# Patient Record
Sex: Female | Born: 1945 | Race: White | Hispanic: No | Marital: Married | Smoking: Former smoker
Health system: Southern US, Community
[De-identification: ages and names within clinical notes are randomized; demographics above are authoritative.]

## PROBLEM LIST (undated history)

## (undated) DIAGNOSIS — I10 Essential (primary) hypertension: Secondary | ICD-10-CM

## (undated) DIAGNOSIS — E119 Type 2 diabetes mellitus without complications: Secondary | ICD-10-CM

## (undated) DIAGNOSIS — IMO0002 Reserved for concepts with insufficient information to code with codable children: Secondary | ICD-10-CM

## (undated) DIAGNOSIS — C801 Malignant (primary) neoplasm, unspecified: Secondary | ICD-10-CM

## (undated) DIAGNOSIS — F329 Major depressive disorder, single episode, unspecified: Secondary | ICD-10-CM

## (undated) DIAGNOSIS — F32A Depression, unspecified: Secondary | ICD-10-CM

## (undated) DIAGNOSIS — E039 Hypothyroidism, unspecified: Secondary | ICD-10-CM

## (undated) DIAGNOSIS — E785 Hyperlipidemia, unspecified: Secondary | ICD-10-CM

## (undated) DIAGNOSIS — H409 Unspecified glaucoma: Secondary | ICD-10-CM

## (undated) DIAGNOSIS — D649 Anemia, unspecified: Secondary | ICD-10-CM

## (undated) HISTORY — PX: CHOLECYSTECTOMY: SHX55

---

## 2003-10-24 IMAGING — MG UNKNOWN MG STUDY
1 series · 4 of 4 positions shown · non-contrast
Comparison: none

REASON FOR EXAM: screening

Procedure: DIGITAL SCREENING MAMMOGRAM WITH CAD:

[R CC · right · 4 of 4 slices shown]
[im 1/4]
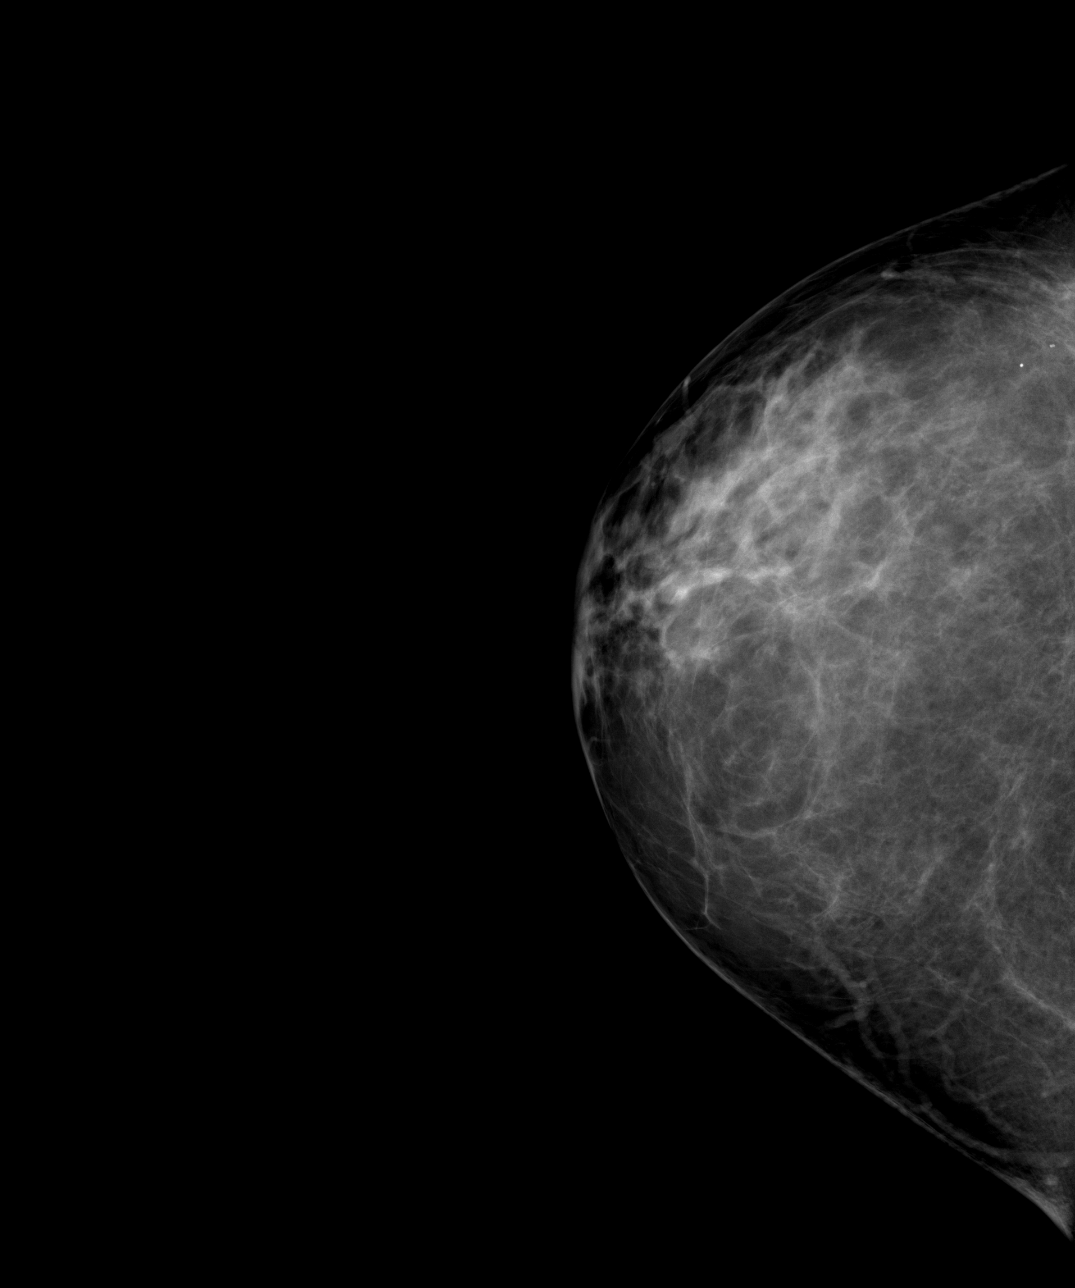
[im 2/4]
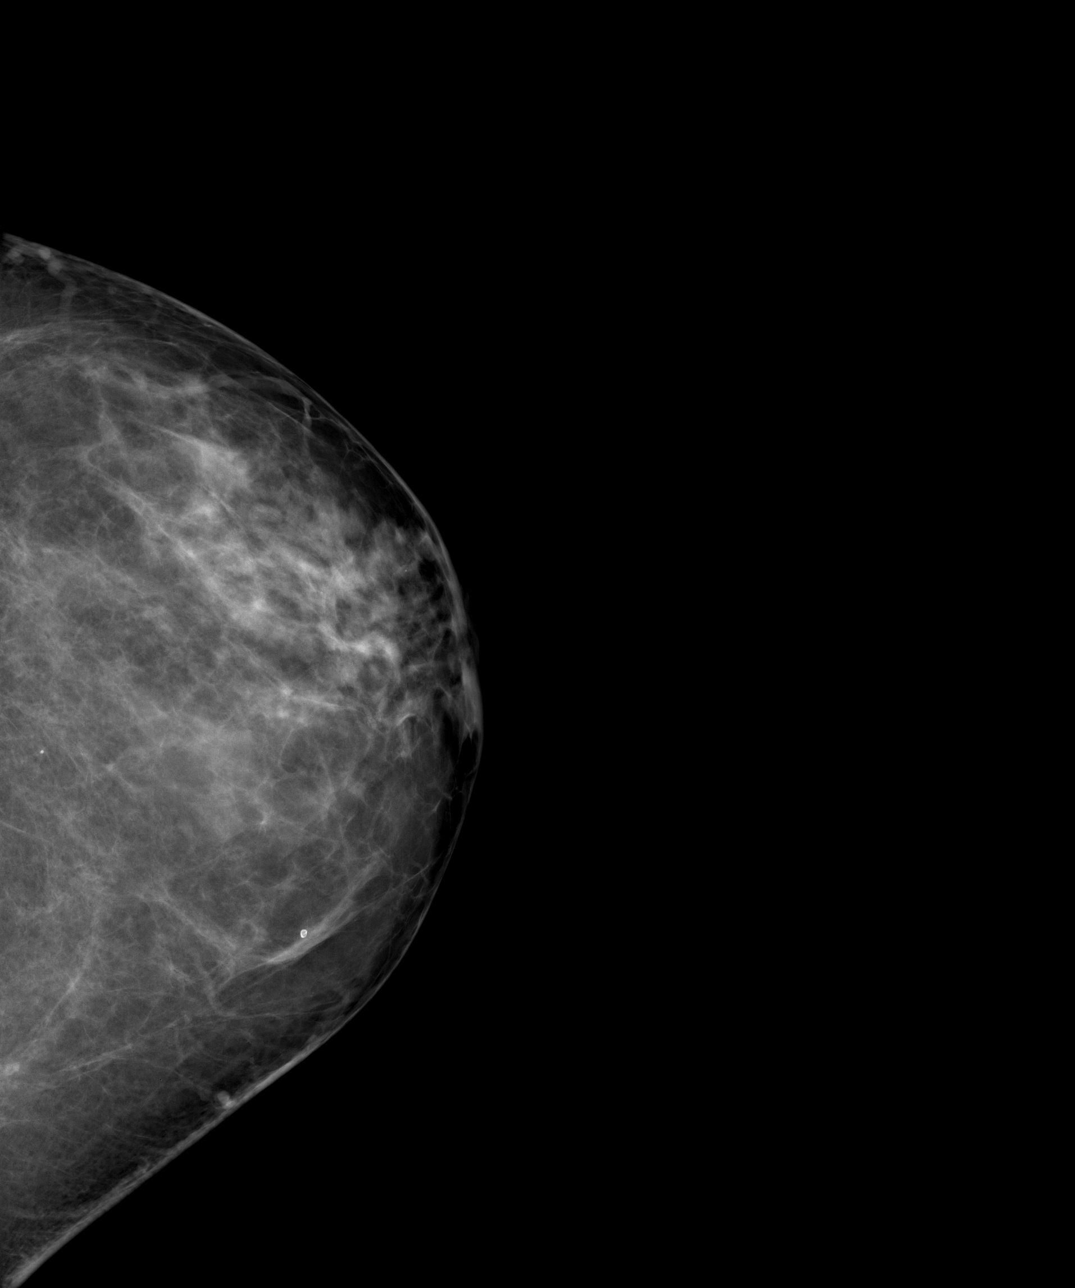
[im 3/4]
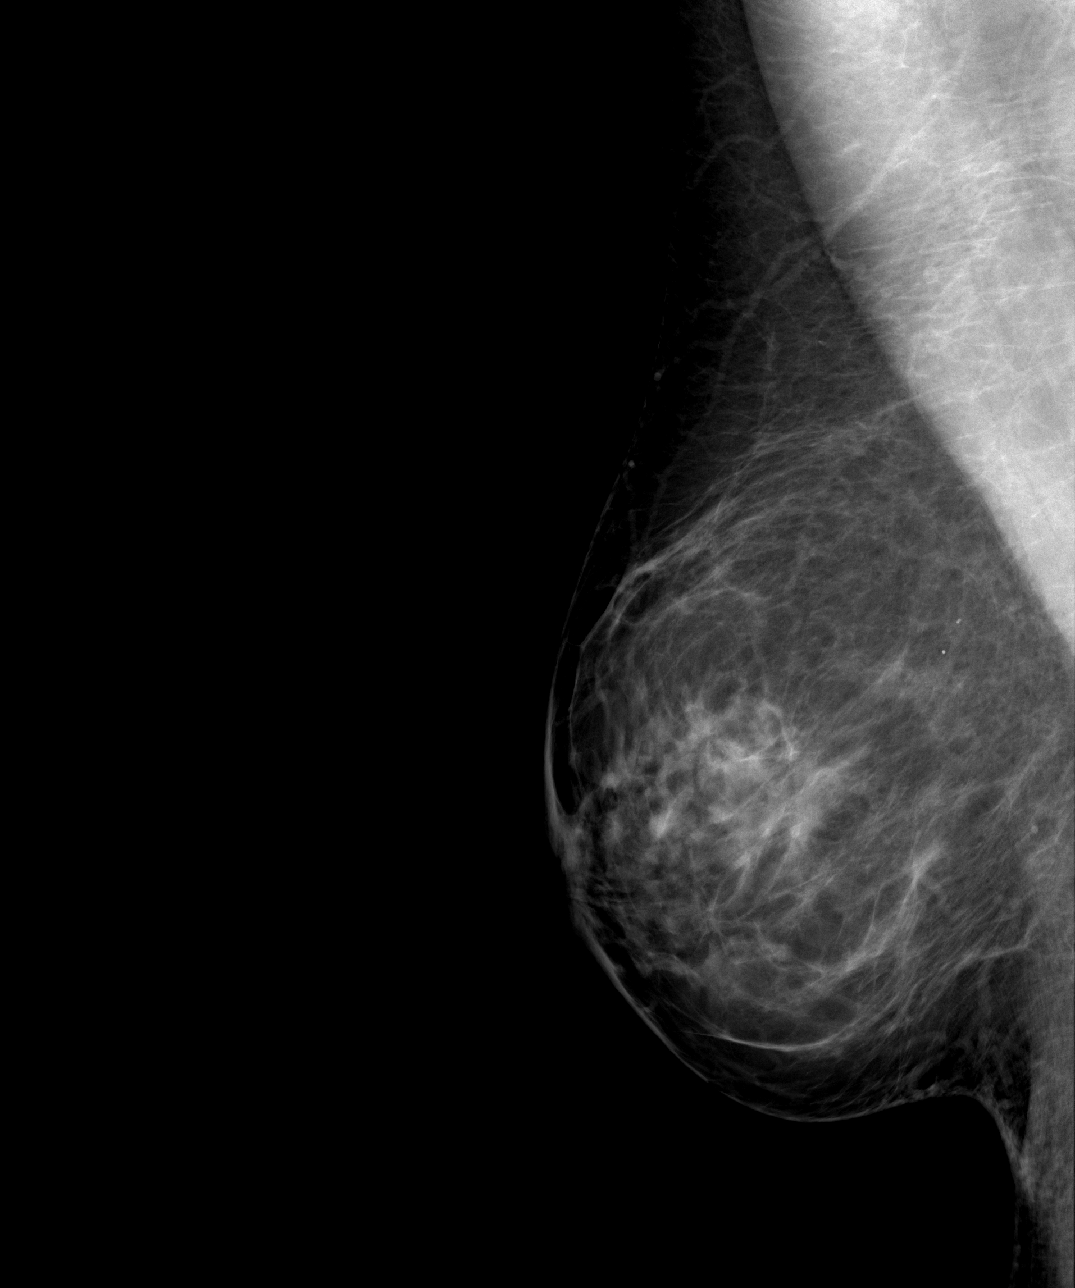
[im 4/4]
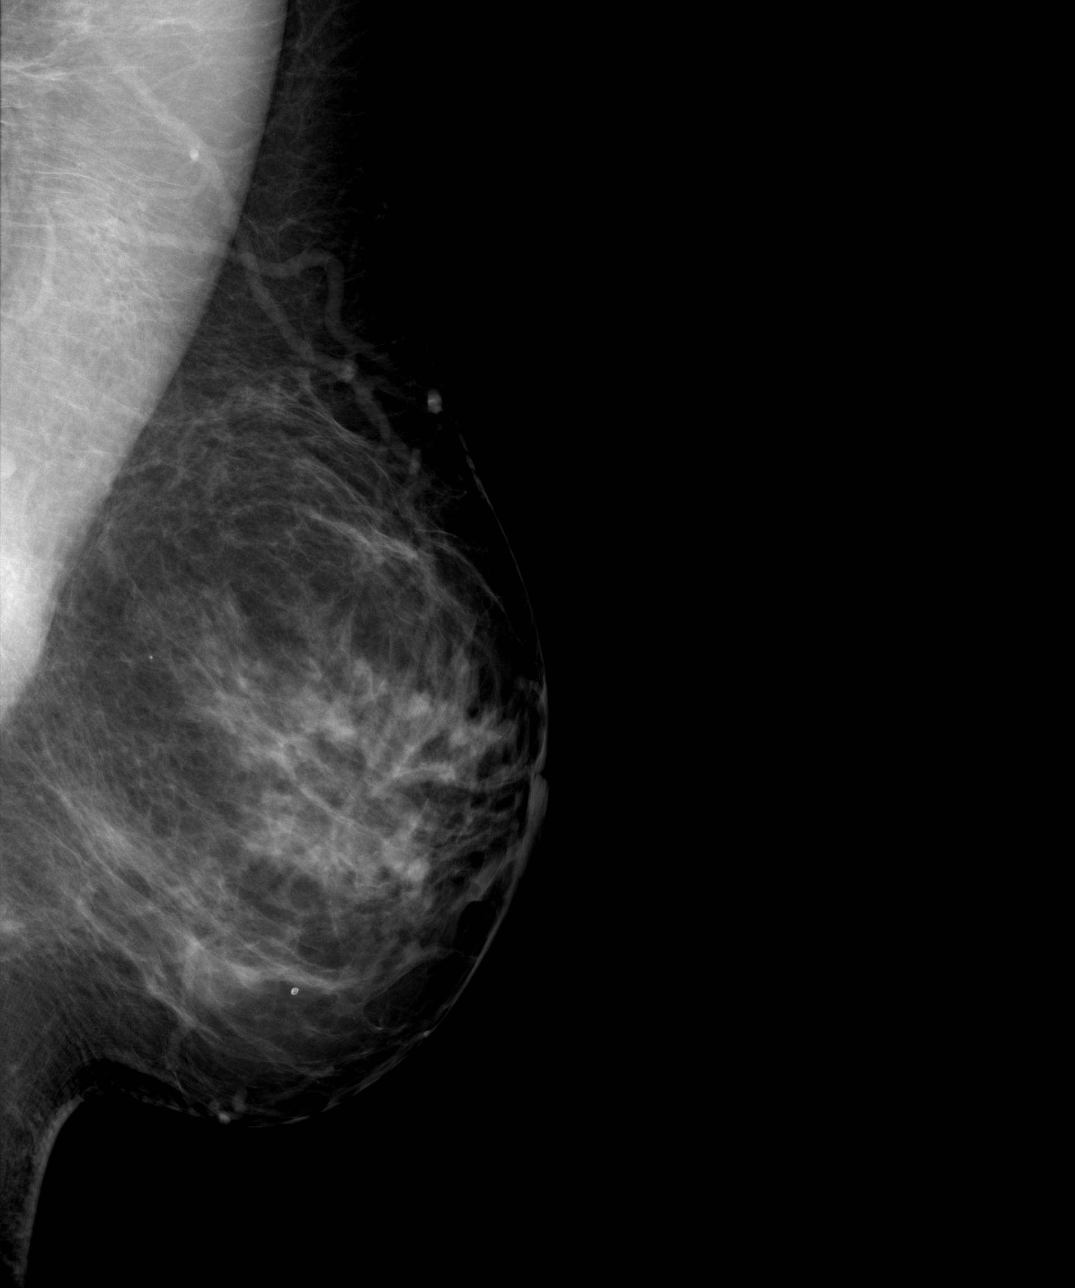

[4 of 4 positions shown; findings below may reference images not displayed]

FINDINGS: Comparison is made to the study of [DATE] and [DATE]. The
breasts exhibit a moderately dense parenchymal pattern. There is no evidence
of a developing density, dominant mass, or malignant appearing
calcification. Stable, benign appearing areas of calcification are present
bilaterally.
IMPRESSION: 1)Stable, benign appearing bilateral mammogram.

 BI-RADS: Category 2-Benign Finding

 RECOMMENDATIONS:

 1)Please continue to encourage yearly mammographic follow-up.

 A NEGATIVE MAMMOGRAM REPORT DOES NOT PRECLUDE BIOPSY OR OTHER EVALUATION OF
A CLINICALLY PALPABLE OR OTHERWISE SUSPICIOUS MASS OR LESION. BREAST CANCER
MAY NOT BE DETECTED BY MAMMOGRAPHY IN UP TO 10% OF CASES.

## 2005-09-02 ENCOUNTER — Ambulatory Visit: Payer: Self-pay | Admitting: Family Medicine

## 2007-08-27 ENCOUNTER — Ambulatory Visit: Payer: Self-pay | Admitting: *Deleted

## 2007-09-03 ENCOUNTER — Ambulatory Visit: Payer: Self-pay | Admitting: *Deleted

## 2007-09-10 ENCOUNTER — Ambulatory Visit: Payer: Self-pay | Admitting: *Deleted

## 2007-09-15 ENCOUNTER — Ambulatory Visit: Payer: Self-pay | Admitting: Psychology

## 2007-09-22 ENCOUNTER — Ambulatory Visit: Payer: Self-pay | Admitting: *Deleted

## 2007-10-01 ENCOUNTER — Ambulatory Visit: Payer: Self-pay | Admitting: *Deleted

## 2007-10-06 ENCOUNTER — Ambulatory Visit: Payer: Self-pay | Admitting: *Deleted

## 2009-06-06 ENCOUNTER — Ambulatory Visit: Payer: Self-pay | Admitting: Family Medicine

## 2009-08-02 ENCOUNTER — Ambulatory Visit: Payer: Self-pay | Admitting: Gastroenterology

## 2011-03-20 ENCOUNTER — Ambulatory Visit: Payer: Self-pay | Admitting: Family Medicine

## 2011-03-20 IMAGING — MG MAM DGTL SCRN MAM NO ORDER W/CAD
1 series · 4 of 4 positions shown · non-contrast
Comparison: none

REASON FOR EXAM: scr mammo no order
COMMENTS:

[Series 9392: R CC · right · 4 of 4 slices shown]
[im 1/4]
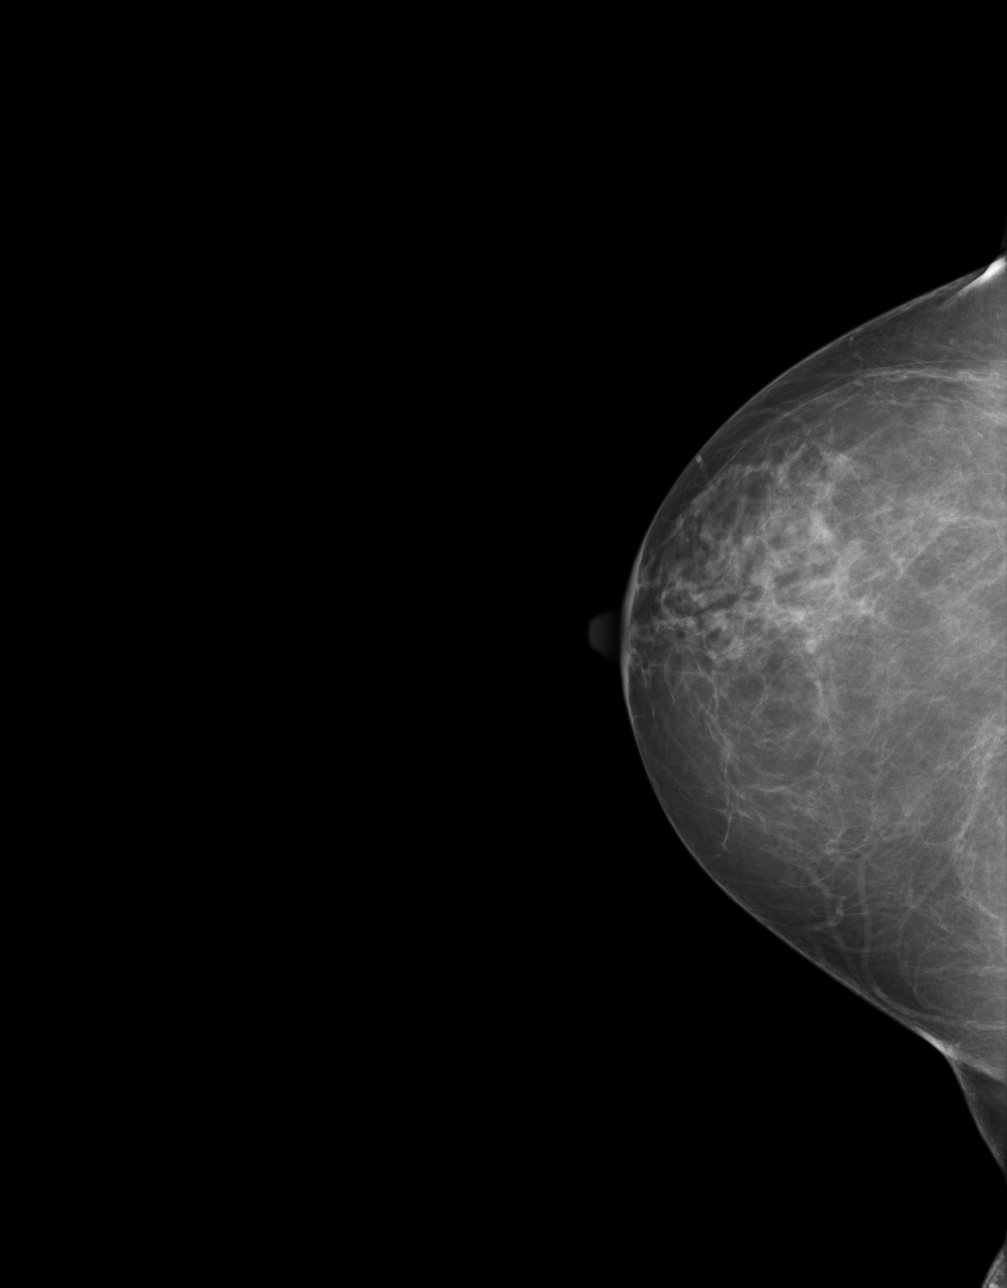
[im 2/4]
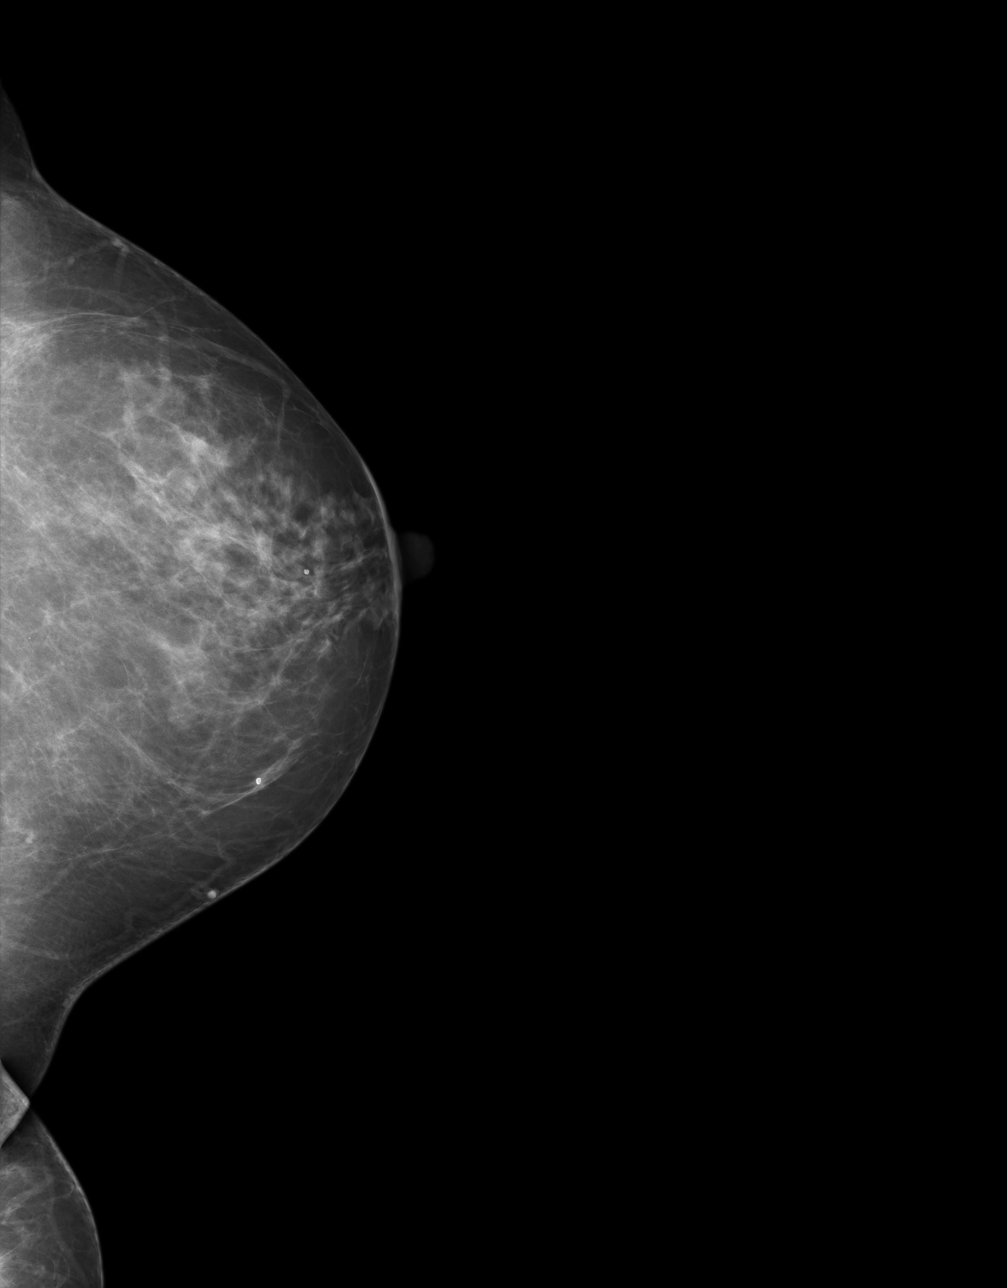
[im 3/4]
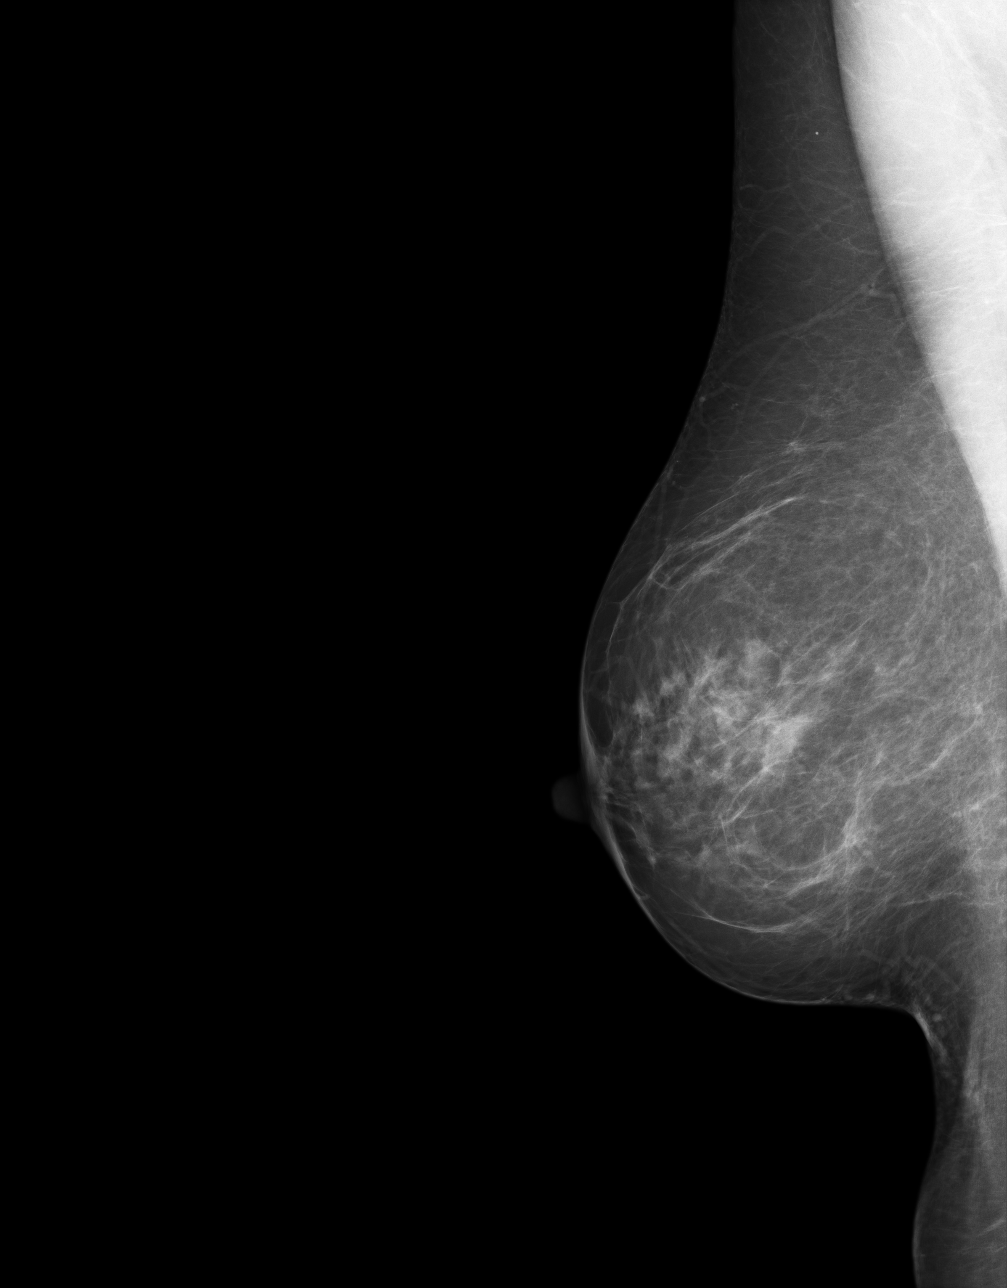
[im 4/4]
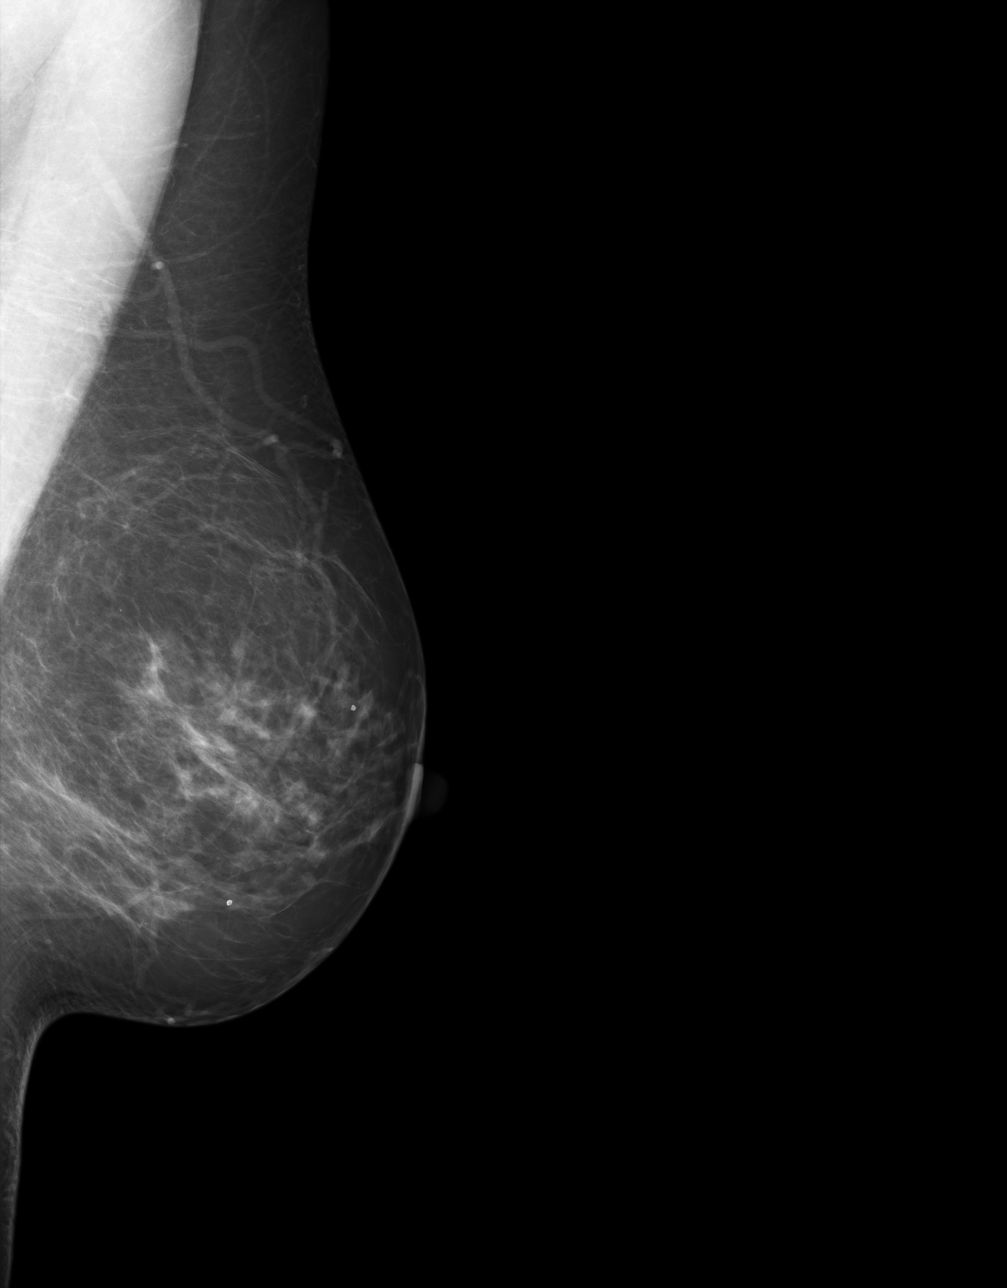

[4 of 4 positions shown; findings below may reference images not displayed]

PROCEDURE:     MAM - MAM DGTL SCRN MAM NO ORDER W/CAD  - [DATE]  [DATE]

RESULT:     Comparison is made to study [DATE],[DATE],
and [DATE].

The breasts exhibit a scattered moderately dense parenchymal pattern. There
is no dominant mass. There are no malignant appearing groupings of
microcalcification. A stable 2 mm diameter microcalcification is present on
the left in the periareolar region.
IMPRESSION: 1.I do not see findings suspicious for malignancy.

BI-RADS: Category 2 - Benign Findings

RECOMMENDATIONS:

1.     Please continue to encourage yearly mammographic follow-up.

A NEGATIVE MAMMOGRAM REPORT DOES NOT PRECLUDE BIOPSY OR OTHER EVALUATION OF
A CLINICALLY PALPABLE OR OTHERWISE SUSPICIOUS MASS OR LESION. BREAST CANCER
MAY NOT BE DETECTED BY MAMMOGRAPHY IN UP TO 10% OF CASES.

## 2012-03-18 ENCOUNTER — Ambulatory Visit: Payer: Self-pay | Admitting: Ophthalmology

## 2012-03-18 DIAGNOSIS — I10 Essential (primary) hypertension: Secondary | ICD-10-CM

## 2012-03-30 ENCOUNTER — Ambulatory Visit: Payer: Self-pay | Admitting: Ophthalmology

## 2012-04-01 ENCOUNTER — Ambulatory Visit: Payer: Self-pay | Admitting: Family Medicine

## 2012-04-01 IMAGING — MG MM CAD SCREENING MAMMO
1 series · 4 of 4 positions shown · non-contrast
Comparison: none

REASON FOR EXAM: SCR MAMMO NO ORDER
COMMENTS:

[R CC · right · 4 of 4 slices shown]
[im 1/4]
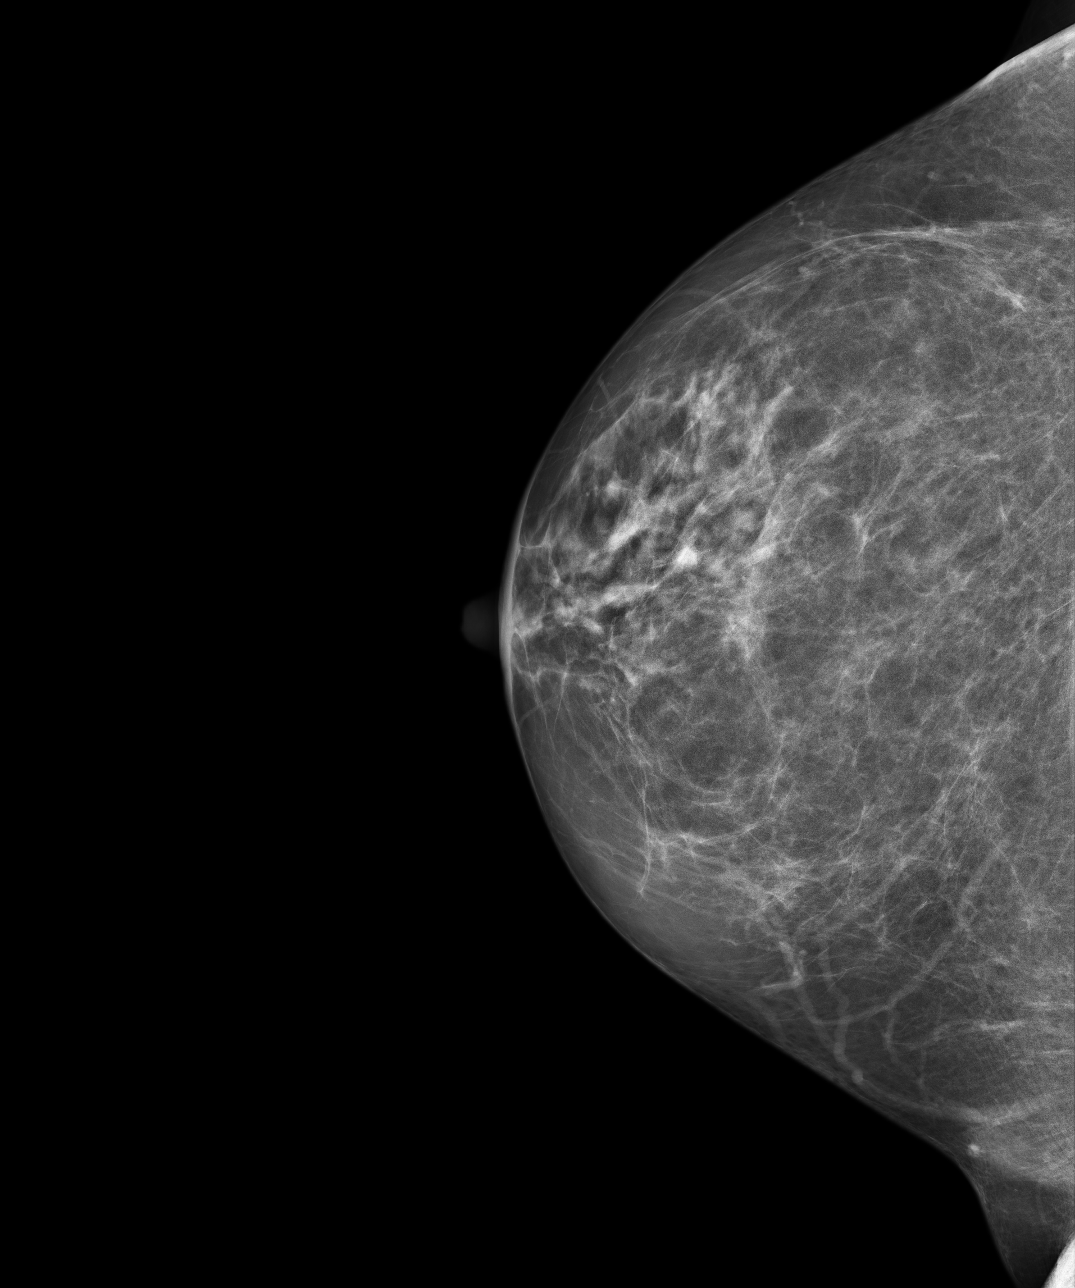
[im 2/4]
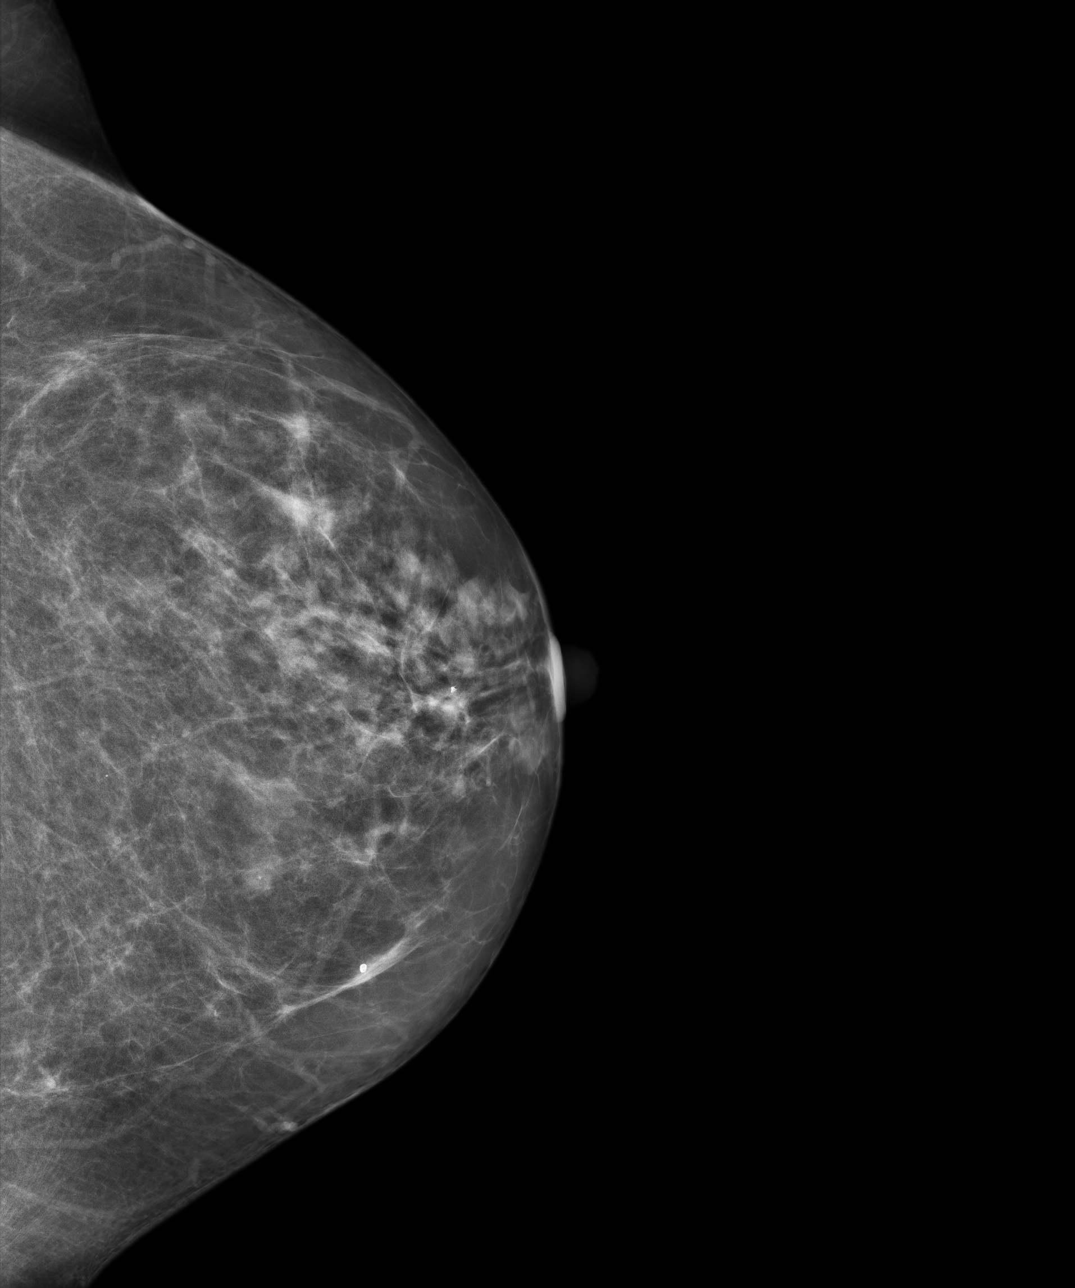
[im 3/4]
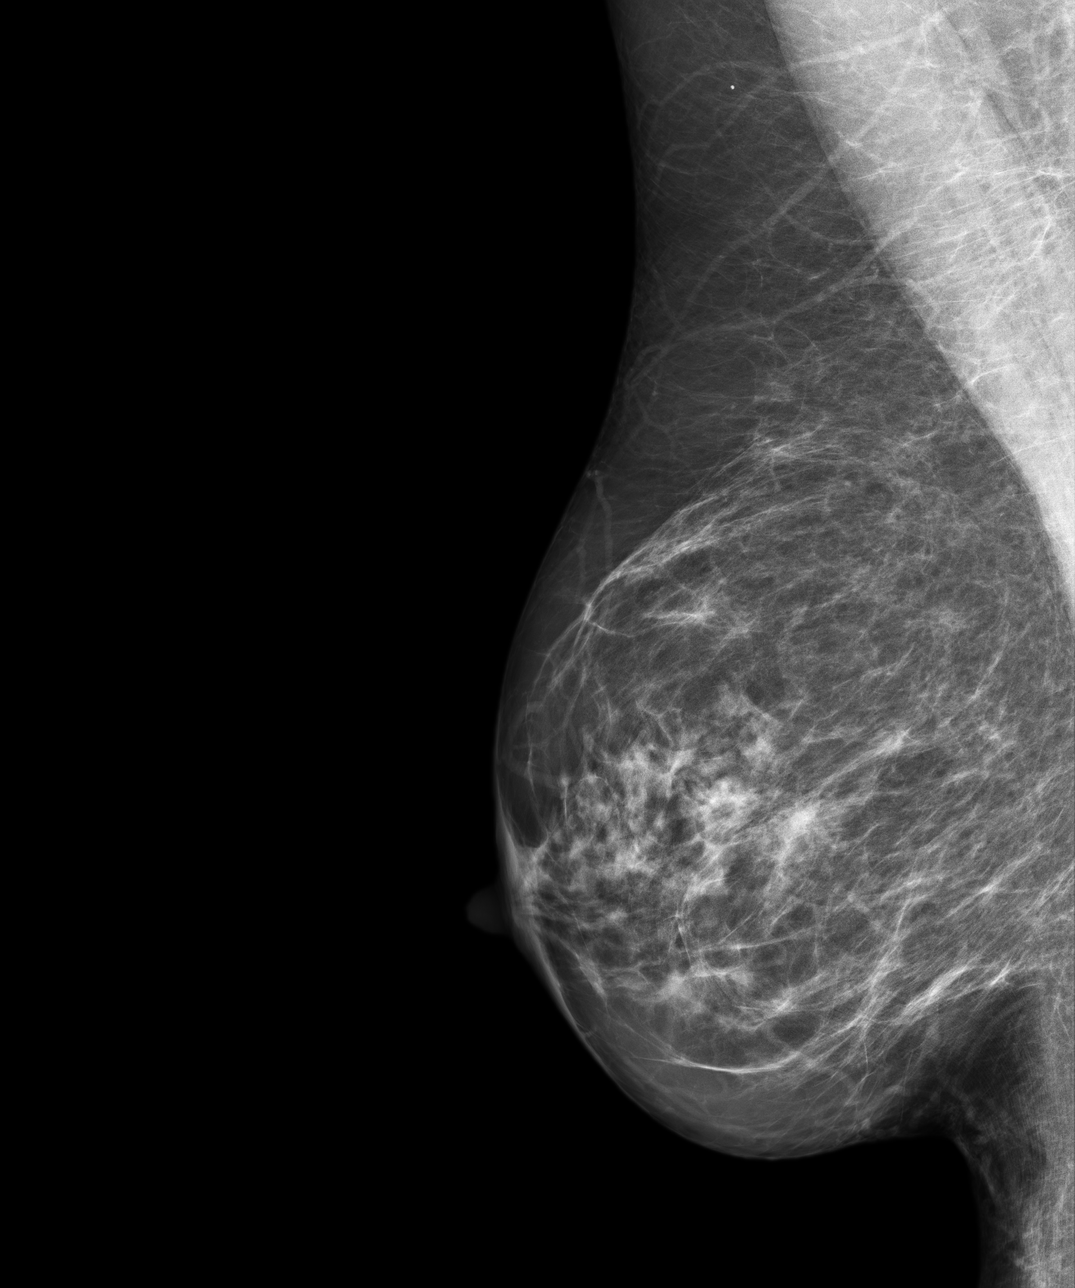
[im 4/4]
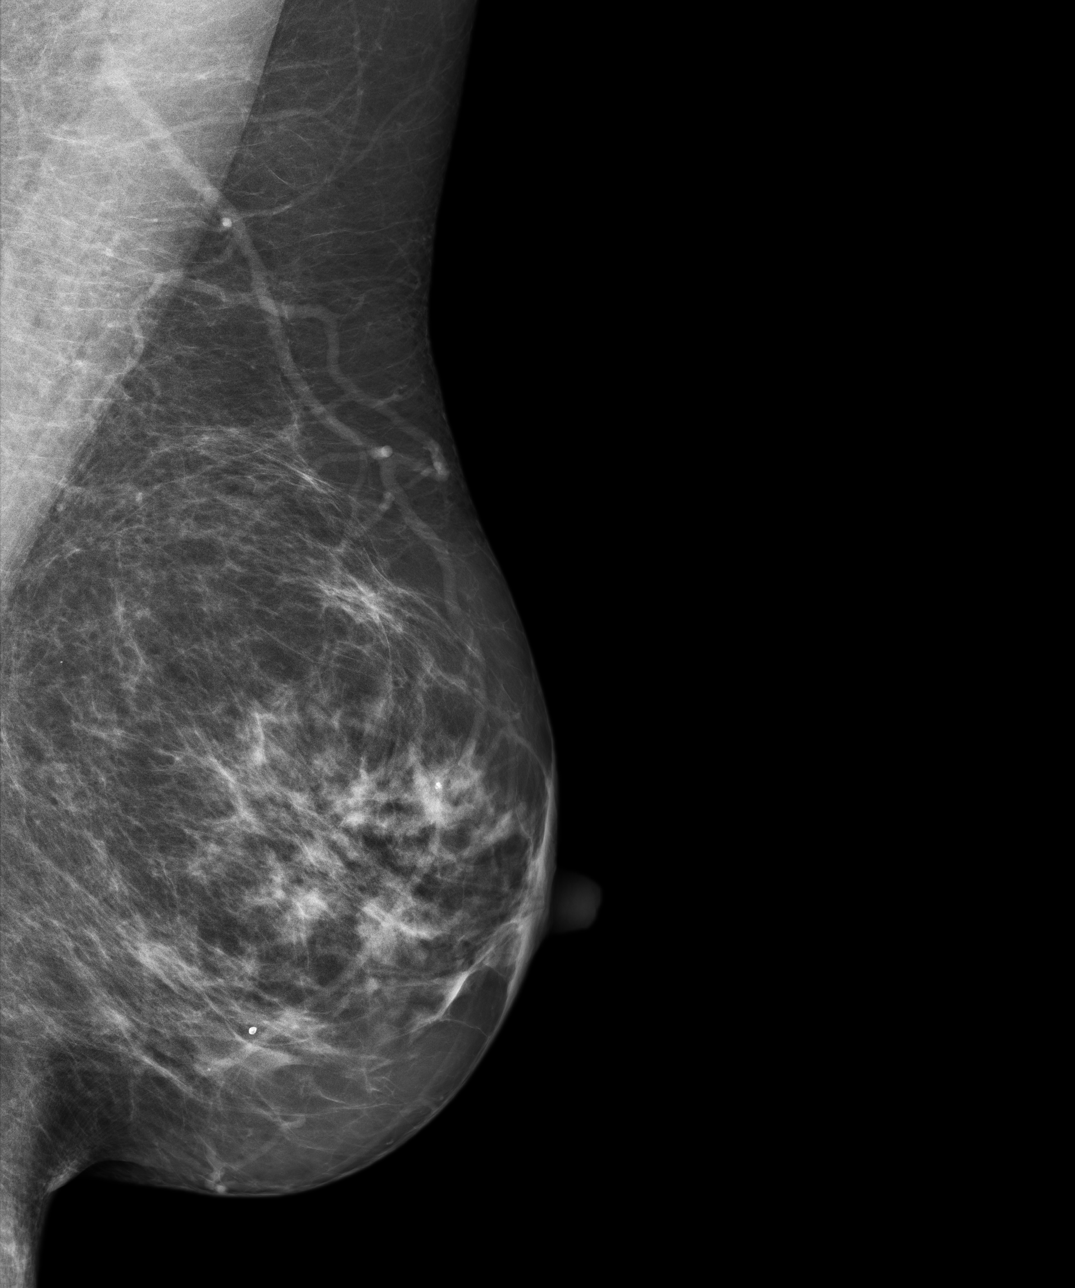

[4 of 4 positions shown; findings below may reference images not displayed]

PROCEDURE:     MAM - MAM DGTL SCRN MAM NO ORDER W/CAD  - [DATE]  [DATE]

RESULT:     Small parenchymal density with questionable calcification in the
medial most likely inferior aspect of the left breast. Magnification spot
films and if need be ultrasound suggested for further evaluation. Benign
calcifications noted elsewhere.
IMPRESSION: Small nodular density with questionable calcification in
the inferior medial and most likely medial portion left breast for which
compression spot films and if need be ultrasound suggest for further
evaluation.

BI-RADS: Catagory 0 - Need Additional Imaging Evaluation

A NEGATIVE MAMMOGRAM REPORT DOES NOT PRECLUDE BIOPSY OR OTHER EVALUATION OF
A CLINICALLY PALPABLE OR OTHERWISE SUSPICIOUS MASS OR LESION. BREAST CANCER
MAY NOT BE DETECTED IN UP TO 10% OF CASES.

## 2012-04-12 ENCOUNTER — Ambulatory Visit: Payer: Self-pay | Admitting: Family Medicine

## 2012-04-14 ENCOUNTER — Ambulatory Visit: Payer: Self-pay | Admitting: Family Medicine

## 2012-04-14 IMAGING — MG MM ADDITIONAL VIEWS AT NO CHARGE
1 series · 4 of 4 positions shown · non-contrast
Comparison: none

REASON FOR EXAM: av lt nodular density [facility] calcs
COMMENTS:

[L ML · left · 4 of 4 slices shown]
[im 1/4]
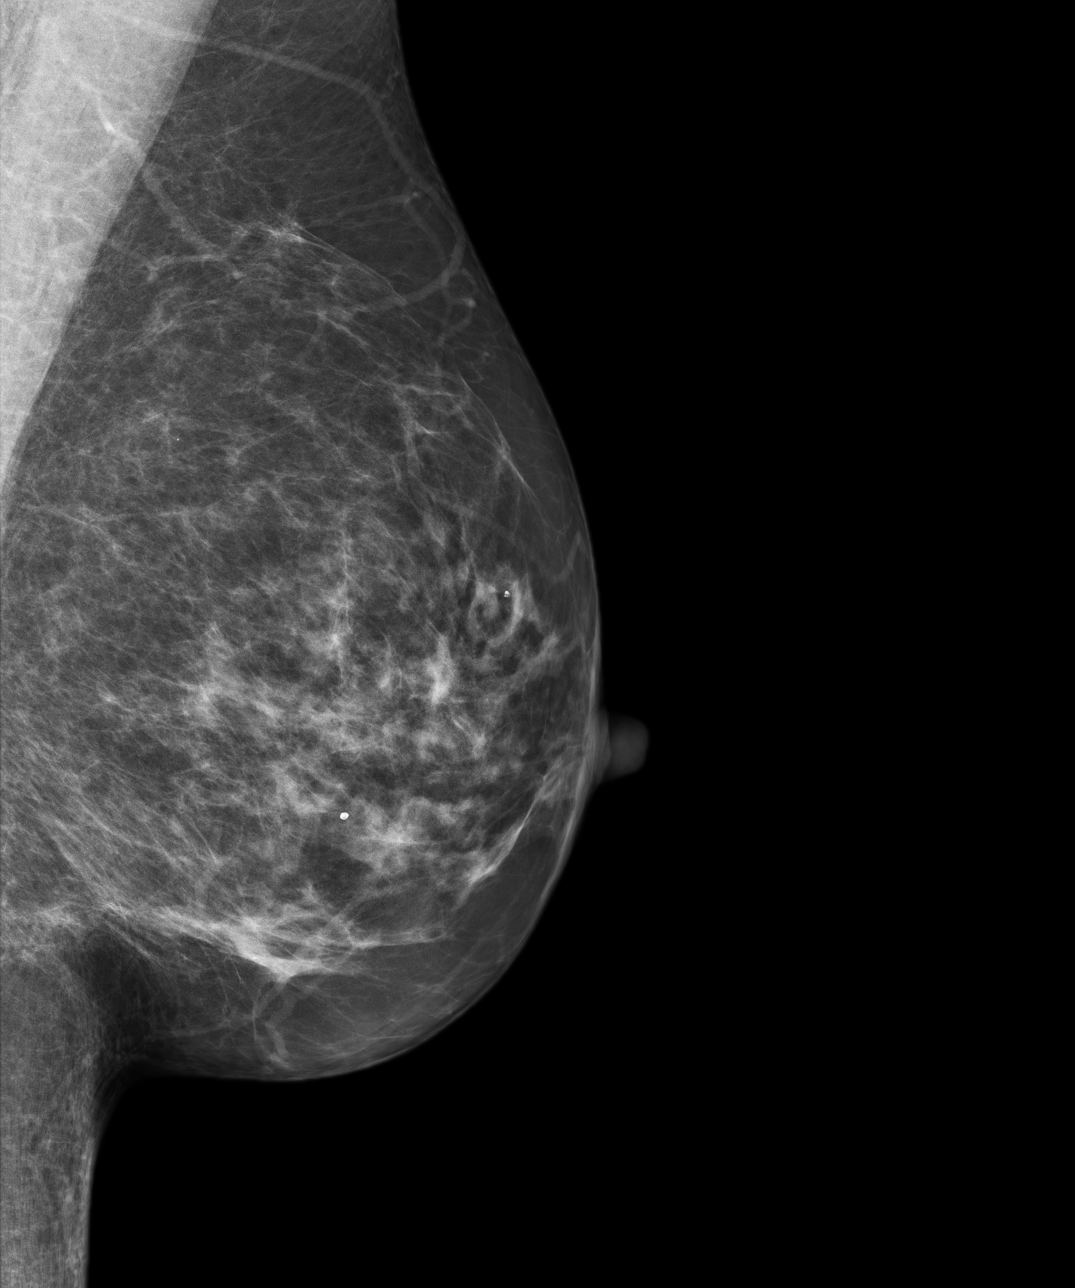
[im 2/4]
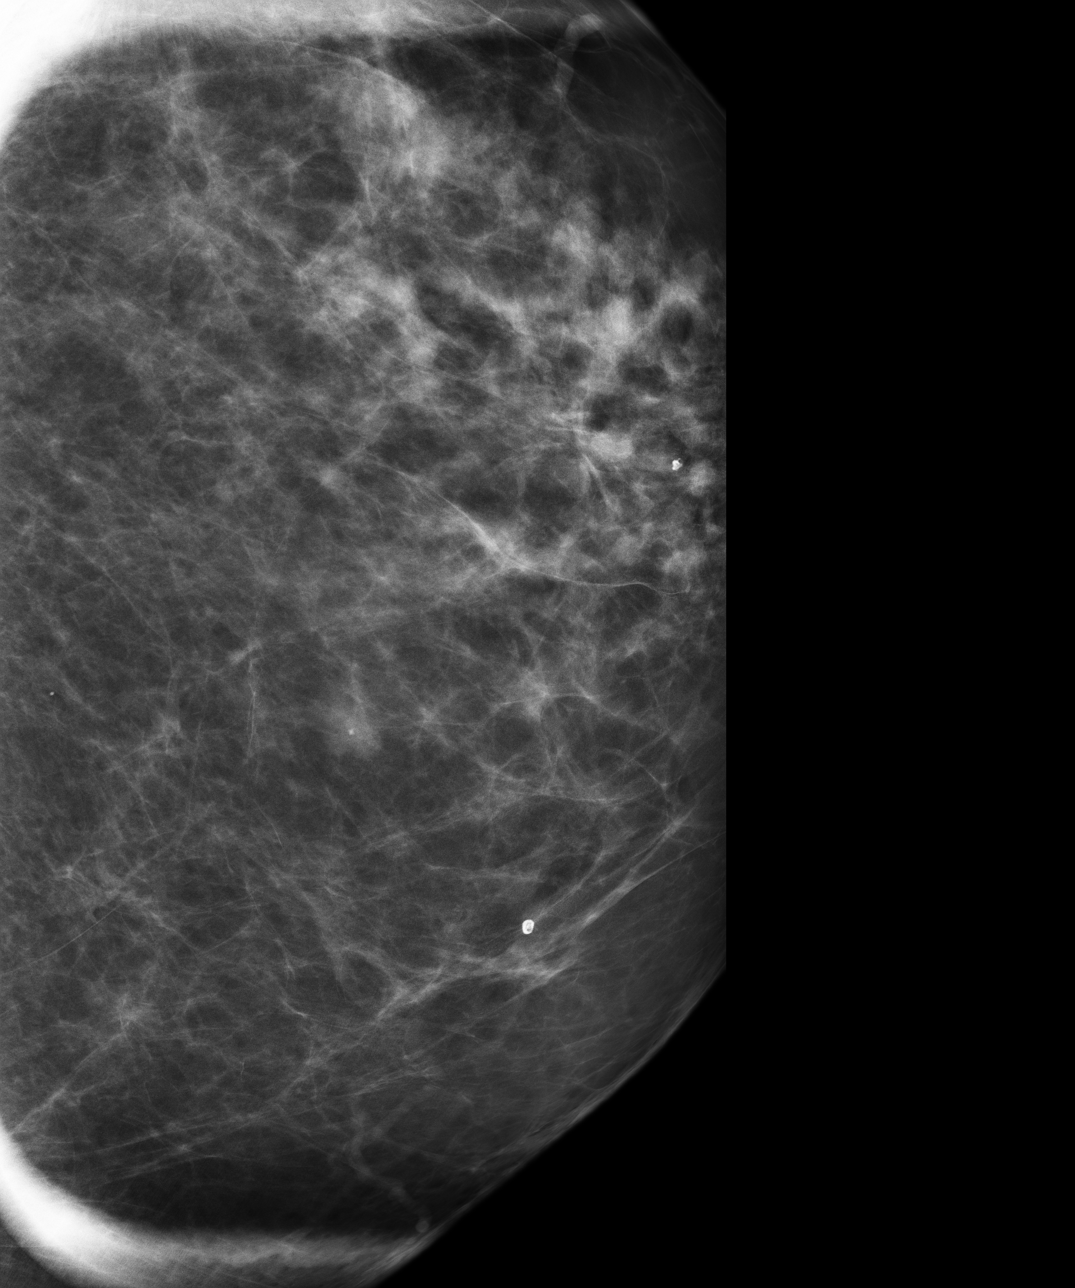
[im 3/4]
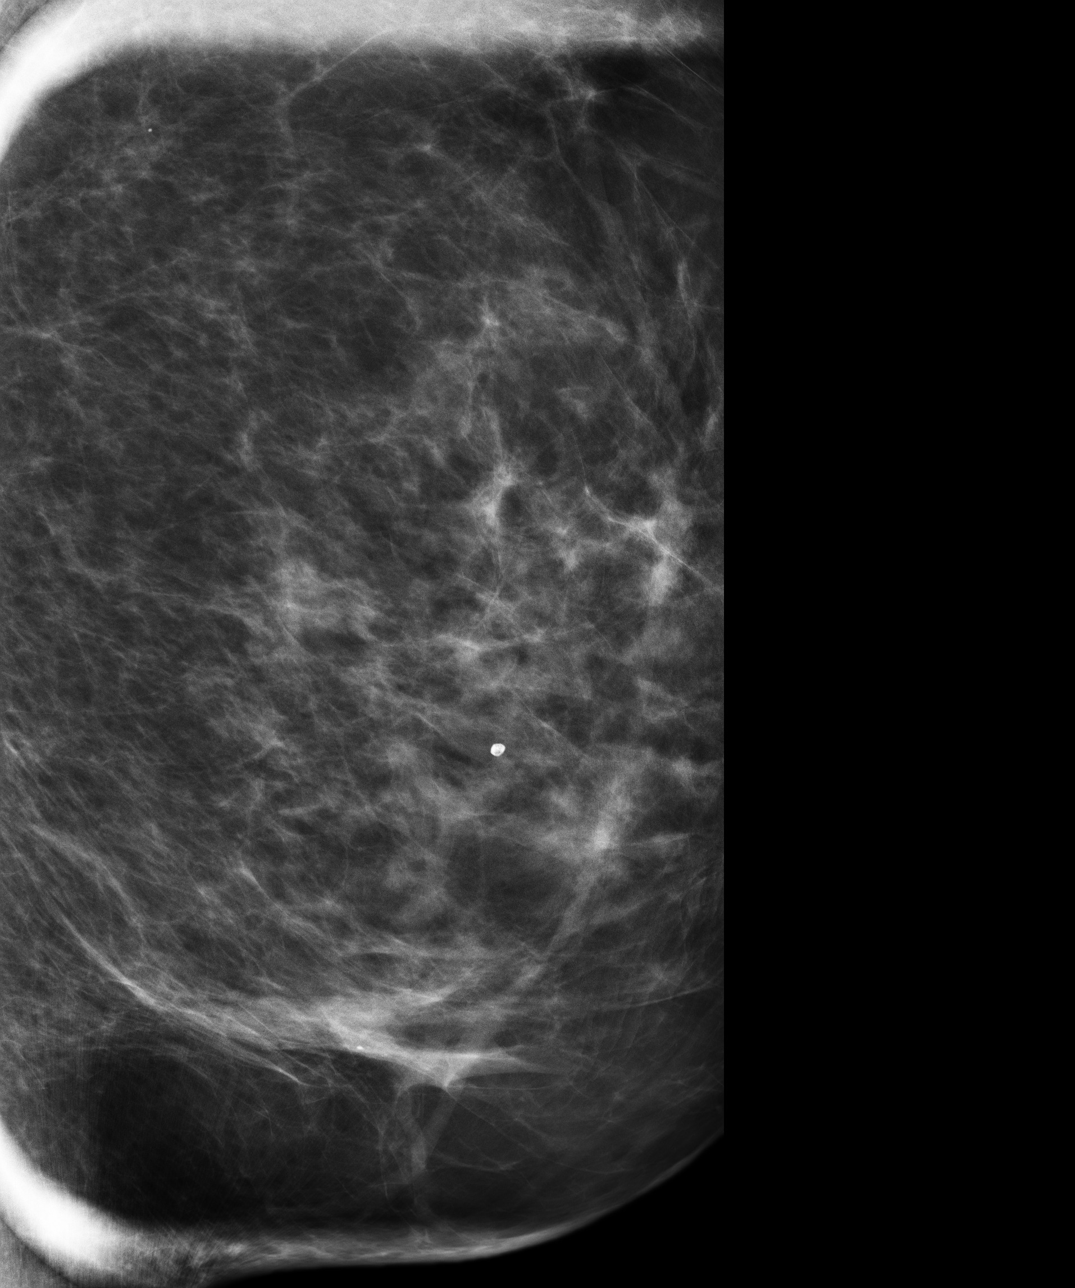
[im 4/4]
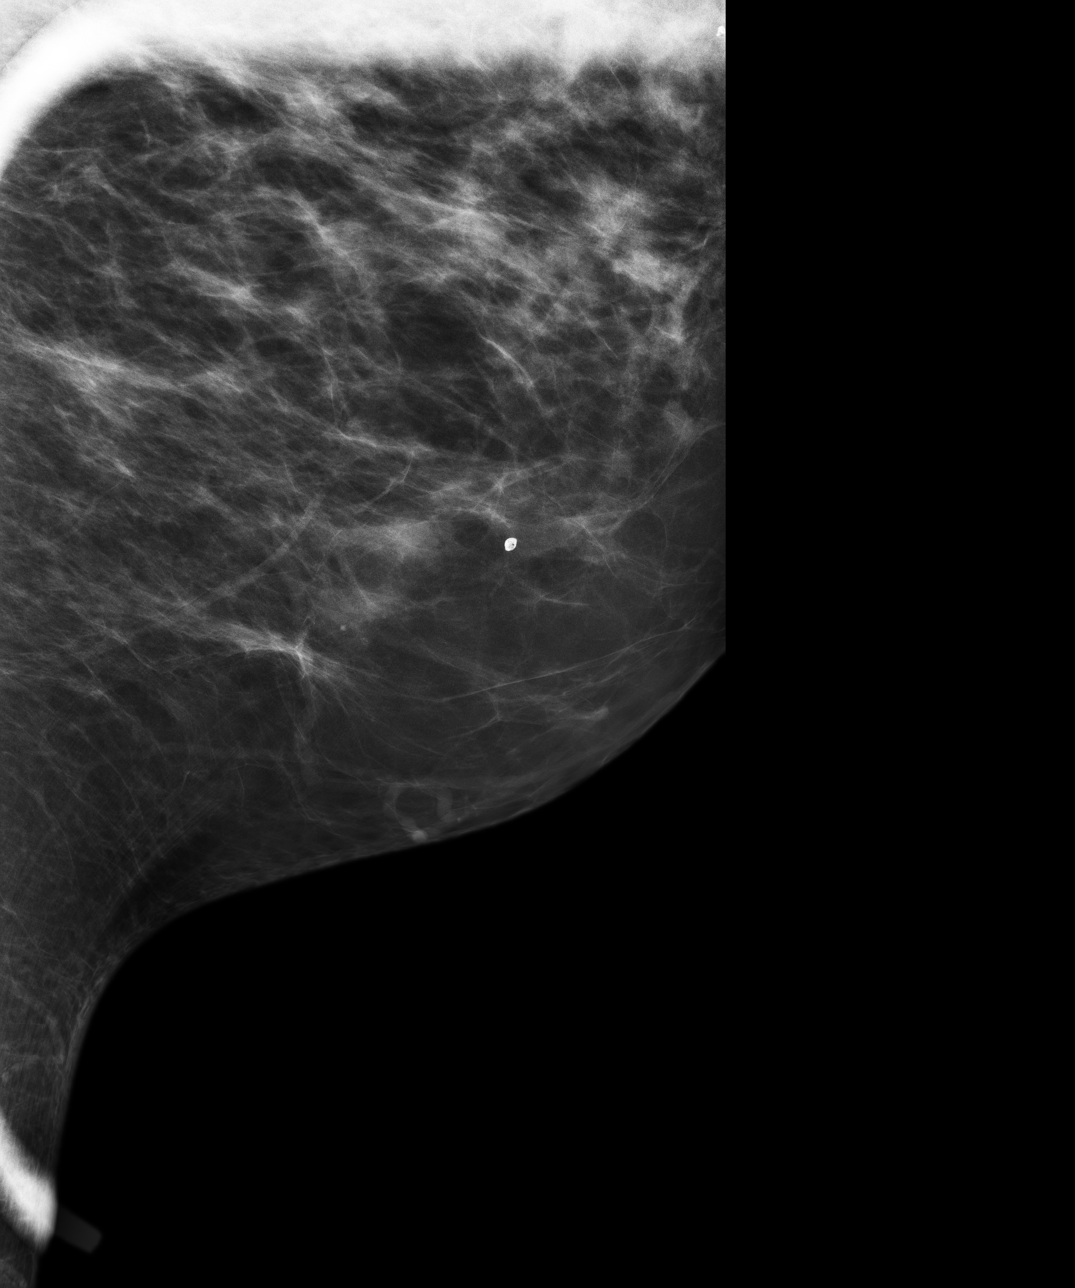

[4 of 4 positions shown; findings below may reference images not displayed]

PROCEDURE:     MAM - MAM DGTL ADD VW LT  SCR  - [DATE]  [DATE]

RESULT:     Nodular density containing calcifications is noted in medial
most likely inferior portion of the left breast. Ultrasound reveals a
hypoechoic nodule containing calcifications. Ultrasound directed needle
localization for surgical removal is suggested for further evaluation.
IMPRESSION: BI-RADS: Category 4 - Suspicious Abnormality - Biopsy
Should Be Considered

A NEGATIVE MAMMOGRAM REPORT DOES NOT PRECLUDE BIOPSY OR OTHER EVALUATION OF
A CLINICALLY PALPABLE OR OTHERWISE SUSPICIOUS MASS OR LESION. BREAST CANCER
MAY NOT BE DETECTED IN UP TO 10% OF CASES.

## 2012-04-14 IMAGING — US ULTRASOUND LEFT BREAST
1 series · 14 of 25 positions shown · non-contrast
Comparison: none

REASON FOR EXAM: av lt nodular density [facility] calcs
COMMENTS:

[Series 1: ultrasound left breast · 0.08mm/px · 14 of 30 slices shown]
[im 1/30]
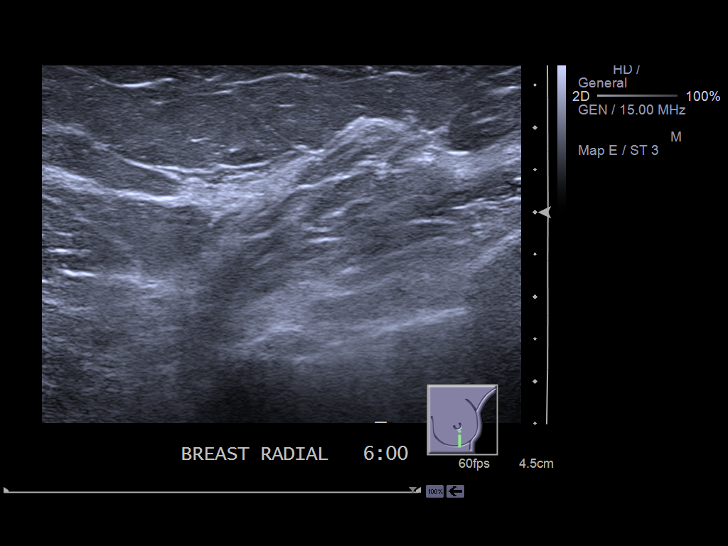
[im 3/30]
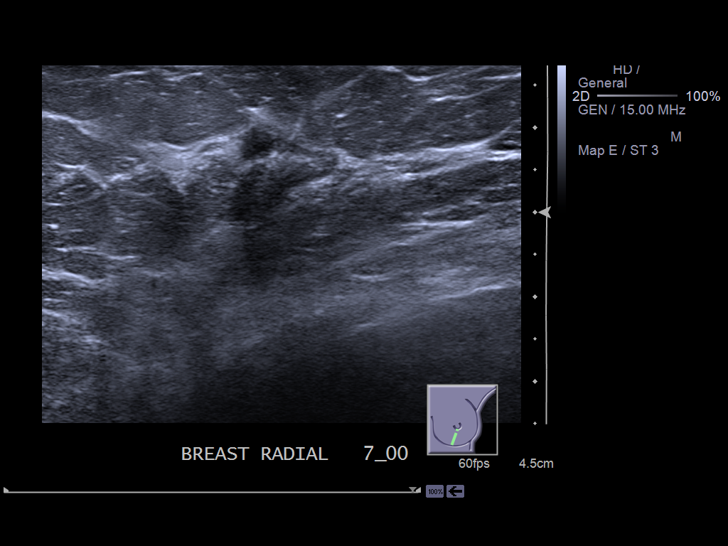
[im 5/30]
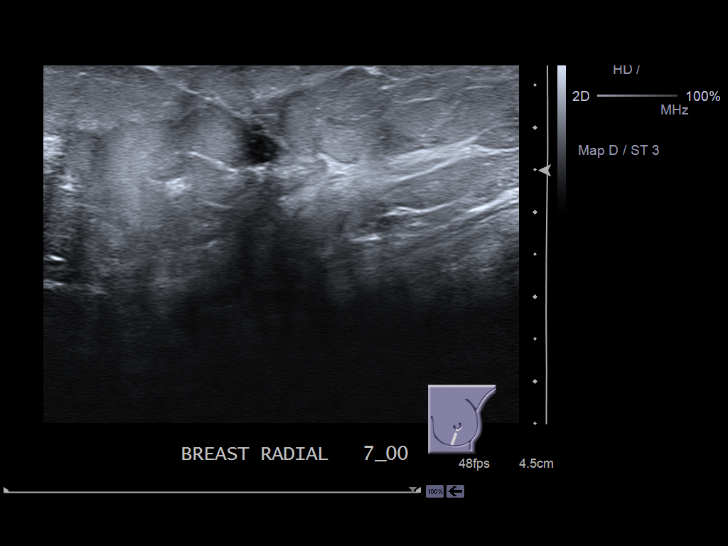
[im 8/30]
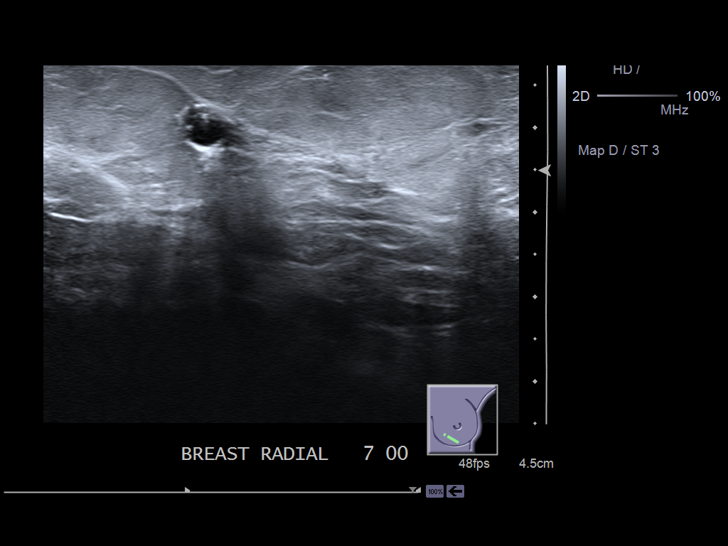
[im 10/30]
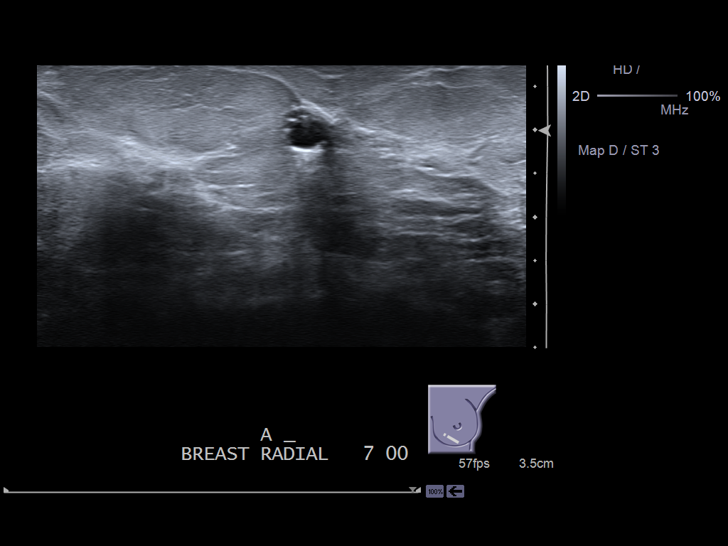
[im 11/30]
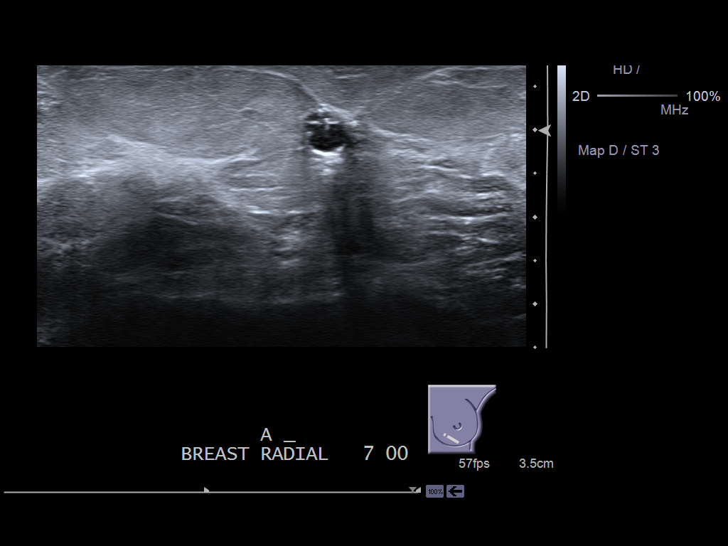
[im 14/30]
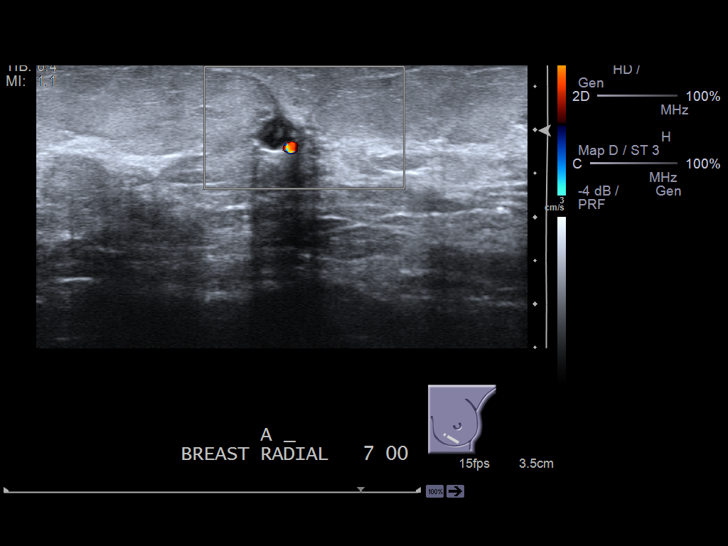
[im 16/30]
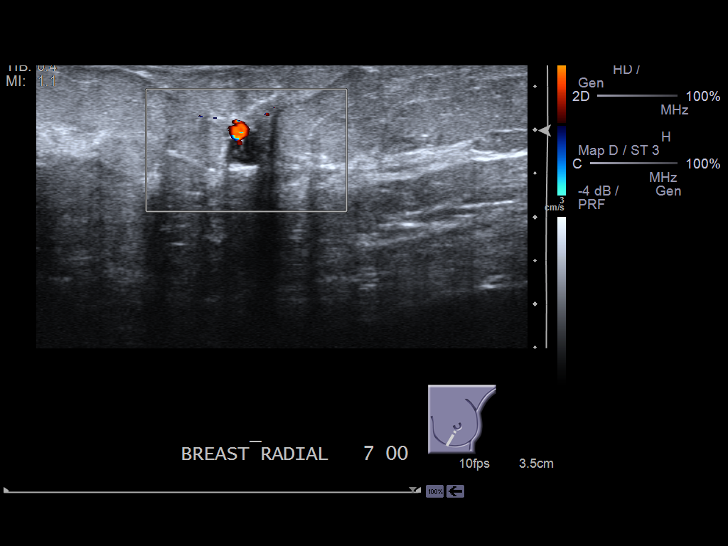
[im 19/30]
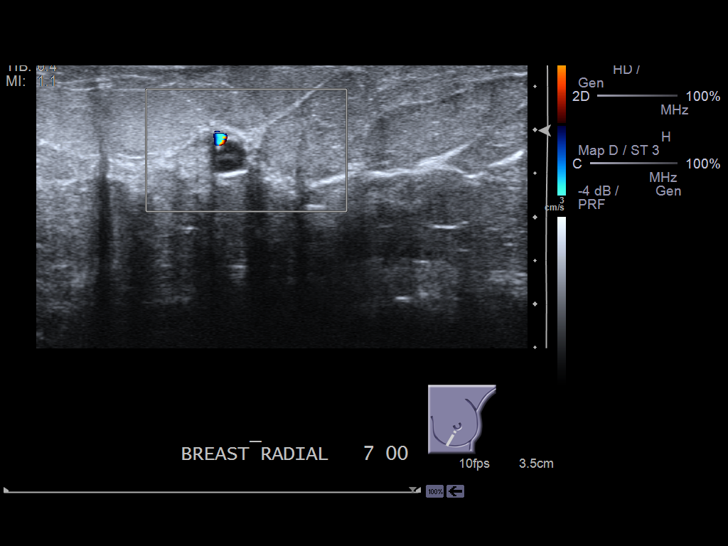
[im 20/30]
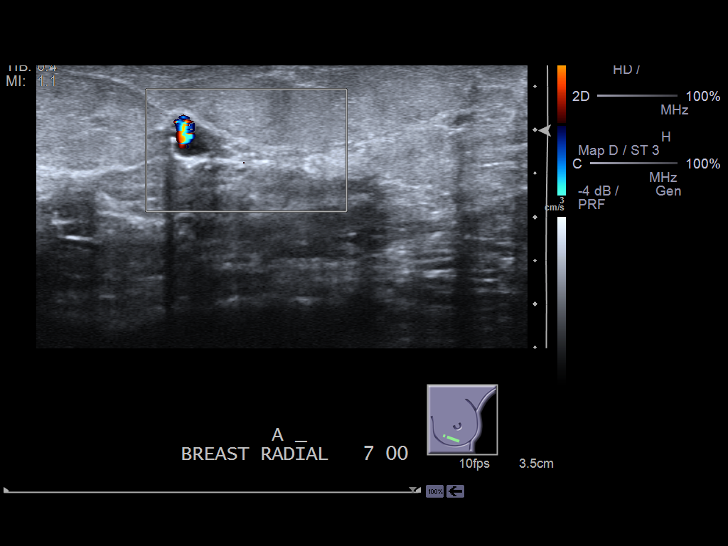
[im 22/30]
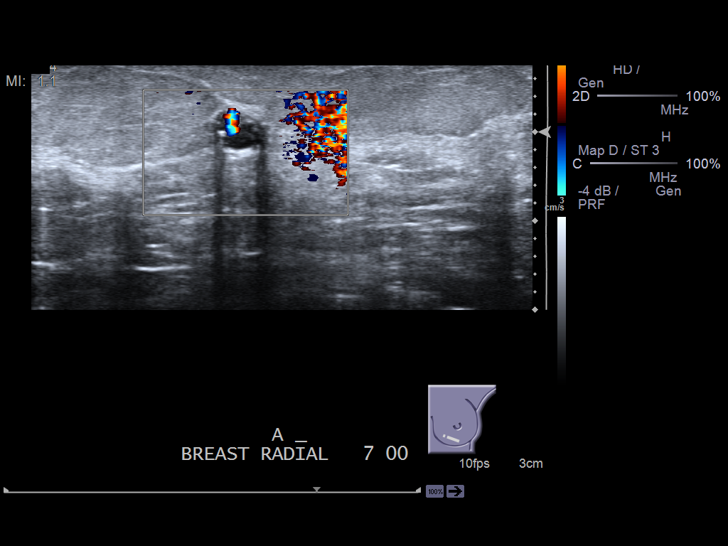
[im 25/30]
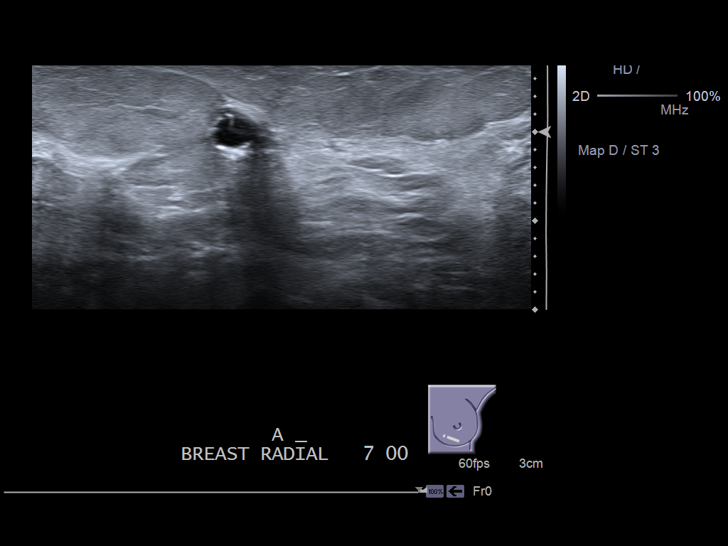
[im 27/30]
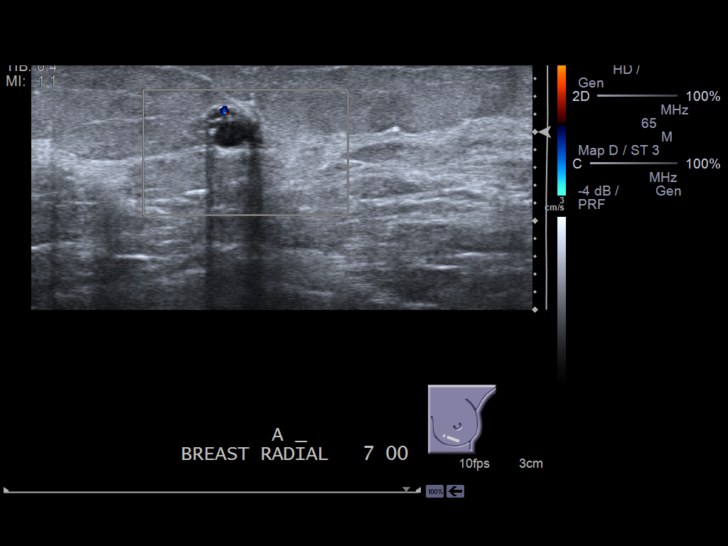
[im 30/30]
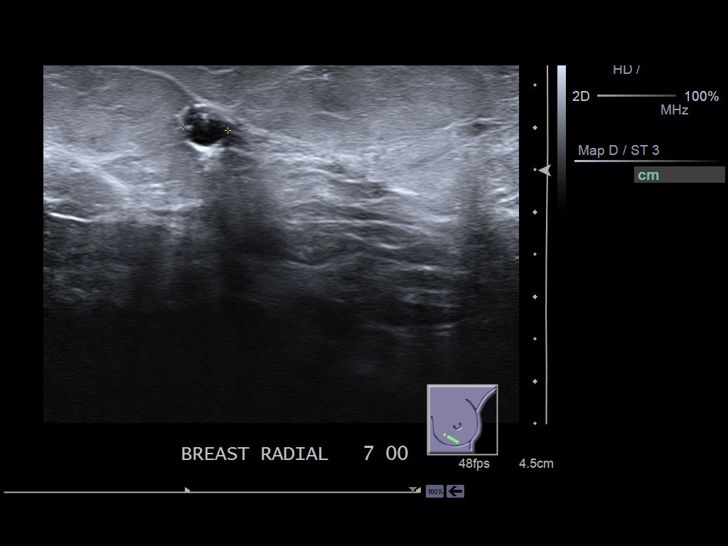

[14 of 25 positions shown; findings below may reference images not displayed]

PROCEDURE:     US  - US BREAST LEFT  - [DATE]  [DATE]

RESULT:     Ultrasound left breast reveals a small 5 mm hypoechoic nodule at
[DATE] by ultrasound. This correspond to mammographic abnormality. There are
calcifications within and or adjacent to the lesion. Ultrasound directed
needle localization is suggested for further evaluation to exclude
malignancy.
IMPRESSION: BI-RADS: Category 4 - Suspicious Abnormality - Biopsy Should Be Considered

A NEGATIVE MAMMOGRAM REPORT DOES NOT PRECLUDE BIOPSY OR OTHER EVALUATION OF
A CLINICALLY PALPABLE OR OTHERWISE SUSPICIOUS MASS OR LESION. BREAST CANCER
MAY NOT BE DETECTED IN UP TO 10% OF CASES.

## 2012-05-03 ENCOUNTER — Ambulatory Visit: Payer: Self-pay | Admitting: Ophthalmology

## 2012-07-10 HISTORY — PX: BREAST EXCISIONAL BIOPSY: SUR124

## 2012-07-13 ENCOUNTER — Ambulatory Visit: Payer: Self-pay | Admitting: Surgery

## 2012-07-13 IMAGING — US ULTRASOUND LEFT BREAST
1 series · 14 of 21 positions shown · non-contrast
Comparison: none

REASON FOR EXAM: LT BRST NODULAR DENSITY FU
COMMENTS:

PROCEDURE:     US  - US BREAST LEFT  - [DATE]  [DATE]
RESULT:     Focused left breast ultrasound dated [DATE]. This study was
presented for evaluation on [DATE].

[Series 1: ultrasound left breast · 0.05mm/px · 14 of 21 slices shown]
[im 1/21]
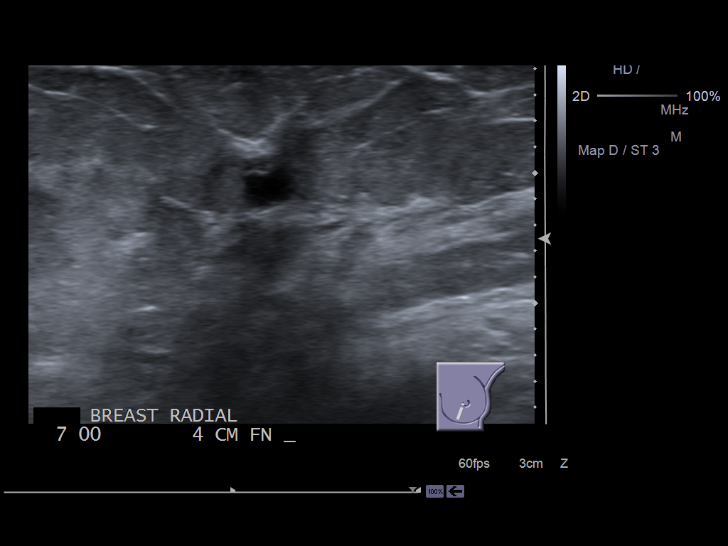
[im 3/21]
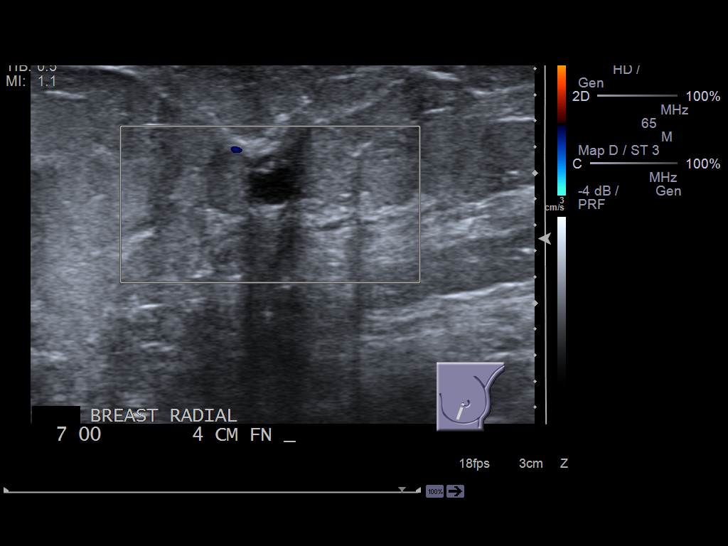
[im 4/21]
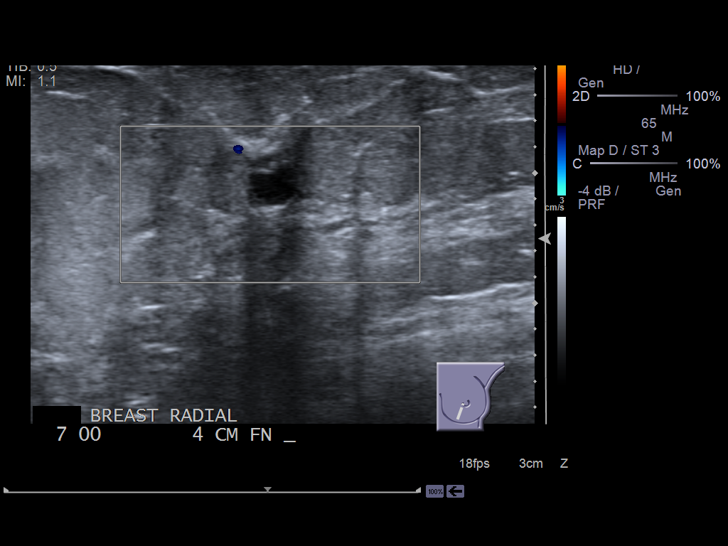
[im 6/21]
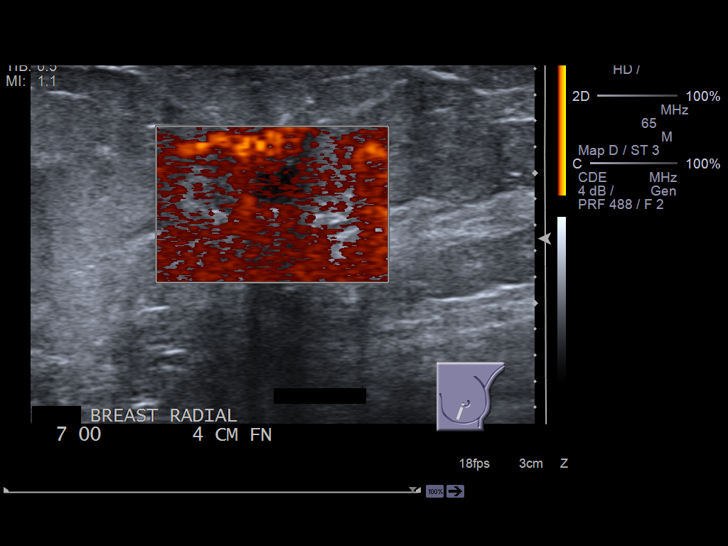
[im 7/21]
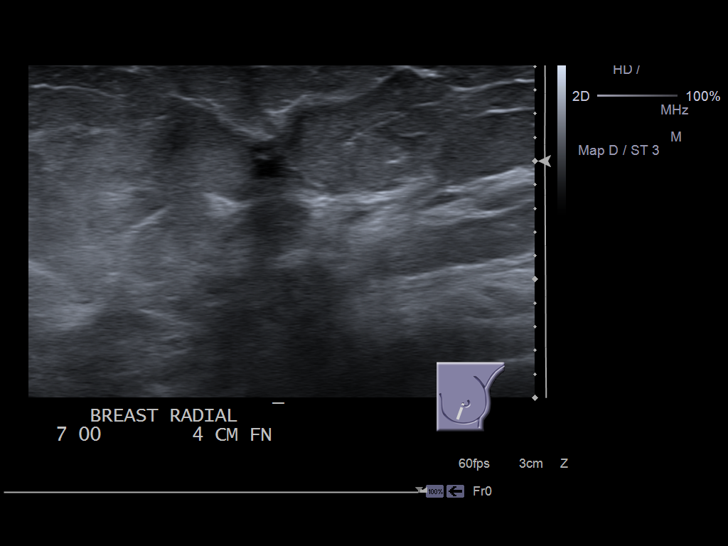
[im 9/21]
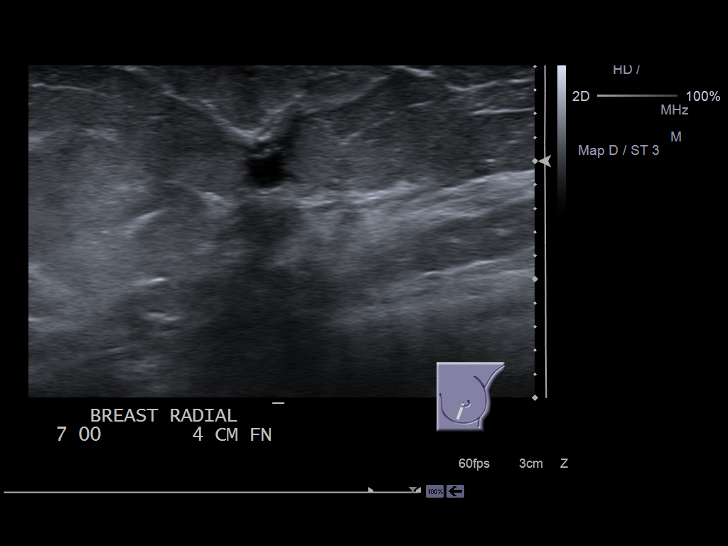
[im 10/21]
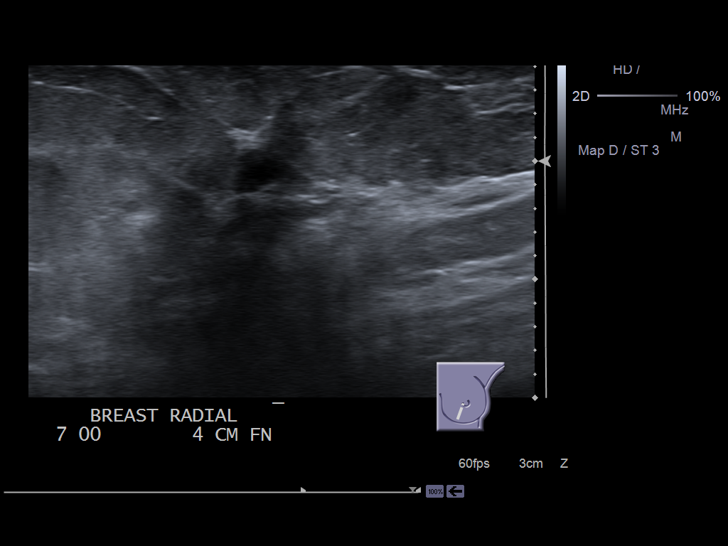
[im 12/21]
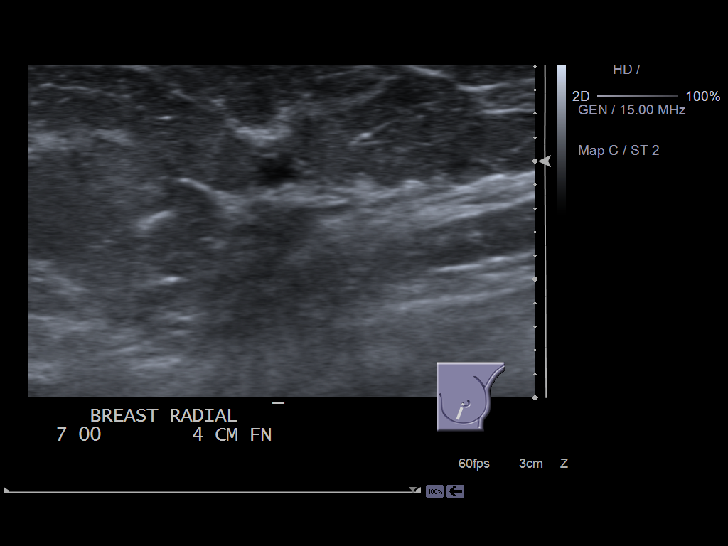
[im 13/21]
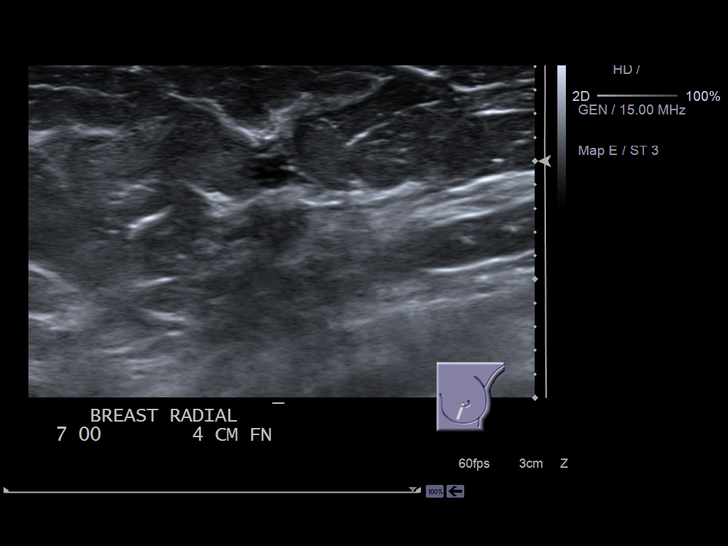
[im 15/21]
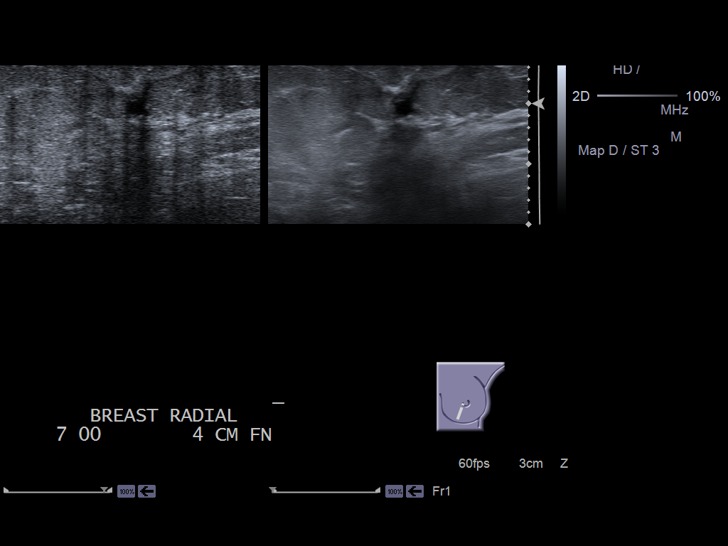
[im 16/21]
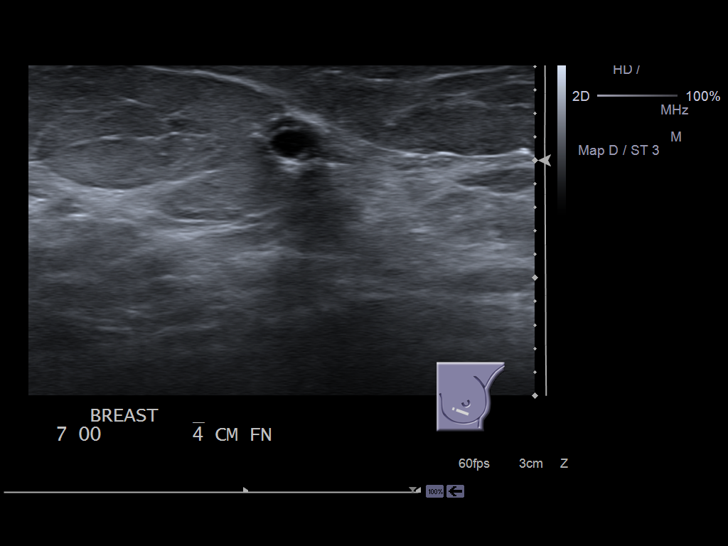
[im 18/21]
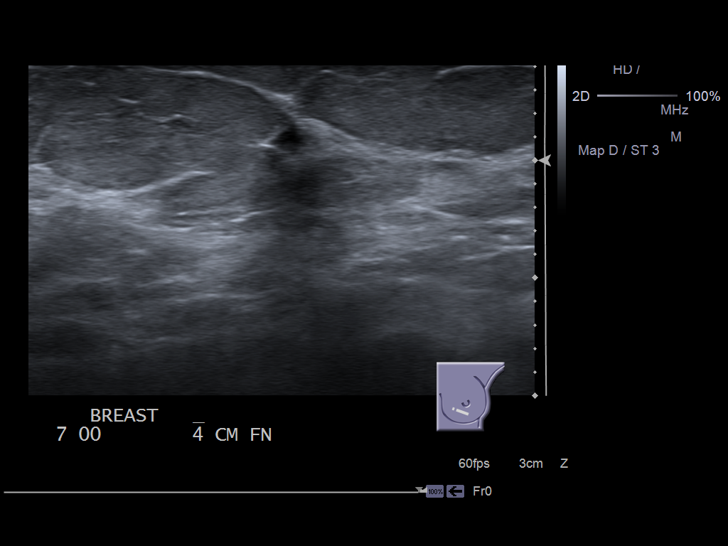
[im 19/21]
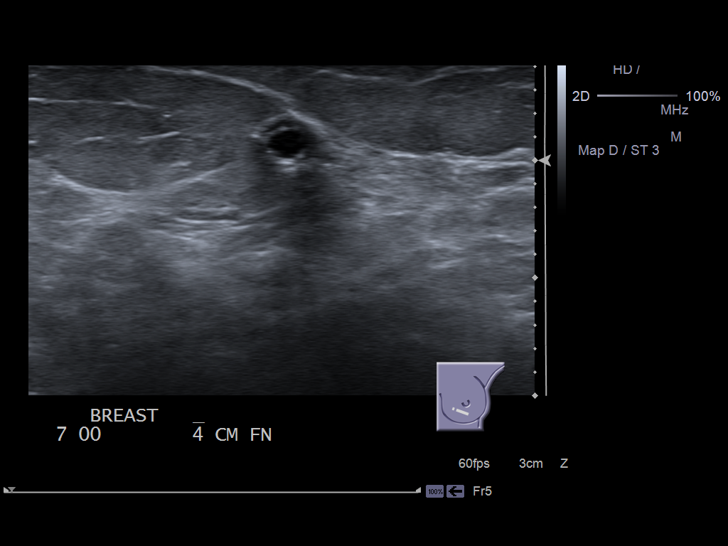
[im 21/21]
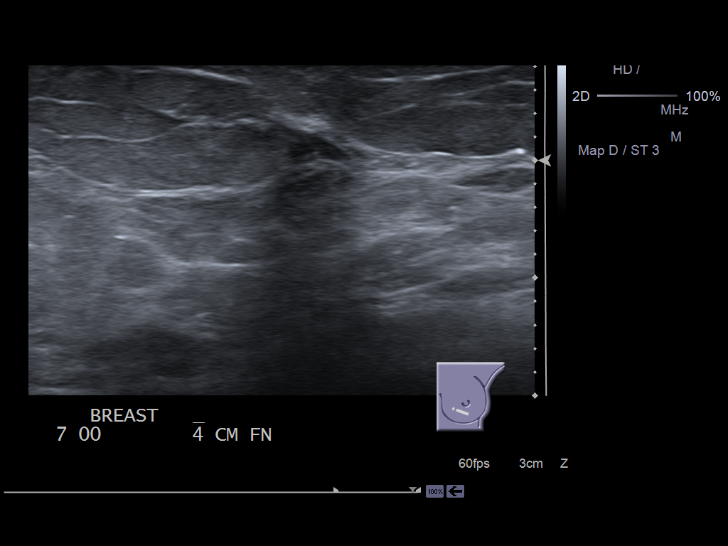

[14 of 21 positions shown; findings below may reference images not displayed]

FINDINGS: The previous described hypoechoic nodule at the [DATE] position is
once again appreciated. The walls of the nodule are irregularly bordered and
there is acoustic shadowing associated with the nodule. When compared to the
previous study the nodule is unchanged. Further evaluation with tissue
sampling is recommended.
IMPRESSION: Persistent concerning nodule at the [DATE] position as
described above BI-RADS category 4.

## 2012-07-27 ENCOUNTER — Ambulatory Visit: Payer: Self-pay | Admitting: Surgery

## 2012-08-03 ENCOUNTER — Ambulatory Visit: Payer: Self-pay | Admitting: Surgery

## 2012-08-03 IMAGING — MG US NEEDLE LOCALIZATION*L*
1 series · 2 of 2 positions shown · non-contrast
Comparison: none

REASON FOR EXAM: exc Left breast mass US NL mammo after   surg 10am
COMMENTS:

[L CC · left · 2 of 2 slices shown]
[im 1/2]
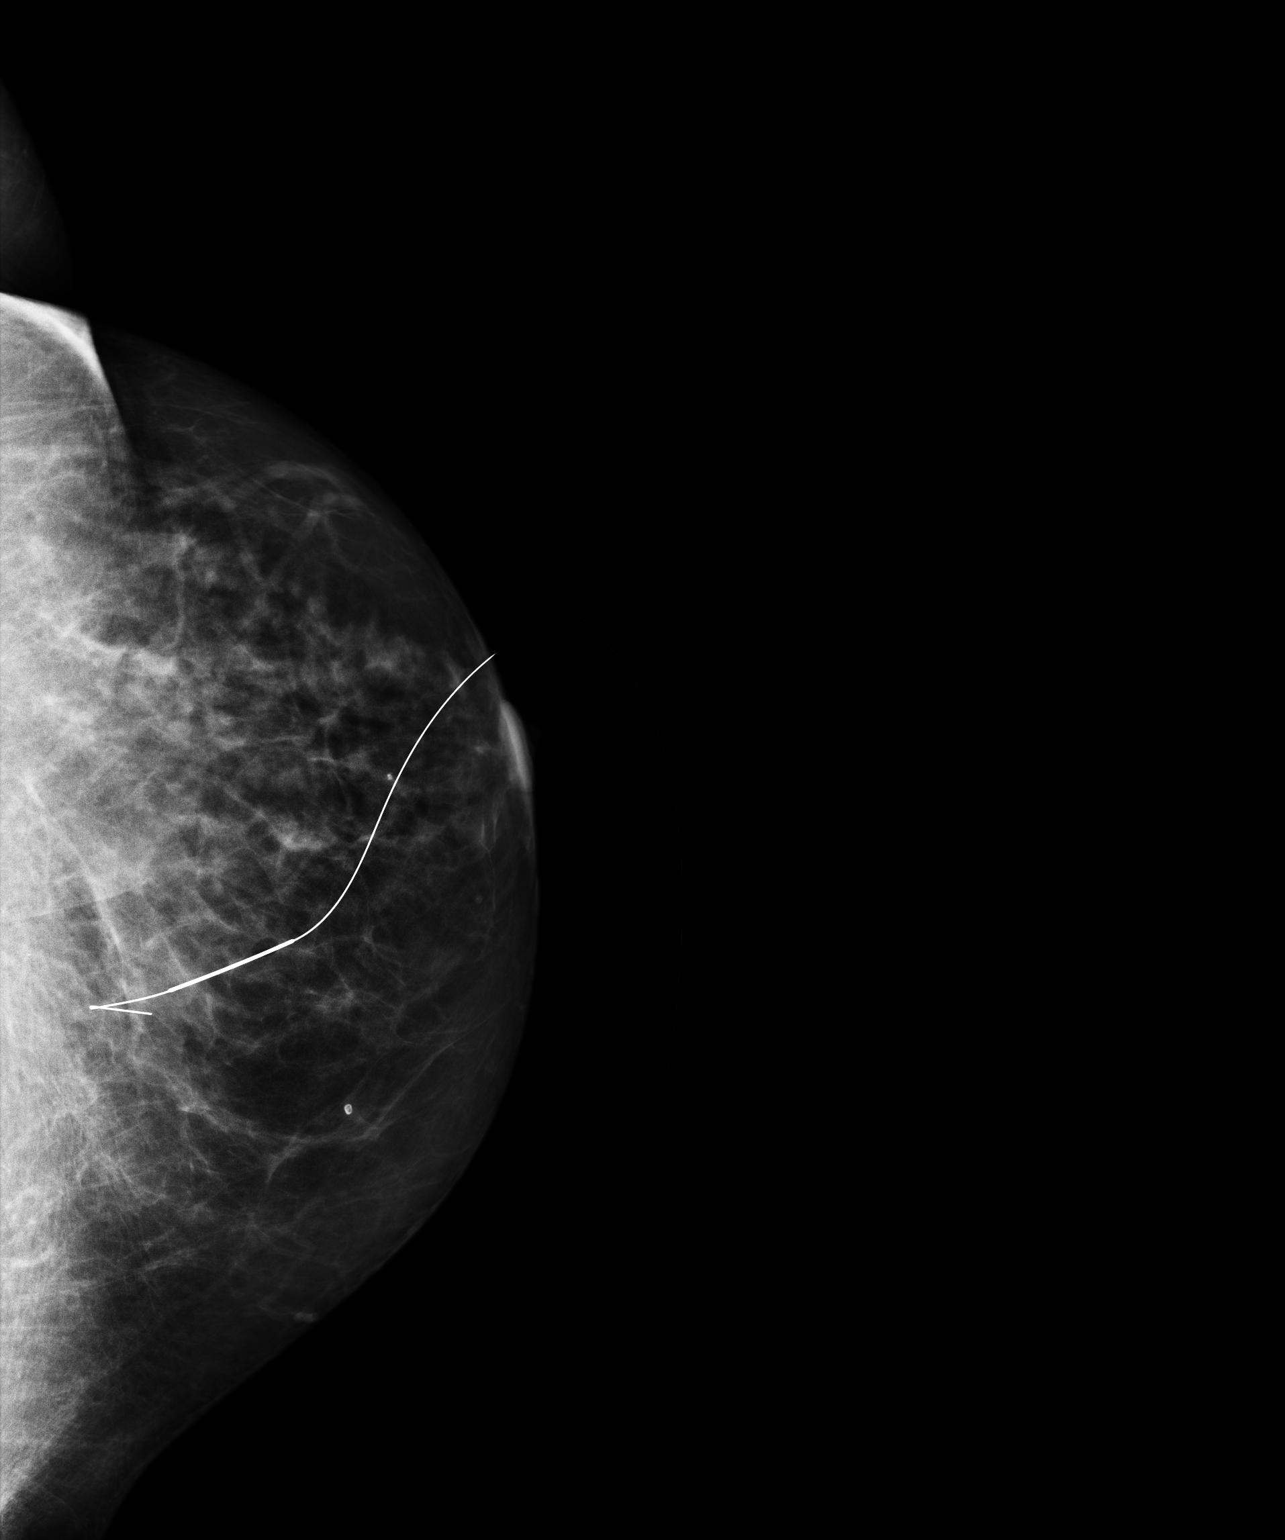
[im 2/2]
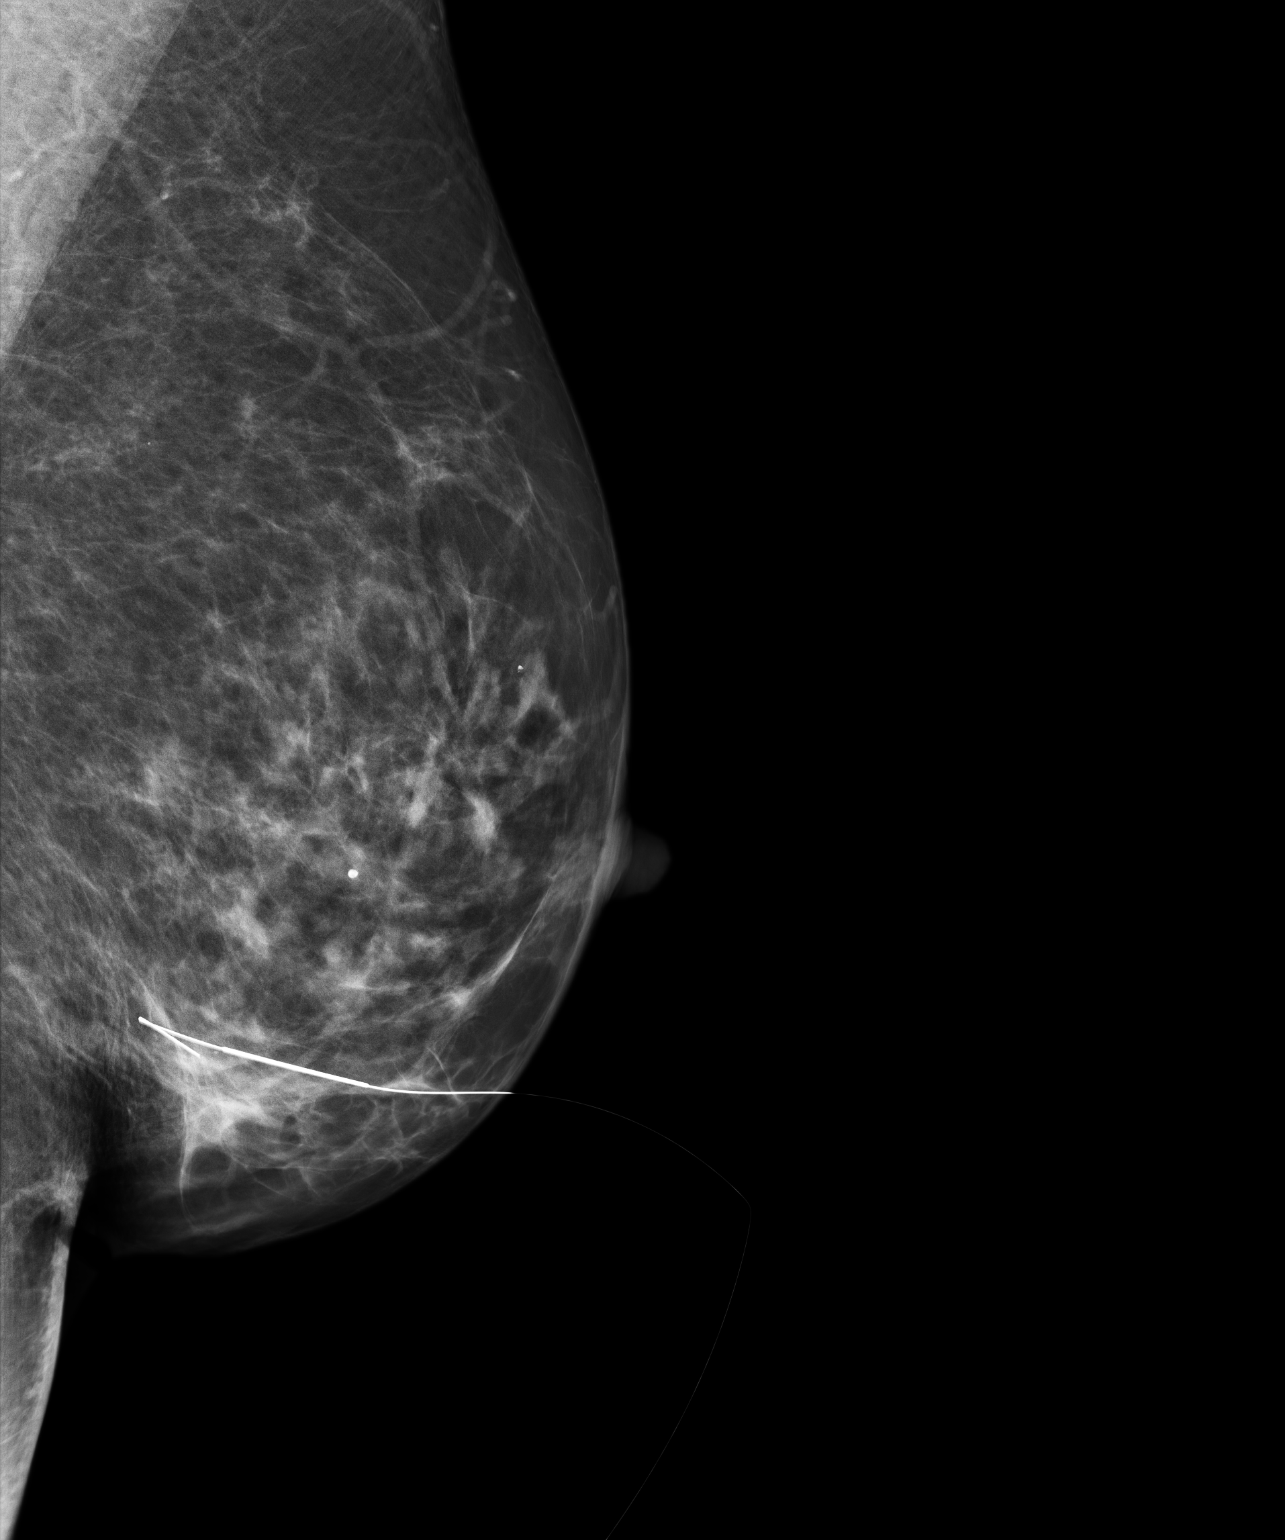

[2 of 2 positions shown; findings below may reference images not displayed]

PROCEDURE:     US  - US GUIDED NEEDLE LOCAL L BREAST  - [DATE]  [DATE]

RESULT:     Following sterile preparation of the patient and localization of
the lesion with ultrasound, local anesthesia administered 1% lidocaine. A
Kopans needle and hookwire was placed. The hookwire deployed adjacent to the
nodule. No complications.
IMPRESSION: Successful ultrasound directed needle localization.

## 2012-08-03 IMAGING — US US NEEDLE LOCALIZATION*L*
1 series · 12 of 12 positions shown · non-contrast
Comparison: none

REASON FOR EXAM: exc Left breast mass US NL mammo after   surg 10am
COMMENTS:

[Series 1: us needle localization*left* · 0.08mm/px · 12 of 12 slices shown]
[im 1/12]
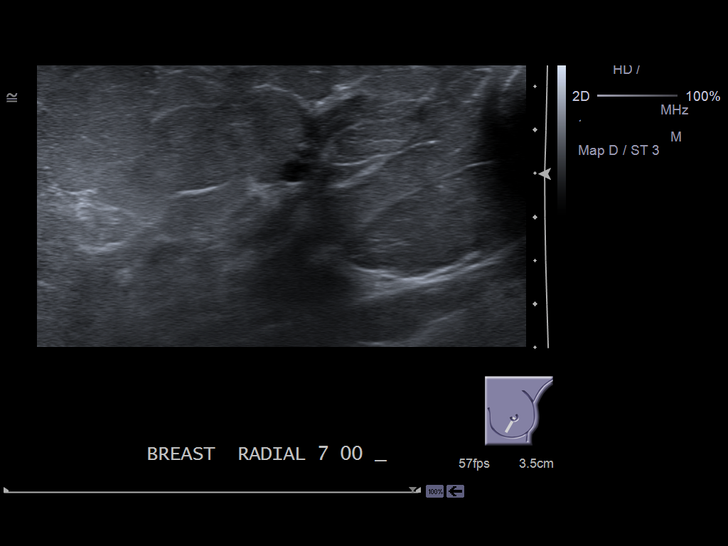
[im 2/12]
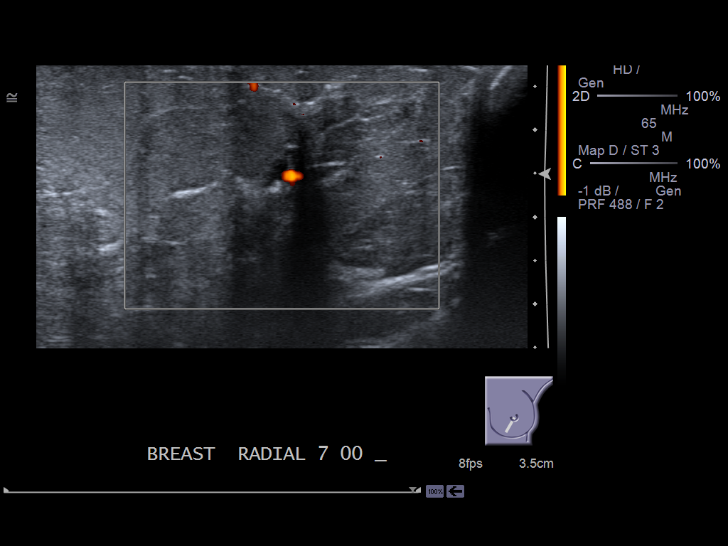
[im 3/12]
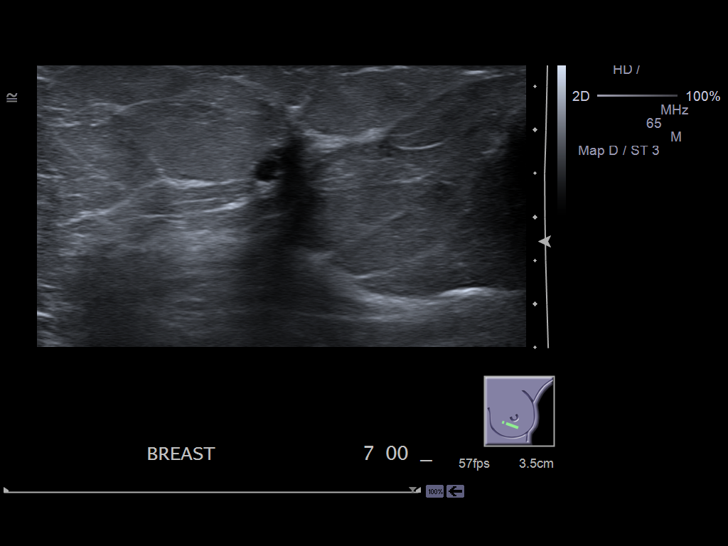
[im 4/12]
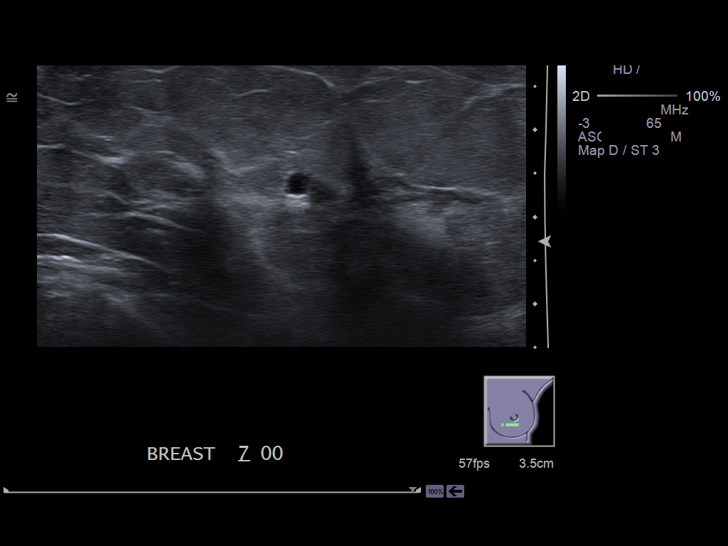
[im 5/12]
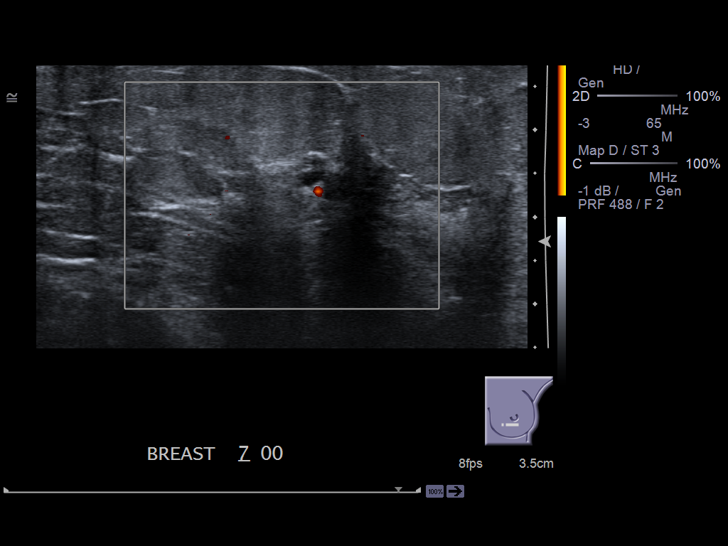
[im 6/12]
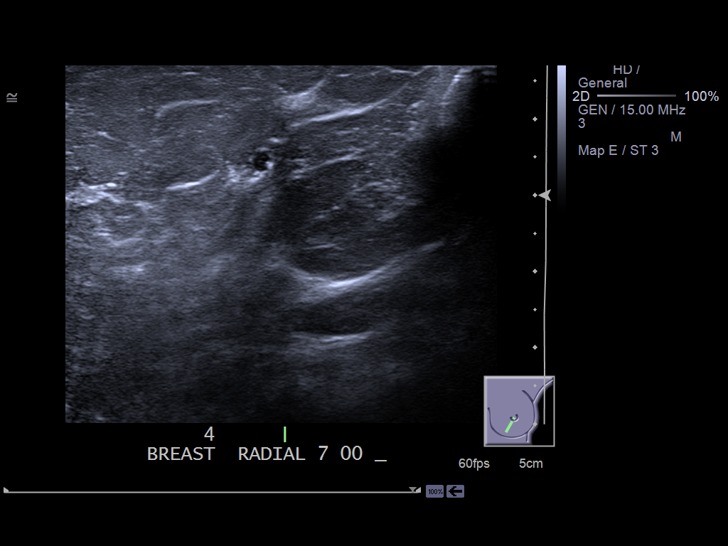
[im 7/12]
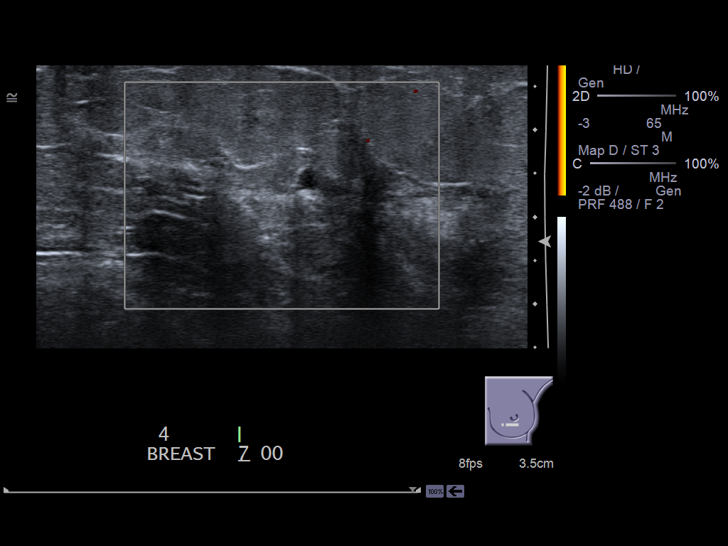
[im 8/12]
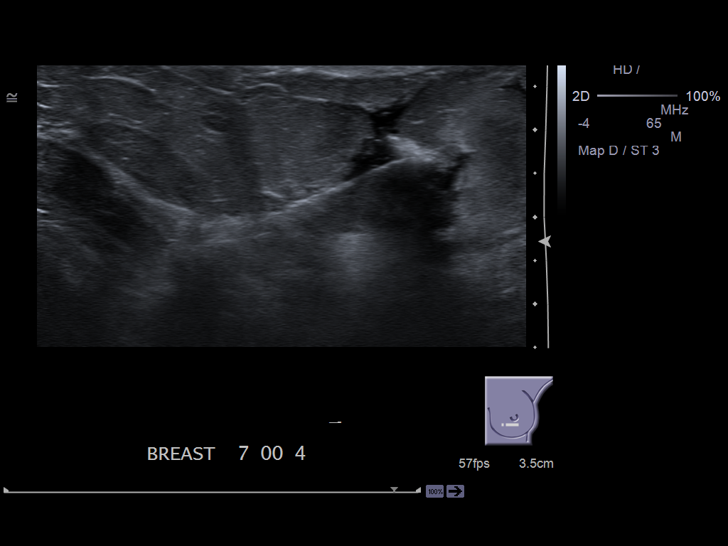
[im 9/12]
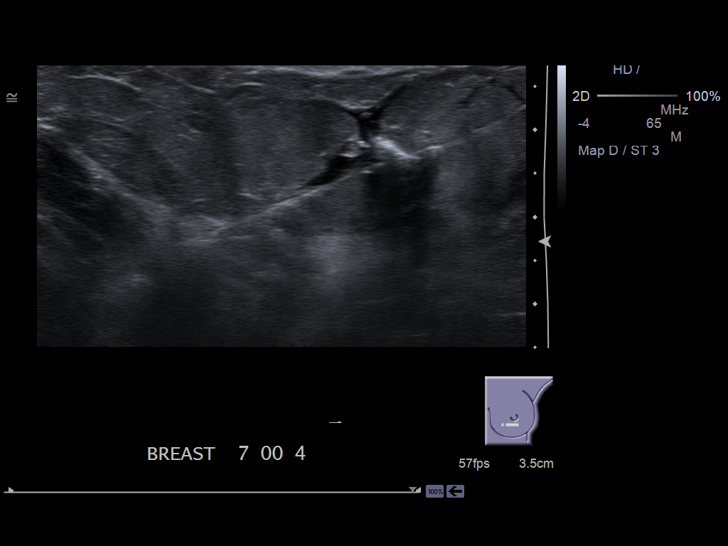
[im 10/12]
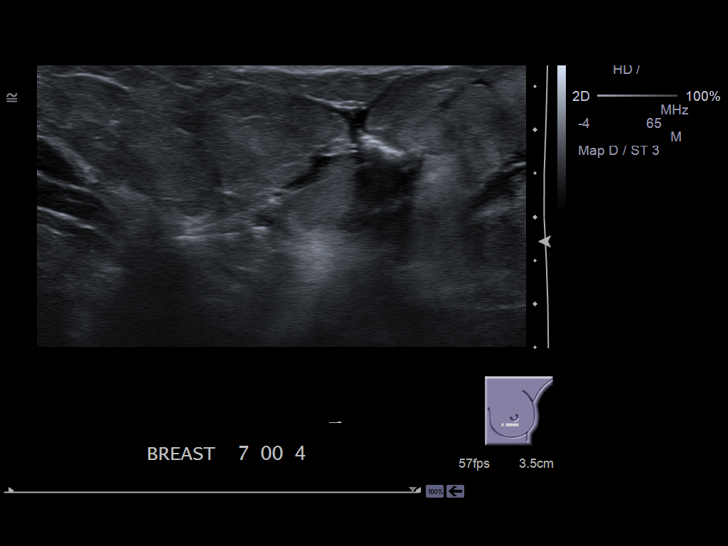
[im 11/12]
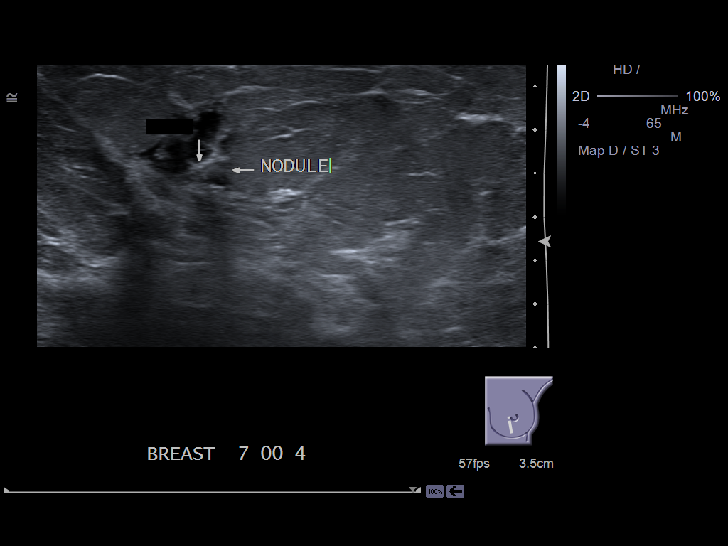
[im 12/12]
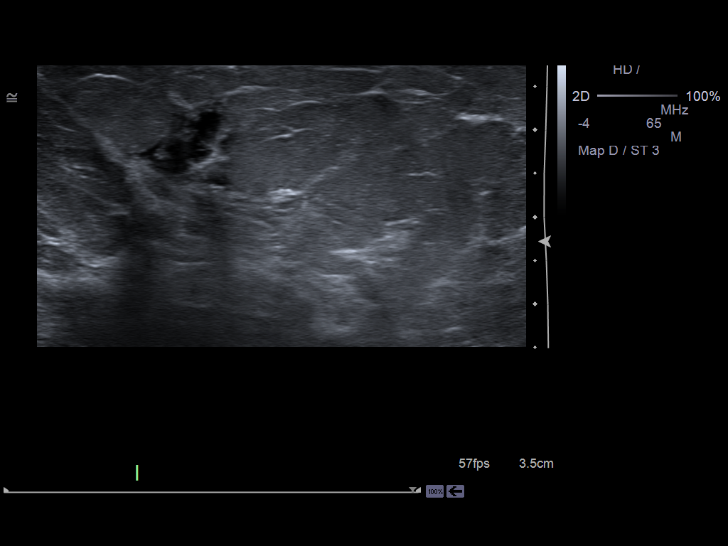

[12 of 12 positions shown; findings below may reference images not displayed]

PROCEDURE:     US  - US GUIDED NEEDLE LOCAL L BREAST  - [DATE]  [DATE]

RESULT:     Following sterile preparation of the patient and localization of
the lesion with ultrasound, local anesthesia administered 1% lidocaine. A
Kopans needle and hookwire was placed. The hookwire deployed adjacent to the
nodule. No complications.
IMPRESSION: Successful ultrasound directed needle localization.

## 2012-08-05 LAB — PATHOLOGY REPORT

## 2013-11-07 ENCOUNTER — Ambulatory Visit: Payer: Self-pay | Admitting: Family Medicine

## 2013-11-07 IMAGING — MG MM DIGITAL SCREENING BILAT W/ CAD
1 series · 5 of 5 positions shown · non-contrast
Comparison: Previous exam(s).

CLINICAL DATA: Screening.

EXAM:
DIGITAL SCREENING BILATERAL MAMMOGRAM WITH CAD

[R CC · right · 5 of 5 slices shown]
[im 1/5]
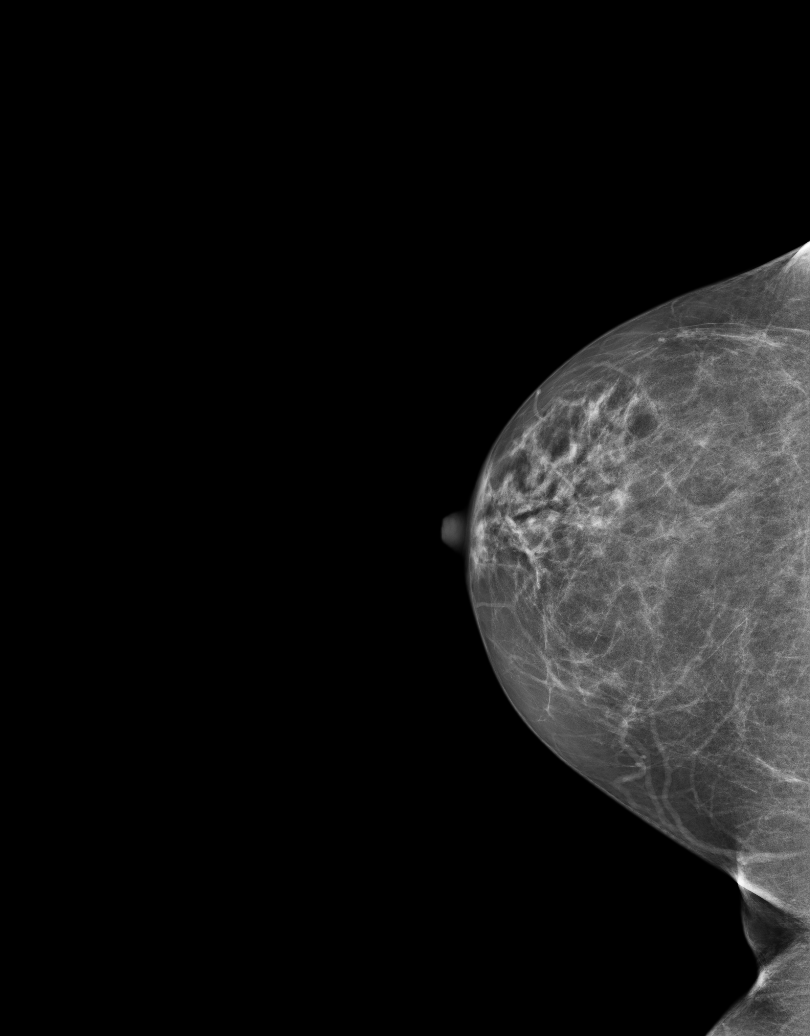
[im 2/5]
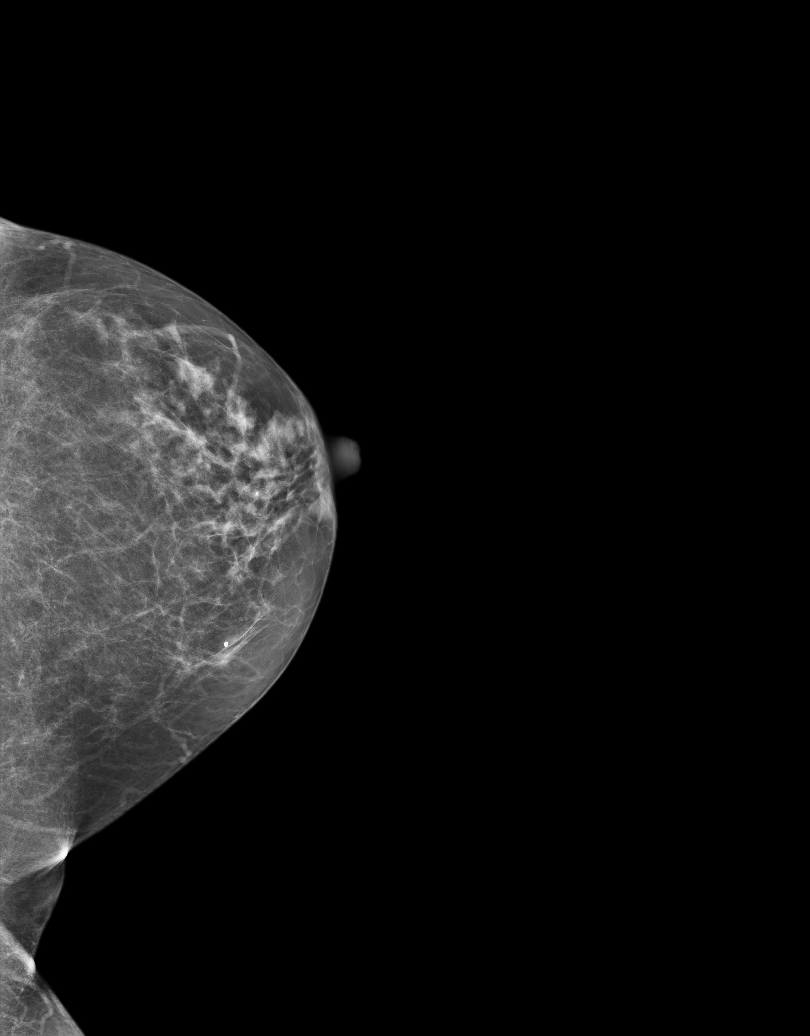
[im 3/5]
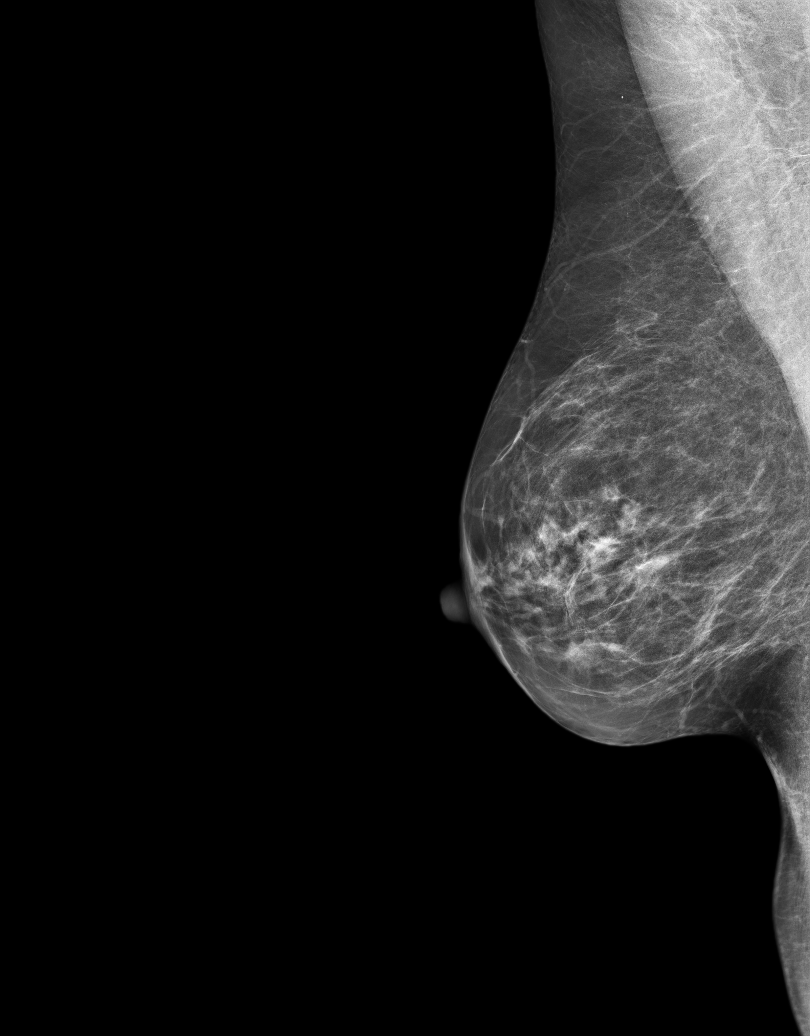
[im 4/5]
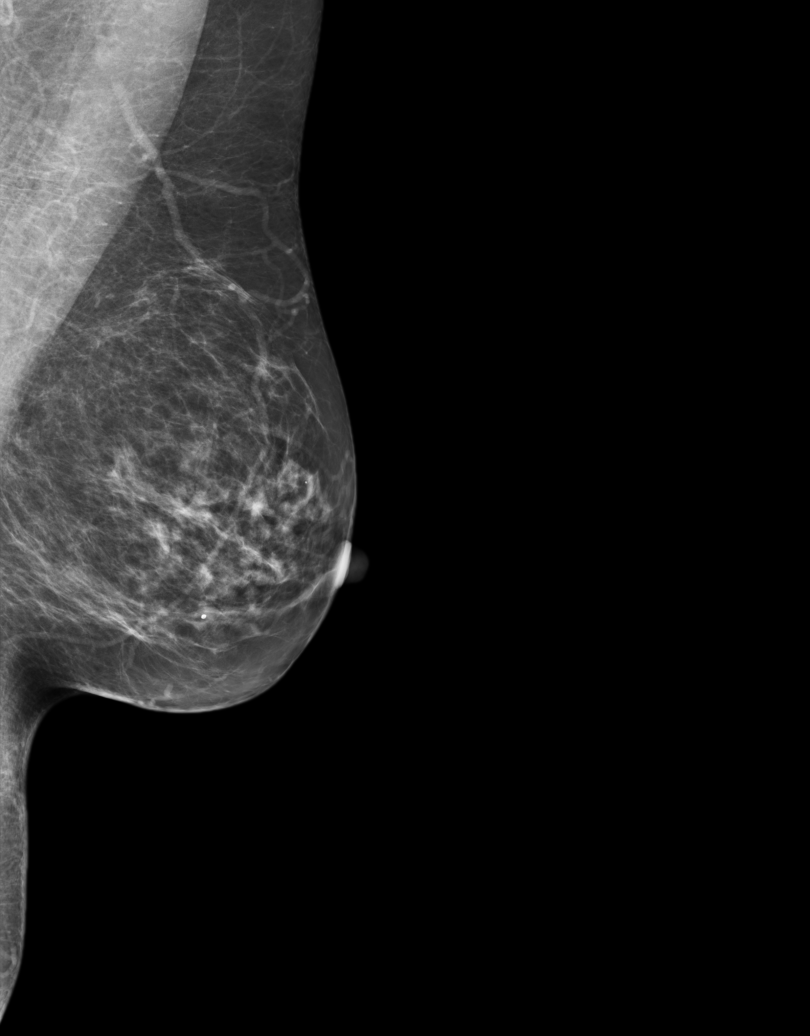
[im 5/5]
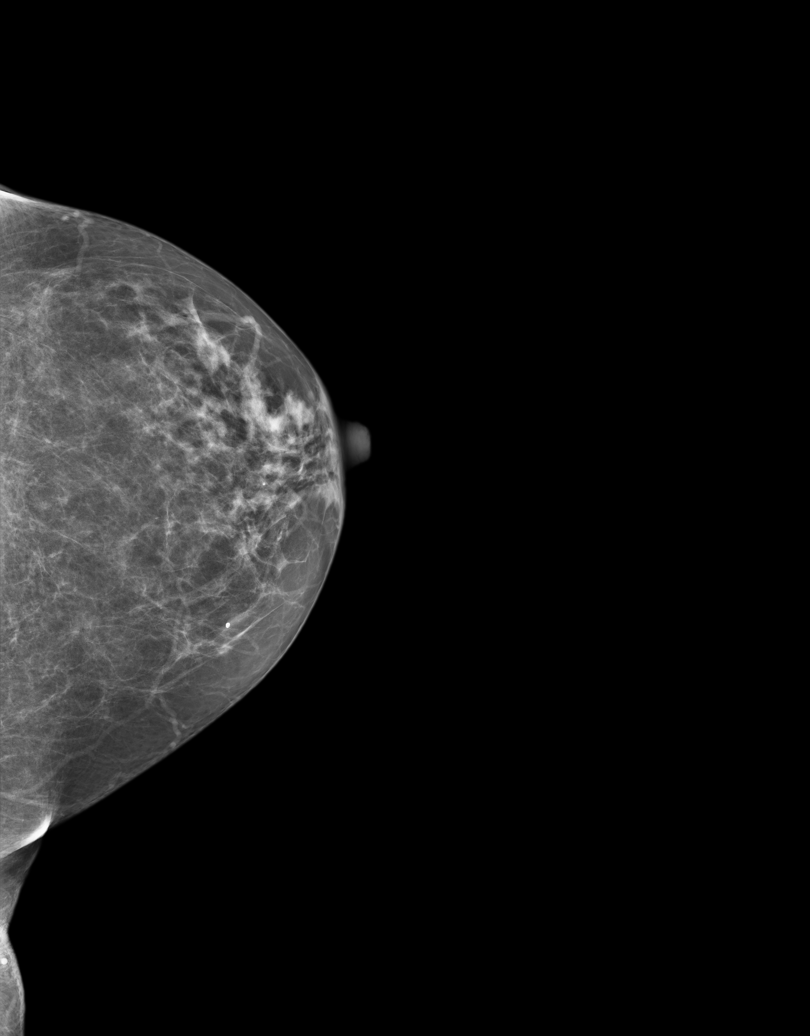

[5 of 5 positions shown; findings below may reference images not displayed]

ACR Breast Density Category b: There are scattered areas of
fibroglandular density.
FINDINGS: There are no findings suspicious for malignancy. Images were
processed with CAD.
IMPRESSION: No mammographic evidence of malignancy. A result letter of this
screening mammogram will be mailed directly to the patient.

RECOMMENDATION:
Screening mammogram in one year. (Code:[US])

BI-RADS CATEGORY  1: Negative.

## 2014-08-29 NOTE — Op Note (Signed)
PATIENT NAME:  Katherine [name], COBURN MR#:  [id number] DATE OF BIRTH:  [birth year]  DATE OF PROCEDURE:  03/30/2012  PREOPERATIVE DIAGNOSIS:  Senile cataract left eye.  POSTOPERATIVE DIAGNOSIS:  Senile cataract left eye.  PROCEDURE:  Phacoemulsification with posterior chamber intraocular lens implantation of the left eye.  LENS: ZCB00 18.0 diopter posterior chamber intraocular lens.  ULTRASOUND TIME:  15% of 1 minute, 16 seconds for CDE 11.2.  SURGEON:  Mali Ranveer Wahlstrom, MD  ANESTHESIA:  Topical with tetracaine drops and 2% Xylocaine jelly.  COMPLICATIONS:  None.  DESCRIPTION OF PROCEDURE:  The patient was identified in the holding room and transported to the operating room and placed in the supine position under the operating microscope.  The left eye was identified as the operative eye and it was prepped and draped in the usual sterile ophthalmic fashion.  A 1 millimeter clear-corneal paracentesis was made at the 12 o'clock  position.  The anterior chamber was filled with Viscoat viscoelastic.  A 2.4 millimeter keratome was used to make a near-clear corneal incision at the 9 o'clock position.  A curvilinear capsulorrhexis was made with a cystotome and capsulorrhexis forceps.  Balanced salt solution was used to hydrodissect and hydrodelineate the nucleus.  Phacoemulsification was then used in horizontal chopping fashion to remove the lens nucleus and epinucleus.  The remaining cortex was then removed using the irrigation and aspiration handpiece. Provisc was then placed into the capsular bag to distend it for lens placement.  A ZCB00 18.0 diopter lens was then injected into the capsular bag.  The remaining viscoelastic was aspirated.  Wounds were hydrated with balanced salt solution.  The anterior chamber was inflated to a physiologic pressure with balanced salt solution.  Miostat was placed into the anterior chamber to constrict the pupil.  No wound leaks were noted.  Topical Vigamox drops and  Maxitrol ointment were applied to the eye.  The patient was taken to the recovery room in stable condition without complications of anesthesia or surgery. ____________________________ Wyonia Hough, MD crb:slb D: 03/30/2012 12:13:34 ET T: 03/30/2012 12:17:11 ET JOB#: [id number]  cc: Wyonia Hough, MD, <Dictator> Leandrew Koyanagi MD ELECTRONICALLY SIGNED 03/31/2012 11:59

## 2014-09-01 NOTE — Op Note (Signed)
PATIENT NAME:  Katherine [name], Katherine [name] MR#:  [id number] DATE OF BIRTH:  [birth year]  DATE OF PROCEDURE:  08/03/2012  PREOPERATIVE DIAGNOSIS: Left breast mass.   POSTOPERATIVE DIAGNOSIS: Left breast mass.   PROCEDURE: Excision of left breast mass.   SURGEON: Loreli Dollar, MD   ANESTHESIA: General.   INDICATION: This 69 year old female recently had a mammogram depicting a 7 mm nodular density in the 7 o'clock position of the left breast, and excision was recommended for further evaluation and treatment.   The patient did have preoperative ultrasound-guided insertion of Kopans wire with followup mammogram. These images were reviewed prior to surgery.   DESCRIPTION OF PROCEDURE: The patient was placed on the operating table in the supine position under general anesthesia. The dressing was removed from the left breast, exposing the Kopans wire, which entered the breast at approximately 6:30 position. The wire was cut 2 cm from the skin. The breast was prepared with ChloraPrep and draped in a sterile manner.   A curvilinear incision was made from approximately 6 o'clock to 8 o'clock position about a centimeter outside the border of the areola and carried down through subcutaneous tissues to encounter the wire. Next, a portion surrounding the thick portion of the wire extending down to the tip of the wire was excised. This was approximately 1.5 x 1.5 x 3 cm in dimension. There was some mild firmness within the tissue, and was submitted for routine pathology. The wound was inspected. Several small bleeding points were cauterized. Hemostasis was subsequently intact. The tissues were infiltrated with 0.5% Sensorcaine with epinephrine and also subcuticular tissues infiltrated as well using a total of 9 mL. Next, the wound was closed with a running 5-0 Monocryl subcuticular suture and Dermabond. The patient tolerated surgery satisfactorily and was then prepared for transfer to the recovery room.     ____________________________ Lenna Sciara. Rochel Brome, MD jws:OSi D: 08/03/2012 11:01:47 ET T: 08/03/2012 11:11:48 ET JOB#: [id number]  cc: Loreli Dollar, MD, <Dictator> Loreli Dollar MD ELECTRONICALLY SIGNED 08/05/2012 20:49

## 2015-06-25 ENCOUNTER — Observation Stay
Admission: EM | Admit: 2015-06-25 | Discharge: 2015-06-27 | Disposition: A | Discharge status: Home / Self Care | Payer: Medicare Other | Attending: Internal Medicine | Admitting: Internal Medicine

## 2015-06-25 ENCOUNTER — Encounter: Payer: Self-pay | Admitting: Emergency Medicine

## 2015-06-25 ENCOUNTER — Emergency Department: Payer: Medicare Other

## 2015-06-25 DIAGNOSIS — K529 Noninfective gastroenteritis and colitis, unspecified: Secondary | ICD-10-CM | POA: Diagnosis present

## 2015-06-25 DIAGNOSIS — Z79899 Other long term (current) drug therapy: Secondary | ICD-10-CM | POA: Insufficient documentation

## 2015-06-25 DIAGNOSIS — Z794 Long term (current) use of insulin: Secondary | ICD-10-CM | POA: Insufficient documentation

## 2015-06-25 DIAGNOSIS — Z833 Family history of diabetes mellitus: Secondary | ICD-10-CM | POA: Insufficient documentation

## 2015-06-25 DIAGNOSIS — Z8249 Family history of ischemic heart disease and other diseases of the circulatory system: Secondary | ICD-10-CM | POA: Insufficient documentation

## 2015-06-25 DIAGNOSIS — Z23 Encounter for immunization: Secondary | ICD-10-CM | POA: Diagnosis not present

## 2015-06-25 DIAGNOSIS — Z9049 Acquired absence of other specified parts of digestive tract: Secondary | ICD-10-CM | POA: Insufficient documentation

## 2015-06-25 DIAGNOSIS — K922 Gastrointestinal hemorrhage, unspecified: Secondary | ICD-10-CM | POA: Diagnosis present

## 2015-06-25 DIAGNOSIS — I480 Paroxysmal atrial fibrillation: Secondary | ICD-10-CM | POA: Diagnosis not present

## 2015-06-25 DIAGNOSIS — E119 Type 2 diabetes mellitus without complications: Secondary | ICD-10-CM | POA: Insufficient documentation

## 2015-06-25 DIAGNOSIS — I10 Essential (primary) hypertension: Secondary | ICD-10-CM | POA: Diagnosis not present

## 2015-06-25 DIAGNOSIS — K648 Other hemorrhoids: Secondary | ICD-10-CM | POA: Diagnosis not present

## 2015-06-25 DIAGNOSIS — Z87891 Personal history of nicotine dependence: Secondary | ICD-10-CM | POA: Diagnosis not present

## 2015-06-25 DIAGNOSIS — Z7901 Long term (current) use of anticoagulants: Secondary | ICD-10-CM | POA: Diagnosis not present

## 2015-06-25 DIAGNOSIS — R55 Syncope and collapse: Secondary | ICD-10-CM | POA: Diagnosis not present

## 2015-06-25 DIAGNOSIS — E785 Hyperlipidemia, unspecified: Secondary | ICD-10-CM | POA: Insufficient documentation

## 2015-06-25 DIAGNOSIS — E039 Hypothyroidism, unspecified: Secondary | ICD-10-CM | POA: Diagnosis not present

## 2015-06-25 DIAGNOSIS — R42 Dizziness and giddiness: Secondary | ICD-10-CM | POA: Diagnosis not present

## 2015-06-25 HISTORY — DX: Essential (primary) hypertension: I10

## 2015-06-25 LAB — CBC
HCT: 39.9 % (ref 35.0–47.0)
Hemoglobin: 13.4 g/dL (ref 12.0–16.0)
MCH: 29.2 pg (ref 26.0–34.0)
MCHC: 33.5 g/dL (ref 32.0–36.0)
MCV: 87.2 fL (ref 80.0–100.0)
PLATELETS: 216 10*3/uL (ref 150–440)
RBC: 4.58 MIL/uL (ref 3.80–5.20)
RDW: 17.9 % — AB (ref 11.5–14.5)
WBC: 9.7 10*3/uL (ref 3.6–11.0)

## 2015-06-25 LAB — GLUCOSE, CAPILLARY: Glucose-Capillary: 109 mg/dL — ABNORMAL HIGH (ref 65–99)

## 2015-06-25 LAB — COMPREHENSIVE METABOLIC PANEL
ALT: 22 U/L (ref 14–54)
AST: 30 U/L (ref 15–41)
Albumin: 4 g/dL (ref 3.5–5.0)
Alkaline Phosphatase: 66 U/L (ref 38–126)
Anion gap: 9 (ref 5–15)
BUN: 21 mg/dL — AB (ref 6–20)
CHLORIDE: 97 mmol/L — AB (ref 101–111)
CO2: 25 mmol/L (ref 22–32)
Calcium: 9.2 mg/dL (ref 8.9–10.3)
Creatinine, Ser: 1.2 mg/dL — ABNORMAL HIGH (ref 0.44–1.00)
GFR calc Af Amer: 53 mL/min — ABNORMAL LOW (ref >60)
GFR calc non Af Amer: 46 mL/min — ABNORMAL LOW (ref >60)
GLUCOSE: 281 mg/dL — AB (ref 65–99)
Potassium: 3.8 mmol/L (ref 3.5–5.1)
Sodium: 131 mmol/L — ABNORMAL LOW (ref 135–145)
Total Bilirubin: 0.4 mg/dL (ref 0.3–1.2)
Total Protein: 7.5 g/dL (ref 6.5–8.1)

## 2015-06-25 LAB — TYPE AND SCREEN
ABO/RH(D): A POS
Antibody Screen: NEGATIVE

## 2015-06-25 LAB — PROTIME-INR
INR: 1.33
PROTHROMBIN TIME: 16.6 s — AB (ref 11.4–15.0)

## 2015-06-25 LAB — ABO/RH: ABO/RH(D): A POS

## 2015-06-25 LAB — APTT: aPTT: 36 seconds (ref 24–36)

## 2015-06-25 LAB — HEMOGLOBIN AND HEMATOCRIT, BLOOD
HEMATOCRIT: 37.2 % (ref 35.0–47.0)
HEMOGLOBIN: 12.5 g/dL (ref 12.0–16.0)

## 2015-06-25 IMAGING — CT CT CTA ABD/PEL W/CM AND/OR W/O CM
2 of 3 series · 13 of 32 positions shown, 18 images · IV contrast (APPLIED)
Comparison: None.

CLINICAL DATA: 69-year-old female with left lower quadrant
abdominal pain and GI bleed. Concern for ischemic colitis for
diverticulitis.

EXAM:
CTA ABDOMEN AND PELVIS wITHOUT AND WITH CONTRAST
TECHNIQUE: Multidetector CT imaging of the abdomen and pelvis was performed
using the standard protocol during bolus administration of
intravenous contrast. Multiplanar reconstructed images and MIPs were
obtained and reviewed to evaluate the vascular anatomy.
CONTRAST:  100mL OMNIPAQUE IOHEXOL 350 MG/ML SOLN

[Series 4: axial arterial · axial · arterial · 0.89mm/px · z∈[-1068,-722]mm · 7 of 248 slices shown]
[im 25/248  soft-tissue]
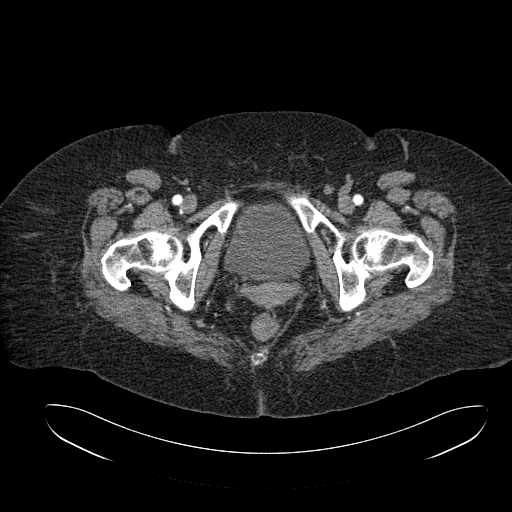
[im 50/248  soft-tissue]
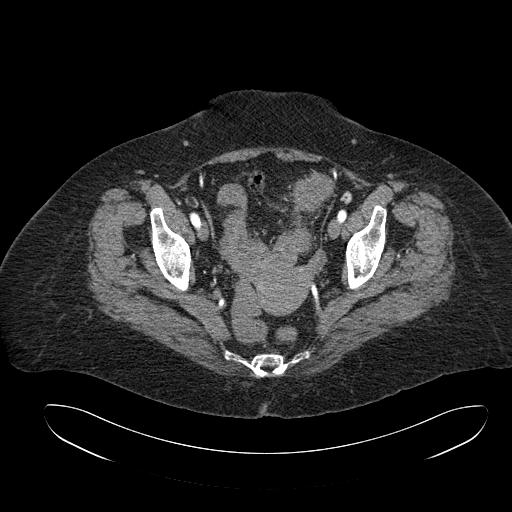
[im 75/248  soft-tissue]
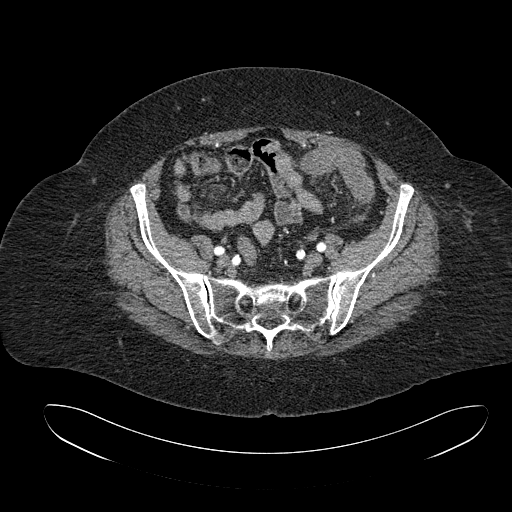
[im 112/248  soft-tissue]
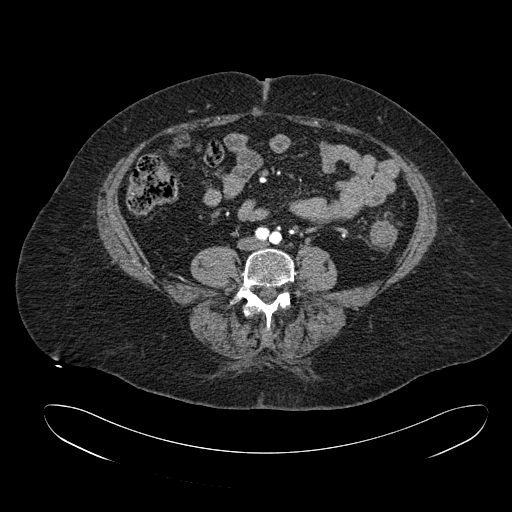
[im 136/248  soft-tissue]
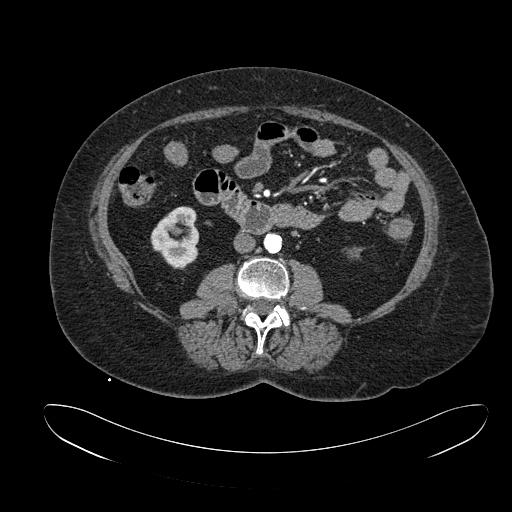
[im 173/248  soft-tissue]
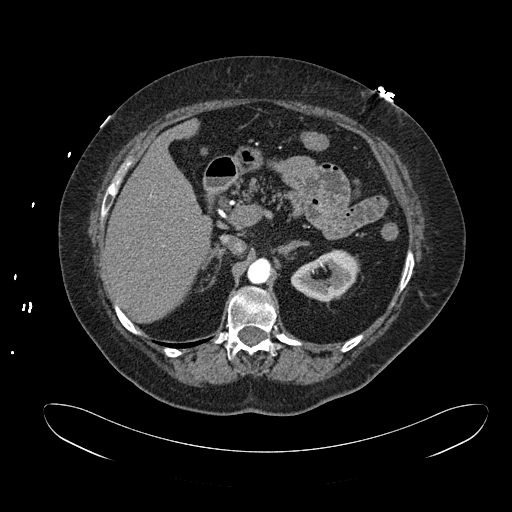
[im 198/248  soft-tissue]
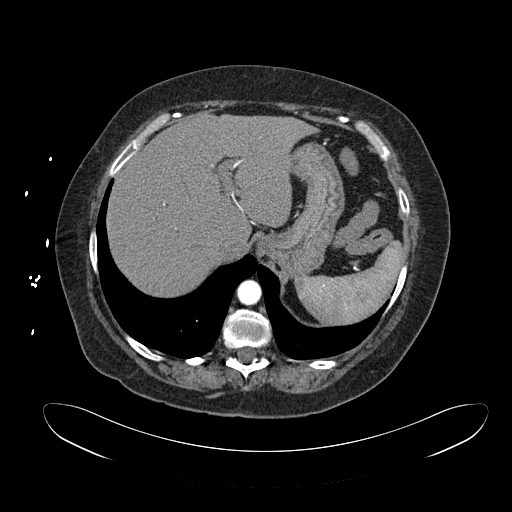

[Series 5: axial venous · axial · portal-venous · 0.89mm/px · z∈[-1046,-696]mm · 6 of 100 slices shown, 11 images]
[im 15/100  soft-tissue]
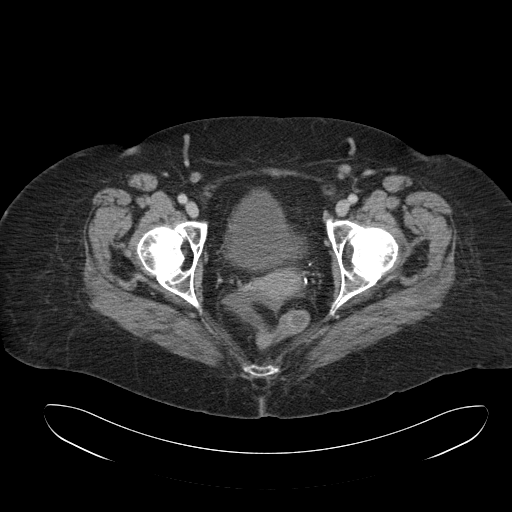
[im 15/100  bone]
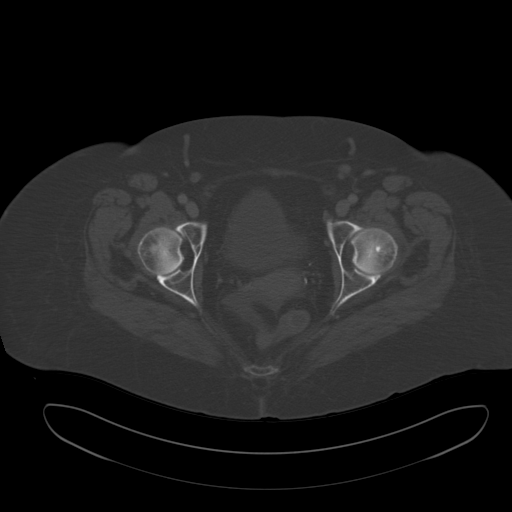
[im 29/100  soft-tissue]
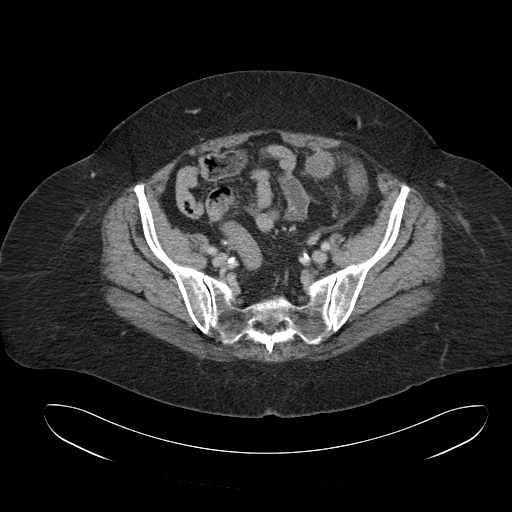
[im 43/100  soft-tissue]
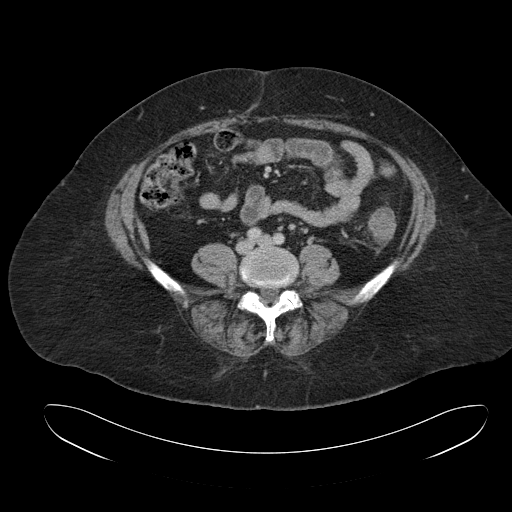
[im 43/100  lung]
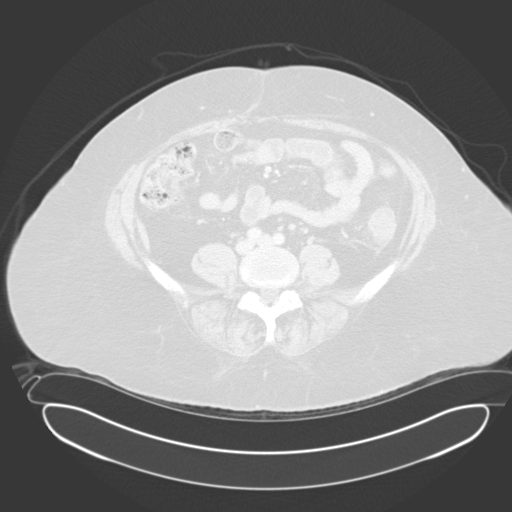
[im 57/100  soft-tissue]
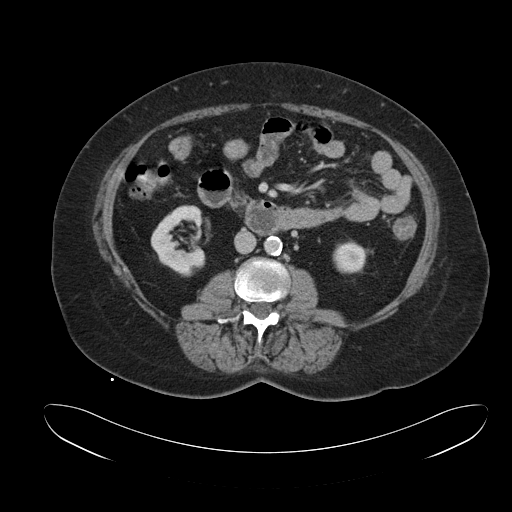
[im 57/100  lung]
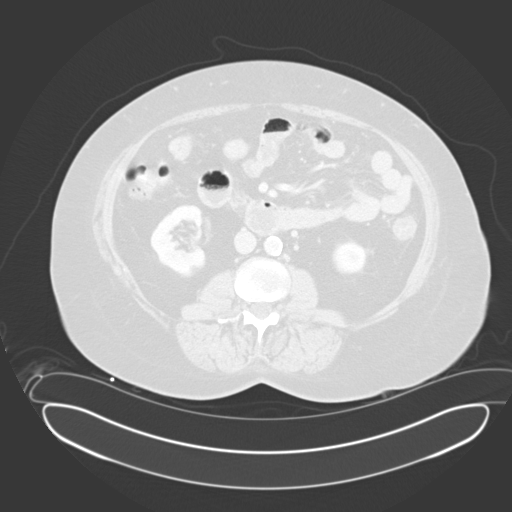
[im 71/100  soft-tissue]
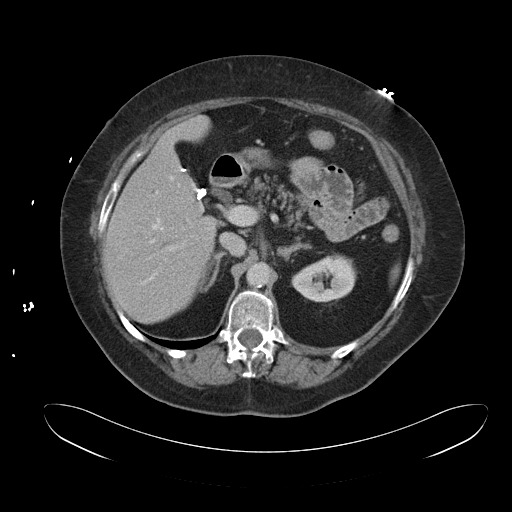
[im 71/100  lung]
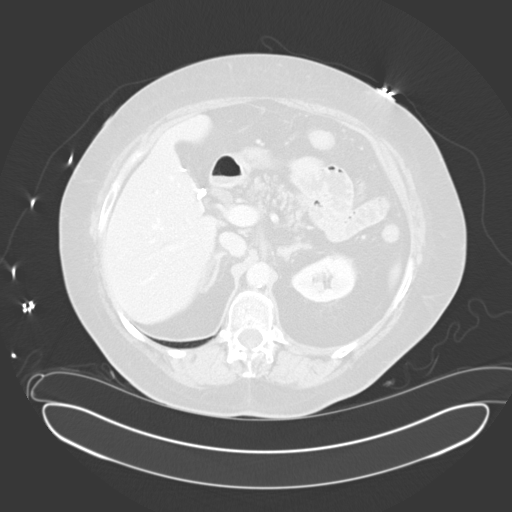
[im 85/100  soft-tissue]
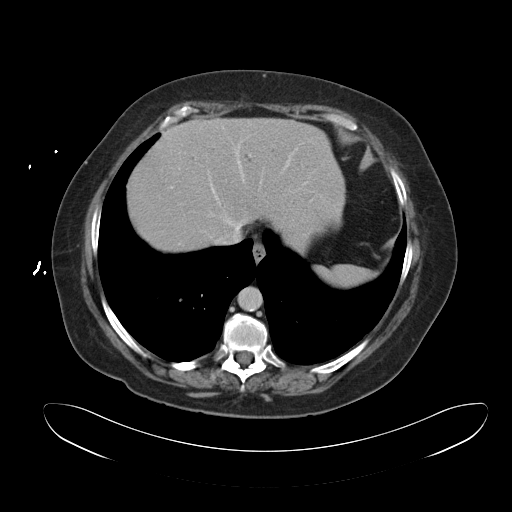
[im 85/100  lung]
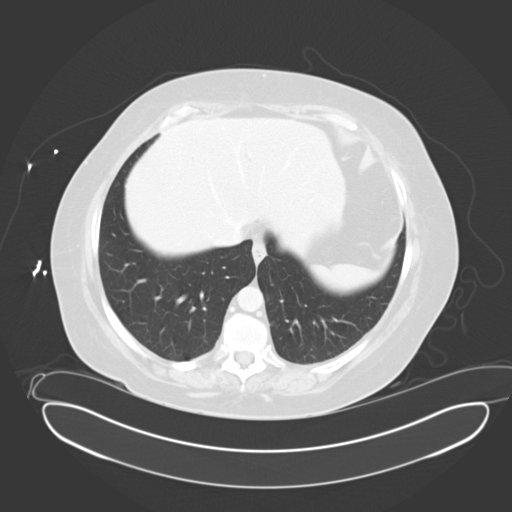

[13 of 32 positions shown; findings below may reference images not displayed]

FINDINGS: The visualized lung bases are clear. No intra-abdominal free air.
Small free fluid within the pelvis.

Cholecystectomy. Mild biliary ductal dilatation, likely post
cholecystectomy. The liver, pancreas, spleen, adrenal glands,
kidneys, visualized ureters, and urinary bladder appear
unremarkable. There is apparent haziness of the wall of the bladder
likely reactive to inflammatory changes of the colon. The uterus
appears grossly unremarkable. Surgical clips noted in the region of
the left adnexa.

There is circumferential thickening of the distal descending colon
as well as thickening and inflammatory changes of the sigmoid
compatible with colitis. No pneumatosis identified. There is no
evidence of bowel obstruction. Normal appendix.

There is mild aortoiliac atherosclerotic disease. The origins of the
celiac axis, SMA, IMA as well as the origins of the renal arteries
are patent. There is an accessory left hepatic artery arising from
the left gastric artery. There is a replaced right hepatic artery
arising from the SMA. No portal venous gas identified. The SMV and
main portal veins appear patent. There is no adenopathy.

There is a midline vertical anterior abdominal wall incisional scar.
Mild degenerative changes of the spine. No acute fracture.

Review of the MIP images confirms the above findings.
IMPRESSION: Colitis of the descending and sigmoid colon, likely inflammatory or
infectious in etiology. There is no pneumatosis or portal venous
gas. An ischemic colitis is less likely. Correlation with clinical
exam and stool cultures recommended.

## 2015-06-25 MED ORDER — METRONIDAZOLE 500 MG PO TABS
500.0000 mg | ORAL_TABLET | Freq: Three times a day (TID) | ORAL | Status: DC
  Administered 2015-06-26 (×2): 500 mg via ORAL
  Filled 2015-06-25 (×2): qty 1

## 2015-06-25 MED ORDER — LIOTHYRONINE SODIUM 25 MCG PO TABS
50.0000 ug | ORAL_TABLET | Freq: Every day | ORAL | Status: DC
  Administered 2015-06-26 (×2): 50 ug via ORAL
  Filled 2015-06-25 (×3): qty 2

## 2015-06-25 MED ORDER — ONDANSETRON HCL 4 MG PO TABS: 4.0000 mg | ORAL_TABLET | Freq: Four times a day (QID) | ORAL | Status: DC | PRN

## 2015-06-25 MED ORDER — AMLODIPINE BESYLATE 10 MG PO TABS
10.0000 mg | ORAL_TABLET | Freq: Every day | ORAL | Status: DC
  Administered 2015-06-26 (×2): 10 mg via ORAL
  Filled 2015-06-25 (×2): qty 1

## 2015-06-25 MED ORDER — VITAMIN D 1000 UNITS PO TABS
1000.0000 [IU] | ORAL_TABLET | Freq: Every day | ORAL | Status: DC
  Administered 2015-06-26 – 2015-06-27 (×2): 1000 [IU] via ORAL
  Filled 2015-06-25 (×2): qty 1

## 2015-06-25 MED ORDER — SODIUM CHLORIDE 0.9 % IV SOLN
INTRAVENOUS | Status: AC
  Administered 2015-06-26 (×2): via INTRAVENOUS

## 2015-06-25 MED ORDER — CIPROFLOXACIN HCL 500 MG PO TABS
500.0000 mg | ORAL_TABLET | Freq: Once | ORAL | Status: AC
  Administered 2015-06-25: 500 mg via ORAL
  Filled 2015-06-25: qty 1

## 2015-06-25 MED ORDER — PANTOPRAZOLE SODIUM 40 MG PO TBEC
40.0000 mg | DELAYED_RELEASE_TABLET | Freq: Every day | ORAL | Status: DC
  Administered 2015-06-26 – 2015-06-27 (×2): 40 mg via ORAL
  Filled 2015-06-25 (×2): qty 1

## 2015-06-25 MED ORDER — LEVOTHYROXINE SODIUM 150 MCG PO TABS
150.0000 ug | ORAL_TABLET | Freq: Every day | ORAL | Status: DC
  Administered 2015-06-26 (×2): 150 ug via ORAL
  Filled 2015-06-25 (×2): qty 1

## 2015-06-25 MED ORDER — ONDANSETRON HCL 4 MG/2ML IJ SOLN
4.0000 mg | Freq: Once | INTRAMUSCULAR | Status: AC
  Administered 2015-06-25: 4 mg via INTRAVENOUS
  Filled 2015-06-25: qty 2

## 2015-06-25 MED ORDER — INSULIN ASPART 100 UNIT/ML ~~LOC~~ SOLN
0.0000 [IU] | Freq: Three times a day (TID) | SUBCUTANEOUS | Status: DC
  Administered 2015-06-26: 3 [IU] via SUBCUTANEOUS
  Administered 2015-06-27: 2 [IU] via SUBCUTANEOUS
  Administered 2015-06-27: 3 [IU] via SUBCUTANEOUS
  Filled 2015-06-25: qty 3
  Filled 2015-06-25: qty 2
  Filled 2015-06-25: qty 3

## 2015-06-25 MED ORDER — OXYCODONE HCL 5 MG PO TABS: 5.0000 mg | ORAL_TABLET | ORAL | Status: DC | PRN

## 2015-06-25 MED ORDER — IOHEXOL 350 MG/ML SOLN
100.0000 mL | Freq: Once | INTRAVENOUS | Status: AC | PRN
  Administered 2015-06-25: 100 mL via INTRAVENOUS

## 2015-06-25 MED ORDER — MORPHINE SULFATE (PF) 2 MG/ML IV SOLN
2.0000 mg | INTRAVENOUS | Status: DC | PRN
Start: 2015-06-25 — End: 2015-06-27

## 2015-06-25 MED ORDER — ACETAMINOPHEN 650 MG RE SUPP: 650.0000 mg | Freq: Four times a day (QID) | RECTAL | Status: DC | PRN

## 2015-06-25 MED ORDER — METRONIDAZOLE 500 MG PO TABS
500.0000 mg | ORAL_TABLET | ORAL | Status: AC
  Administered 2015-06-25: 500 mg via ORAL
  Filled 2015-06-25: qty 1

## 2015-06-25 MED ORDER — ACETAMINOPHEN 325 MG PO TABS
650.0000 mg | ORAL_TABLET | Freq: Four times a day (QID) | ORAL | Status: DC | PRN
  Administered 2015-06-26 – 2015-06-27 (×3): 650 mg via ORAL
  Filled 2015-06-25 (×3): qty 2

## 2015-06-25 MED ORDER — SODIUM CHLORIDE 0.9 % IV BOLUS (SEPSIS)
1000.0000 mL | Freq: Once | INTRAVENOUS | Status: AC
  Administered 2015-06-25: 1000 mL via INTRAVENOUS

## 2015-06-25 MED ORDER — ONDANSETRON HCL 4 MG/2ML IJ SOLN: 4.0000 mg | Freq: Four times a day (QID) | INTRAMUSCULAR | Status: DC | PRN

## 2015-06-25 MED ORDER — HYDROMORPHONE HCL 1 MG/ML IJ SOLN
0.5000 mg | Freq: Once | INTRAMUSCULAR | Status: AC
  Administered 2015-06-25: 0.5 mg via INTRAVENOUS
  Filled 2015-06-25: qty 1

## 2015-06-25 MED ORDER — INSULIN ASPART 100 UNIT/ML ~~LOC~~ SOLN: 0.0000 [IU] | Freq: Every day | SUBCUTANEOUS | Status: DC

## 2015-06-25 NOTE — ED Notes (Signed)
Positive hemoccult.  

## 2015-06-25 NOTE — ED Notes (Signed)
Pt states unable to provide stool sample at this time.

## 2015-06-25 NOTE — H&P (Addendum)
[name]    MR#:  [id number]  DATE OF BIRTH:  [birth year]  DATE OF ADMISSION:  06/25/2015  PRIMARY CARE PHYSICIAN: No primary care provider on file.   REQUESTING/REFERRING PHYSICIAN: Quale  CHIEF COMPLAINT:  Rectal bleed  HISTORY OF PRESENT ILLNESS:  Katherine [name]  is a 70 y.o. female with a known history of internal hemorrhoids, essential hypertension, hyperlipidemia and hypothyroidism is presenting to the ED with a chief complaint of lower GI bleed. Patient is noticing blood in her diarrhea. Denies any nausea vomiting. Reporting lower abdominal pain. CT abdomen has revealed colitis with infectious are intermittently etiology. Denies any history of diverticulitis in the past. Stool for C. difficile toxin is ordered in the ED. Patient is resting comfortably during my examination and abdominal pain resolved after taking pain medicine in the ED  PAST MEDICAL HISTORY:   Past Medical History  Diagnosis Date  . Hypertension    diabetes mellitus, hypothyroidism  PAST SURGICAL HISTOIRY:   Past Surgical History  Procedure Laterality Date  . Cholecystectomy      SOCIAL HISTORY:   Social History  Substance Use Topics  . Smoking status: Former Research scientist (life sciences)  . Smokeless tobacco: Not on file  . Alcohol Use: Yes    FAMILY HISTORY:  Diabetes and hypertension runs in her parent's  DRUG ALLERGIES:  No Known Allergies  REVIEW OF SYSTEMS:  CONSTITUTIONAL: No fever, fatigue or weakness.  EYES: No blurred or double vision.  EARS, NOSE, AND THROAT: No tinnitus or ear pain.  RESPIRATORY: No cough, shortness of breath, wheezing or hemoptysis.  CARDIOVASCULAR: No chest pain, orthopnea, edema.  GASTROINTESTINAL: No nausea, vomiting, diarrhea or abdominal pain.  GENITOURINARY: No dysuria, hematuria.  ENDOCRINE: No polyuria, nocturia,  HEMATOLOGY: No anemia, easy bruising or bleeding SKIN: No rash or  lesion. MUSCULOSKELETAL: No joint pain or arthritis.   NEUROLOGIC: No tingling, numbness, weakness.  PSYCHIATRY: No anxiety or depression.   MEDICATIONS AT HOME:   Prior to Admission medications   Medication Sig Start Date End Date Taking? Authorizing Provider  amLODipine (NORVASC) 10 MG tablet Take 10 mg by mouth at bedtime.    Yes Historical Provider, MD  cholecalciferol (VITAMIN D) 1000 units tablet Take 1,000 Units by mouth daily.   Yes Historical Provider, MD  Colesevelam HCl (WELCHOL) 3.75 g PACK Take 3.75 g by mouth 2 (two) times a week. Pt takes on Tuesday and Friday.   Yes Historical Provider, MD  glyBURIDE-metformin (GLUCOVANCE) 5-500 MG tablet Take 2 tablets by mouth 2 (two) times daily with a meal.    Yes Historical Provider, MD  levothyroxine (SYNTHROID, LEVOTHROID) 150 MCG tablet Take 150 mcg by mouth at bedtime.   Yes Historical Provider, MD  liothyronine (CYTOMEL) 50 MCG tablet Take 50 mcg by mouth at bedtime.    Yes Historical Provider, MD  lisinopril-hydrochlorothiazide (PRINZIDE,ZESTORETIC) 20-12.5 MG tablet Take 2 tablets by mouth daily.   Yes Historical Provider, MD  Multiple Vitamin (MULTIVITAMIN WITH MINERALS) TABS tablet Take 1 tablet by mouth daily.   Yes Historical Provider, MD  pioglitazone (ACTOS) 30 MG tablet Take 30 mg by mouth at bedtime.    Yes Historical Provider, MD  rivaroxaban (XARELTO) 20 MG TABS tablet Take 20 mg by mouth daily with supper.   Yes Historical Provider, MD  sitaGLIPtin (JANUVIA) 100 MG tablet Take 100 mg by mouth daily.   Yes Historical Provider, MD      VITAL SIGNS:  Blood pressure 118/55, pulse 73, temperature 97.9 F (36.6 C), temperature source Oral, resp. rate 18, height 5\' 7"  (1.702 m), weight 108.863 kg (240 lb), SpO2 99 %.  PHYSICAL EXAMINATION:  GENERAL:  70 y.o.-year-old patient lying in the bed with no acute distress.  EYES: Pupils equal, round, reactive to light and accommodation. No scleral icterus. Extraocular muscles  intact.  HEENT: Head atraumatic, normocephalic. Oropharynx and nasopharynx clear.  NECK:  Supple, no jugular venous distention. No thyroid enlargement, no tenderness.  LUNGS: Normal breath sounds bilaterally, no wheezing, rales,rhonchi or crepitation. No use of accessory muscles of respiration.  CARDIOVASCULAR: S1, S2 normal. No murmurs, rubs, or gallops.  ABDOMEN: Soft, nontender, has lower abdominal discomfort, nondistended. Bowel sounds present. No organomegaly or mass.  EXTREMITIES: No pedal edema, cyanosis, or clubbing.  NEUROLOGIC: Cranial nerves II through XII are intact. Muscle strength 5/5 in all extremities. Sensation intact. Gait not checked.  PSYCHIATRIC: The patient is alert and oriented x 3.  SKIN: No obvious rash, lesion, or ulcer.   LABORATORY PANEL:   CBC  Recent Labs Lab 06/25/15 1538  WBC 9.7  HGB 13.4  HCT 39.9  PLT 216   ------------------------------------------------------------------------------------------------------------------  Chemistries   Recent Labs Lab 06/25/15 1538  NA 131*  K 3.8  CL 97*  CO2 25  GLUCOSE 281*  BUN 21*  CREATININE 1.20*  CALCIUM 9.2  AST 30  ALT 22  ALKPHOS 66  BILITOT 0.4   ------------------------------------------------------------------------------------------------------------------  Cardiac Enzymes No results for input(s): TROPONINI in the last 168 hours. ------------------------------------------------------------------------------------------------------------------  RADIOLOGY:  Ct Cta Abd/pel W/cm &/or W/o Cm  06/25/2015  CLINICAL DATA:  70 year old female with left lower quadrant abdominal pain and GI bleed. Concern for ischemic colitis for diverticulitis. EXAM: CTA ABDOMEN AND PELVIS wITHOUT AND WITH CONTRAST TECHNIQUE: Multidetector CT imaging of the abdomen and pelvis was performed using the standard protocol during bolus administration of intravenous contrast. Multiplanar reconstructed images and MIPs  were obtained and reviewed to evaluate the vascular anatomy. CONTRAST:  135mL OMNIPAQUE IOHEXOL 350 MG/ML SOLN COMPARISON:  None. FINDINGS: The visualized lung bases are clear. No intra-abdominal free air. Small free fluid within the pelvis. Cholecystectomy. Mild biliary ductal dilatation, likely post cholecystectomy. The liver, pancreas, spleen, adrenal glands, kidneys, visualized ureters, and urinary bladder appear unremarkable. There is apparent haziness of the wall of the bladder likely reactive to inflammatory changes of the colon. The uterus appears grossly unremarkable. Surgical clips noted in the region of the left adnexa. There is circumferential thickening of the distal descending colon as well as thickening and inflammatory changes of the sigmoid compatible with colitis. No pneumatosis identified. There is no evidence of bowel obstruction. Normal appendix. 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 The origins of the celiac axis, SMA, IMA as well as the origins of the renal arteries are patent. There is an accessory left hepatic artery arising from the left gastric artery. There is a replaced right hepatic artery arising from the SMA. No portal venous gas identified. The SMV and main portal veins appear patent. There is no adenopathy. There is a midline vertical anterior abdominal wall incisional scar. Mild degenerative changes of the spine. No acute fracture. Review of the MIP images confirms the above findings. IMPRESSION: Colitis of the descending and sigmoid colon, likely inflammatory or infectious in etiology. There is no pneumatosis or portal venous gas. An ischemic colitis is less likely. Correlation with clinical exam and stool cultures recommended. Electronically Signed   By: Laren Everts.D.  On: 06/25/2015 20:00    EKG:   Orders placed or performed in visit on 03/18/12  . EKG 12-Lead    IMPRESSION AND PLAN:   #Acute lower abdominal pain with lower GI bleed-infectious or  inflammatory etiology per CT abdomen with history of internal hemorrhoids Admit to MedSurg floor Monitor hemoglobin and hematocrit closely Stool for C. difficile toxin is ordered which is pending at this time Patient will be on enteric precautions We will give her levofloxacin and Flagyl  GI consult is placed , as per ED physician discussion with gastroenterology considering colonoscopy in a.m., Nothing by mouth Holding Xarelto-not quite clear why patient is on this   #Essential hypertension resume her home medications and monitor blood pressure closely Lisinopril and hydrochlorothiazide  #History of diabetes mellitus non-insulin-dependent Nothing by mouth Check hemoglobin A1c in a.m. and sliding scale insulin will be provided Hold oral hypoglycemics   #Chronic history of hypothyroidism Resume Synthroid  Provide GI prophylaxis DVT prophylaxis with SCDs, holding Xarelto   All the records are reviewed and case discussed with ED provider. Management plans discussed with the patient, family and they are in agreement.  CODE STATUS:fc, husband is the healthcare power of attorney  TOTAL TIME TAKING CARE OF THIS PATIENT: 45 minutes.    Nicholes Mango M.D on 06/25/2015 at 9:27 PM  Between 7am to 6pm - Pager - [phone]  After 6pm go to [email or web address] - password [email or web address] [facility]  [facility]  Office  [phone]  CC: Primary care physician; No primary care provider on file.

## 2015-06-25 NOTE — ED Provider Notes (Signed)
[facility] Emergency Department Provider Note  ____________________________________________  Time seen: Approximately 8:22 PM  I have reviewed the triage vital signs and the nursing notes.   HISTORY  Chief Complaint Rectal Bleeding    HPI Katherine [name] is a 70 y.o. female presents for evaluation of left lower quadrant pain associated crampy feeling in bloody stools 6 since about yesterday. She is on Xarelto.  Moderate pain in left lower quadrant crampy at times associated with bleeding thereafter. No fevers chills nausea vomiting. No recent long trip or travel. Denies chest pain or trouble breathing.  Bleeding currently, but frequent cramping and then urge with  Blood in stool.  Last took her Xarelto yesterday  Past Medical History  Diagnosis Date  . Hypertension     Patient Active Problem List   Diagnosis Date Noted  . Lower GI bleed 06/25/2015    Past Surgical History  Procedure Laterality Date  . Cholecystectomy      Current Outpatient Rx  Name  Route  Sig  Dispense  Refill  . amLODipine (NORVASC) 10 MG tablet   Oral   Take 10 mg by mouth at bedtime.          . cholecalciferol (VITAMIN D) 1000 units tablet   Oral   Take 1,000 Units by mouth daily.         . Colesevelam HCl (WELCHOL) 3.75 g PACK   Oral   Take 3.75 g by mouth 2 (two) times a week. Pt takes on Tuesday and Friday.         . glyBURIDE-metformin (GLUCOVANCE) 5-500 MG tablet   Oral   Take 2 tablets by mouth 2 (two) times daily with a meal.          . levothyroxine (SYNTHROID, LEVOTHROID) 150 MCG tablet   Oral   Take 150 mcg by mouth at bedtime.         Marland Kitchen liothyronine (CYTOMEL) 50 MCG tablet   Oral   Take 50 mcg by mouth at bedtime.          Marland Kitchen lisinopril-hydrochlorothiazide (PRINZIDE,ZESTORETIC) 20-12.5 MG tablet   Oral   Take 2 tablets by mouth daily.         . Multiple Vitamin (MULTIVITAMIN WITH MINERALS) TABS tablet   Oral   Take 1 tablet  by mouth daily.         . pioglitazone (ACTOS) 30 MG tablet   Oral   Take 30 mg by mouth at bedtime.          . rivaroxaban (XARELTO) 20 MG TABS tablet   Oral   Take 20 mg by mouth daily with supper.         . sitaGLIPtin (JANUVIA) 100 MG tablet   Oral   Take 100 mg by mouth daily.           Allergies Review of patient's allergies indicates no known allergies.  No family history on file.  Social History Social History  Substance Use Topics  . Smoking status: Former Research scientist (life sciences)  . Smokeless tobacco: None  . Alcohol Use: Yes    Review of Systems Constitutional: No fever/chills Eyes: No visual changes. ENT: No sore throat. Cardiovascular: Denies chest pain. Respiratory: Denies shortness of breath. Gastrointestinal: No vomiting. No constipation. Genitourinary: Negative for dysuria. Musculoskeletal: Negative for back pain. Skin: Negative for rash. Neurological: Negative for headaches, focal weakness or numbness.  10-point ROS otherwise negative.  ____________________________________________   PHYSICAL EXAM:  VITAL SIGNS: ED Triage  Vitals  Enc Vitals Group     BP 06/25/15 1530 136/60 mmHg     Pulse Rate 06/25/15 1530 86     Resp 06/25/15 1530 20     Temp 06/25/15 1530 97.9 F (36.6 C)     Temp Source 06/25/15 1530 Oral     SpO2 06/25/15 1530 98 %     Weight 06/25/15 1530 240 lb (108.863 kg)     Height 06/25/15 1530 5\' 7"  (1.702 m)     Head Cir --      Peak Flow --      Pain Score 06/25/15 1534 8     Pain Loc --      Pain Edu? --      Excl. in [place]? --    Constitutional: Alert and oriented. Well appearing and in no acute distress. Eyes: Conjunctivae are normal. PERRL. EOMI. Head: Atraumatic. Nose: No congestion/rhinnorhea. Mouth/Throat: Mucous membranes are moist.  Oropharynx non-erythematous. Neck: No stridor.   Cardiovascular: Normal rate, irregular rhythm. Grossly normal heart sounds.  Good peripheral circulation. Respiratory: Normal respiratory  effort.  No retractions. Lungs CTAB. Gastrointestinal: Soft and nontender except for moderate tenderness without peritoneal signs of the left lower quadrant. No distention. No abdominal bruits. Musculoskeletal: No lower extremity tenderness nor edema.  No joint effusions. Neurologic:  Normal speech and language. No gross focal neurologic deficits are appreciated. No gait instability. Skin:  Skin is warm, dry and intact. No rash noted. Psychiatric: Mood and affect are normal. Speech and behavior are normal.  ____________________________________________   LABS (all labs ordered are listed, but only abnormal results are displayed)  Labs Reviewed  COMPREHENSIVE METABOLIC PANEL - Abnormal; Notable for the following:    Sodium 131 (*)    Chloride 97 (*)    Glucose, Bld 281 (*)    BUN 21 (*)    Creatinine, Ser 1.20 (*)    GFR calc non Af Amer 46 (*)    GFR calc Af Amer 53 (*)    All other components within normal limits  CBC - Abnormal; Notable for the following:    RDW 17.9 (*)    All other components within normal limits  PROTIME-INR - Abnormal; Notable for the following:    Prothrombin Time 16.6 (*)    All other components within normal limits  GLUCOSE, CAPILLARY - Abnormal; Notable for the following:    Glucose-Capillary 109 (*)    All other components within normal limits  GASTROINTESTINAL PANEL BY PCR, STOOL (REPLACES STOOL CULTURE)  C DIFFICILE QUICK SCREEN W PCR REFLEX  APTT  HEMOGLOBIN AND HEMATOCRIT, BLOOD  HEMOGLOBIN AND HEMATOCRIT, BLOOD  TYPE AND SCREEN  ABO/RH   ____________________________________________  EKG   ____________________________________________  RADIOLOGY  CT CTA Abd/Pel w/cm &/or w/o cm (Final result) Result time: 06/25/15 20:00:47   Final result by Rad Results In Interface (06/25/15 20:00:47)   Narrative:   CLINICAL DATA: 70 year old female with left lower quadrant abdominal pain and GI bleed. Concern for ischemic colitis  for diverticulitis.  EXAM: CTA ABDOMEN AND PELVIS wITHOUT AND WITH CONTRAST  TECHNIQUE: Multidetector CT imaging of the abdomen and pelvis was performed using the standard protocol during bolus administration of intravenous contrast. Multiplanar reconstructed images and MIPs were obtained and reviewed to evaluate the vascular anatomy.  CONTRAST: 133mL OMNIPAQUE IOHEXOL 350 MG/ML SOLN  COMPARISON: None.  FINDINGS: The visualized lung bases are clear. No intra-abdominal free air. Small free fluid within the pelvis.  Cholecystectomy. Mild biliary ductal dilatation, likely post  cholecystectomy. The liver, pancreas, spleen, adrenal glands, kidneys, visualized ureters, and urinary bladder appear unremarkable. There is apparent haziness of the wall of the bladder likely reactive to inflammatory changes of the colon. The uterus appears grossly unremarkable. Surgical clips noted in the region of the left adnexa.  There is circumferential thickening of the distal descending colon as well as thickening and inflammatory changes of the sigmoid compatible with colitis. No pneumatosis identified. There is no evidence of bowel obstruction. Normal appendix.  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 The origins of the celiac axis, SMA, IMA as well as the origins of the renal arteries are patent. There is an accessory left hepatic artery arising from the left gastric artery. There is a replaced right hepatic artery arising from the SMA. No portal venous gas identified. The SMV and main portal veins appear patent. There is no adenopathy.  There is a midline vertical anterior abdominal wall incisional scar. Mild degenerative changes of the spine. No acute fracture.  Review of the MIP images confirms the above findings.  IMPRESSION: Colitis of the descending and sigmoid colon, likely inflammatory or infectious in etiology. There is no pneumatosis or portal venous gas. An ischemic  colitis is less likely. Correlation with clinical exam and stool cultures recommended.   Electronically Signed By: Anner Crete M.D. On: 06/25/2015 20:00       ____________________________________________   PROCEDURES  Procedure(s) performed: None  Critical Care performed: No  ____________________________________________   INITIAL IMPRESSION / ASSESSMENT AND PLAN / ED COURSE  Pertinent labs & imaging results that were available during my care of the patient were reviewed by me and considered in my medical decision making (see chart for details).  Left lower quadrant crampy abdominal pain with bleeding. The patient is on Xarelto and her INR is slightly elevated lately signaling presents active Xarelto. Her symptoms are concerning for multiple kinds of colitis including ischemic, infectious, and probably less likely inflammatory. Case and care discussed with Dr. Anastasio Champion advises that the patient must be admitted because of her anticoagulation status though her hemodynamics and hemoglobin are stable. She will gastroenterology consult tomorrow via advises starting empirically on Cipro and Flagyl as well as checking stool cultures.  The patient reports significant improvement feeling much better after Dilaudid. She is awake alert in no distress. The hospitalist service for ongoing workup for GI bleeding while on anticoagulation and colitis. ____________________________________________   FINAL CLINICAL IMPRESSION(S) / ED DIAGNOSES  Final diagnoses:  Lower GI bleed  Colitis      Delman Kitten, MD 06/25/15 2211

## 2015-06-25 NOTE — ED Notes (Signed)
Rectal bleeding since yesterday. States originally was blood with stool, now just blood, bright red.

## 2015-06-26 LAB — CBC
HCT: 36.3 % (ref 35.0–47.0)
HEMOGLOBIN: 12.3 g/dL (ref 12.0–16.0)
MCH: 29.1 pg (ref 26.0–34.0)
MCHC: 33.7 g/dL (ref 32.0–36.0)
MCV: 86.1 fL (ref 80.0–100.0)
Platelets: 192 10*3/uL (ref 150–440)
RBC: 4.22 MIL/uL (ref 3.80–5.20)
RDW: 18.2 % — ABNORMAL HIGH (ref 11.5–14.5)
WBC: 8.6 10*3/uL (ref 3.6–11.0)

## 2015-06-26 LAB — COMPREHENSIVE METABOLIC PANEL
ALT: 18 U/L (ref 14–54)
AST: 19 U/L (ref 15–41)
Albumin: 3.3 g/dL — ABNORMAL LOW (ref 3.5–5.0)
Alkaline Phosphatase: 56 U/L (ref 38–126)
Anion gap: 5 (ref 5–15)
BUN: 16 mg/dL (ref 6–20)
CHLORIDE: 99 mmol/L — AB (ref 101–111)
CO2: 26 mmol/L (ref 22–32)
Calcium: 8.3 mg/dL — ABNORMAL LOW (ref 8.9–10.3)
Creatinine, Ser: 1.13 mg/dL — ABNORMAL HIGH (ref 0.44–1.00)
GFR, EST AFRICAN AMERICAN: 57 mL/min — AB (ref >60)
GFR, EST NON AFRICAN AMERICAN: 49 mL/min — AB (ref >60)
Glucose, Bld: 124 mg/dL — ABNORMAL HIGH (ref 65–99)
POTASSIUM: 3.5 mmol/L (ref 3.5–5.1)
SODIUM: 130 mmol/L — AB (ref 135–145)
Total Bilirubin: 0.9 mg/dL (ref 0.3–1.2)
Total Protein: 6.2 g/dL — ABNORMAL LOW (ref 6.5–8.1)

## 2015-06-26 LAB — GLUCOSE, CAPILLARY
GLUCOSE-CAPILLARY: 184 mg/dL — AB (ref 65–99)
Glucose-Capillary: 137 mg/dL — ABNORMAL HIGH (ref 65–99)
Glucose-Capillary: 206 mg/dL — ABNORMAL HIGH (ref 65–99)
Glucose-Capillary: 99 mg/dL (ref 65–99)

## 2015-06-26 LAB — PROTIME-INR
INR: 1.24
PROTHROMBIN TIME: 15.8 s — AB (ref 11.4–15.0)

## 2015-06-26 LAB — HEMOGLOBIN A1C: HEMOGLOBIN A1C: 7.4 % — AB (ref 4.0–6.0)

## 2015-06-26 MED ORDER — CIPROFLOXACIN IN D5W 400 MG/200ML IV SOLN
400.0000 mg | Freq: Two times a day (BID) | INTRAVENOUS | Status: DC
  Administered 2015-06-26 – 2015-06-27 (×3): 400 mg via INTRAVENOUS
  Filled 2015-06-26 (×4): qty 200

## 2015-06-26 MED ORDER — METRONIDAZOLE IN NACL 5-0.79 MG/ML-% IV SOLN
500.0000 mg | Freq: Three times a day (TID) | INTRAVENOUS | Status: DC
  Administered 2015-06-26 – 2015-06-27 (×4): 500 mg via INTRAVENOUS
  Filled 2015-06-26 (×6): qty 100

## 2015-06-26 MED ORDER — SODIUM CHLORIDE 0.9 % IV SOLN
INTRAVENOUS | Status: AC
Start: 2015-06-26 — End: 2015-06-27
  Administered 2015-06-26 (×2): via INTRAVENOUS

## 2015-06-26 MED ORDER — CHLORHEXIDINE GLUCONATE 0.12 % MT SOLN
15.0000 mL | Freq: Two times a day (BID) | OROMUCOSAL | Status: DC
  Administered 2015-06-26 – 2015-06-27 (×4): 15 mL via OROMUCOSAL
  Filled 2015-06-26 (×3): qty 15

## 2015-06-26 MED ORDER — CETYLPYRIDINIUM CHLORIDE 0.05 % MT LIQD: 7.0000 mL | Freq: Two times a day (BID) | OROMUCOSAL | Status: DC

## 2015-06-26 NOTE — Progress Notes (Signed)
[facility] [place] NAME: Katherine [name]    MR#:  [id number]  DATE OF BIRTH:  01-11-1946  SUBJECTIVE:  No complaints this morning, no episodes of diarrhea since admission  REVIEW OF SYSTEMS:  CONSTITUTIONAL: No fever, fatigue or weakness.  EYES: No blurred or double vision.  EARS, NOSE, AND THROAT: No tinnitus or ear pain.  RESPIRATORY: No cough, shortness of breath, wheezing or hemoptysis.  CARDIOVASCULAR: No chest pain, orthopnea, edema.  GASTROINTESTINAL: No nausea, vomiting, diarrhea or abdominal pain.  GENITOURINARY: No dysuria, hematuria.  ENDOCRINE: No polyuria, nocturia,  HEMATOLOGY: No anemia, easy bruising or bleeding SKIN: No rash or lesion. MUSCULOSKELETAL: No joint pain or arthritis.   NEUROLOGIC: No tingling, numbness, weakness.  PSYCHIATRY: No anxiety or depression.   DRUG ALLERGIES:  No Known Allergies  VITALS:  Blood pressure 121/53, pulse 75, temperature 97.9 F (36.6 C), temperature source Oral, resp. rate 20, height 5\' 7"  (1.702 m), weight 108.863 kg (240 lb), SpO2 97 %.  PHYSICAL EXAMINATION:  VITAL SIGNS: Filed Vitals:   06/25/15 2232 06/26/15 0534  BP: 118/42 121/53  Pulse: 72 75  Temp: 98 F (36.7 C) 97.9 F (36.6 C)  Resp: 20 20   GENERAL:70 y.o.female currently in no acute distress.  HEAD: Normocephalic, atraumatic.  EYES: Pupils equal, round, reactive to light. Extraocular muscles intact. No scleral icterus.  MOUTH: Moist mucosal membrane. Dentition intact. No abscess noted.  EAR, NOSE, THROAT: Clear without exudates. No external lesions.  NECK: Supple. No thyromegaly. No nodules. No JVD.  PULMONARY: Clear to ascultation, without wheeze rails or rhonci. No use of accessory muscles, Good respiratory effort. good air entry bilaterally CHEST: Nontender to palpation.  CARDIOVASCULAR: S1 and S2. Regular rate and rhythm. No murmurs, rubs, or gallops. No edema. Pedal pulses 2+ bilaterally.   GASTROINTESTINAL: Soft, nontender, nondistended. No masses. Positive bowel sounds. No hepatosplenomegaly.  MUSCULOSKELETAL: No swelling, clubbing, or edema. Range of motion full in all extremities.  NEUROLOGIC: Cranial nerves II through XII are intact. No gross focal neurological deficits. Sensation intact. Reflexes intact.  SKIN: No ulceration, lesions, rashes, or cyanosis. Skin warm and dry. Turgor intact.  PSYCHIATRIC: Mood, affect within normal limits. The patient is awake, alert and oriented x 3. Insight, judgment intact.      LABORATORY PANEL:   CBC  Recent Labs Lab 06/26/15 0517  WBC 8.6  HGB 12.3  HCT 36.3  PLT 192   ------------------------------------------------------------------------------------------------------------------  Chemistries   Recent Labs Lab 06/26/15 0517  NA 130*  K 3.5  CL 99*  CO2 26  GLUCOSE 124*  BUN 16  CREATININE 1.13*  CALCIUM 8.3*  AST 19  ALT 18  ALKPHOS 56  BILITOT 0.9   ------------------------------------------------------------------------------------------------------------------  Cardiac Enzymes No results for input(s): TROPONINI in the last 168 hours. ------------------------------------------------------------------------------------------------------------------  RADIOLOGY:  Ct Cta Abd/pel W/cm &/or W/o Cm  06/25/2015  CLINICAL DATA:  70 year old female with left lower quadrant abdominal pain and GI bleed. Concern for ischemic colitis for diverticulitis. EXAM: CTA ABDOMEN AND PELVIS wITHOUT AND WITH CONTRAST TECHNIQUE: Multidetector CT imaging of the abdomen and pelvis was performed using the standard protocol during bolus administration of intravenous contrast. Multiplanar reconstructed images and MIPs were obtained and reviewed to evaluate the vascular anatomy. CONTRAST:  142mL OMNIPAQUE IOHEXOL 350 MG/ML SOLN COMPARISON:  None. FINDINGS: The visualized lung bases are clear. No intra-abdominal free air. Small free  fluid within the pelvis. Cholecystectomy. Mild biliary ductal dilatation, likely post cholecystectomy. The  liver, pancreas, spleen, adrenal glands, kidneys, visualized ureters, and urinary bladder appear unremarkable. There is apparent haziness of the wall of the bladder likely reactive to inflammatory changes of the colon. The uterus appears grossly unremarkable. Surgical clips noted in the region of the left adnexa. There is circumferential thickening of the distal descending colon as well as thickening and inflammatory changes of the sigmoid compatible with colitis. No pneumatosis identified. There is no evidence of bowel obstruction. Normal appendix. 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 The origins of the celiac axis, SMA, IMA as well as the origins of the renal arteries are patent. There is an accessory left hepatic artery arising from the left gastric artery. There is a replaced right hepatic artery arising from the SMA. No portal venous gas identified. The SMV and main portal veins appear patent. There is no adenopathy. There is a midline vertical anterior abdominal wall incisional scar. Mild degenerative changes of the spine. No acute fracture. Review of the MIP images confirms the above findings. IMPRESSION: Colitis of the descending and sigmoid colon, likely inflammatory or infectious in etiology. There is no pneumatosis or portal venous gas. An ischemic colitis is less likely. Correlation with clinical exam and stool cultures recommended. Electronically Signed   By: Anner Crete M.D.   On: 06/25/2015 20:00    EKG:   Orders placed or performed in visit on 03/18/12  . EKG 12-Lead    ASSESSMENT AND PLAN:   70 year old Caucasian female admitted 06/25/15 with bright red blood per rectum  1. Bright red blood per rectum/colitis unspecified: Question infectious etiology C. difficile is pending however, I doubt this is the case as the patient has had no further episodes I changed  antibiotic coverage to Cipro/Flagyl. Increase diet to full liquid, gastroenterology consult pending at this time if able to tolerate diet will change antibiotics to oral and likely discharge tomorrow. 2. Type 2 diabetes non-insulin-requiring: Hold oral agents continue sliding scale coverage 3. Hypothyroidism unspecified: Synthroid 4. Essential hypertension Norvasc 5. Venous thromboembolism prophylactic: SCDs     All the records are reviewed and case discussed with Care Management/Social Workerr. Management plans discussed with the patient, family and they are in agreement.  CODE STATUS: Full  TOTAL TIME TAKING CARE OF THIS PATIENT: 31 minutes.   POSSIBLE D/C IN 1 DAYS, DEPENDING ON CLINICAL CONDITION.   Lun Muro,  Karenann Cai.D on 06/26/2015 at 11:49 AM  Between 7am to 6pm - Pager - [phone]  After 6pm: House Pager: - [phone]  [facility]  Office  [phone]  CC: Primary care physician; No primary care provider on file.

## 2015-06-26 NOTE — Care Management Obs Status (Signed)
[place] NOTIFICATION   Patient Details  Name: Katherine [name] MRN: [id number] Date of Birth: [birth year]   Medicare Observation Status Notification Given:  Yes    Beau Fanny, RN 06/26/2015, 10:18 AM

## 2015-06-26 NOTE — Care Management Note (Signed)
Case Management Note  Patient Details  Name: Katherine [name] MRN: [id number] Date of Birth: 12/30/1945  Subjective/Objective: Code 44 documentation submitted. Pt. On isolation so no signature obtained.                   Action/Plan:   Expected Discharge Date:  06/27/15               Expected Discharge Plan:     In-House Referral:     Discharge planning Services     Post Acute Care Choice:    Choice offered to:     DME Arranged:    DME Agency:     HH Arranged:    [place] Agency:     Status of Service:     Medicare Important Message Given:    Date Medicare IM Given:    Medicare IM give by:    Date Additional Medicare IM Given:    Additional Medicare Important Message give by:     If discussed at [place] of Stay Meetings, dates discussed:    Additional Comments:  Beau Fanny, RN 06/26/2015, 10:18 AM

## 2015-06-26 NOTE — Consult Note (Signed)
GI Inpatient Consult Note  Reason for Consult: Lower GI bleed   Attending Requesting Consult: [place]  History of Present Illness: Katherine [name] is a 70 y.o. female with a known hx of DM, HTN, HLD, and paroxysmal a fib (on Xarelto) admitted with a lower GIB. She presented to the [facility] ED on 06/25/15 after a day of diarrhea and hematochezia. On Sunday 06/24/15 she experienced acute onset of lower abdominal cramping. This was immediately followed by an urgent, loose BM with frank blood.  She endorses dizziness, diaphoresis, and near-syncope. Her husband helped her to the car, and she continued to have episodes of stomach cramping, urgency, and bloody BMs for the next 24 hours.  She reports 6, large volume bloody, loose BMs Sunday.  She presented to [facility] on Monday d/t lack of improvement.  Labs: BUN 21-->16, Cr 1.20-->1.13, Na 131-->130, Glucose 281-->124, Protein 6.2, Alb 3.3  CTA: colitis in descending and sigmoid colon  Since admission, patient continues to experience lower abdominal crmaping and pass BRBPR; however, she has not had another BM since admission. She has changed her clothes several times b/c of the volume of blood.  Per patient, her BP is lower than normal and she has been urinating less than usual. She is on Xarelto - last dose 06/24/15. ?Today (06/26/15) she did not want this provider to palpate her lower abdomen d/t pain. She is currently on IV metronidazole. Patient denies any recent abx use, sick contacts, or foul smelling stools. She denies N/V, loss of appetite, or constipation or diarrhea prior to Sunday.  She has no Fhx of colon CA, rectal CA , or IBD to her knowledge.   Colonoscopy (08/02/09, Dr. Gustavo Lah): redundant colon, non-bleeding internal hemorrhoids, otherwise normal - recommend 10 year f/u    Past Medical History:  Past Medical History  Diagnosis Date  . Hypertension     Problem List: Patient Active Problem List   Diagnosis Date Noted  . Lower GI bleed  06/25/2015    Past Surgical History: Past Surgical History  Procedure Laterality Date  . Cholecystectomy      Allergies: No Known Allergies  Home Medications: Prescriptions prior to admission  Medication Sig Dispense Refill Last Dose  . amLODipine (NORVASC) 10 MG tablet Take 10 mg by mouth at bedtime.    06/24/2015 at Unknown time  . cholecalciferol (VITAMIN D) 1000 units tablet Take 1,000 Units by mouth daily.   06/25/2015 at Unknown time  . Colesevelam HCl (WELCHOL) 3.75 g PACK Take 3.75 g by mouth 2 (two) times a week. Pt takes on Tuesday and Friday.   06/22/2015 at unknown   . glyBURIDE-metformin (GLUCOVANCE) 5-500 MG tablet Take 2 tablets by mouth 2 (two) times daily with a meal.    06/25/2015 at Unknown time  . levothyroxine (SYNTHROID, LEVOTHROID) 150 MCG tablet Take 150 mcg by mouth at bedtime.   06/24/2015 at Unknown time  . liothyronine (CYTOMEL) 50 MCG tablet Take 50 mcg by mouth at bedtime.    06/24/2015 at Unknown time  . lisinopril-hydrochlorothiazide (PRINZIDE,ZESTORETIC) 20-12.5 MG tablet Take 2 tablets by mouth daily.   06/25/2015 at Unknown time  . Multiple Vitamin (MULTIVITAMIN WITH MINERALS) TABS tablet Take 1 tablet by mouth daily.   06/25/2015 at Unknown time  . pioglitazone (ACTOS) 30 MG tablet Take 30 mg by mouth at bedtime.    06/24/2015 at Unknown time  . rivaroxaban (XARELTO) 20 MG TABS tablet Take 20 mg by mouth daily with supper.   06/24/2015 at 1800  .  sitaGLIPtin (JANUVIA) 100 MG tablet Take 100 mg by mouth daily.   06/25/2015 at Unknown time   Home medication reconciliation was completed with the patient.   Scheduled Inpatient Medications:   . amLODipine  10 mg Oral QHS  . antiseptic oral rinse  7 mL Mouth Rinse q12n4p  . chlorhexidine  15 mL Mouth Rinse BID  . cholecalciferol  1,000 Units Oral Daily  . ciprofloxacin  400 mg Intravenous Q12H  . insulin aspart  0-5 Units Subcutaneous QHS  . insulin aspart  0-9 Units Subcutaneous TID WC  . levothyroxine  150 mcg  Oral QHS  . liothyronine  50 mcg Oral QHS  . metronidazole  500 mg Intravenous Q8H  . pantoprazole  40 mg Oral QAC breakfast    Continuous Inpatient Infusions:     PRN Inpatient Medications:  acetaminophen **OR** acetaminophen, morphine injection, ondansetron **OR** ondansetron (ZOFRAN) IV, oxyCODONE  Family History: family history is not on file.   Social History:   reports that she has quit smoking. She does not have any smokeless tobacco history on file. She reports that she drinks alcohol.   Review of Systems: Constitutional: Weight is stable.  Eyes: No changes in vision. ENT: No oral lesions, sore throat.  GI: see HPI.  Heme/Lymph: No easy bruising.  CV: No chest pain.  GU: No hematuria.  Integumentary: No rashes.  Neuro: No headaches.  Psych: No depression/anxiety.  Endocrine: No heat/cold intolerance.  Allergic/Immunologic: No urticaria.  Resp: No cough, SOB.  Musculoskeletal: No joint swelling.    Physical Examination: BP 121/48 mmHg  Pulse 74  Temp(Src) 97.9 F (36.6 C) (Oral)  Resp 18  Ht 5\' 7"  (1.702 m)  Wt 108.863 kg (240 lb)  BMI 37.58 kg/m2  SpO2 99% Gen: NAD, alert and oriented x 4 HEENT: PEERLA, EOMI, Neck: supple, no JVD or thyromegaly Chest: CTA bilaterally, no wheezes, crackles, or other adventitious sounds CV: RRR, no m/g/c/r Abd: soft, no TTP in RUQ, epigastrium, and LUQ (lower abdomen not examined d/t patient's pain), ND, +BS in all four quadrants; no HSM, guarding, ridigity, or rebound tenderness Ext: no edema, well perfused with 2+ pulses, Skin: no rash or lesions noted Lymph: no LAD  Data: Lab Results  Component Value Date   WBC 8.6 06/26/2015   HGB 12.3 06/26/2015   HCT 36.3 06/26/2015   MCV 86.1 06/26/2015   PLT 192 06/26/2015    Recent Labs Lab 06/25/15 1538 06/25/15 2130 06/26/15 0517  HGB 13.4 12.5 12.3   Lab Results  Component Value Date   NA 130* 06/26/2015   K 3.5 06/26/2015   CL 99* 06/26/2015   CO2 26  06/26/2015   BUN 16 06/26/2015   CREATININE 1.13* 06/26/2015   Lab Results  Component Value Date   ALT 18 06/26/2015   AST 19 06/26/2015   ALKPHOS 56 06/26/2015   BILITOT 0.9 06/26/2015    Recent Labs Lab 06/25/15 1538 06/26/15 0517  APTT 36  --   INR 1.33 1.24   Assessment/Plan: Ms. Honegger is a 70 y.o. female with a known hx of DM, HTN, HLD, and paroxysmal a fib (on Xarelto) admitted with a lower GIB.  Patient continues to experience lower abd cramping and rectal bleeding w/o further BMs.  With acute lower abdominal pain, diarrhea, and rectal bleeding, this is likely ischemic colitis.  CT findings of thickening of sigmoid and descending colon are also c/w this.  Patient also has a number of risk factors for ischemia including DM  II, HTN, HLD, and former smoker status.  Hgb remains stable and no leukocytosis noted.  At this time, watchful waiting over the next 24 hrs is recommended - if this is ischemic colitis, patient will likely continue to improve symptomatically.  If rectal bleeding and pain persist, a flex sig may be considered exclude other etiologies.  If patient has a BM, performing stool studies to exclude infectious etiologies is also recommended.  Will continue to follow with you.  Further recs per Dr. Rayann Heman.    Recommendations: - Continue conservative therapy and IV Flagyl for now - Obtain stool studies if patient has a BM - Will consider flex sig if significant pain and rectal bleeding persist over the next 24 hours  Thank you for the consult. We will follow along with you. Please call with questions or concerns.  Lavera Guise, PA-C [facility] Phone: [phone] Pager: [phone]

## 2015-06-27 LAB — GLUCOSE, CAPILLARY
GLUCOSE-CAPILLARY: 162 mg/dL — AB (ref 65–99)
Glucose-Capillary: 205 mg/dL — ABNORMAL HIGH (ref 65–99)

## 2015-06-27 MED ORDER — METRONIDAZOLE 500 MG PO TABS: 500.0000 mg | ORAL_TABLET | Freq: Three times a day (TID) | ORAL | Status: DC

## 2015-06-27 MED ORDER — CIPROFLOXACIN HCL 500 MG PO TABS: 500.0000 mg | ORAL_TABLET | Freq: Two times a day (BID) | ORAL | Status: DC

## 2015-06-27 MED ORDER — INFLUENZA VAC SPLIT QUAD 0.5 ML IM SUSY
0.5000 mL | PREFILLED_SYRINGE | INTRAMUSCULAR | Status: AC
  Administered 2015-06-27: 0.5 mL via INTRAMUSCULAR
  Filled 2015-06-27: qty 0.5

## 2015-06-27 NOTE — Discharge Summary (Signed)
Katherine Katherine [name] at [place] NAME: Katherine Katherine [name]    MR#:  [id number]  DATE OF BIRTH:  Sep 18, 1945  DATE OF ADMISSION:  06/25/2015 ADMITTING PHYSICIAN: Nicholes Mango, MD  DATE OF DISCHARGE: No discharge date for Katherine [name] encounter.  PRIMARY CARE PHYSICIAN: No primary care provider on file.    ADMISSION DIAGNOSIS:  Colitis [K52.9] Lower GI bleed [K92.2]  DISCHARGE DIAGNOSIS:  Colitis  SECONDARY DIAGNOSIS:   Past Medical History  Diagnosis Date  . Hypertension     HOSPITAL COURSE:  Katherine Katherine [name]  is a 70 y.o. female admitted 06/25/2015 with chief complaint diarrhea with bleed. Please see H&P performed by Dr. Margaretmary Eddy for further information. She was noted to have evidence of colitis on CAT scan on arrival to the hospital. No further episodes of bleeding. Evaluated by gastroenterology for this is possibly secondary to ischemic colitis however, as Katherine [name]'s symptoms improved no further intervention required at this time.    CONSULTS OBTAINED:  Treatment Team:  Josefine Class, MD  DRUG ALLERGIES:  No Known Allergies  DISCHARGE MEDICATIONS:   Current Discharge Medication List    START taking these medications   Details  ciprofloxacin (CIPRO) 500 MG tablet Take 1 tablet (500 mg total) by mouth 2 (two) times daily. Qty: 8 tablet, Refills: 0    metroNIDAZOLE (FLAGYL) 500 MG tablet Take 1 tablet (500 mg total) by mouth 3 (three) times daily. Qty: 12 tablet, Refills: 0      CONTINUE these medications which have NOT CHANGED   Details  amLODipine (NORVASC) 10 MG tablet Take 10 mg by mouth at bedtime.     cholecalciferol (VITAMIN D) 1000 units tablet Take 1,000 Units by mouth daily.    Colesevelam HCl (WELCHOL) 3.75 g PACK Take 3.75 g by mouth 2 (two) times a week. Pt takes on Tuesday and Friday.    glyBURIDE-metformin (GLUCOVANCE) 5-500 MG tablet Take 2 tablets by mouth 2 (two) times daily with a meal.     levothyroxine  (SYNTHROID, LEVOTHROID) 150 MCG tablet Take 150 mcg by mouth at bedtime.    liothyronine (CYTOMEL) 50 MCG tablet Take 50 mcg by mouth at bedtime.     lisinopril-hydrochlorothiazide (PRINZIDE,ZESTORETIC) 20-12.5 MG tablet Take 2 tablets by mouth daily.    Multiple Vitamin (MULTIVITAMIN WITH MINERALS) TABS tablet Take 1 tablet by mouth daily.    pioglitazone (ACTOS) 30 MG tablet Take 30 mg by mouth at bedtime.     sitaGLIPtin (JANUVIA) 100 MG tablet Take 100 mg by mouth daily.      STOP taking these medications     rivaroxaban (XARELTO) 20 MG TABS tablet          DISCHARGE INSTRUCTIONS:   Restart Xarelto after 1 week  DIET:  Regular diet  DISCHARGE CONDITION:  Stable  ACTIVITY:  Activity as tolerated  OXYGEN:  Home Oxygen: No.   Oxygen Delivery: room air  DISCHARGE LOCATION:  home   If you experience worsening of your admission symptoms, develop shortness of breath, life threatening emergency, suicidal or homicidal thoughts you must seek medical attention immediately by calling 911 or calling your MD immediately  if symptoms less severe.  You Must read complete instructions/literature along with all the possible adverse reactions/side effects for all the Medicines you take and that have been prescribed to you. Take any new Medicines after you have completely understood and accpet all the possible adverse reactions/side effects.   Please note  You were cared for by  a hospitalist during your hospital stay. If you have any questions about your discharge medications or the care you received while you were in the hospital after you are discharged, you can call the unit and asked to speak with the hospitalist on call if the hospitalist that took care of you is not available. Once you are discharged, your primary care physician will handle any further medical issues. Please note that NO REFILLS for any discharge medications will be authorized once you are discharged, as it is  imperative that you return to your primary care physician (or establish a relationship with a primary care physician if you do not have one) for your aftercare needs so that they can reassess your need for medications and monitor your lab values.    On the day of Discharge:   VITAL SIGNS:  Blood pressure 130/57, pulse 78, temperature 98.1 F (36.7 C), temperature source Oral, resp. rate 20, height 5\' 7"  (1.702 m), weight 108.863 kg (240 lb), SpO2 97 %.  I/O:   Intake/Output Summary (Last 24 hours) at 06/27/15 1206 Last data filed at 06/27/15 1050  Gross per 24 hour  Intake   2627 ml  Output   2200 ml  Net    427 ml    PHYSICAL EXAMINATION:  GENERAL:  Katherine Katherine [name] lying in the bed with no acute distress.  EYES: Pupils equal, round, reactive to light and accommodation. No scleral icterus. Extraocular muscles intact.  HEENT: Head atraumatic, normocephalic. Oropharynx and nasopharynx clear.  NECK:  Supple, no jugular venous distention. No thyroid enlargement, no tenderness.  LUNGS: Normal breath sounds bilaterally, no wheezing, rales,rhonchi or crepitation. No use of accessory muscles of respiration.  CARDIOVASCULAR: S1, S2 normal. No murmurs, rubs, or gallops.  ABDOMEN: Soft, non-tender, non-distended. Bowel sounds present. No organomegaly or mass.  EXTREMITIES: No pedal edema, cyanosis, or clubbing.  NEUROLOGIC: Cranial nerves II through XII are intact. Muscle strength 5/5 in all extremities. Sensation intact. Gait not checked.  PSYCHIATRIC: The Katherine [name] is alert and oriented x 3.  SKIN: No obvious rash, lesion, or ulcer.   DATA REVIEW:   CBC  Recent Labs Lab 06/26/15 0517  WBC 8.6  HGB 12.3  HCT 36.3  PLT 192    Chemistries   Recent Labs Lab 06/26/15 0517  NA 130*  K 3.5  CL 99*  CO2 26  GLUCOSE 124*  BUN 16  CREATININE 1.13*  CALCIUM 8.3*  AST 19  ALT 18  ALKPHOS 56  BILITOT 0.9    Cardiac Enzymes No results for input(s): TROPONINI in the  last 168 hours.  Microbiology Results  No results found for this or any previous visit.  RADIOLOGY:  Ct Cta Abd/pel W/cm &/or W/o Cm  06/25/2015  CLINICAL DATA:  70 year old female with left lower quadrant abdominal pain and GI bleed. Concern for ischemic colitis for diverticulitis. EXAM: CTA ABDOMEN AND PELVIS wITHOUT AND WITH CONTRAST TECHNIQUE: Multidetector CT imaging of the abdomen and pelvis was performed using the standard protocol during bolus administration of intravenous contrast. Multiplanar reconstructed images and MIPs were obtained and reviewed to evaluate the vascular anatomy. CONTRAST:  128mL OMNIPAQUE IOHEXOL 350 MG/ML SOLN COMPARISON:  None. FINDINGS: The visualized lung bases are clear. No intra-abdominal free air. Small free fluid within the pelvis. Cholecystectomy. Mild biliary ductal dilatation, likely post cholecystectomy. The liver, pancreas, spleen, adrenal glands, kidneys, visualized ureters, and urinary bladder appear unremarkable. There is apparent haziness of the wall of the bladder likely reactive to inflammatory  changes of the colon. The uterus appears grossly unremarkable. Surgical clips noted in the region of the left adnexa. There is circumferential thickening of the distal descending colon as well as thickening and inflammatory changes of the sigmoid compatible with colitis. No pneumatosis identified. There is no evidence of bowel obstruction. Normal appendix. 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 The origins of the celiac axis, SMA, IMA as well as the origins of the renal arteries are patent. There is an accessory left hepatic artery arising from the left gastric artery. There is a replaced right hepatic artery arising from the SMA. No portal venous gas identified. The SMV and main portal veins appear patent. There is no adenopathy. There is a midline vertical anterior abdominal wall incisional scar. Mild degenerative changes of the spine. No acute fracture.  Review of the MIP images confirms the above findings. IMPRESSION: Colitis of the descending and sigmoid colon, likely inflammatory or infectious in etiology. There is no pneumatosis or portal venous gas. An ischemic colitis is less likely. Correlation with clinical exam and stool cultures recommended. Electronically Signed   By: Anner Crete M.D.   On: 06/25/2015 20:00     Management plans discussed with the Katherine [name], family and they are in agreement.  CODE STATUS:     Code Status Orders        Start     Ordered   06/25/15 2236  Full code   Continuous     06/25/15 2235    Code Status History    Date Active Date Inactive Code Status Order ID Comments User Context   This Katherine [name] has a current code status but no historical code status.    Advance Directive Documentation        Most Recent Value   Type of Advance Directive  Healthcare Power of Attorney   Pre-existing out of facility DNR order (yellow form or pink MOST form)     "MOST" Form in Place?        TOTAL TIME TAKING CARE OF THIS Katherine [name]: 28 minutes.    Jorita Bohanon,  Karenann Cai.D on 06/27/2015 at 12:06 PM  Between 7am to 6pm - Pager - [phone]  After 6pm go to [email or web address] - password [email or web address] [facility]  [facility]  Office  [phone]  CC: Primary care physician; No primary care provider on file.

## 2015-06-27 NOTE — Progress Notes (Signed)
Alert and oriented. Vss. No signs of acute distress. Discharge instructions given. Patient verbalizes understanding.

## 2015-06-27 NOTE — Discharge Instructions (Signed)
Restarts Xarelto after 1 week

## 2015-08-16 ENCOUNTER — Encounter: Payer: Self-pay | Admitting: *Deleted

## 2015-08-17 ENCOUNTER — Ambulatory Visit: Payer: Medicare Other | Admitting: Anesthesiology

## 2015-08-17 ENCOUNTER — Encounter
Admission: RE | Disposition: A | Discharge status: Home / Self Care | Payer: Self-pay | Source: Ambulatory Visit | Attending: Gastroenterology

## 2015-08-17 ENCOUNTER — Encounter: Payer: Self-pay | Admitting: Anesthesiology

## 2015-08-17 ENCOUNTER — Ambulatory Visit
Admission: RE | Admit: 2015-08-17 | Discharge: 2015-08-17 | Disposition: A | Discharge status: Home / Self Care | Payer: Medicare Other | Source: Ambulatory Visit | Attending: Gastroenterology | Admitting: Gastroenterology

## 2015-08-17 DIAGNOSIS — I10 Essential (primary) hypertension: Secondary | ICD-10-CM | POA: Insufficient documentation

## 2015-08-17 DIAGNOSIS — R933 Abnormal findings on diagnostic imaging of other parts of digestive tract: Secondary | ICD-10-CM | POA: Insufficient documentation

## 2015-08-17 DIAGNOSIS — E119 Type 2 diabetes mellitus without complications: Secondary | ICD-10-CM | POA: Diagnosis not present

## 2015-08-17 DIAGNOSIS — K640 First degree hemorrhoids: Secondary | ICD-10-CM | POA: Diagnosis not present

## 2015-08-17 DIAGNOSIS — E785 Hyperlipidemia, unspecified: Secondary | ICD-10-CM | POA: Insufficient documentation

## 2015-08-17 DIAGNOSIS — Z9049 Acquired absence of other specified parts of digestive tract: Secondary | ICD-10-CM | POA: Insufficient documentation

## 2015-08-17 DIAGNOSIS — R197 Diarrhea, unspecified: Secondary | ICD-10-CM | POA: Diagnosis not present

## 2015-08-17 DIAGNOSIS — D649 Anemia, unspecified: Secondary | ICD-10-CM | POA: Insufficient documentation

## 2015-08-17 DIAGNOSIS — F329 Major depressive disorder, single episode, unspecified: Secondary | ICD-10-CM | POA: Insufficient documentation

## 2015-08-17 DIAGNOSIS — Z87891 Personal history of nicotine dependence: Secondary | ICD-10-CM | POA: Insufficient documentation

## 2015-08-17 DIAGNOSIS — Z79899 Other long term (current) drug therapy: Secondary | ICD-10-CM | POA: Insufficient documentation

## 2015-08-17 DIAGNOSIS — K644 Residual hemorrhoidal skin tags: Secondary | ICD-10-CM | POA: Diagnosis not present

## 2015-08-17 DIAGNOSIS — H409 Unspecified glaucoma: Secondary | ICD-10-CM | POA: Diagnosis not present

## 2015-08-17 DIAGNOSIS — K625 Hemorrhage of anus and rectum: Secondary | ICD-10-CM | POA: Insufficient documentation

## 2015-08-17 DIAGNOSIS — E039 Hypothyroidism, unspecified: Secondary | ICD-10-CM | POA: Diagnosis not present

## 2015-08-17 HISTORY — DX: Type 2 diabetes mellitus without complications: E11.9

## 2015-08-17 HISTORY — DX: Malignant (primary) neoplasm, unspecified: C80.1

## 2015-08-17 HISTORY — DX: Anemia, unspecified: D64.9

## 2015-08-17 HISTORY — DX: Major depressive disorder, single episode, unspecified: F32.9

## 2015-08-17 HISTORY — DX: Unspecified glaucoma: H40.9

## 2015-08-17 HISTORY — DX: Depression, unspecified: F32.A

## 2015-08-17 HISTORY — DX: Hyperlipidemia, unspecified: E78.5

## 2015-08-17 HISTORY — DX: Hypothyroidism, unspecified: E03.9

## 2015-08-17 HISTORY — DX: Reserved for concepts with insufficient information to code with codable children: IMO0002

## 2015-08-17 HISTORY — PX: COLONOSCOPY WITH PROPOFOL: SHX5780

## 2015-08-17 LAB — GLUCOSE, CAPILLARY: GLUCOSE-CAPILLARY: 203 mg/dL — AB (ref 65–99)

## 2015-08-17 SURGERY — COLONOSCOPY WITH PROPOFOL
Anesthesia: General

## 2015-08-17 MED ORDER — PROPOFOL 10 MG/ML IV BOLUS
INTRAVENOUS | Status: DC | PRN
  Administered 2015-08-17: 50 mg via INTRAVENOUS
  Administered 2015-08-17: 80 mg via INTRAVENOUS

## 2015-08-17 MED ORDER — PROPOFOL 500 MG/50ML IV EMUL
INTRAVENOUS | Status: DC | PRN
  Administered 2015-08-17: 140 ug/kg/min via INTRAVENOUS

## 2015-08-17 MED ORDER — SODIUM CHLORIDE 0.9 % IV SOLN
INTRAVENOUS | Status: DC
  Administered 2015-08-17 (×2): via INTRAVENOUS

## 2015-08-17 NOTE — H&P (Signed)
Primary Care Physician:  [name], MD  Pre-Procedure History & Physical: HPI:  Katherine [name] is a 70 y.o. female is here for an colonoscopy.   Past Medical History  Diagnosis Date  . Hypertension   . Anemia   . Cancer ([place])   . Depression   . Hyperlipidemia   . Diabetes mellitus without complication ([place])   . Hypothyroidism   . Glaucoma   . Cystocele     Past Surgical History  Procedure Laterality Date  . Cholecystectomy      Prior to Admission medications   Medication Sig Start Date End Date Taking? Authorizing Provider  amLODipine (NORVASC) 10 MG tablet Take 10 mg by mouth at bedtime.    Yes Historical Provider, MD  cholecalciferol (VITAMIN D) 1000 units tablet Take 1,000 Units by mouth daily.   Yes Historical Provider, MD  Colesevelam HCl (WELCHOL) 3.75 g PACK Take 3.75 g by mouth 2 (two) times a week. Pt takes on Tuesday and Friday.   Yes Historical Provider, MD  glyBURIDE-metformin (GLUCOVANCE) 5-500 MG tablet Take 2 tablets by mouth 2 (two) times daily with a meal.    Yes Historical Provider, MD  levothyroxine (SYNTHROID, LEVOTHROID) 150 MCG tablet Take 150 mcg by mouth at bedtime.   Yes Historical Provider, MD  liothyronine (CYTOMEL) 50 MCG tablet Take 50 mcg by mouth at bedtime.    Yes Historical Provider, MD  lisinopril-hydrochlorothiazide (PRINZIDE,ZESTORETIC) 20-12.5 MG tablet Take 2 tablets by mouth daily.   Yes Historical Provider, MD  Multiple Vitamin (MULTIVITAMIN WITH MINERALS) TABS tablet Take 1 tablet by mouth daily.   Yes Historical Provider, MD  pioglitazone (ACTOS) 30 MG tablet Take 30 mg by mouth at bedtime.    Yes Historical Provider, MD  sitaGLIPtin (JANUVIA) 100 MG tablet Take 100 mg by mouth daily.   Yes Historical Provider, MD  ciprofloxacin (CIPRO) 500 MG tablet Take 1 tablet (500 mg total) by mouth 2 (two) times daily. 06/27/15   Lytle Butte, MD  metroNIDAZOLE (FLAGYL) 500 MG tablet Take 1 tablet (500 mg total) by mouth 3 (three) times  daily. 06/27/15   Lytle Butte, MD    Allergies as of 07/31/2015  . (No Known Allergies)    History reviewed. No pertinent family history.  Social History   Social History  . Marital Status: Married    Spouse Name: N/A  . Number of Children: N/A  . Years of Education: N/A   Occupational History  . Not on file.   Social History Main Topics  . Smoking status: Former Research scientist (life sciences)  . Smokeless tobacco: Not on file  . Alcohol Use: Yes  . Drug Use: No  . Sexual Activity: Not on file   Other Topics Concern  . Not on file   Social History Narrative     Physical Exam: BP 134/71 mmHg  Pulse 87  Temp(Src) 97.1 F (36.2 C) (Tympanic)  Resp 16  Ht 5\' 7"  (1.702 m)  Wt 108.863 kg (240 lb)  BMI 37.58 kg/m2  SpO2 100% General:   Alert,  pleasant and cooperative in NAD Head:  Normocephalic and atraumatic. Neck:  Supple; no masses or thyromegaly. Lungs:  Clear throughout to auscultation.    Heart:  Regular rate and rhythm. Abdomen:  Soft, nontender and nondistended. Normal bowel sounds, without guarding, and without rebound.   Neurologic:  Alert and  oriented x4;  grossly normal neurologically.  Impression/Plan: Katherine [name] is here for an colonoscopy to be performed for diarrhea,  rectal bleeding, abnormal CT scan  Risks, benefits, limitations, and alternatives regarding  colonoscopy have been reviewed with the patient.  Questions have been answered.  All parties agreeable.   Josefine Class, MD  08/17/2015, 8:11 AM

## 2015-08-17 NOTE — Anesthesia Preprocedure Evaluation (Signed)
Anesthesia Evaluation  Patient identified by MRN, date of birth, ID band Patient awake    Reviewed: Allergy & Precautions, NPO status , Patient's Chart, lab work & pertinent test results, reviewed documented beta blocker date and time   Airway Mallampati: II  TM Distance: >3 FB     Dental  (+) Chipped   Pulmonary former smoker,           Cardiovascular hypertension, Pt. on medications      Neuro/Psych Depression    GI/Hepatic   Endo/Other  diabetes, Type 2Hypothyroidism   Renal/GU      Musculoskeletal   Abdominal   Peds  Hematology  (+) anemia ,   Anesthesia Other Findings   Reproductive/Obstetrics                             Anesthesia Physical Anesthesia Plan  ASA: III  Anesthesia Plan: General   Post-op Pain Management:    Induction: Intravenous  Airway Management Planned: Nasal Cannula  Additional Equipment:   Intra-op Plan:   Post-operative Plan:   Informed Consent: I have reviewed the patients History and Physical, chart, labs and discussed the procedure including the risks, benefits and alternatives for the proposed anesthesia with the patient or authorized representative who has indicated his/her understanding and acceptance.     Plan Discussed with: CRNA  Anesthesia Plan Comments:         Anesthesia Quick Evaluation

## 2015-08-17 NOTE — Discharge Instructions (Signed)

## 2015-08-17 NOTE — Op Note (Signed)
[facility] Gastroenterology Patient Name: Katherine [name] Procedure Date: 08/17/2015 8:12 AM MRN: [id number] Account #: [id number] Date of Birth: 01-07-1946 Admit Type: Outpatient Age: 70 Room: [facility] ENDO ROOM 3 Gender: Female Note Status: Finalized Procedure:            Colonoscopy Indications:          Clinically significant diarrhea of unexplained origin,                        , Rectal bleeding, Abnormal CT of the GI                        tract(inflammation of desc and sigmoid) Patient Profile:      This is a 70 year old female. Providers:            Gerrit Heck. Rayann Heman, MD Referring MD:         Youlanda Roys. Lovie Macadamia, MD (Referring MD) Medicines:            Propofol per Anesthesia Complications:        No immediate complications. Procedure:            Pre-Anesthesia Assessment:                       - Prior to the procedure, a History and Physical was                        performed, and patient medications, allergies and                        sensitivities were reviewed. The patient's tolerance of                        previous anesthesia was reviewed.                       After obtaining informed consent, the colonoscope was                        passed under direct vision. Throughout the procedure,                        the patient's blood pressure, pulse, and oxygen                        saturations were monitored continuously. The                        Colonoscope was introduced through the anus and                        advanced to the the terminal ileum. The colonoscopy was                        performed without difficulty. The patient tolerated the                        procedure well. The quality of the bowel preparation                        was excellent. Findings:  The perianal exam findings include non-thrombosed external hemorrhoids.      The terminal ileum appeared normal.      Internal hemorrhoids were found during retroflexion. The  hemorrhoids       were Grade I (internal hemorrhoids that do not prolapse).      The exam was otherwise without abnormality. Impression:           - Non-thrombosed external hemorrhoids found on perianal                        exam.                       - The examined portion of the ileum was normal.                       - Internal hemorrhoids.                       - The examination was otherwise normal.                       - No specimens collected. Recommendation:       - Observe patient in GI recovery unit.                       - High fiber diet.                       - Continue present medications.                       - Repeat colonoscopy in 10 years for screening purposes.                       - Return to referring physician.                       - The findings and recommendations were discussed with                        the patient.                       - The findings and recommendations were discussed with                        the patient's family. Procedure Code(s):    --- Professional ---                       [phone], Colonoscopy, flexible; diagnostic, including                        collection of specimen(s) by brushing or washing, when                        performed (separate procedure) Diagnosis Code(s):    --- Professional ---                       K64.0, First degree hemorrhoids                       K64.4, Residual hemorrhoidal skin tags  R19.7, Diarrhea, unspecified                       K62.5, Hemorrhage of anus and rectum                       R93.3, Abnormal findings on diagnostic imaging of other                        parts of digestive tract CPT copyright 2016 American Medical Association. All rights reserved. The codes documented in this report are preliminary and upon coder review may  be revised to meet current compliance requirements. Mellody Life, MD 08/17/2015 8:40:28 AM This report has been signed  electronically. Number of Addenda: 0 Note Initiated On: 08/17/2015 8:12 AM Scope Withdrawal Time: 0 hours 12 minutes 46 seconds  Total Procedure Duration: 0 hours 19 minutes 44 seconds       [facility]

## 2015-08-17 NOTE — Transfer of Care (Signed)
Immediate Anesthesia Transfer of Care Note  Patient: Katherine [name]  Procedure(s) Performed: Procedure(s): COLONOSCOPY WITH PROPOFOL (N/A)  Patient Location: PACU  Anesthesia Type:General  Level of Consciousness: awake and alert   Airway & Oxygen Therapy: Patient Spontanous Breathing  Post-op Assessment: Report given to RN  Post vital signs: Reviewed and stable  Last Vitals:  Filed Vitals:   08/17/15 0713  BP: 134/71  Pulse: 87  Temp: 36.2 C  Resp: 16    Complications: No apparent anesthesia complications

## 2015-08-17 NOTE — Anesthesia Postprocedure Evaluation (Signed)
Anesthesia Post Note  Patient: Katherine [name]  Procedure(s) Performed: Procedure(s) (LRB): COLONOSCOPY WITH PROPOFOL (N/A)  Patient location during evaluation: Endoscopy Anesthesia Type: General Level of consciousness: awake and alert Pain management: pain level controlled Vital Signs Assessment: post-procedure vital signs reviewed and stable Respiratory status: spontaneous breathing, nonlabored ventilation, respiratory function stable and patient connected to nasal cannula oxygen Cardiovascular status: blood pressure returned to baseline and stable Postop Assessment: no signs of nausea or vomiting Anesthetic complications: no    Last Vitals:  Filed Vitals:   08/17/15 0901 08/17/15 0911  BP: 107/64 117/69  Pulse: 65 71  Temp:    Resp: 17 20    Last Pain: There were no vitals filed for this visit.               Yvonda Fouty S

## 2015-08-21 ENCOUNTER — Encounter: Payer: Self-pay | Admitting: Gastroenterology

## 2016-03-28 ENCOUNTER — Other Ambulatory Visit: Payer: Self-pay | Admitting: Family Medicine

## 2016-03-28 DIAGNOSIS — Z1231 Encounter for screening mammogram for malignant neoplasm of breast: Secondary | ICD-10-CM

## 2016-04-02 ENCOUNTER — Ambulatory Visit
Admission: RE | Admit: 2016-04-02 | Discharge: 2016-04-02 | Disposition: A | Discharge status: Home / Self Care | Payer: Medicare Other | Source: Ambulatory Visit | Attending: Family Medicine | Admitting: Family Medicine

## 2016-04-02 DIAGNOSIS — Z1231 Encounter for screening mammogram for malignant neoplasm of breast: Secondary | ICD-10-CM | POA: Insufficient documentation

## 2016-04-02 IMAGING — MG MM DIGITAL SCREENING BILAT W/ TOMO W/ CAD
8 of 13 series · 8 of 29 positions shown · non-contrast
Comparison: Previous exam(s).

CLINICAL DATA: Screening.

EXAM:
2D DIGITAL SCREENING BILATERAL MAMMOGRAM WITH CAD AND ADJUNCT TOMO

[R MLO (1 of 2)]
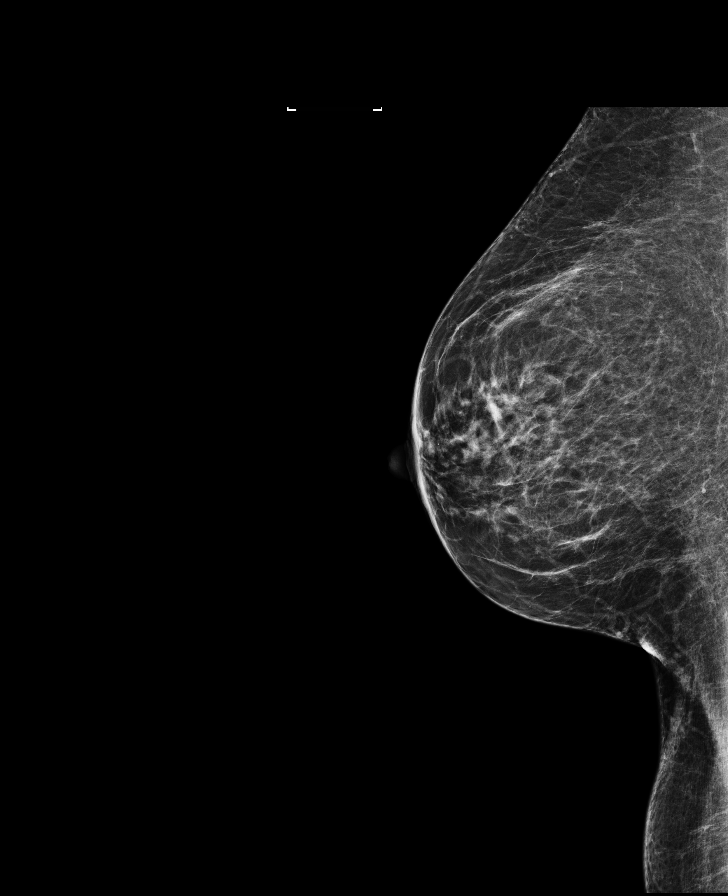

[R MLO (2 of 2)]
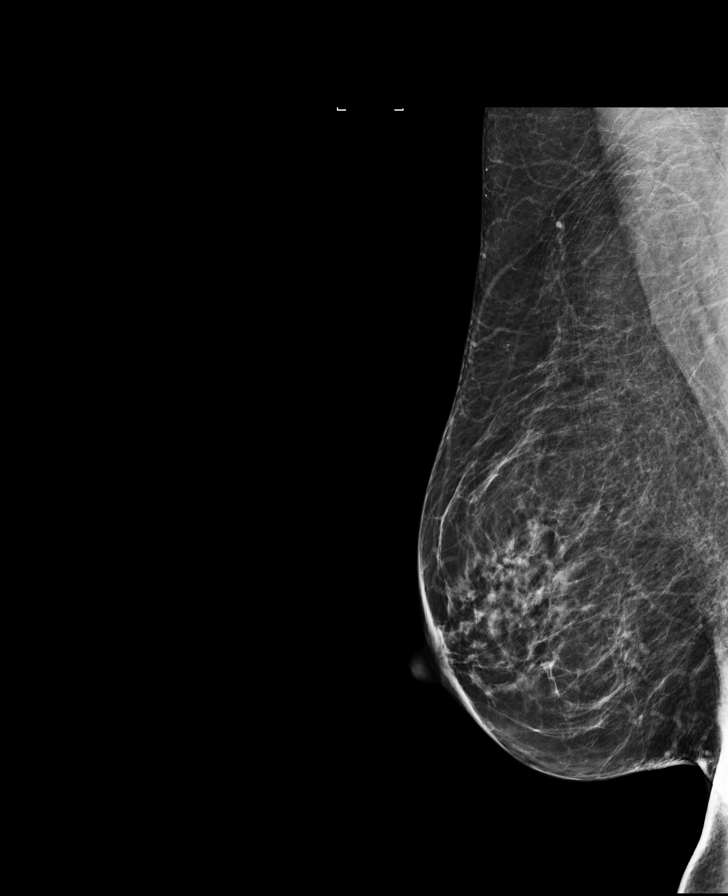

[L CC synth-2D]
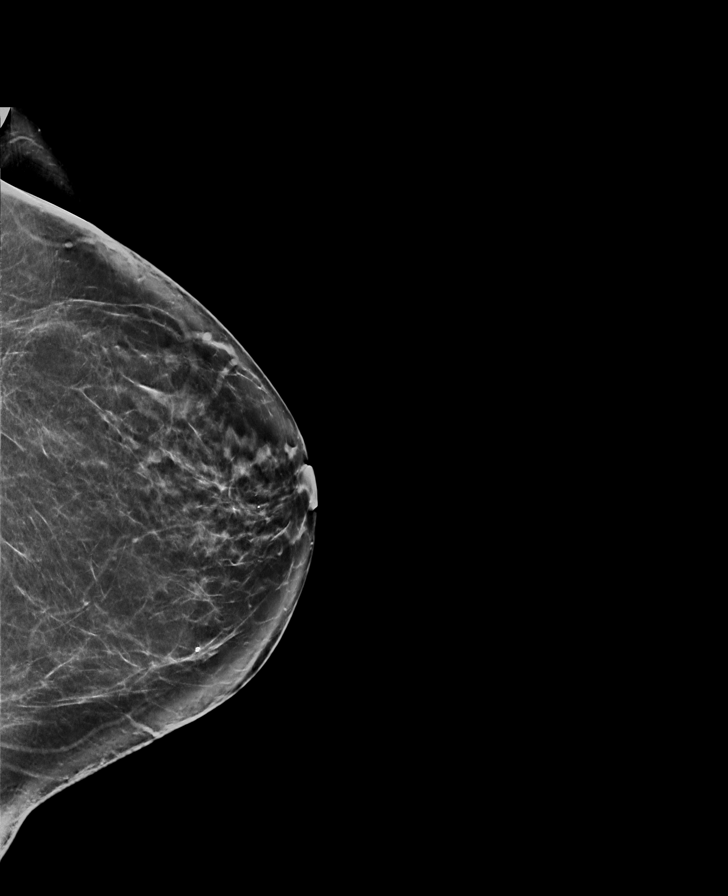

[L MLO synth-2D]
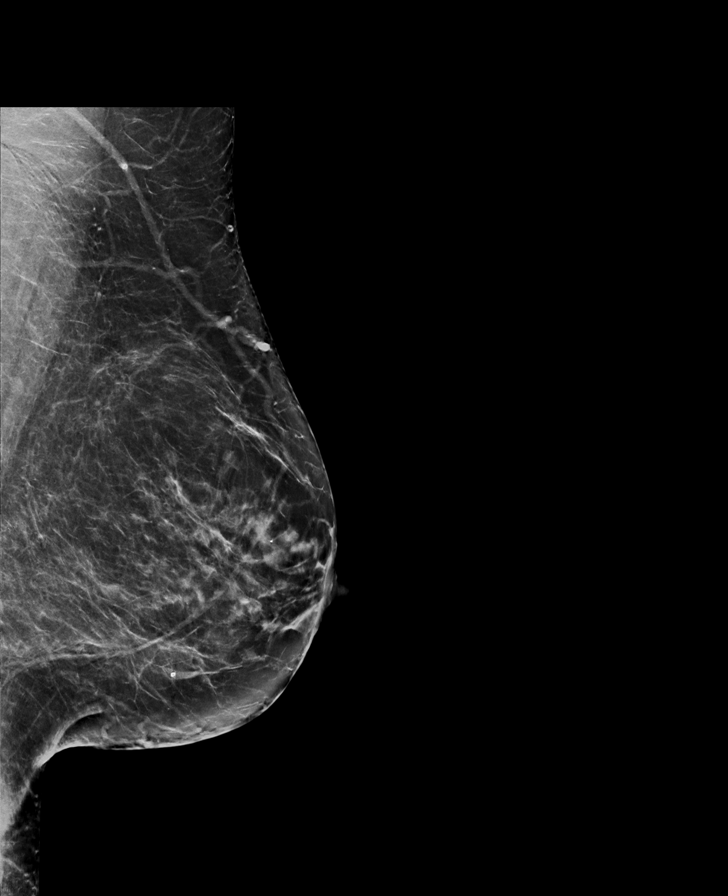

[L CC]
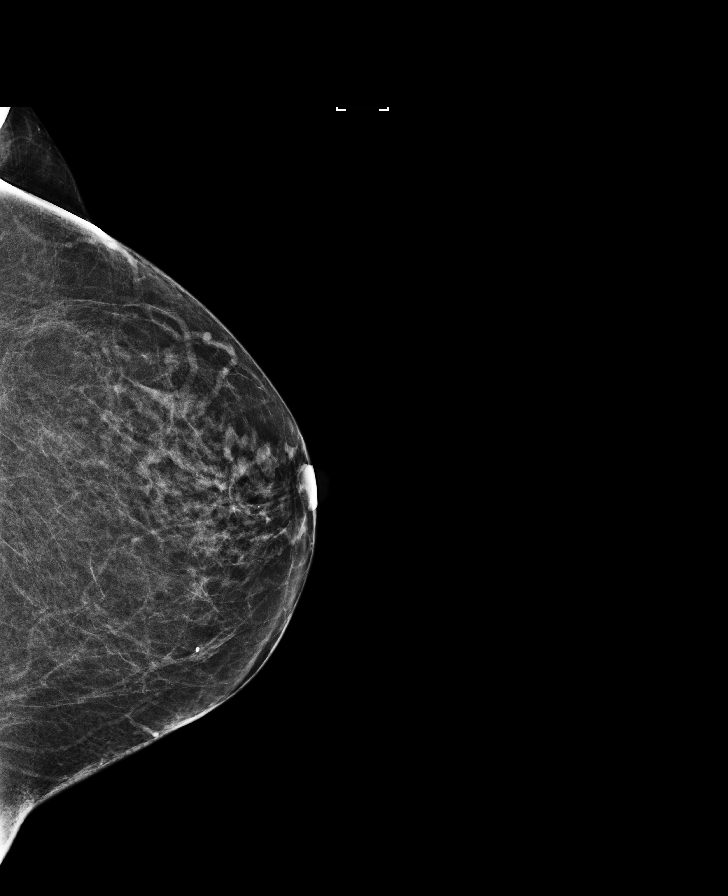

[R CC synth-2D]
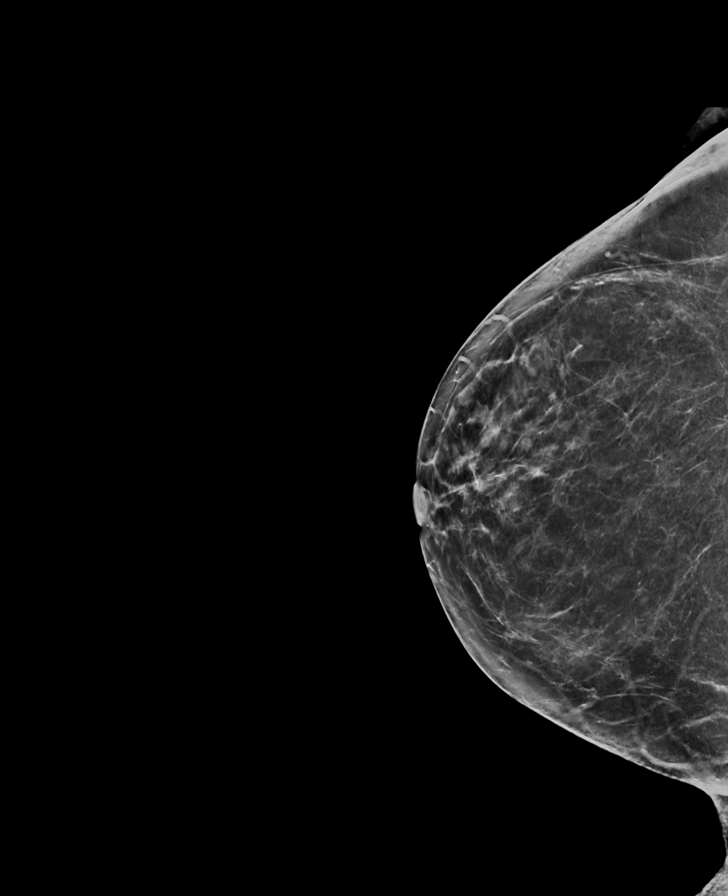

[R MLO synth-2D]
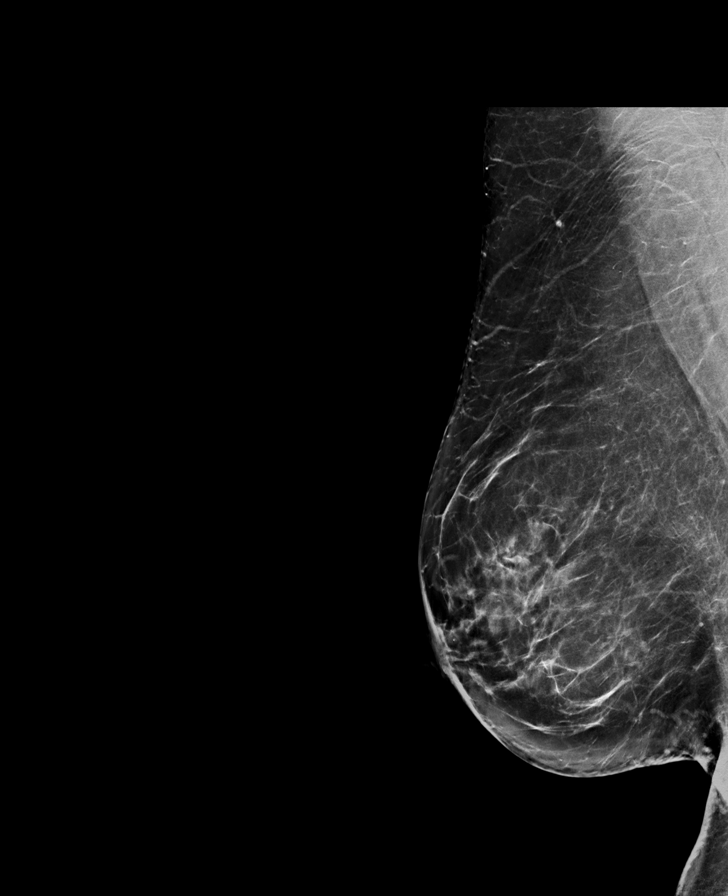

[R CC]
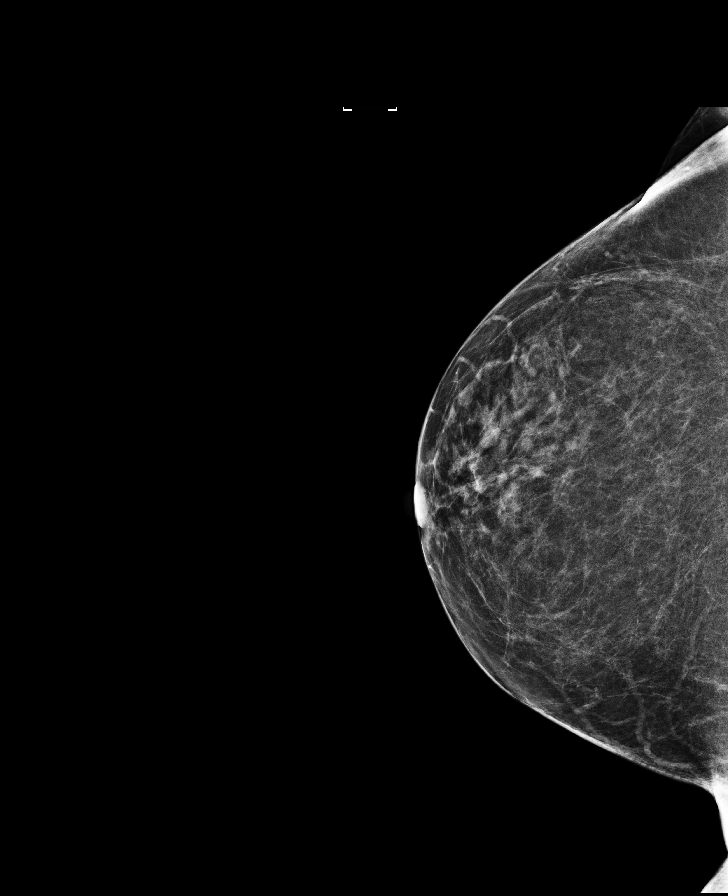

[8 of 29 positions shown; findings below may reference images not displayed]

ACR Breast Density Category b: There are scattered areas of
fibroglandular density.
FINDINGS: There are no findings suspicious for malignancy. Images were
processed with CAD.
IMPRESSION: No mammographic evidence of malignancy. A result letter of this
screening mammogram will be mailed directly to the patient.

RECOMMENDATION:
Screening mammogram in one year. (Code:[33])

BI-RADS CATEGORY  1: Negative.

## 2018-02-11 ENCOUNTER — Encounter: Payer: Medicare Other | Attending: Surgery | Admitting: *Deleted

## 2018-02-11 ENCOUNTER — Encounter: Payer: Self-pay | Admitting: *Deleted

## 2018-02-11 VITALS — BP 136/62 | Ht 66.0 in | Wt 212.0 lb

## 2018-02-11 DIAGNOSIS — E119 Type 2 diabetes mellitus without complications: Secondary | ICD-10-CM | POA: Diagnosis not present

## 2018-02-11 NOTE — Progress Notes (Signed)
Diabetes Self-Management Education  Visit Type: First/Initial  Appt. Start Time: 1505 Appt. End Time: 1615  02/11/2018  [name], identified by name and date of birth, is a 72 y.o. female with a diagnosis of Diabetes: Type 2.   ASSESSMENT  Blood pressure 136/62, height 5\' 6"  (1.676 m), weight 212 lb (96.2 kg). Body mass index is 34.22 kg/m.  Diabetes Self-Management Education - 02/11/18 1628      Visit Information   Visit Type  First/Initial      Initial Visit   Diabetes Type  Type 2    Are you currently following a meal plan?  Yes    What type of meal plan do you follow?  low carbs    Are you taking your medications as prescribed?  Yes    Date Diagnosed  20+ years ago      Health Coping   How would you rate your overall health?  Good      Psychosocial Assessment   Patient Belief/Attitude about Diabetes  Other (comment)   "angry"   Self-care barriers  None    Self-management support  Doctor's office;Family;Friends    Patient Concerns  Nutrition/Meal planning;Monitoring;Weight Control;Healthy Lifestyle    Special Needs  None    Preferred Learning Style  Visual;Auditory    Learning Readiness  Change in progress    How often do you need to have someone help you when you read instructions, pamphlets, or other written materials from your doctor or pharmacy?  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  Needs Review    Patient understands incorporating nutritional management into lifestyle.  Needs Review    Patient undertands incorporating physical activity into lifestyle.  Demonstrates understanding / competency    Patient understands using medications safely.  Needs Review    Patient understands monitoring blood glucose, interpreting and using results  Needs Review    Patient understands prevention, detection, and treatment of acute  complications.  Needs Review    Patient understands prevention, detection, and treatment of chronic complications.  Needs Review    Patient understands how to develop strategies to address psychosocial issues.  Needs Review    Patient understands how to develop strategies to promote health/change behavior.  Needs Review      Complications   Last HgB A1C per patient/outside source  7.1 %   11/26/17   How often do you check your blood sugar?  1-2 times/day    Fasting Blood glucose range (mg/dL)  70-129   Pt reports FBG's 100-120's mg/dL.    Number of hypoglycemic episodes per month  1    Can you tell when your blood sugar is low?  Yes   Pt reports she did some extra exercise.    What do you do if your blood sugar is low?  ate some peanut butter crackers    Have you had a dilated eye exam in the past 12 months?  Yes    Have you had a dental exam in the past 12 months?  Yes    Are you checking your feet?  Yes    How many days per week are you checking your feet?  7      Dietary Intake   Breakfast  1/2 peanut butter sandwich, cereal and milk    Lunch  skips or has  a Glucerna drink    Dinner  chicken, pork, occasional beef or fish; beans, occasional pasta, peas, rice and bread; non-starchy vegetables    Beverage(s)  water, unsweetened tea, black coffee      Exercise   Exercise Type  Light (walking / raking leaves)    How many days per week to you exercise?  5    How many minutes per day do you exercise?  50    Total minutes per week of exercise  250      Patient Education   Previous Diabetes Education  No    Disease state   Definition of diabetes, type 1 and 2, and the diagnosis of diabetes    Nutrition management   Role of diet in the treatment of diabetes and the relationship between the three main macronutrients and blood glucose level;Reviewed blood glucose goals for pre and post meals and how to evaluate the patients' food intake on their blood glucose level.;Carbohydrate counting     Physical activity and exercise   Role of exercise on diabetes management, blood pressure control and cardiac health.    Medications  Reviewed patients medication for diabetes, action, purpose, timing of dose and side effects.    Monitoring  Taught/discussed recording of test results and interpretation of SMBG.;Identified appropriate SMBG and/or A1C goals.    Acute complications  Taught treatment of hypoglycemia - the 15 rule.    Chronic complications  Relationship between chronic complications and blood glucose control    Psychosocial adjustment  Identified and addressed patients feelings and concerns about diabetes      Individualized Goals (developed by patient)   Reducing Risk Prevent diabetes complications Lose weight Lead a healthier lifestyle Become more fit     Outcomes   Expected Outcomes  Demonstrated interest in learning. Expect positive outcomes    Future DMSE  2 wks       Individualized Plan for Diabetes Self-Management Training:   Learning Objective:  Patient will have a greater understanding of diabetes self-management. Patient education plan is to attend individual and/or group sessions per assessed needs and concerns.   Plan:   Patient Instructions  Check blood sugars 1 x day before breakfast or 2 hrs after one meal every day Bring blood sugar records to the next class Exercise: Continue program  for 50 minutes   5 days a week Eat 3 meals day,   1 snack a day Space meals 4-6 hours apart Don't skip meals Avoid sugar sweetened drinks (juices) unless treating a low blood sugar Carry fast acting glucose and a snack at all times  Expected Outcomes:  Demonstrated interest in learning. Expect positive outcomes  Education material provided:  General Meal Planning Guidelines Simple Meal Plan Glucose tablets Symptoms, causes and treatments of Hypoglycemia  If problems or questions, patient to contact team via:  Johny Drilling, [place], [place], CDE [phone]  Future DSME appointment: 2 wks  February 25, 2018 for Diabetes Class 1

## 2018-02-11 NOTE — Patient Instructions (Signed)
Check blood sugars 1 x day before breakfast or 2 hrs after one meal every day Bring blood sugar records to the next class  Exercise: Continue program  for 50 minutes   5 days a week  Eat 3 meals day,   1 snack a day Space meals 4-6 hours apart Don't skip meals Avoid sugar sweetened drinks (juices) unless treating a low blood sugar  Carry fast acting glucose and a snack at all times  Return for classes on:

## 2018-02-25 ENCOUNTER — Encounter: Payer: Medicare Other | Admitting: Dietician

## 2018-02-25 ENCOUNTER — Encounter: Payer: Self-pay | Admitting: Dietician

## 2018-02-25 VITALS — Ht 66.0 in | Wt 214.2 lb

## 2018-02-25 DIAGNOSIS — E119 Type 2 diabetes mellitus without complications: Secondary | ICD-10-CM

## 2018-02-25 NOTE — Progress Notes (Signed)

## 2018-03-04 ENCOUNTER — Ambulatory Visit: Payer: BC Managed Care – PPO

## 2018-03-11 ENCOUNTER — Encounter: Payer: Medicare Other | Admitting: Dietician

## 2018-03-11 ENCOUNTER — Encounter: Payer: Self-pay | Admitting: Dietician

## 2018-03-11 VITALS — BP 128/76 | Ht 66.0 in | Wt 219.4 lb

## 2018-03-11 DIAGNOSIS — E119 Type 2 diabetes mellitus without complications: Secondary | ICD-10-CM

## 2018-03-11 NOTE — Progress Notes (Signed)

## 2018-03-25 ENCOUNTER — Encounter: Payer: Self-pay | Admitting: *Deleted

## 2018-03-25 ENCOUNTER — Encounter: Payer: Medicare Other | Attending: Surgery | Admitting: *Deleted

## 2018-03-25 VITALS — Wt 216.5 lb

## 2018-03-25 DIAGNOSIS — E119 Type 2 diabetes mellitus without complications: Secondary | ICD-10-CM | POA: Insufficient documentation

## 2018-03-25 NOTE — Progress Notes (Signed)
Appt. Start Time: 0900 Appt. End Time: 1130  Class 2 Nutritional Management - identify sources of carbohydrate, protein and fat; plan balanced meals; estimate servings of carbohydrates in meals  Psychosocial - identify DM as a source of stress; state the effects of stress on BG control  Exercise - describe the effects of exercise on blood glucose and importance of regular exercise in controlling diabetes; state a plan for personal exercise; verbalize contraindications for exercise  Self-Monitoring - state importance of SMBG; use SMBG results to effectively manage diabetes; identify importance of regular HbA1C testing and goals for results  Acute Complications - recognize hyperglycemia and hypoglycemia with causes and effects; identify blood glucose results as high, low or in control; list steps in treating and preventing high and low blood glucose  Sick Day Guidelines: state appropriate measure to manage blood glucose when ill (need for meds, HBGM plan, when to call physician, need for fluids)  Chronic Complications/Foot, Skin, Eye Dental Care - identify possible long-term complications of diabetes (retinopathy, neuropathy, nephropathy, cardiovascular disease, infections); explain steps in prevention and treatment of chronic complications; state importance of daily self-foot exams; describe how to examine feet and what to look for; explain appropriate eye and dental care  Lifestyle Changes/Goals - state benefits of making appropriate lifestyle changes; identify habits that need to change (meals, tobacco, alcohol); identify strategies to reduce risk factors for personal health  Pregnancy/Sexual Health - state importance of good blood glucose control in preventing sexual problems (impotence, vaginal dryness, infections, loss of desire)  Teaching Materials Used: Class 2 Slide Packet A1C Pamphlet Foot Care Literature Kidney Test Handout Stroke Card Quick and "Balanced" Meal Ideas Carb  Counting and Meal Planning Book Goals for Class 2  

## 2018-04-01 ENCOUNTER — Encounter: Payer: Self-pay | Admitting: *Deleted

## 2018-05-18 ENCOUNTER — Other Ambulatory Visit: Payer: Self-pay | Admitting: Family Medicine

## 2018-05-18 DIAGNOSIS — Z1231 Encounter for screening mammogram for malignant neoplasm of breast: Secondary | ICD-10-CM

## 2018-06-24 ENCOUNTER — Ambulatory Visit
Admission: RE | Admit: 2018-06-24 | Discharge: 2018-06-24 | Disposition: A | Discharge status: Home / Self Care | Payer: Medicare Other | Source: Ambulatory Visit | Attending: Family Medicine | Admitting: Family Medicine

## 2018-06-24 DIAGNOSIS — Z1231 Encounter for screening mammogram for malignant neoplasm of breast: Secondary | ICD-10-CM | POA: Diagnosis not present

## 2018-06-24 IMAGING — MG DIGITAL SCREENING BILATERAL MAMMOGRAM WITH TOMO AND CAD
8 series · 8 of 24 positions shown · non-contrast
Comparison: Previous exam(s).

CLINICAL DATA: Screening.

EXAM:
DIGITAL SCREENING BILATERAL MAMMOGRAM WITH TOMO AND CAD

[R MLO synth-2D]
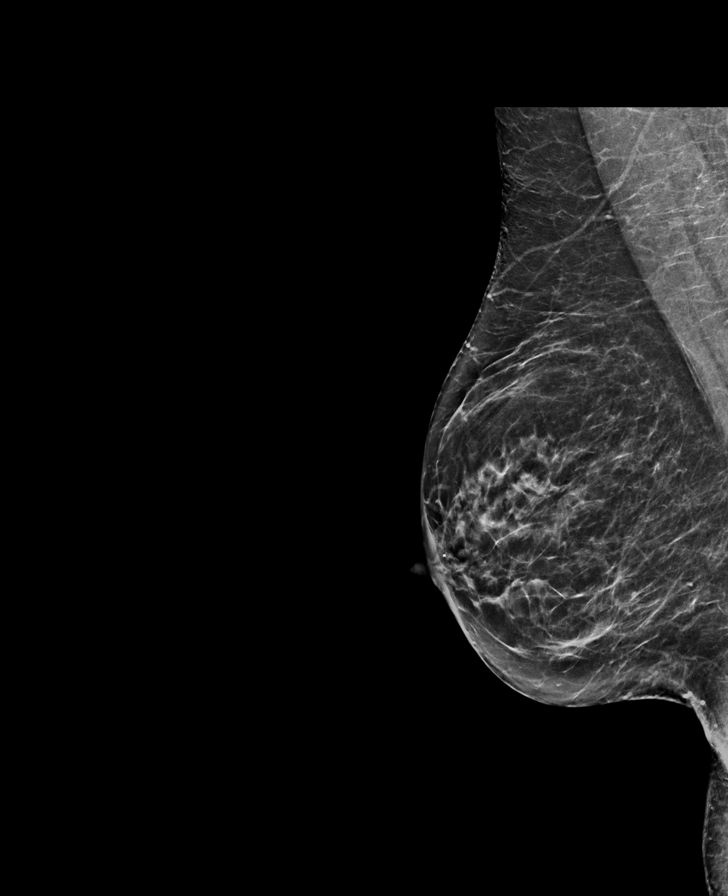

[R CC synth-2D]
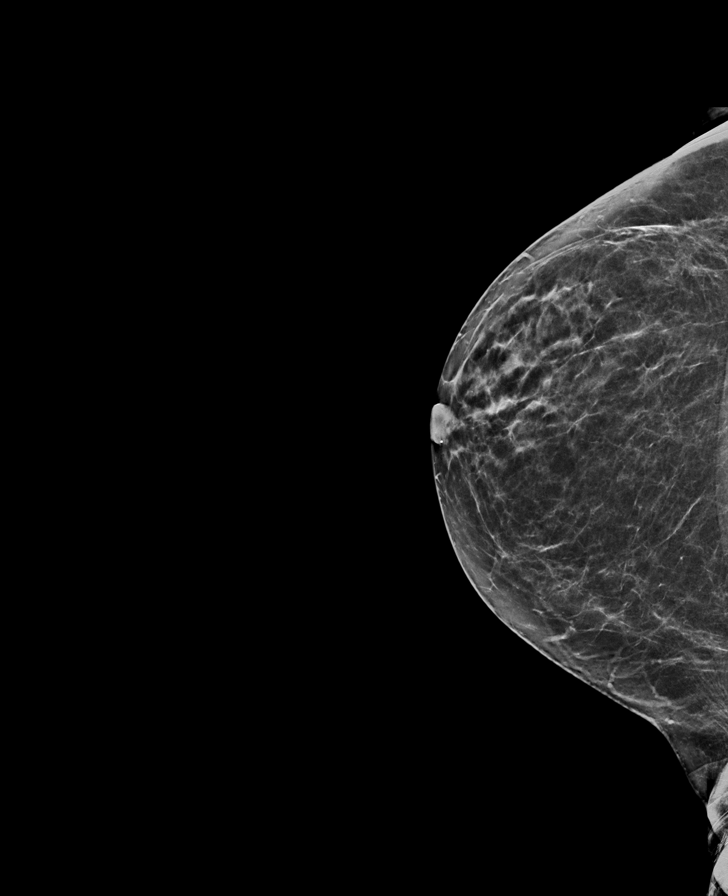

[L CC synth-2D]
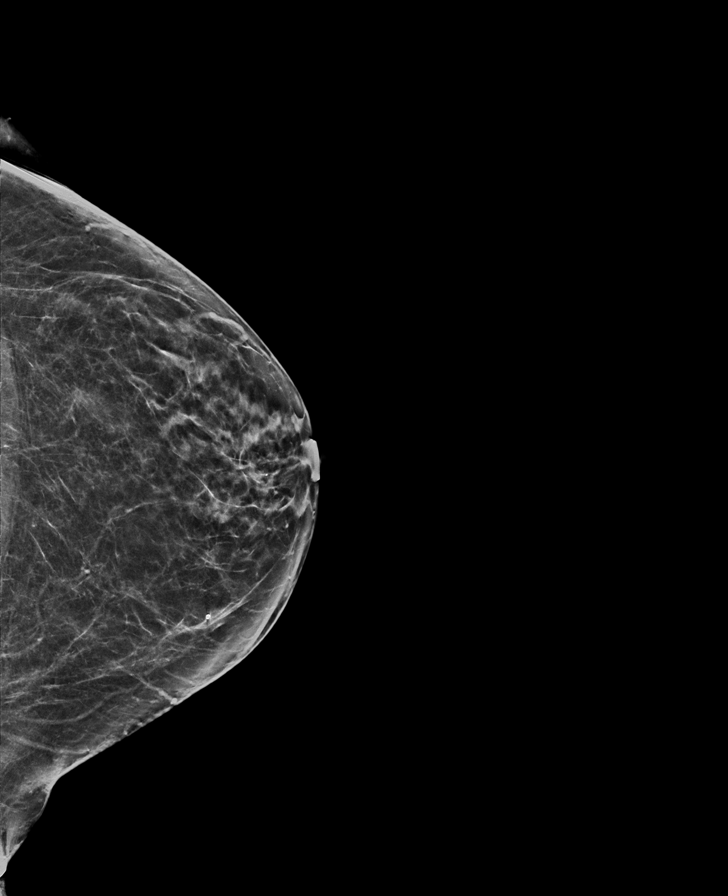

[L MLO synth-2D]
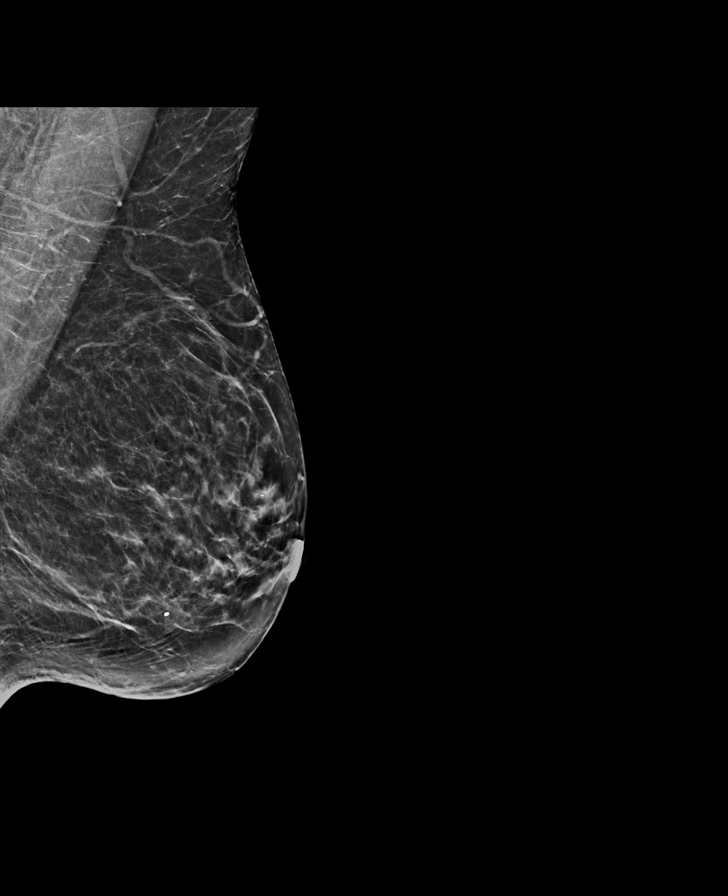

[R CC tomo · tomo slice 33/64.0]
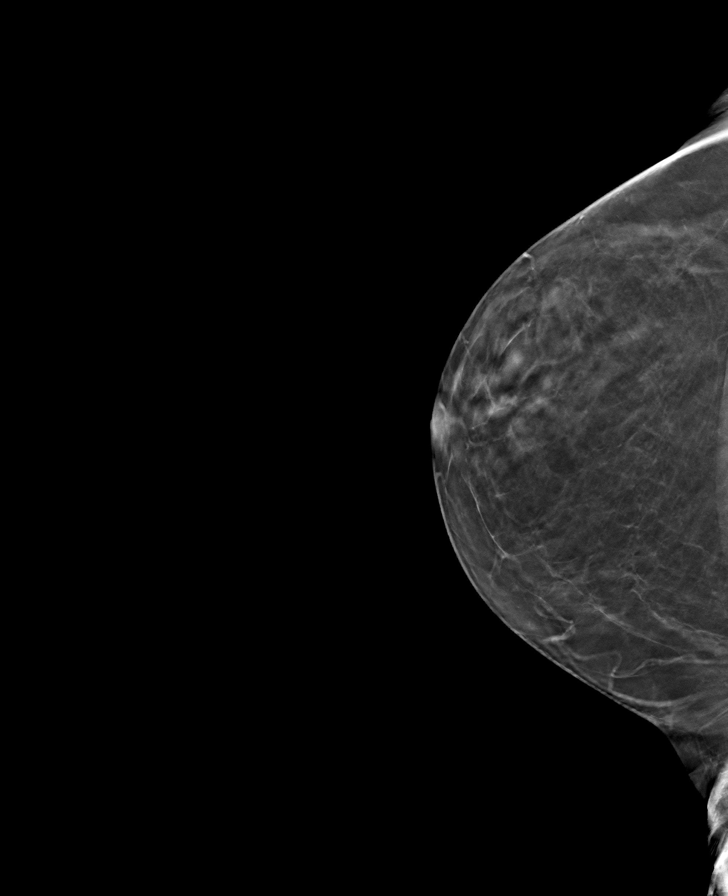

[R MLO tomo · tomo slice 33/66.0]
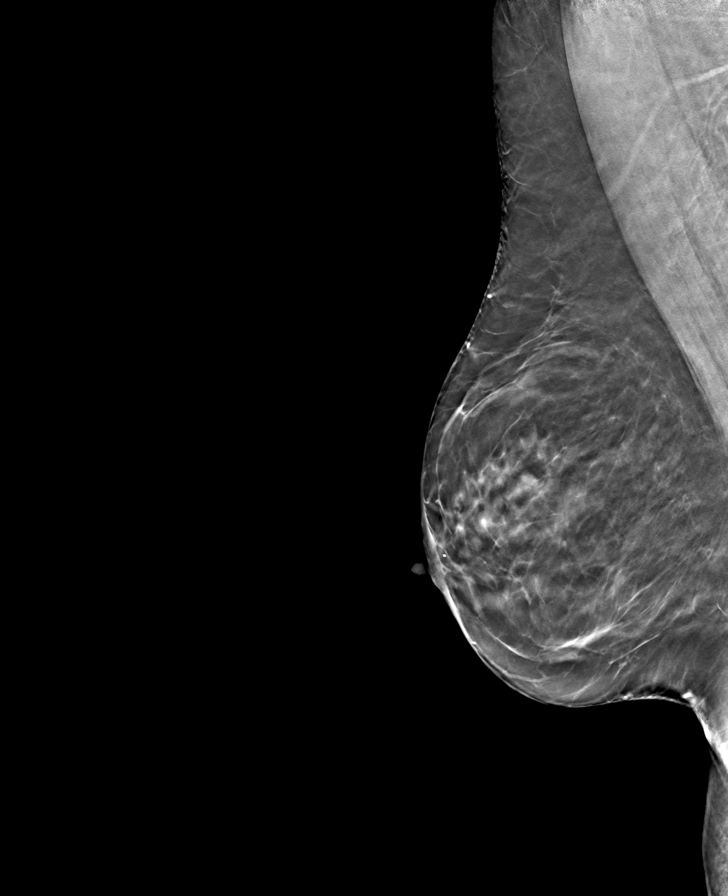

[L CC tomo · tomo slice 33/65.0]
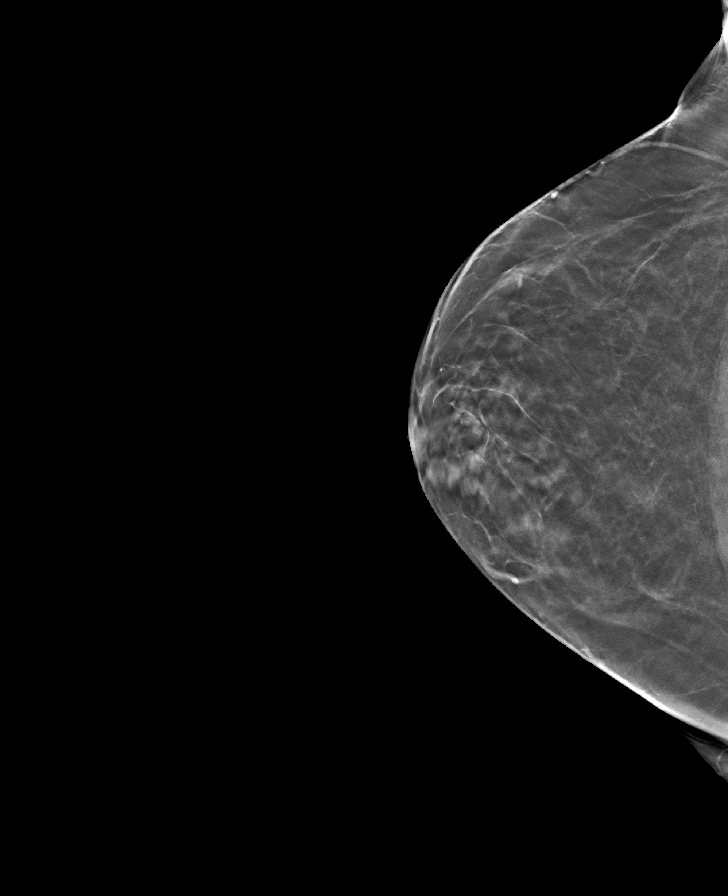

[L MLO tomo · tomo slice 34/67.0]
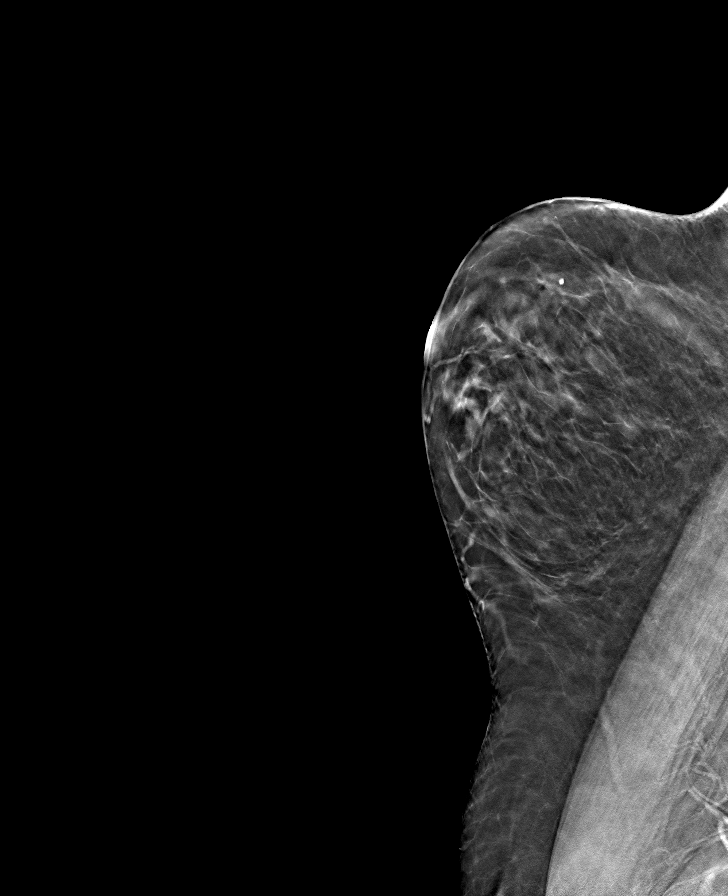

[8 of 24 positions shown; findings below may reference images not displayed]

ACR Breast Density Category b: There are scattered areas of
fibroglandular density.
FINDINGS: There are no findings suspicious for malignancy. Images were
processed with CAD.
IMPRESSION: No mammographic evidence of malignancy. A result letter of this
screening mammogram will be mailed directly to the patient.

RECOMMENDATION:
Screening mammogram in one year. (Code:[TQ])

BI-RADS CATEGORY  1: Negative.

## 2019-02-22 ENCOUNTER — Emergency Department
Admission: EM | Admit: 2019-02-22 | Discharge: 2019-02-23 | Disposition: A | Discharge status: Home / Self Care | Payer: Medicare Other | Source: Home / Self Care | Attending: Emergency Medicine | Admitting: Emergency Medicine

## 2019-02-22 ENCOUNTER — Other Ambulatory Visit: Payer: Self-pay

## 2019-02-22 DIAGNOSIS — K922 Gastrointestinal hemorrhage, unspecified: Secondary | ICD-10-CM | POA: Diagnosis not present

## 2019-02-22 DIAGNOSIS — I10 Essential (primary) hypertension: Secondary | ICD-10-CM | POA: Insufficient documentation

## 2019-02-22 DIAGNOSIS — Z7901 Long term (current) use of anticoagulants: Secondary | ICD-10-CM | POA: Insufficient documentation

## 2019-02-22 DIAGNOSIS — Z79899 Other long term (current) drug therapy: Secondary | ICD-10-CM | POA: Insufficient documentation

## 2019-02-22 DIAGNOSIS — E039 Hypothyroidism, unspecified: Secondary | ICD-10-CM | POA: Insufficient documentation

## 2019-02-22 DIAGNOSIS — E119 Type 2 diabetes mellitus without complications: Secondary | ICD-10-CM | POA: Insufficient documentation

## 2019-02-22 DIAGNOSIS — K254 Chronic or unspecified gastric ulcer with hemorrhage: Secondary | ICD-10-CM | POA: Diagnosis not present

## 2019-02-22 DIAGNOSIS — R42 Dizziness and giddiness: Secondary | ICD-10-CM

## 2019-02-22 DIAGNOSIS — R531 Weakness: Secondary | ICD-10-CM | POA: Insufficient documentation

## 2019-02-22 DIAGNOSIS — Z7984 Long term (current) use of oral hypoglycemic drugs: Secondary | ICD-10-CM | POA: Insufficient documentation

## 2019-02-22 DIAGNOSIS — Z87891 Personal history of nicotine dependence: Secondary | ICD-10-CM | POA: Insufficient documentation

## 2019-02-22 LAB — BASIC METABOLIC PANEL
Anion gap: 14 (ref 5–15)
BUN: 47 mg/dL — ABNORMAL HIGH (ref 8–23)
CO2: 22 mmol/L (ref 22–32)
Calcium: 9.2 mg/dL (ref 8.9–10.3)
Chloride: 102 mmol/L (ref 98–111)
Creatinine, Ser: 1.29 mg/dL — ABNORMAL HIGH (ref 0.44–1.00)
GFR calc Af Amer: 48 mL/min — ABNORMAL LOW (ref >60)
GFR calc non Af Amer: 42 mL/min — ABNORMAL LOW (ref >60)
Glucose, Bld: 241 mg/dL — ABNORMAL HIGH (ref 70–99)
Potassium: 4.5 mmol/L (ref 3.5–5.1)
Sodium: 138 mmol/L (ref 135–145)

## 2019-02-22 LAB — CBC
HCT: 35.9 % — ABNORMAL LOW (ref 36.0–46.0)
Hemoglobin: 11.6 g/dL — ABNORMAL LOW (ref 12.0–15.0)
MCH: 30.4 pg (ref 26.0–34.0)
MCHC: 32.3 g/dL (ref 30.0–36.0)
MCV: 94 fL (ref 80.0–100.0)
Platelets: 97 10*3/uL — ABNORMAL LOW (ref 150–400)
RBC: 3.82 MIL/uL — ABNORMAL LOW (ref 3.87–5.11)
RDW: 15.8 % — ABNORMAL HIGH (ref 11.5–15.5)
WBC: 4.7 10*3/uL (ref 4.0–10.5)
nRBC: 0 % (ref 0.0–0.2)

## 2019-02-22 LAB — TROPONIN I (HIGH SENSITIVITY)
Troponin I (High Sensitivity): 6 ng/L (ref <18)
Troponin I (High Sensitivity): 7 ng/L (ref <18)

## 2019-02-22 LAB — TSH: TSH: 0.015 u[IU]/mL — ABNORMAL LOW (ref 0.350–4.500)

## 2019-02-22 LAB — T4, FREE: Free T4: 0.77 ng/dL (ref 0.61–1.12)

## 2019-02-22 MED ORDER — SODIUM CHLORIDE 0.9 % IV BOLUS
500.0000 mL | Freq: Once | INTRAVENOUS | Status: AC
  Administered 2019-02-22: 500 mL via INTRAVENOUS

## 2019-02-22 MED ORDER — SODIUM CHLORIDE 0.9 % IV BOLUS
500.0000 mL | Freq: Once | INTRAVENOUS | Status: AC
  Administered 2019-02-22: 19:00:00 500 mL via INTRAVENOUS

## 2019-02-22 NOTE — Discharge Instructions (Addendum)
Please seek medical attention for any high fevers, chest pain, shortness of breath, change in behavior, persistent vomiting, bloody stool or any other new or concerning symptoms.  

## 2019-02-22 NOTE — ED Triage Notes (Addendum)
73 yo  [facility] from restaurant near-syncopal episode diabetic, CBG 198 did not completely black out, did not fall (sitting) Diaphoretic, pale, EMS had hard time getting BP, initial 84/48 history of afib no headache, no chest pain, no N/V 127/64 last bp left forearm 20g IV xarelto history of hypotheroidism, HTN husband following

## 2019-02-22 NOTE — ED Provider Notes (Signed)
[facility] Emergency Department Provider Note  ____________________________________________  Time seen: Approximately 7:34 PM  I have reviewed the triage vital signs and the nursing notes.   HISTORY  Chief Complaint Near Syncope    HPI Katherine [name] is a 73 y.o. female with a history of anemia diabetes hypertension hyperlipidemia who comes to the ED due to lightheadedness.  She reports being in her usual state of health, went out to dinner at a restaurant, and when she got up to walk to the cash register after eating, she got lightheaded, felt flushed with darkened vision, felt like she was going to pass out.  She reports the feeling lasted about 20 seconds until she sat down after which she felt better.  After a few minutes, she stood up again to leave and again got lightheaded.  No syncope or falls or head trauma or other trauma.  Denies fever vomiting chest pain shortness of breath headache numbness tingling weakness or focal vision changes.   Currently feeling back to normal.  Reports eating and drinking normally, mostly drinks tea, no diarrhea or vomiting.     Past Medical History:  Diagnosis Date  . Anemia   . Cancer ([place])   . Cystocele   . Depression   . Diabetes mellitus without complication ([place])   . Glaucoma   . Hyperlipidemia   . Hypertension   . Hypothyroidism      Patient Active Problem List   Diagnosis Date Noted  . Lower GI bleed 06/25/2015     Past Surgical History:  Procedure Laterality Date  . BREAST EXCISIONAL BIOPSY Left 07/2012  . CHOLECYSTECTOMY    . COLONOSCOPY WITH PROPOFOL N/A 08/17/2015   Procedure: COLONOSCOPY WITH PROPOFOL;  Surgeon: Josefine Class, MD;  Location: [facility] ENDOSCOPY;  Service: Endoscopy;  Laterality: N/A;     Prior to Admission medications   Medication Sig Start Date End Date Taking? Authorizing Provider  amLODipine (NORVASC) 10 MG tablet Take 10 mg by mouth at bedtime.     [provider]  cholecalciferol (VITAMIN D) 1000 units tablet Take 1,000 Units by mouth daily.    [provider]  diazepam (VALIUM) 5 MG tablet 1/2-1 tab q 8 hr prn 01/06/18   [provider]  furosemide (LASIX) 20 MG tablet Take 20 mg once daily, with an additional 20 mg as needed for swelling. 02/08/18   [provider]  glimepiride (AMARYL) 2 MG tablet Take 2 mg by mouth daily with breakfast.  09/03/17 09/03/18  [provider]  liothyronine (CYTOMEL) 25 MCG tablet TAKE 1 TABLET (25 MCG TOTAL) BY MOUTH 2 (TWO) TIMES DAILY 02/18/18   [provider]  lisinopril (PRINIVIL,ZESTRIL) 20 MG tablet Take 20 mg by mouth daily.  06/10/17 06/10/18  [provider]  metFORMIN (GLUCOPHAGE) 1000 MG tablet TAKE 1 TABLET BY MOUTH TWICE A DAY WITH MEALS 10/06/17   [provider]  metoprolol succinate (TOPROL-XL) 25 MG 24 hr tablet Take 25 mg by mouth daily.  10/21/17 10/21/18  [provider]  Multiple Vitamin (MULTI-VITAMINS) TABS Take 1 tablet by mouth daily.     [provider]  ONE TOUCH ULTRA TEST test strip  03/11/18   [provider]  pioglitazone (ACTOS) 30 MG tablet Take 30 mg by mouth at bedtime.     [provider]  rivaroxaban (XARELTO) 20 MG TABS tablet Take 20 mg by mouth daily with supper.  03/20/15   [provider]  sitaGLIPtin (  JANUVIA) 100 MG tablet Take 100 mg by mouth daily.    [provider]  SYNTHROID 100 MCG tablet Take 100 mcg by mouth daily. 03/11/18   [provider]     Allergies Codeine, Lipitor [atorvastatin], Morphine and related, Pravastatin, and Vytorin [ezetimibe-simvastatin]   Family History  Problem Relation Age of Onset  . Diabetes Mother   . Diabetes Father   . Diabetes Brother     Social History Social History   Tobacco Use  . Smoking status: Former Smoker    Packs/day: 1.00    Years: 8.00    Pack years: 8.00    Types: Cigarettes    Quit  date: 05/12/1976    Years since quitting: 42.8  . Smokeless tobacco: Never Used  Substance Use Topics  . Alcohol use: Yes    Alcohol/week: 0.0 - 2.0 standard drinks    Comment: occasional, not every week  . Drug use: No    Review of Systems  Constitutional:   No fever or chills.  ENT:   No sore throat. No rhinorrhea. Cardiovascular:   No chest pain or syncope. Respiratory:   No dyspnea or cough. Gastrointestinal:   Negative for abdominal pain, vomiting and diarrhea.  Musculoskeletal:   Negative for focal pain or swelling All other systems reviewed and are negative except as documented above in ROS and HPI.  ____________________________________________   PHYSICAL EXAM:  VITAL SIGNS: ED Triage Vitals  Enc Vitals Group     BP 02/22/19 1835 (!) 116/53     Pulse Rate 02/22/19 1823 75     Resp 02/22/19 1838 20     Temp 02/22/19 1823 98.2 F (36.8 C)     Temp Source 02/22/19 1823 Oral     SpO2 02/22/19 1823 100 %     Weight 02/22/19 1824 220 lb (99.8 kg)     Height 02/22/19 1824 5\' 7"  (1.702 m)     Head Circumference --      Peak Flow --      Pain Score 02/22/19 1824 0     Pain Loc --      Pain Edu? --      Excl. in [place]? --     Vital signs reviewed, nursing assessments reviewed.   Constitutional:   Alert and oriented. Non-toxic appearance. Eyes:   Conjunctivae are normal. EOMI. PERRL. ENT      Head:   Normocephalic and atraumatic.      Nose:   Wearing a mask.      Mouth/Throat:   Wearing a mask.      Neck:   No meningismus. Full ROM. Hematological/Lymphatic/Immunilogical:   No cervical lymphadenopathy. Cardiovascular:   RRR. Symmetric bilateral radial and DP pulses.  No murmurs. Cap refill less than 2 seconds. Respiratory:   Normal respiratory effort without tachypnea/retractions. Breath sounds are clear and equal bilaterally. No wheezes/rales/rhonchi. Gastrointestinal:   Soft and nontender. Non distended. There is no CVA tenderness.  No rebound, rigidity, or  guarding. Genitourinary:   deferred Musculoskeletal:   Normal range of motion in all extremities. No joint effusions.  No lower extremity tenderness.  No edema. Neurologic:   Normal speech and language.  Cranial nerves II through XII intact Motor grossly intact. No acute focal neurologic deficits are appreciated.  Skin:    Skin is warm, dry and intact. No rash noted.  No petechiae, purpura, or bullae.  ____________________________________________    LABS (pertinent positives/negatives) (all labs ordered are listed, but only abnormal results are  displayed) Labs Reviewed  BASIC METABOLIC PANEL - Abnormal; Notable for the following components:      Result Value   Glucose, Bld 241 (*)    BUN 47 (*)    Creatinine, Ser 1.29 (*)    GFR calc non Af Amer 42 (*)    GFR calc Af Amer 48 (*)    All other components within normal limits  CBC - Abnormal; Notable for the following components:   RBC 3.82 (*)    Hemoglobin 11.6 (*)    HCT 35.9 (*)    RDW 15.8 (*)    Platelets 97 (*)    All other components within normal limits  TSH - Abnormal; Notable for the following components:   TSH 0.015 (*)    All other components within normal limits  T4, FREE  URINALYSIS, COMPLETE (UACMP) WITH MICROSCOPIC  CBG MONITORING, ED  TROPONIN I (HIGH SENSITIVITY)  TROPONIN I (HIGH SENSITIVITY)   ____________________________________________   EKG  Interpreted by me Sinus rhythm rate of 76, normal axis and intervals.  Left bundle branch block.  No acute ischemic changes.  ____________________________________________    RADIOLOGY  No results found.  ____________________________________________   PROCEDURES Procedures  ____________________________________________  DIFFERENTIAL DIAGNOSIS   Dehydration, electrolyte abnormality, vagal episode.  CLINICAL IMPRESSION / ASSESSMENT AND PLAN / ED COURSE  Medications ordered in the ED: Medications  sodium chloride 0.9 % bolus 500 mL (0 mLs  Intravenous Stopped 02/22/19 2033)  sodium chloride 0.9 % bolus 500 mL (500 mLs Intravenous New Bag/Given 02/22/19 2226)    Pertinent labs & imaging results that were available during my care of the patient were reviewed by me and considered in my medical decision making (see chart for details).  Katherine [name] was evaluated in Emergency Department on 02/22/2019 for the symptoms described in the history of present illness. She was evaluated in the context of the global COVID-19 pandemic, which necessitated consideration that the patient might be at risk for infection with the SARS-CoV-2 virus that causes COVID-19. Institutional protocols and algorithms that pertain to the evaluation of patients at risk for COVID-19 are in a state of rapid change based on information released by regulatory bodies including the CDC and federal and state organizations. These policies and algorithms were followed during the patient's care in the ED.   Patient presents with 2 episodes of lightheadedness which are orthostatic in nature.  No focal pain syndrome.  Doubt ACS dissection PE cerebral aneurysm stroke intracranial hemorrhage or AAA, but with her age and comorbidities, I will check labs and serial troponins for risk stratification.  Orthostatics are positive with significant heart rate elevation on standing.  I will order IV fluids for further hydration.  ----------------------------------------- 10:57 PM on 02/22/2019 -----------------------------------------  Serial troponins negative.  Vital signs remain normal.  Awaiting urinalysis and repeat orthostatic vital signs after IV fluids.  If symptomatically not improved patient may need observation prior to discharge home.   ----------------------------------------- 11:06 PM on 02/22/2019 -----------------------------------------   Care signed out to Dr. Archie Balboa to follow-up on UA and orthostatic  improvement  ____________________________________________   FINAL CLINICAL IMPRESSION(S) / ED DIAGNOSES    Final diagnoses:  Orthostatic dizziness  Generalized weakness     ED Discharge Orders    None      Portions of this note were generated with dragon dictation software. Dictation errors may occur despite best attempts at proofreading.   Carrie Mew, MD 02/22/19 [phone]

## 2019-02-22 NOTE — ED Notes (Signed)
Resumed care from [place] rn.  Iv fluids infusing  Family with pt.  nsr on monitor.

## 2019-02-23 LAB — URINALYSIS, COMPLETE (UACMP) WITH MICROSCOPIC
Bacteria, UA: NONE SEEN
Bilirubin Urine: NEGATIVE
Glucose, UA: NEGATIVE mg/dL
Ketones, ur: NEGATIVE mg/dL
Nitrite: NEGATIVE
Protein, ur: NEGATIVE mg/dL
Specific Gravity, Urine: 1.019 (ref 1.005–1.030)
pH: 5 (ref 5.0–8.0)

## 2019-02-23 NOTE — ED Notes (Signed)
Pt resting quietly.  Pt alert.  nsr on monitor.

## 2019-02-23 NOTE — ED Provider Notes (Signed)
UA without findings concerning for infection. The patient's repeat orthostatic vital signs did show improvement and patient did clinically feel better. Has appointment scheduled with cardiology tomorrow. Discussed return precautions.    Nance Pear, MD 02/23/19 [phone]

## 2019-02-24 ENCOUNTER — Other Ambulatory Visit: Payer: Self-pay

## 2019-02-24 ENCOUNTER — Encounter: Payer: Self-pay | Admitting: Emergency Medicine

## 2019-02-24 ENCOUNTER — Inpatient Hospital Stay
Admission: EM | Admit: 2019-02-24 | Discharge: 2019-02-26 | DRG: 378 | Disposition: A | Discharge status: Home / Self Care | Payer: Medicare Other | Attending: Internal Medicine | Admitting: Internal Medicine

## 2019-02-24 ENCOUNTER — Emergency Department: Payer: Medicare Other

## 2019-02-24 DIAGNOSIS — K921 Melena: Secondary | ICD-10-CM | POA: Diagnosis not present

## 2019-02-24 DIAGNOSIS — H409 Unspecified glaucoma: Secondary | ICD-10-CM | POA: Diagnosis present

## 2019-02-24 DIAGNOSIS — F329 Major depressive disorder, single episode, unspecified: Secondary | ICD-10-CM | POA: Diagnosis present

## 2019-02-24 DIAGNOSIS — I10 Essential (primary) hypertension: Secondary | ICD-10-CM | POA: Diagnosis present

## 2019-02-24 DIAGNOSIS — D62 Acute posthemorrhagic anemia: Secondary | ICD-10-CM | POA: Diagnosis present

## 2019-02-24 DIAGNOSIS — R55 Syncope and collapse: Secondary | ICD-10-CM

## 2019-02-24 DIAGNOSIS — Z885 Allergy status to narcotic agent status: Secondary | ICD-10-CM | POA: Diagnosis not present

## 2019-02-24 DIAGNOSIS — Z7984 Long term (current) use of oral hypoglycemic drugs: Secondary | ICD-10-CM

## 2019-02-24 DIAGNOSIS — Z7982 Long term (current) use of aspirin: Secondary | ICD-10-CM | POA: Diagnosis not present

## 2019-02-24 DIAGNOSIS — K254 Chronic or unspecified gastric ulcer with hemorrhage: Secondary | ICD-10-CM | POA: Diagnosis present

## 2019-02-24 DIAGNOSIS — I482 Chronic atrial fibrillation, unspecified: Secondary | ICD-10-CM | POA: Diagnosis present

## 2019-02-24 DIAGNOSIS — Z888 Allergy status to other drugs, medicaments and biological substances status: Secondary | ICD-10-CM

## 2019-02-24 DIAGNOSIS — Z79899 Other long term (current) drug therapy: Secondary | ICD-10-CM

## 2019-02-24 DIAGNOSIS — Z20828 Contact with and (suspected) exposure to other viral communicable diseases: Secondary | ICD-10-CM | POA: Diagnosis present

## 2019-02-24 DIAGNOSIS — E785 Hyperlipidemia, unspecified: Secondary | ICD-10-CM | POA: Diagnosis present

## 2019-02-24 DIAGNOSIS — Z7901 Long term (current) use of anticoagulants: Secondary | ICD-10-CM

## 2019-02-24 DIAGNOSIS — K922 Gastrointestinal hemorrhage, unspecified: Secondary | ICD-10-CM | POA: Diagnosis present

## 2019-02-24 DIAGNOSIS — R531 Weakness: Secondary | ICD-10-CM

## 2019-02-24 DIAGNOSIS — Z79890 Hormone replacement therapy (postmenopausal): Secondary | ICD-10-CM | POA: Diagnosis not present

## 2019-02-24 DIAGNOSIS — K228 Other specified diseases of esophagus: Secondary | ICD-10-CM | POA: Diagnosis not present

## 2019-02-24 DIAGNOSIS — F1990 Other psychoactive substance use, unspecified, uncomplicated: Secondary | ICD-10-CM | POA: Diagnosis not present

## 2019-02-24 DIAGNOSIS — M1990 Unspecified osteoarthritis, unspecified site: Secondary | ICD-10-CM | POA: Diagnosis present

## 2019-02-24 DIAGNOSIS — Z66 Do not resuscitate: Secondary | ICD-10-CM | POA: Diagnosis present

## 2019-02-24 DIAGNOSIS — Z87891 Personal history of nicotine dependence: Secondary | ICD-10-CM

## 2019-02-24 DIAGNOSIS — Z9049 Acquired absence of other specified parts of digestive tract: Secondary | ICD-10-CM | POA: Diagnosis not present

## 2019-02-24 DIAGNOSIS — E039 Hypothyroidism, unspecified: Secondary | ICD-10-CM | POA: Diagnosis present

## 2019-02-24 DIAGNOSIS — E119 Type 2 diabetes mellitus without complications: Secondary | ICD-10-CM | POA: Diagnosis present

## 2019-02-24 DIAGNOSIS — Z833 Family history of diabetes mellitus: Secondary | ICD-10-CM | POA: Diagnosis not present

## 2019-02-24 DIAGNOSIS — K253 Acute gastric ulcer without hemorrhage or perforation: Secondary | ICD-10-CM | POA: Diagnosis not present

## 2019-02-24 LAB — COMPREHENSIVE METABOLIC PANEL
ALT: 15 U/L (ref 0–44)
AST: 15 U/L (ref 15–41)
Albumin: 3.1 g/dL — ABNORMAL LOW (ref 3.5–5.0)
Alkaline Phosphatase: 31 U/L — ABNORMAL LOW (ref 38–126)
Anion gap: 11 (ref 5–15)
BUN: 72 mg/dL — ABNORMAL HIGH (ref 8–23)
CO2: 21 mmol/L — ABNORMAL LOW (ref 22–32)
Calcium: 8.8 mg/dL — ABNORMAL LOW (ref 8.9–10.3)
Chloride: 105 mmol/L (ref 98–111)
Creatinine, Ser: 0.94 mg/dL (ref 0.44–1.00)
GFR calc Af Amer: >60 mL/min (ref >60)
GFR calc non Af Amer: >60 mL/min (ref >60)
Glucose, Bld: 272 mg/dL — ABNORMAL HIGH (ref 70–99)
Potassium: 4.1 mmol/L (ref 3.5–5.1)
Sodium: 137 mmol/L (ref 135–145)
Total Bilirubin: 0.5 mg/dL (ref 0.3–1.2)
Total Protein: 5.2 g/dL — ABNORMAL LOW (ref 6.5–8.1)

## 2019-02-24 LAB — URINALYSIS, COMPLETE (UACMP) WITH MICROSCOPIC
Bacteria, UA: NONE SEEN
Bilirubin Urine: NEGATIVE
Glucose, UA: NEGATIVE mg/dL
Hgb urine dipstick: NEGATIVE
Ketones, ur: NEGATIVE mg/dL
Leukocytes,Ua: NEGATIVE
Nitrite: NEGATIVE
Protein, ur: NEGATIVE mg/dL
Specific Gravity, Urine: 1.016 (ref 1.005–1.030)
pH: 5 (ref 5.0–8.0)

## 2019-02-24 LAB — CBC WITH DIFFERENTIAL/PLATELET
Abs Immature Granulocytes: 0.01 10*3/uL (ref 0.00–0.07)
Basophils Absolute: 0 10*3/uL (ref 0.0–0.1)
Basophils Relative: 0 %
Eosinophils Absolute: 0 10*3/uL (ref 0.0–0.5)
Eosinophils Relative: 0 %
HCT: 22.1 % — ABNORMAL LOW (ref 36.0–46.0)
Hemoglobin: 7.3 g/dL — ABNORMAL LOW (ref 12.0–15.0)
Immature Granulocytes: 0 %
Lymphocytes Relative: 23 %
Lymphs Abs: 1.2 10*3/uL (ref 0.7–4.0)
MCH: 30.8 pg (ref 26.0–34.0)
MCHC: 33 g/dL (ref 30.0–36.0)
MCV: 93.2 fL (ref 80.0–100.0)
Monocytes Absolute: 0.3 10*3/uL (ref 0.1–1.0)
Monocytes Relative: 6 %
Neutro Abs: 3.7 10*3/uL (ref 1.7–7.7)
Neutrophils Relative %: 71 %
Platelets: 200 10*3/uL (ref 150–400)
RBC: 2.37 MIL/uL — ABNORMAL LOW (ref 3.87–5.11)
RDW: 15.5 % (ref 11.5–15.5)
WBC: 5.2 10*3/uL (ref 4.0–10.5)
nRBC: 0 % (ref 0.0–0.2)

## 2019-02-24 LAB — LIPASE, BLOOD: Lipase: 26 U/L (ref 11–51)

## 2019-02-24 LAB — HEMOGLOBIN AND HEMATOCRIT, BLOOD
HCT: 24.9 % — ABNORMAL LOW (ref 36.0–46.0)
HCT: 20.1 % — ABNORMAL LOW (ref 36.0–46.0)
Hemoglobin: 8.1 g/dL — ABNORMAL LOW (ref 12.0–15.0)
Hemoglobin: 6.5 g/dL — ABNORMAL LOW (ref 12.0–15.0)

## 2019-02-24 LAB — GLUCOSE, CAPILLARY
Glucose-Capillary: 268 mg/dL — ABNORMAL HIGH (ref 70–99)
Glucose-Capillary: 209 mg/dL — ABNORMAL HIGH (ref 70–99)
Glucose-Capillary: 205 mg/dL — ABNORMAL HIGH (ref 70–99)
Glucose-Capillary: 215 mg/dL — ABNORMAL HIGH (ref 70–99)

## 2019-02-24 LAB — SARS CORONAVIRUS 2 BY RT PCR (HOSPITAL ORDER, PERFORMED IN ~~LOC~~ HOSPITAL LAB): SARS Coronavirus 2: NEGATIVE

## 2019-02-24 LAB — PROTIME-INR
INR: 1 (ref 0.8–1.2)
Prothrombin Time: 13.3 seconds (ref 11.4–15.2)

## 2019-02-24 LAB — TROPONIN I (HIGH SENSITIVITY)
Troponin I (High Sensitivity): 9 ng/L (ref <18)
Troponin I (High Sensitivity): 9 ng/L (ref <18)

## 2019-02-24 IMAGING — CR DG CHEST 2V
2 series · 2 of 2 positions shown · non-contrast
Comparison: No prior.

CLINICAL DATA: Weakness.

EXAM:
CHEST - 2 VIEW

[chest lat]
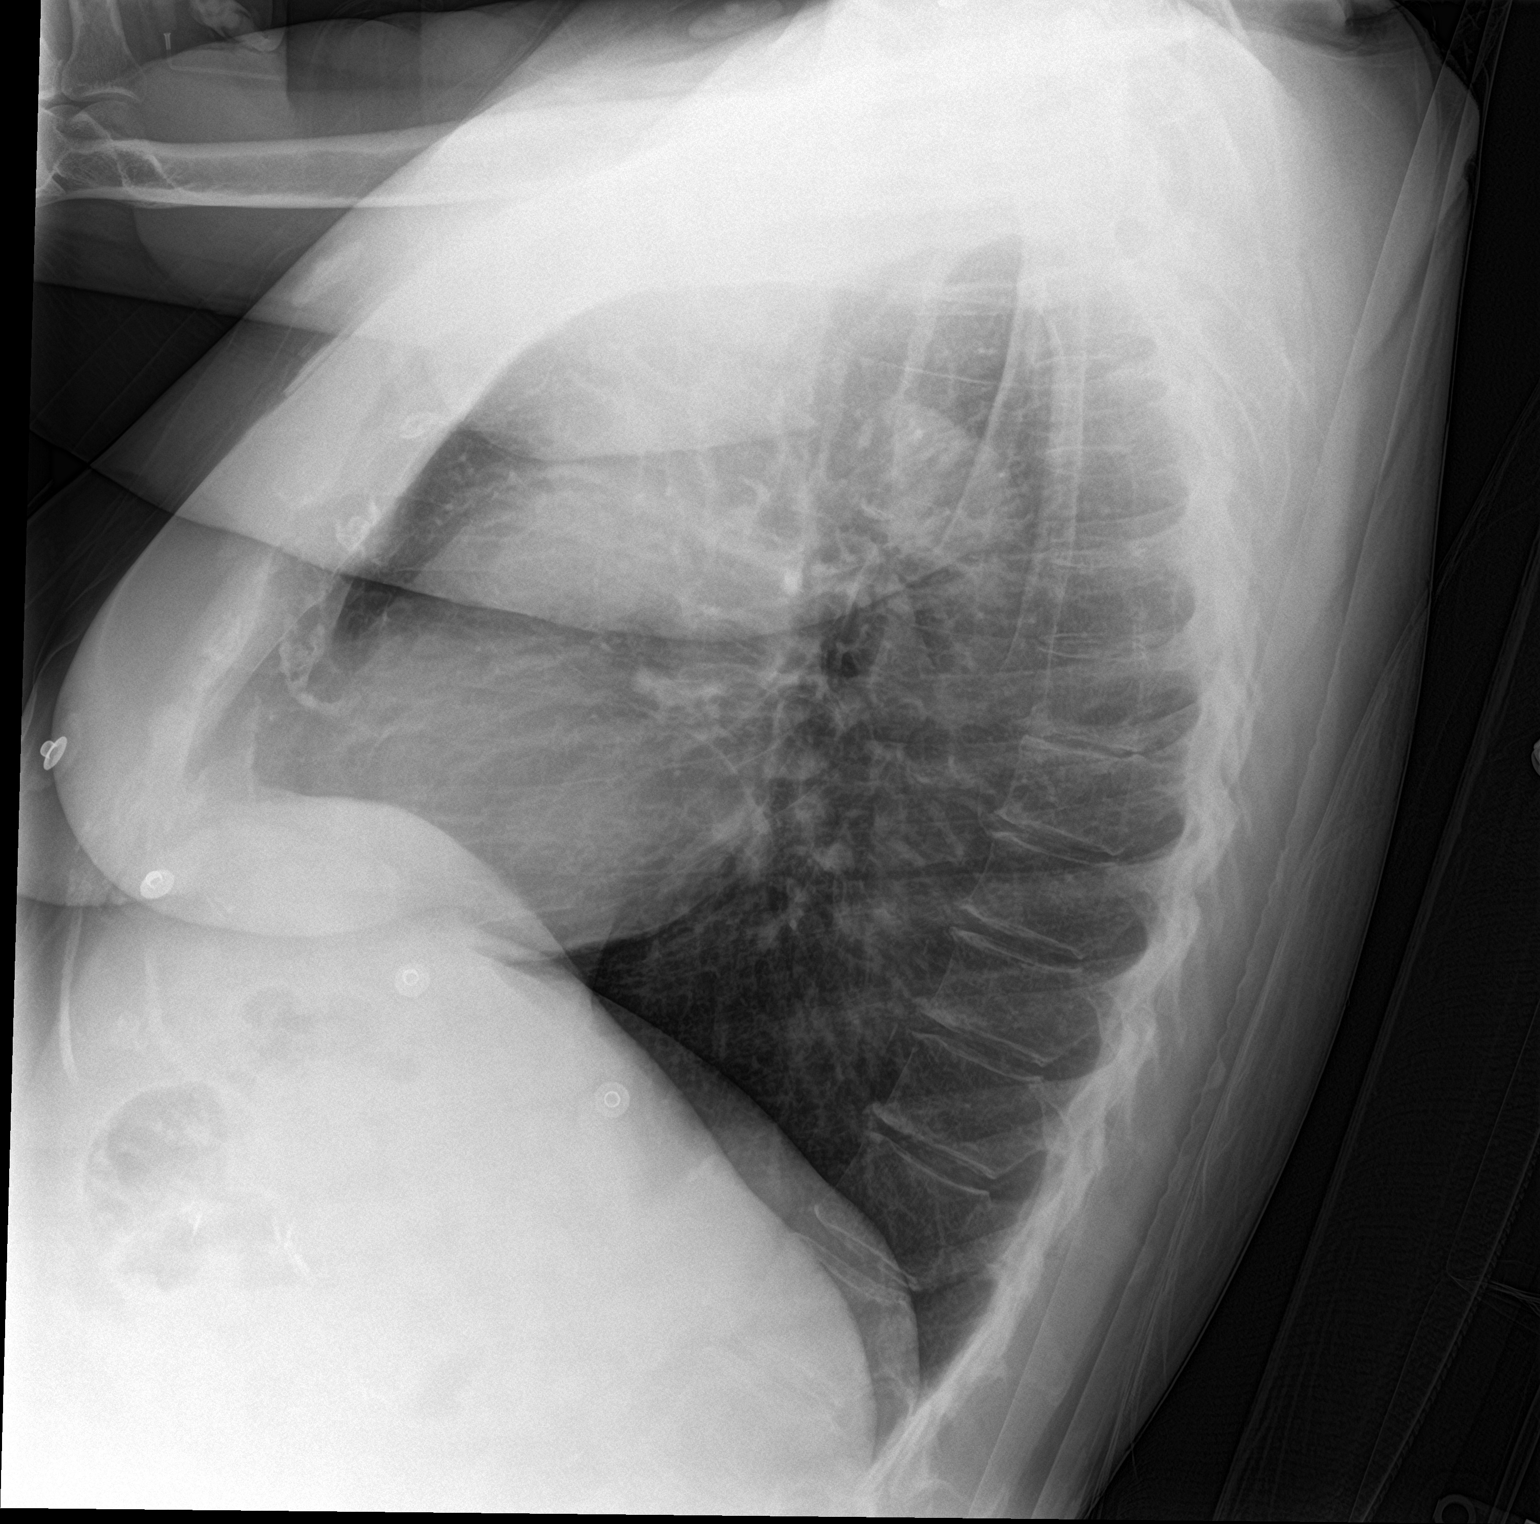

[chest ap]
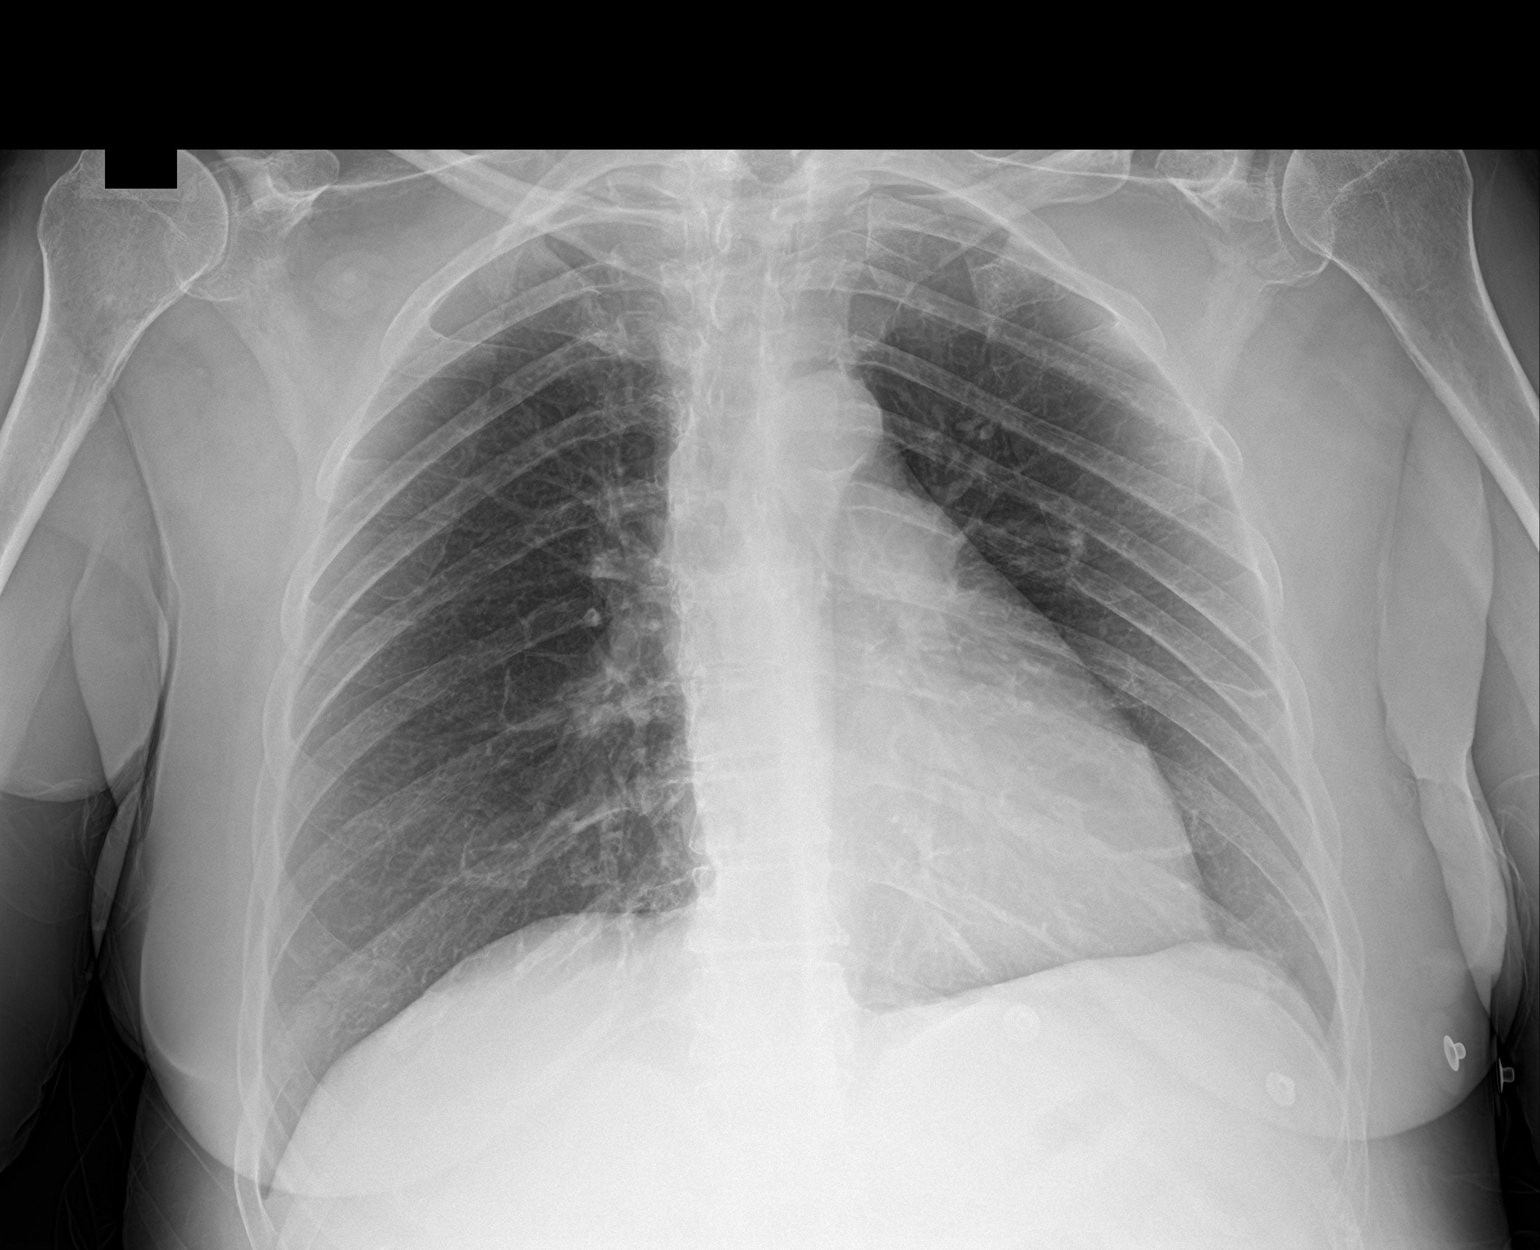

[2 of 2 positions shown; findings below may reference images not displayed]

FINDINGS: Mediastinum and hilar structures normal. Mild left mid lung
subsegmental atelectasis/scarring. No pleural effusion or
pneumothorax. Heart size normal. Thoracic spine scoliosis and
degenerative change.
IMPRESSION: Mild left mid lung subsegmental atelectasis/scarring. Exam otherwise
unremarkable.

## 2019-02-24 MED ORDER — VITAMIN D3 25 MCG (1000 UNIT) PO TABS
1000.0000 [IU] | ORAL_TABLET | Freq: Every day | ORAL | Status: DC
  Administered 2019-02-24 – 2019-02-26 (×3): 1000 [IU] via ORAL
  Filled 2019-02-24 (×5): qty 1

## 2019-02-24 MED ORDER — METOPROLOL SUCCINATE ER 25 MG PO TB24
25.0000 mg | ORAL_TABLET | Freq: Every day | ORAL | Status: DC
  Administered 2019-02-25 – 2019-02-26 (×2): 25 mg via ORAL
  Filled 2019-02-24 (×2): qty 1

## 2019-02-24 MED ORDER — SODIUM CHLORIDE 0.9 % IV SOLN
80.0000 mg | Freq: Once | INTRAVENOUS | Status: AC
  Administered 2019-02-24: 80 mg via INTRAVENOUS
  Filled 2019-02-24: qty 80

## 2019-02-24 MED ORDER — DIAZEPAM 5 MG PO TABS: 2.5000 mg | ORAL_TABLET | Freq: Three times a day (TID) | ORAL | Status: DC | PRN

## 2019-02-24 MED ORDER — SODIUM CHLORIDE 0.9 % IV SOLN
INTRAVENOUS | Status: DC
  Administered 2019-02-24 (×2): via INTRAVENOUS
  Administered 2019-02-25: 1000 mL via INTRAVENOUS

## 2019-02-24 MED ORDER — LISINOPRIL 20 MG PO TABS
20.0000 mg | ORAL_TABLET | Freq: Every day | ORAL | Status: DC
  Administered 2019-02-26: 08:00:00 20 mg via ORAL
  Filled 2019-02-24: qty 1

## 2019-02-24 MED ORDER — AMLODIPINE BESYLATE 10 MG PO TABS
10.0000 mg | ORAL_TABLET | Freq: Every day | ORAL | Status: DC
  Administered 2019-02-25: 21:00:00 10 mg via ORAL
  Filled 2019-02-24: qty 1

## 2019-02-24 MED ORDER — SODIUM CHLORIDE 0.9 % IV SOLN
8.0000 mg/h | INTRAVENOUS | Status: DC
  Administered 2019-02-24 – 2019-02-25 (×3): 8 mg/h via INTRAVENOUS
  Filled 2019-02-24 (×3): qty 80

## 2019-02-24 MED ORDER — LIOTHYRONINE SODIUM 25 MCG PO TABS
25.0000 ug | ORAL_TABLET | Freq: Two times a day (BID) | ORAL | Status: DC
  Administered 2019-02-24 – 2019-02-26 (×5): 25 ug via ORAL
  Filled 2019-02-24 (×7): qty 1

## 2019-02-24 MED ORDER — LEVOTHYROXINE SODIUM 100 MCG PO TABS
100.0000 ug | ORAL_TABLET | Freq: Every day | ORAL | Status: DC
  Administered 2019-02-24 – 2019-02-26 (×2): 100 ug via ORAL
  Filled 2019-02-24: qty 1
  Filled 2019-02-24: qty 2

## 2019-02-24 MED ORDER — INSULIN ASPART 100 UNIT/ML ~~LOC~~ SOLN
0.0000 [IU] | Freq: Three times a day (TID) | SUBCUTANEOUS | Status: DC
  Administered 2019-02-24 (×2): 3 [IU] via SUBCUTANEOUS
  Administered 2019-02-25: 2 [IU] via SUBCUTANEOUS
  Administered 2019-02-25: 1 [IU] via SUBCUTANEOUS
  Administered 2019-02-26: 2 [IU] via SUBCUTANEOUS
  Filled 2019-02-24 (×5): qty 1

## 2019-02-24 MED ORDER — INSULIN ASPART 100 UNIT/ML ~~LOC~~ SOLN
0.0000 [IU] | Freq: Every day | SUBCUTANEOUS | Status: DC
  Administered 2019-02-24: 2 [IU] via SUBCUTANEOUS
  Filled 2019-02-24: qty 1

## 2019-02-24 MED ORDER — PANTOPRAZOLE SODIUM 40 MG IV SOLR: 40.0000 mg | Freq: Two times a day (BID) | INTRAVENOUS | Status: DC

## 2019-02-24 NOTE — Progress Notes (Signed)
Care Alignment Note  Advanced Directives Documents (Living Will, Power of Attorney) currently in the EHR no advanced directives documents available .  Has the patient discussed their wishes with their family/healthcare power of attorney no.  What does the patient/decision maker understand about their medical condition and the natural course of their disease.  GI bleed.  Chronic atrial fibrillation on anticoagulation with Xarelto.  Diabetes mellitus.  Hypothyroidism.  Patient clearly wishes to be DNR/DNI.  Husband at bedside agrees with her decision.  What is the patient/decision maker's biggest fear or concern for the future pain and suffering   What is the most important goal for this patient should their health condition worsen maintenance of function.  Current   Code Status: DNR  Current code status has been reviewed/updated.  Time spent:16 minutes

## 2019-02-24 NOTE — H&P (Signed)
[facility] NAME: Katherine [name]    MR#:  [id number]  DATE OF BIRTH:  [birth year]  DATE OF ADMISSION:  02/24/2019  PRIMARY CARE PHYSICIAN: [name], MD   REQUESTING/REFERRING PHYSICIAN: Cynda Familia  CHIEF COMPLAINT:   Chief Complaint  Patient presents with  . Weakness    HISTORY OF PRESENT ILLNESS:  Katherine [name]  is a 73 y.o. female with a known history of chronic atrial fibrillation on anticoagulation with Xarelto, hypertension, diabetes mellitus and hypothyroidism who presented to the emergency room with complaints of generalized weakness and lightheadedness.  Denies any nausea or vomiting.  No diarrhea.  Reported having dark stools in the past 2 days but got worse last night.  No vomiting of blood.  Patient last dose of Xarelto was on Monday evening.  She takes aspirin as needed as well for arthritis.  Patient was evaluated in the emergency room found to have hemoglobin of 7.3.  Vital signs stable.  Hemoccult test done in the emergency room was positive.  Emergency room physician discussed case with gastroenterologist on-call Dr. Bonna Gains who will see patient in consultation.  Medical service called to admit patient for further evaluation and management.  PAST MEDICAL HISTORY:   Past Medical History:  Diagnosis Date  . Anemia   . Cancer ([place])   . Cystocele   . Depression   . Diabetes mellitus without complication ([place])   . Glaucoma   . Hyperlipidemia   . Hypertension   . Hypothyroidism     PAST SURGICAL HISTORY:   Past Surgical History:  Procedure Laterality Date  . BREAST EXCISIONAL BIOPSY Left 07/2012  . CHOLECYSTECTOMY    . COLONOSCOPY WITH PROPOFOL N/A 08/17/2015   Procedure: COLONOSCOPY WITH PROPOFOL;  Surgeon: Josefine Class, MD;  Location: [facility] ENDOSCOPY;  Service: Endoscopy;  Laterality: N/A;    SOCIAL HISTORY:   Social History   Tobacco Use  . Smoking status: Former Smoker   Packs/day: 1.00    Years: 8.00    Pack years: 8.00    Types: Cigarettes    Quit date: 05/12/1976    Years since quitting: 42.8  . Smokeless tobacco: Never Used  Substance Use Topics  . Alcohol use: Yes    Alcohol/week: 0.0 - 2.0 standard drinks    Comment: occasional, not every week    FAMILY HISTORY:   Family History  Problem Relation Age of Onset  . Diabetes Mother   . Diabetes Father   . Diabetes Brother     DRUG ALLERGIES:   Allergies  Allergen Reactions  . Codeine   . Lipitor [Atorvastatin]   . Morphine And Related   . Pravastatin   . Vytorin [Ezetimibe-Simvastatin]     REVIEW OF SYSTEMS:   Review of Systems  Constitutional: Negative for chills and fever.  HENT: Negative for hearing loss and tinnitus.   Eyes: Negative for blurred vision and double vision.  Respiratory: Negative for cough and shortness of breath.   Cardiovascular: Negative for chest pain and palpitations.  Gastrointestinal: Positive for abdominal pain and blood in stool. Negative for heartburn, nausea and vomiting.  Genitourinary: Negative for dysuria and urgency.  Musculoskeletal: Negative for myalgias and neck pain.  Skin: Negative for itching and rash.  Neurological: Negative for dizziness and headaches.  Psychiatric/Behavioral: Negative for depression and hallucinations.    MEDICATIONS AT HOME:   Prior to Admission medications   Medication Sig Start Date End Date Taking? Authorizing Provider  amLODipine (NORVASC) 10 MG tablet Take 10 mg by mouth at bedtime.    Yes [provider]  cholecalciferol (VITAMIN D) 1000 units tablet Take 1,000 Units by mouth daily.   Yes [provider]  diazepam (VALIUM) 5 MG tablet Take 2.5-5 mg by mouth every 8 (eight) hours as needed for anxiety.    Yes [provider]  furosemide (LASIX) 20 MG tablet Take 20 mg by mouth See admin instructions. Take 1 tablet (20mg ) by mouth daily - take 1 additional tablet (20mg ) by mouth daily if  needed for swelling   Yes [provider]  glimepiride (AMARYL) 2 MG tablet Take 2 mg by mouth daily with breakfast.    Yes [provider]  liothyronine (CYTOMEL) 25 MCG tablet Take 25 mcg by mouth 2 (two) times daily.    Yes [provider]  lisinopril (PRINIVIL,ZESTRIL) 20 MG tablet Take 20 mg by mouth daily.  06/10/17 02/24/19 Yes [provider]  metFORMIN (GLUCOPHAGE) 1000 MG tablet Take 1,000 mg by mouth 2 (two) times daily with a meal.    Yes [provider]  metoprolol succinate (TOPROL-XL) 25 MG 24 hr tablet Take 25 mg by mouth daily.  10/21/17 02/24/19 Yes [provider]  Multiple Vitamin (MULTI-VITAMINS) TABS Take 1 tablet by mouth daily.    Yes [provider]  pioglitazone (ACTOS) 30 MG tablet Take 30 mg by mouth at bedtime.    Yes [provider]  rivaroxaban (XARELTO) 20 MG TABS tablet Take 20 mg by mouth daily with supper.  03/20/15  Yes [provider]  sitaGLIPtin (JANUVIA) 100 MG tablet Take 100 mg by mouth daily.   Yes [provider]  SYNTHROID 100 MCG tablet Take 100 mcg by mouth daily. 03/11/18  Yes [provider]      VITAL SIGNS:  Blood pressure 125/61, pulse 94, temperature 98.3 F (36.8 C), temperature source Oral, resp. rate 11, height 5\' 7"  (1.702 m), weight 99 kg, SpO2 100 %.  PHYSICAL EXAMINATION:  Physical Exam  GENERAL:  73 y.o.-year-old patient lying in the bed with no acute distress.  EYES: Pupils equal, round, reactive to light and accommodation. No scleral icterus. Extraocular muscles intact.  HEENT: Head atraumatic, normocephalic. Oropharynx and nasopharynx clear.  NECK:  Supple, no jugular venous distention. No thyroid enlargement, no tenderness.  LUNGS: Normal breath sounds bilaterally, no wheezing, rales,rhonchi or crepitation. No use of accessory muscles of respiration.  CARDIOVASCULAR: S1, S2 normal. No murmurs, rubs, or gallops.  ABDOMEN: Soft,  nontender, nondistended. Bowel sounds present. No organomegaly or mass.  EXTREMITIES: No pedal edema, cyanosis, or clubbing.  NEUROLOGIC: Cranial nerves II through XII are intact. Muscle strength 5/5 in all extremities. Sensation intact. Gait not checked.  PSYCHIATRIC: The patient is alert and oriented x 3.  SKIN: No obvious rash, lesion, or ulcer.   LABORATORY PANEL:   CBC Recent Labs  Lab 02/24/19 0844  WBC 5.2  HGB 7.3*  HCT 22.1*  PLT 200   ------------------------------------------------------------------------------------------------------------------  Chemistries  Recent Labs  Lab 02/24/19 0844  NA 137  K 4.1  CL 105  CO2 21*  GLUCOSE 272*  BUN 72*  CREATININE 0.94  CALCIUM 8.8*  AST 15  ALT 15  ALKPHOS 31*  BILITOT 0.5   ------------------------------------------------------------------------------------------------------------------  Cardiac Enzymes No results for input(s): TROPONINI in the last 168 hours. ------------------------------------------------------------------------------------------------------------------  RADIOLOGY:  Dg Chest 2 View  Result Date: 02/24/2019 CLINICAL DATA:  Weakness. EXAM: CHEST - 2 VIEW COMPARISON:  No prior. FINDINGS: Mediastinum and hilar structures normal. Mild left mid lung subsegmental atelectasis/scarring. No pleural effusion or pneumothorax. Heart size normal. Thoracic spine scoliosis and degenerative change. IMPRESSION: Mild left mid lung subsegmental atelectasis/scarring. Exam otherwise unremarkable. Electronically Signed   By: Marcello Moores  Register   On: 02/24/2019 09:34      IMPRESSION AND PLAN:  Patient is a 73 year old female with history of chronic atrial fibrillation on anticoagulation with Xarelto, hypertension, diabetes mellitus and hypothyroidism who presented to the emergency room with complaints of generalized weakness and lightheadedness.  Being admitted for further evaluation of acute blood loss anemia  secondary to GI bleed  1.  GI bleed Patient presented with melena stools.  Noted drop in hemoglobin from 11.6 days ago to 7.3 this morning.  Patient already typed and crossmatched.  To transfuse if further drop in hemoglobin.  No evidence of active overt GI bleed at this time.  Hemoccult positive. Patient started on Protonix drip. Gastroenterology is consulted.  Patient's last dose of Xarelto was Monday evening. Monitor hemoglobin and hematocrit every 6-8 hours. Keep n.p.o. for now until seen by gastroenterologist.  IV fluid hydration.  2.  Acute blood loss anemia Secondary to GI bleed. We will transfuse if hemoglobin drops below 7 or any overt GI bleed. Monitor serial hemoglobin.  3.  Diabetes mellitus type 2 Hold off on home dose of Metformin, Actos and Amaryl since patient is n.p.o. at this time. Placed on sliding scale insulin coverage.  Glycosylated hemoglobin level in a.m.  4.  Hypothyroidism Resume home dose of Synthroid.  TSH level in a.m.  5.  Chronic atrial fibrillation Continue rate control with beta-blocker. Holding off on Xarelto due to ongoing evaluation for GI bleed.  DVT prophylaxis; SCDs for now Avoiding heparin products due to GI bleed  All the records are reviewed and case discussed with ED provider. Management plans discussed with the patient, family and they are in agreement. Updated patient's husband at bedside.  All questions were answered.  Patient awake and alert and oriented and clearly able to make her own decisions.  Clearly wishes to be DNR/DNI.  CODE STATUS: DNR DNI  TOTAL TIME TAKING CARE OF THIS PATIENT: 62 minutes.    Deadrian Toya M.D on 02/24/2019 at 11:22 AM  Between 7am to 6pm - Pager - [phone]  After 6pm go to [email or web address] - Proofreader  [facility]  Office  [phone]  CC: Primary care physician; [name], MD   Note: This dictation was prepared with Dragon dictation along with smaller  phrase technology. Any transcriptional and Amaryl that result from this process are unintentional.

## 2019-02-24 NOTE — Consult Note (Signed)
Katherine Antigua, MD [address], [place], [place], [place], [address] [address], [place], [place], [place], [address] Phone: [phone]  Fax: [phone]  Consultation  Referring Provider:     Dr. Nathaniel [name] Primary Care Physician:  Katherine Pitch, MD Reason for Consultation:     Melena  Date of Admission:  02/24/2019 Date of Consultation:  02/24/2019         HPI:   Katherine [name] is a 73 y.o. female with history of A. fib, on Xarelto, also takes aspirin as needed at home, 4-6 regular aspirins a day, presents with 2 to 3-day history of melena 3-4 episodes, and generalized weakness.  Last melanotic episode this morning around 3 AM.  No vomiting of blood.,  Hemoglobin noted to be 7.3, with baseline with baseline normal around 12.  No abdominal pain.  Colonoscopy in 2017 with Dr. Thurmond Butts, for rectal bleeding, CT showing inflammation of the sigmoid and descending colon and diarrhea, was normal except for hemorrhoids.  Past Medical History:  Diagnosis Date  . Anemia   . Cancer ([place])   . Cystocele   . Depression   . Diabetes mellitus without complication ([place])   . Glaucoma   . Hyperlipidemia   . Hypertension   . Hypothyroidism     Past Surgical History:  Procedure Laterality Date  . BREAST EXCISIONAL BIOPSY Left 07/2012  . CHOLECYSTECTOMY    . COLONOSCOPY WITH PROPOFOL N/A 08/17/2015   Procedure: COLONOSCOPY WITH PROPOFOL;  Surgeon: Josefine Class, MD;  Location: [facility] ENDOSCOPY;  Service: Endoscopy;  Laterality: N/A;    Prior to Admission medications   Medication Sig Start Date End Date Taking? Authorizing Provider  amLODipine (NORVASC) 10 MG tablet Take 10 mg by mouth at bedtime.    Yes [provider]  cholecalciferol (VITAMIN D) 1000 units tablet Take 1,000 Units by mouth daily.   Yes [provider]  diazepam (VALIUM) 5 MG tablet Take 2.5-5 mg by mouth every 8 (eight) hours as needed for anxiety.    Yes [provider]  furosemide  (LASIX) 20 MG tablet Take 20 mg by mouth See admin instructions. Take 1 tablet (20mg ) by mouth daily - take 1 additional tablet (20mg ) by mouth daily if needed for swelling   Yes [provider]  glimepiride (AMARYL) 2 MG tablet Take 2 mg by mouth daily with breakfast.    Yes [provider]  liothyronine (CYTOMEL) 25 MCG tablet Take 25 mcg by mouth 2 (two) times daily.    Yes [provider]  lisinopril (PRINIVIL,ZESTRIL) 20 MG tablet Take 20 mg by mouth daily.  06/10/17 02/24/19 Yes [provider]  metFORMIN (GLUCOPHAGE) 1000 MG tablet Take 1,000 mg by mouth 2 (two) times daily with a meal.    Yes [provider]  metoprolol succinate (TOPROL-XL) 25 MG 24 hr tablet Take 25 mg by mouth daily.  10/21/17 02/24/19 Yes [provider]  Multiple Vitamin (MULTI-VITAMINS) TABS Take 1 tablet by mouth daily.    Yes [provider]  pioglitazone (ACTOS) 30 MG tablet Take 30 mg by mouth at bedtime.    Yes [provider]  rivaroxaban (XARELTO) 20 MG TABS tablet Take 20 mg by mouth daily with supper.  03/20/15  Yes [provider]  sitaGLIPtin (JANUVIA) 100 MG tablet Take 100 mg by mouth daily.   Yes [provider]  SYNTHROID 100 MCG tablet Take 100 mcg by mouth daily. 03/11/18  Yes [provider]  Family History  Problem Relation Age of Onset  . Diabetes Mother   . Diabetes Father   . Diabetes Brother      Social History   Tobacco Use  . Smoking status: Former Smoker    Packs/day: 1.00    Years: 8.00    Pack years: 8.00    Types: Cigarettes    Quit date: 05/12/1976    Years since quitting: 42.8  . Smokeless tobacco: Never Used  Substance Use Topics  . Alcohol use: Yes    Alcohol/week: 0.0 - 2.0 standard drinks    Comment: occasional, not every week  . Drug use: No    Allergies as of 02/24/2019 - Review Complete 02/24/2019  Allergen Reaction Noted  . Codeine  08/16/2015  . Lipitor  [atorvastatin]  08/16/2015  . Morphine and related  08/16/2015  . Pravastatin  08/16/2015  . Vytorin [ezetimibe-simvastatin]  08/16/2015    Review of Systems:    All systems reviewed and negative except where noted in HPI.   Physical Exam:  Vital signs in last 24 hours: Vitals:   02/24/19 1100 02/24/19 1115 02/24/19 1130 02/24/19 1145  BP: (!) 125/56 (!) 115/54 107/77 114/64  Pulse: (!) 102 92 96 96  Resp: 18 17 15 19   Temp:      TempSrc:      SpO2: 100% 100% 100% 99%  Weight:      Height:         General:   Pleasant, cooperative in NAD Head:  Normocephalic and atraumatic. Eyes:   No icterus.   Conjunctiva pink. PERRLA. Ears:  Normal auditory acuity. Neck:  Supple; no masses or thyroidomegaly Lungs: Respirations even and unlabored. Lungs clear to auscultation bilaterally.   No wheezes, crackles, or rhonchi.  Abdomen:  Soft, nondistended, nontender. Normal bowel sounds. No appreciable masses or hepatomegaly.  No rebound or guarding.  Neurologic:  Alert and oriented x3;  grossly normal neurologically. Skin:  Intact without significant lesions or rashes. Cervical Nodes:  No significant cervical adenopathy. Psych:  Alert and cooperative. Normal affect.  LAB RESULTS: Recent Labs    02/22/19 1846 02/24/19 0844  WBC 4.7 5.2  HGB 11.6* 7.3*  HCT 35.9* 22.1*  PLT 97* 200   BMET Recent Labs    02/22/19 1846 02/24/19 0844  NA 138 137  K 4.5 4.1  CL 102 105  CO2 22 21*  GLUCOSE 241* 272*  BUN 47* 72*  CREATININE 1.29* 0.94  CALCIUM 9.2 8.8*   LFT Recent Labs    02/24/19 0844  PROT 5.2*  ALBUMIN 3.1*  AST 15  ALT 15  ALKPHOS 31*  BILITOT 0.5   PT/INR Recent Labs    02/24/19 0919  LABPROT 13.3  INR 1.0    STUDIES: Dg Chest 2 View  Result Date: 02/24/2019 CLINICAL DATA:  Weakness. EXAM: CHEST - 2 VIEW COMPARISON:  No prior. FINDINGS: Mediastinum and hilar structures normal. Mild left mid lung subsegmental atelectasis/scarring. No pleural effusion or  pneumothorax. Heart size normal. Thoracic spine scoliosis and degenerative change. IMPRESSION: Mild left mid lung subsegmental atelectasis/scarring. Exam otherwise unremarkable. Electronically Signed   By: Marcello Moores  Register   On: 02/24/2019 09:34      Impression / Plan:   Katherine [name] is a 73 y.o. y/o female with melena, on Xarelto and as needed aspirin the patient has been taking  Findings suspicious for melena from possible peptic ulcer disease given her frequent NSAID use. Patient was strongly advised against this and risks  of frequent NSAID use were discussed in detail  Last Xarelto use was Monday afternoon  PPI IV twice daily  Continue serial CBCs and transfuse PRN Avoid NSAIDs Maintain 2 large-bore IV lines Please page GI with any acute hemodynamic changes, or signs of active GI bleeding  We will proceed with upper endoscopy on this admission  I have discussed alternative options, risks & benefits,  which include, but are not limited to, bleeding, infection, perforation,respiratory complication & drug reaction.  The patient agrees with this plan & written consent will be obtained.     Thank you for involving me in the care of this patient.      LOS: 0 days   Virgel Manifold, MD  02/24/2019, 12:04 PM

## 2019-02-24 NOTE — ED Triage Notes (Signed)
Ems from home for weakness, near syncope, stomach upset x 3 days. Pt A/O. Per pt she has not felt like taking her meds x 3 days. Pt with hx DM, A-Fib, HTN.

## 2019-02-24 NOTE — ED Provider Notes (Signed)
[facility] Emergency Department Provider Note   ____________________________________________   First MD Initiated Contact with Patient 02/24/19 [phone]     (approximate)  I have reviewed the triage vital signs and the nursing notes.   HISTORY  Chief Complaint Weakness    HPI Katherine [name] is a 73 y.o. female with past medical history of atrial fibrillation on Xarelto, hypertension, diabetes who presents to the ED complaining of weakness and lightheadedness.  Patient reports she has had 3 days of gradually worsening generalized weakness as well as lightheadedness and feeling like she might pass out.  This is been associated with a very poor appetite and patient has not had much to eat or drink in the past 24 to 48 hours.  She describes a "gnawing" pain in her upper abdomen that seems to be worse with eating.  She has also noticed dark tarry stools over approximately the past 2 days.  She denies any history of GI bleeds, also denies any history of liver disease.  She does admit to drinking alcohol occasionally, also reports taking 30 and 24 mg of aspirin daily for arthritis.        Past Medical History:  Diagnosis Date  . Anemia   . Cancer ([place])   . Cystocele   . Depression   . Diabetes mellitus without complication ([place])   . Glaucoma   . Hyperlipidemia   . Hypertension   . Hypothyroidism     Patient Active Problem List   Diagnosis Date Noted  . GI bleed 02/24/2019  . Lower GI bleed 06/25/2015    Past Surgical History:  Procedure Laterality Date  . BREAST EXCISIONAL BIOPSY Left 07/2012  . CHOLECYSTECTOMY    . COLONOSCOPY WITH PROPOFOL N/A 08/17/2015   Procedure: COLONOSCOPY WITH PROPOFOL;  Surgeon: Josefine Class, MD;  Location: [facility] ENDOSCOPY;  Service: Endoscopy;  Laterality: N/A;    Prior to Admission medications   Medication Sig Start Date End Date Taking? Authorizing Provider  amLODipine (NORVASC) 10 MG tablet Take 10 mg by mouth  at bedtime.    Yes [provider]  cholecalciferol (VITAMIN D) 1000 units tablet Take 1,000 Units by mouth daily.   Yes [provider]  diazepam (VALIUM) 5 MG tablet Take 2.5-5 mg by mouth every 8 (eight) hours as needed for anxiety.    Yes [provider]  furosemide (LASIX) 20 MG tablet Take 20 mg by mouth See admin instructions. Take 1 tablet (20mg ) by mouth daily - take 1 additional tablet (20mg ) by mouth daily if needed for swelling   Yes [provider]  glimepiride (AMARYL) 2 MG tablet Take 2 mg by mouth daily with breakfast.    Yes [provider]  liothyronine (CYTOMEL) 25 MCG tablet Take 25 mcg by mouth 2 (two) times daily.    Yes [provider]  lisinopril (PRINIVIL,ZESTRIL) 20 MG tablet Take 20 mg by mouth daily.  06/10/17 02/24/19 Yes [provider]  metFORMIN (GLUCOPHAGE) 1000 MG tablet Take 1,000 mg by mouth 2 (two) times daily with a meal.    Yes [provider]  metoprolol succinate (TOPROL-XL) 25 MG 24 hr tablet Take 25 mg by mouth daily.  10/21/17 02/24/19 Yes [provider]  Multiple Vitamin (MULTI-VITAMINS) TABS Take 1 tablet by mouth daily.    Yes [provider]  pioglitazone (ACTOS) 30 MG tablet Take 30 mg by mouth at bedtime.    Yes [provider]  rivaroxaban (XARELTO) 20 MG  TABS tablet Take 20 mg by mouth daily with supper.  03/20/15  Yes [provider]  sitaGLIPtin (JANUVIA) 100 MG tablet Take 100 mg by mouth daily.   Yes [provider]  SYNTHROID 100 MCG tablet Take 100 mcg by mouth daily. 03/11/18  Yes [provider]    Allergies Codeine, Lipitor [atorvastatin], Morphine and related, Pravastatin, and Vytorin [ezetimibe-simvastatin]  Family History  Problem Relation Age of Onset  . Diabetes Mother   . Diabetes Father   . Diabetes Brother     Social History Social History   Tobacco Use  . Smoking status: Former Smoker     Packs/day: 1.00    Years: 8.00    Pack years: 8.00    Types: Cigarettes    Quit date: 05/12/1976    Years since quitting: 42.8  . Smokeless tobacco: Never Used  Substance Use Topics  . Alcohol use: Yes    Alcohol/week: 0.0 - 2.0 standard drinks    Comment: occasional, not every week  . Drug use: No    Review of Systems  Constitutional: No fever/chills Eyes: No visual changes. ENT: No sore throat. Cardiovascular: Denies chest pain. Respiratory: Denies shortness of breath. Gastrointestinal: Positive for abdominal pain.  No nausea, no vomiting.  No diarrhea.  No constipation.  Positive for bloody stool. Genitourinary: Negative for dysuria. Musculoskeletal: Negative for back pain. Skin: Negative for rash. Neurological: Negative for headaches, focal weakness or numbness.  Positive for generalized weakness and near syncope.  ____________________________________________   PHYSICAL EXAM:  VITAL SIGNS: ED Triage Vitals  Enc Vitals Group     BP      Pulse      Resp      Temp      Temp src      SpO2      Weight      Height      Head Circumference      Peak Flow      Pain Score      Pain Loc      Pain Edu?      Excl. in [place]?     Constitutional: Alert and oriented.  Pale appearing. Eyes: Conjunctivae are normal. Head: Atraumatic. Nose: No congestion/rhinnorhea. Mouth/Throat: Mucous membranes are moist. Neck: Normal ROM Cardiovascular: Normal rate, regular rhythm. Grossly normal heart sounds. Respiratory: Normal respiratory effort.  No retractions. Lungs CTAB. Gastrointestinal: Soft and nontender. No distention.  Melanotic stool on exam which is guaiac positive. Genitourinary: deferred Musculoskeletal: No lower extremity tenderness nor edema. Neurologic:  Normal speech and language. No gross focal neurologic deficits are appreciated. Skin:  Skin is warm, dry and intact. No rash noted. Psychiatric: Mood and affect are normal. Speech and behavior are normal.   ____________________________________________   LABS (all labs ordered are listed, but only abnormal results are displayed)  Labs Reviewed  COMPREHENSIVE METABOLIC PANEL - Abnormal; Notable for the following components:      Result Value   CO2 21 (*)    Glucose, Bld 272 (*)    BUN 72 (*)    Calcium 8.8 (*)    Total Protein 5.2 (*)    Albumin 3.1 (*)    Alkaline Phosphatase 31 (*)    All other components within normal limits  CBC WITH DIFFERENTIAL/PLATELET - Abnormal; Notable for the following components:   RBC 2.37 (*)    Hemoglobin 7.3 (*)    HCT 22.1 (*)    All other components within normal limits  URINALYSIS, COMPLETE (UACMP)  WITH MICROSCOPIC - Abnormal; Notable for the following components:   Color, Urine YELLOW (*)    APPearance CLEAR (*)    All other components within normal limits  GLUCOSE, CAPILLARY - Abnormal; Notable for the following components:   Glucose-Capillary 268 (*)    All other components within normal limits  HEMOGLOBIN AND HEMATOCRIT, BLOOD - Abnormal; Notable for the following components:   Hemoglobin 8.1 (*)    HCT 24.9 (*)    All other components within normal limits  GLUCOSE, CAPILLARY - Abnormal; Notable for the following components:   Glucose-Capillary 209 (*)    All other components within normal limits  SARS CORONAVIRUS 2 BY RT PCR (HOSPITAL ORDER, [place] [facility])  LIPASE, BLOOD  PROTIME-INR  HEMOGLOBIN AND HEMATOCRIT, BLOOD  HEMOGLOBIN AND HEMATOCRIT, BLOOD  POCT GASTRIC OCCULT BLOOD (1-CARD TO LAB)  TYPE AND SCREEN  TROPONIN I (HIGH SENSITIVITY)  TROPONIN I (HIGH SENSITIVITY)   ____________________________________________  EKG  ED ECG REPORT I, Blake Divine, the attending physician, personally viewed and interpreted this ECG.   Date: 02/24/2019  EKG Time: 8:41  Rate: 93  Rhythm: atrial fibrillation, rate 93  Axis: Normal  Intervals:left bundle branch block  ST&T Change: Negative sgarbossa     PROCEDURES  Procedure(s) performed (including Critical Care):  .Critical Care Performed by: Blake Divine, MD Authorized by: Blake Divine, MD   Critical care provider statement:    Critical care time (minutes):  45   Critical care time was exclusive of:  Separately billable procedures and treating other patients and teaching time   Critical care was necessary to treat or prevent imminent or life-threatening deterioration of the following conditions: GI Bleeding.   Critical care was time spent personally by me on the following activities:  Discussions with consultants, evaluation of patient's response to treatment, examination of patient, ordering and performing treatments and interventions, ordering and review of laboratory studies, ordering and review of radiographic studies, pulse oximetry, re-evaluation of patient's condition, obtaining history from patient or surrogate and review of old charts   I assumed direction of critical care for this patient from another provider in my specialty: no       ____________________________________________   INITIAL IMPRESSION / ASSESSMENT AND PLAN / ED COURSE       73 year old female with history of atrial fibrillation on Xarelto presents to the ED with increasing weakness, lethargy, and near syncope over the past 3 days.  Patient noted melanotic stools at home and rectal exam was performed confirming melena which is guaiac positive.  Given describes gnawing pain, concern for bleeding ulcer, especially given patient's significant aspirin use.  She has no focal tenderness on her abdominal exam, CT imaging not indicated at this time.  Will check H&H and determine need for transfusion, start patient on Protonix drip.  Given no history of liver disease, doubt variceal bleed.  Case discussed with GI, who state patient will need to be off of Xarelto for at least 48 hours before EGD BMP performed.  Patient remains hemodynamically stable at this time,  states she has been off Xarelto for about the past 36 hours.  Hemoglobin noted to be 7.3, will hold off on blood transfusion for now as patient does not appear to have major ongoing bleeding.  Case discussed with hospitalist, who accepts patient for admission.      ____________________________________________   FINAL CLINICAL IMPRESSION(S) / ED DIAGNOSES  Final diagnoses:  Upper GI bleed  Generalized weakness  Near syncope  ED Discharge Orders    None       Note:  This document was prepared using Dragon voice recognition software and may include unintentional dictation errors.   Blake Divine, MD 02/24/19 [phone]

## 2019-02-24 NOTE — ED Notes (Signed)
Patient transported to X-ray 

## 2019-02-25 ENCOUNTER — Inpatient Hospital Stay: Payer: Medicare Other | Admitting: Certified Registered"

## 2019-02-25 ENCOUNTER — Encounter
Admission: EM | Disposition: A | Discharge status: Home / Self Care | Payer: Self-pay | Source: Home / Self Care | Attending: Internal Medicine

## 2019-02-25 DIAGNOSIS — K228 Other specified diseases of esophagus: Secondary | ICD-10-CM

## 2019-02-25 DIAGNOSIS — K253 Acute gastric ulcer without hemorrhage or perforation: Secondary | ICD-10-CM

## 2019-02-25 DIAGNOSIS — D62 Acute posthemorrhagic anemia: Secondary | ICD-10-CM

## 2019-02-25 HISTORY — PX: ESOPHAGOGASTRODUODENOSCOPY: SHX5428

## 2019-02-25 LAB — CBC
HCT: 18.3 % — ABNORMAL LOW (ref 36.0–46.0)
Hemoglobin: 6 g/dL — ABNORMAL LOW (ref 12.0–15.0)
MCH: 31.1 pg (ref 26.0–34.0)
MCHC: 32.8 g/dL (ref 30.0–36.0)
MCV: 94.8 fL (ref 80.0–100.0)
Platelets: 178 10*3/uL (ref 150–400)
RBC: 1.93 MIL/uL — ABNORMAL LOW (ref 3.87–5.11)
RDW: 15.9 % — ABNORMAL HIGH (ref 11.5–15.5)
WBC: 4.7 10*3/uL (ref 4.0–10.5)
nRBC: 0 % (ref 0.0–0.2)

## 2019-02-25 LAB — BASIC METABOLIC PANEL
Anion gap: 7 (ref 5–15)
BUN: 40 mg/dL — ABNORMAL HIGH (ref 8–23)
CO2: 21 mmol/L — ABNORMAL LOW (ref 22–32)
Calcium: 8.7 mg/dL — ABNORMAL LOW (ref 8.9–10.3)
Chloride: 114 mmol/L — ABNORMAL HIGH (ref 98–111)
Creatinine, Ser: 0.72 mg/dL (ref 0.44–1.00)
GFR calc Af Amer: >60 mL/min (ref >60)
GFR calc non Af Amer: >60 mL/min (ref >60)
Glucose, Bld: 121 mg/dL — ABNORMAL HIGH (ref 70–99)
Potassium: 4 mmol/L (ref 3.5–5.1)
Sodium: 142 mmol/L (ref 135–145)

## 2019-02-25 LAB — GLUCOSE, CAPILLARY
Glucose-Capillary: 137 mg/dL — ABNORMAL HIGH (ref 70–99)
Glucose-Capillary: 127 mg/dL — ABNORMAL HIGH (ref 70–99)
Glucose-Capillary: 189 mg/dL — ABNORMAL HIGH (ref 70–99)
Glucose-Capillary: 150 mg/dL — ABNORMAL HIGH (ref 70–99)

## 2019-02-25 LAB — HEMOGLOBIN A1C
Hgb A1c MFr Bld: 6.2 % — ABNORMAL HIGH (ref 4.8–5.6)
Mean Plasma Glucose: 131.24 mg/dL

## 2019-02-25 LAB — HEMOGLOBIN AND HEMATOCRIT, BLOOD
HCT: 21.9 % — ABNORMAL LOW (ref 36.0–46.0)
HCT: 23.4 % — ABNORMAL LOW (ref 36.0–46.0)
Hemoglobin: 7 g/dL — ABNORMAL LOW (ref 12.0–15.0)
Hemoglobin: 7.6 g/dL — ABNORMAL LOW (ref 12.0–15.0)

## 2019-02-25 LAB — TSH: TSH: 0.06 u[IU]/mL — ABNORMAL LOW (ref 0.350–4.500)

## 2019-02-25 LAB — MAGNESIUM: Magnesium: 1.9 mg/dL (ref 1.7–2.4)

## 2019-02-25 LAB — PREPARE RBC (CROSSMATCH)

## 2019-02-25 SURGERY — EGD (ESOPHAGOGASTRODUODENOSCOPY)
Anesthesia: General

## 2019-02-25 MED ORDER — PROPOFOL 10 MG/ML IV BOLUS
INTRAVENOUS | Status: DC | PRN
  Administered 2019-02-25: 80 mg via INTRAVENOUS

## 2019-02-25 MED ORDER — ACETAMINOPHEN 325 MG PO TABS
650.0000 mg | ORAL_TABLET | Freq: Once | ORAL | Status: AC
  Administered 2019-02-25: 650 mg via ORAL
  Filled 2019-02-25: qty 2

## 2019-02-25 MED ORDER — DIPHENHYDRAMINE HCL 25 MG PO CAPS
25.0000 mg | ORAL_CAPSULE | Freq: Once | ORAL | Status: AC
  Administered 2019-02-25: 03:00:00 25 mg via ORAL
  Filled 2019-02-25: qty 1

## 2019-02-25 MED ORDER — SODIUM CHLORIDE 0.9 % IV SOLN
INTRAVENOUS | Status: DC
  Administered 2019-02-25: 13:00:00 via INTRAVENOUS

## 2019-02-25 MED ORDER — SODIUM CHLORIDE 0.9% IV SOLUTION: Freq: Once | INTRAVENOUS | Status: DC

## 2019-02-25 MED ORDER — LIDOCAINE HCL (CARDIAC) PF 100 MG/5ML IV SOSY
PREFILLED_SYRINGE | INTRAVENOUS | Status: DC | PRN
  Administered 2019-02-25: 100 mg via INTRAVENOUS
  Administered 2019-02-25 (×2): 30 mg via INTRAVENOUS

## 2019-02-25 MED ORDER — SODIUM CHLORIDE 0.9% IV SOLUTION
Freq: Once | INTRAVENOUS | Status: AC
  Administered 2019-02-25: 15:00:00 via INTRAVENOUS

## 2019-02-25 MED ORDER — PANTOPRAZOLE SODIUM 40 MG PO TBEC
40.0000 mg | DELAYED_RELEASE_TABLET | Freq: Every day | ORAL | Status: DC
  Administered 2019-02-25 – 2019-02-26 (×2): 40 mg via ORAL
  Filled 2019-02-25: qty 1

## 2019-02-25 NOTE — Progress Notes (Signed)
[place] [facility] [place] NAME: Meike Brasington    MR#:  [id number]  DATE OF BIRTH:  13-Jun-1945  SUBJECTIVE:  CHIEF COMPLAINT:   Chief Complaint  Patient presents with  . Weakness   Dizziness on standing up.  Fatigue. Waiting for EGD.  REVIEW OF SYSTEMS:    Review of Systems  Constitutional: Positive for malaise/fatigue. Negative for chills and fever.  HENT: Negative for sore throat.   Eyes: Negative for blurred vision, double vision and pain.  Respiratory: Negative for cough, hemoptysis, shortness of breath and wheezing.   Cardiovascular: Negative for chest pain, palpitations, orthopnea and leg swelling.  Gastrointestinal: Negative for abdominal pain, constipation, diarrhea, heartburn, nausea and vomiting.  Genitourinary: Negative for dysuria and hematuria.  Musculoskeletal: Negative for back pain and joint pain.  Skin: Negative for rash.  Neurological: Positive for dizziness. Negative for sensory change, speech change, focal weakness and headaches.  Endo/Heme/Allergies: Does not bruise/bleed easily.  Psychiatric/Behavioral: Negative for depression. The patient is not nervous/anxious.     DRUG ALLERGIES:   Allergies  Allergen Reactions  . Codeine   . Lipitor [Atorvastatin]   . Morphine And Related   . Pravastatin   . Vytorin [Ezetimibe-Simvastatin]     VITALS:  Blood pressure (!) 128/47, pulse 71, temperature (!) 96.9 F (36.1 C), temperature source Tympanic, resp. rate 20, height 5\' 7"  (1.702 m), weight 99 kg, SpO2 98 %.  PHYSICAL EXAMINATION:   Physical Exam  GENERAL:  73 y.o.-year-old patient lying in the bed with no acute distress.  EYES: Pupils equal, round, reactive to light and accommodation. No scleral icterus. Extraocular muscles intact.  HEENT: Head atraumatic, normocephalic. Oropharynx and nasopharynx clear.  NECK:  Supple, no jugular venous distention. No thyroid enlargement, no tenderness.  LUNGS: Normal breath  sounds bilaterally, no wheezing, rales, rhonchi. No use of accessory muscles of respiration.  CARDIOVASCULAR: S1, S2 normal. No murmurs, rubs, or gallops.  ABDOMEN: Soft, nontender, nondistended. Bowel sounds present. No organomegaly or mass.  EXTREMITIES: No cyanosis, clubbing or edema b/l.    NEUROLOGIC: Cranial nerves II through XII are intact. No focal Motor or sensory deficits b/l.   PSYCHIATRIC: The patient is alert and oriented x 3.  SKIN: No obvious rash, lesion, or ulcer.   LABORATORY PANEL:   CBC Recent Labs  Lab 02/25/19 0221 02/25/19 0753  WBC 4.7  --   HGB 6.0* 7.0*  HCT 18.3* 21.9*  PLT 178  --    ------------------------------------------------------------------------------------------------------------------ Chemistries  Recent Labs  Lab 02/24/19 0844 02/25/19 0221  NA 137 142  K 4.1 4.0  CL 105 114*  CO2 21* 21*  GLUCOSE 272* 121*  BUN 72* 40*  CREATININE 0.94 0.72  CALCIUM 8.8* 8.7*  MG  --  1.9  AST 15  --   ALT 15  --   ALKPHOS 31*  --   BILITOT 0.5  --    ------------------------------------------------------------------------------------------------------------------  Cardiac Enzymes No results for input(s): TROPONINI in the last 168 hours. ------------------------------------------------------------------------------------------------------------------  RADIOLOGY:  Dg Chest 2 View  Result Date: 02/24/2019 CLINICAL DATA:  Weakness. EXAM: CHEST - 2 VIEW COMPARISON:  No prior. FINDINGS: Mediastinum and hilar structures normal. Mild left mid lung subsegmental atelectasis/scarring. No pleural effusion or pneumothorax. Heart size normal. Thoracic spine scoliosis and degenerative change. IMPRESSION: Mild left mid lung subsegmental atelectasis/scarring. Exam otherwise unremarkable. Electronically Signed   By: Marcello Moores  Register   On: 02/24/2019 09:34     ASSESSMENT AND PLAN:  Patient is a 73 year old female with history of chronic atrial  fibrillation on anticoagulation with Xarelto, hypertension, diabetes mellitus and hypothyroidism who presented to the emergency room with complaints of generalized weakness and lightheadedness.  Being admitted for further evaluation of acute blood loss anemia secondary to GI bleed  1.  GI bleed in setting of NSAID use.  Likely upper GI bleed On Protonix IV.  EGD today.  2.  Acute blood loss anemia Secondary to GI bleed. Transfused 1 unit packed RBC yesterday.  Still symptomatic with borderline hemoglobin of 7 and GI bleed.  Will transfuse 1 more unit packed RBC today.  3.  Diabetes mellitus type 2 Hold off on home dose of Metformin, Actos and Amaryl since patient is n.p.o. at this time. Placed on sliding scale insulin coverage.  Glycosylated hemoglobin level in a.m.  4.  Hypothyroidism Resume home dose of Synthroid.  TSH level in a.m.  5.  Chronic atrial fibrillation Continue rate control with beta-blocker. Holding off on Xarelto due to ongoing evaluation for GI bleed.  DVT prophylaxis; SCDs for now Avoiding heparin products due to GI bleed  All the records are reviewed and case discussed with Care Management/Social Worker Management plans discussed with the patient, family and they are in agreement.  CODE STATUS: Full code  DVT Prophylaxis: SCDs  TOTAL TIME TAKING CARE OF THIS PATIENT: 35 minutes.   POSSIBLE D/C IN 1-2 DAYS, DEPENDING ON CLINICAL CONDITION.  Leia Alf Kaely Hollan M.D on 02/25/2019 at 12:48 PM  Between 7am to 6pm - Pager - [phone]  After 6pm go to [email or web address] - password [email or web address] [facility]  Office  [phone]  CC: Primary care physician; [name], MD  Note: This dictation was prepared with Dragon dictation along with smaller phrase technology. Any transcriptional errors that result from this process are unintentional.

## 2019-02-25 NOTE — Transfer of Care (Signed)
Immediate Anesthesia Transfer of Care Note  Patient: Katherine [name]  Procedure(s) Performed: ESOPHAGOGASTRODUODENOSCOPY (EGD) (N/A )  Patient Location: Endoscopy Unit  Anesthesia Type:General  Level of Consciousness: awake, alert  and oriented  Airway & Oxygen Therapy: Patient Spontanous Breathing and Patient connected to nasal cannula oxygen  Post-op Assessment: Report given to RN, Post -op Vital signs reviewed and stable and Patient moving all extremities  Post vital signs: Reviewed and stable  Last Vitals:  Vitals Value Taken Time  BP 103/46 02/25/19 1307  Temp 36.2 C 02/25/19 1307  Pulse 20 02/25/19 1307  Resp 20 02/25/19 1307  SpO2 98 % 02/25/19 1307    Last Pain:  Vitals:   02/25/19 1307  TempSrc:   PainSc: 0-No pain         Complications: No apparent anesthesia complications

## 2019-02-25 NOTE — Anesthesia Preprocedure Evaluation (Signed)
Anesthesia Evaluation  Patient identified by MRN, date of birth, ID band Patient awake    Reviewed: Allergy & Precautions, NPO status , Patient's Chart, lab work & pertinent test results  History of Anesthesia Complications Negative for: history of anesthetic complications  Airway Mallampati: III       Dental   Pulmonary neg sleep apnea, neg COPD, Not current smoker, former smoker,           Cardiovascular hypertension, Pt. on medications (-) Past MI and (-) CHF + dysrhythmias Atrial Fibrillation (-) Valvular Problems/Murmurs     Neuro/Psych neg Seizures Depression    GI/Hepatic Neg liver ROS, neg GERD  ,  Endo/Other  diabetes, Type 2, Oral Hypoglycemic AgentsHypothyroidism   Renal/GU negative Renal ROS     Musculoskeletal   Abdominal   Peds  Hematology   Anesthesia Other Findings   Reproductive/Obstetrics                             Anesthesia Physical Anesthesia Plan  ASA: III  Anesthesia Plan: General   Post-op Pain Management:    Induction: Intravenous  PONV Risk Score and Plan: 3 and Propofol infusion, TIVA and Ondansetron  Airway Management Planned: Nasal Cannula  Additional Equipment:   Intra-op Plan:   Post-operative Plan:   Informed Consent: I have reviewed the patients History and Physical, chart, labs and discussed the procedure including the risks, benefits and alternatives for the proposed anesthesia with the patient or authorized representative who has indicated his/her understanding and acceptance.       Plan Discussed with:   Anesthesia Plan Comments:         Anesthesia Quick Evaluation

## 2019-02-25 NOTE — Anesthesia Post-op Follow-up Note (Signed)
Anesthesia QCDR form completed.        

## 2019-02-25 NOTE — Op Note (Signed)
[facility] Gastroenterology Patient Name: Katherine [name] Procedure Date: 02/25/2019 12:33 PM MRN: [id number] Account #: [id number] Date of Birth: [birth year] Admit Type: Outpatient Age: 73 Room: [facility] ENDO ROOM 3 Gender: Female Note Status: Finalized Procedure:            Upper GI endoscopy Indications:          Melena Providers:            Kanton Kamel B. Bonna Gains MD, MD Medicines:            Monitored Anesthesia Care Complications:        No immediate complications. Procedure:            Pre-Anesthesia Assessment:                       - The risks and benefits of the procedure and the                        sedation options and risks were discussed with the                        patient. All questions were answered and informed                        consent was obtained.                       - Patient identification and proposed procedure were                        verified prior to the procedure.                       - ASA Grade Assessment: II - A patient with mild                        systemic disease.                       After obtaining informed consent, the endoscope was                        passed under direct vision. Throughout the procedure,                        the patient's blood pressure, pulse, and oxygen                        saturations were monitored continuously. The Endoscope                        was introduced through the mouth, and advanced to the                        second part of duodenum. The upper GI endoscopy was                        accomplished with ease. The patient tolerated the                        procedure well. Findings:      Salmon-colored mucosa was  present. Biopsies were not done due to       patient's recent GI bleeding.      The exam of the esophagus was otherwise normal.      Few non-bleeding gastric ulcers with a clean ulcer base (Forrest Class       III) were found in the gastric body and in the  gastric antrum. The       largest lesion was 6 mm in largest dimension.      The exam of the stomach was otherwise normal.      The examined duodenum was normal. Impression:           - Salmon-colored mucosa suspicious for short-segment                        Barrett's esophagus.                       - Non-bleeding gastric ulcers with a clean ulcer base                        (Forrest Class III).                       - Normal examined duodenum.                       - No specimens collected. Recommendation:       - Use Protonix (pantoprazole) 40 mg IV daily.                       - Avoid NSAIDs except Aspirin if medically indicated by                        PCP                       - Perform an H. pylori serology today.                       - Advance diet as tolerated.                       - Continue Serial CBCs and transfuse PRN                       - The findings and recommendations were discussed with                        the patient.                       - Repeat upper endoscopy to re-evaluate and biopsy                        salmon colored mucosa and gastric biopsies in 2-30month. Procedure Code(s):    --- Professional ---                       [phone], Esophagogastroduodenoscopy, flexible, transoral;                        diagnostic, including collection of specimen(s) by  brushing or washing, when performed (separate procedure) Diagnosis Code(s):    --- Professional ---                       K22.8, Other specified diseases of esophagus                       K25.9, Gastric ulcer, unspecified as acute or chronic,                        without hemorrhage or perforation                       K92.1, Melena (includes Hematochezia) CPT copyright 2019 [employer] Medical Association. All rights reserved. The codes documented in this report are preliminary and upon coder review may  be revised to meet current compliance requirements.  [name],  MD Margretta Sidle B. Bonna Gains MD, MD 02/25/2019 1:15:20 PM This report has been signed electronically. Number of Addenda: 0 Note Initiated On: 02/25/2019 12:33 PM      [facility]

## 2019-02-25 NOTE — Progress Notes (Signed)
Katherine Antigua, MD [address], [place], [place], [place], [address] [address], [place], [place], [place], [address] Phone: [phone]  Fax: [phone]   Subjective: Hemoglobin low at 6 today. Hemodynamically stable   Objective: Exam: Vital signs in last 24 hours: Vitals:   02/25/19 0358 02/25/19 0642 02/25/19 0806 02/25/19 1130  BP: (!) 123/44 (!) 137/46 129/60 (!) 128/47  Pulse: 92 83 79 71  Resp: 20 19  20   Temp: 97.9 F (36.6 C) 98.4 F (36.9 C)  (!) 96.9 F (36.1 C)  TempSrc: Oral Oral  Tympanic  SpO2: 100% 99%  98%  Weight:      Height:       Weight change:   Intake/Output Summary (Last 24 hours) at 02/25/2019 1234 Last data filed at 02/25/2019 [id number] Gross per 24 hour  Intake 1892.82 ml  Output 1050 ml  Net 842.82 ml    General: No acute distress, AAO x3 Abd: Soft, NT/ND, No HSM Skin: Warm, no rashes Neck: Supple, Trachea midline   Lab Results: Lab Results  Component Value Date   WBC 4.7 02/25/2019   HGB 7.0 (L) 02/25/2019   HCT 21.9 (L) 02/25/2019   MCV 94.8 02/25/2019   PLT 178 02/25/2019   Micro Results: Recent Results (from the past 240 hour(s))  SARS Coronavirus 2 by RT PCR (hospital order, performed in [place] hospital lab) Nasopharyngeal Nasopharyngeal Swab     Status: None   Collection Time: 02/24/19 10:54 AM   Specimen: Nasopharyngeal Swab  Result Value Ref Range Status   SARS Coronavirus 2 NEGATIVE NEGATIVE Final    Comment: (NOTE) If result is NEGATIVE SARS-CoV-2 target nucleic acids are NOT DETECTED. The SARS-CoV-2 RNA is generally detectable in upper and lower  respiratory specimens during the acute phase of infection. The lowest  concentration of SARS-CoV-2 viral copies this assay can detect is 250  copies / mL. A negative result does not preclude SARS-CoV-2 infection  and should not be used as the sole basis for treatment or other  patient management decisions.  A negative result may occur with  improper  specimen collection / handling, submission of specimen other  than nasopharyngeal swab, presence of viral mutation(s) within the  areas targeted by this assay, and inadequate number of viral copies  (<250 copies / mL). A negative result must be combined with clinical  observations, patient history, and epidemiological information. If result is POSITIVE SARS-CoV-2 target nucleic acids are DETECTED. The SARS-CoV-2 RNA is generally detectable in upper and lower  respiratory specimens dur ing the acute phase of infection.  Positive  results are indicative of active infection with SARS-CoV-2.  Clinical  correlation with patient history and other diagnostic information is  necessary to determine patient infection status.  Positive results do  not rule out bacterial infection or co-infection with other viruses. If result is PRESUMPTIVE POSTIVE SARS-CoV-2 nucleic acids MAY BE PRESENT.   A presumptive positive result was obtained on the submitted specimen  and confirmed on repeat testing.  While 2019 novel coronavirus  (SARS-CoV-2) nucleic acids may be present in the submitted sample  additional confirmatory testing may be necessary for epidemiological  and / or clinical management purposes  to differentiate between  SARS-CoV-2 and other Sarbecovirus currently known to infect humans.  If clinically indicated additional testing with an alternate test  methodology [phone] is advised. The SARS-CoV-2 RNA is generally  detectable in upper and lower respiratory sp ecimens during the acute  phase of infection. The  expected result is Negative. Fact Sheet for Patients:  StrictlyIdeas.no Fact Sheet for Healthcare Providers: BankingDealers.co.za This test is not yet approved or cleared by the [place] FDA and has been authorized for detection and/or diagnosis of SARS-CoV-2 by FDA under an Emergency Use Authorization (EUA).  This EUA will remain in  effect (meaning this test can be used) for the duration of the COVID-19 declaration under Section 564(b)(1) of the Act, 21 U.S.C. section 360bbb-3(b)(1), unless the authorization is terminated or revoked sooner. Performed at [facility], [address], [place], [place] [address]    Studies/Results: Dg Chest 2 View  Result Date: 02/24/2019 CLINICAL DATA:  Weakness. EXAM: CHEST - 2 VIEW COMPARISON:  No prior. FINDINGS: Mediastinum and hilar structures normal. Mild left mid lung subsegmental atelectasis/scarring. No pleural effusion or pneumothorax. Heart size normal. Thoracic spine scoliosis and degenerative change. IMPRESSION: Mild left mid lung subsegmental atelectasis/scarring. Exam otherwise unremarkable. Electronically Signed   By: [place]   On: 02/24/2019 09:34   Medications:  Scheduled Meds: . [MAR Hold] sodium chloride   Intravenous Once  . sodium chloride   Intravenous Once  . [MAR Hold] amLODipine  10 mg Oral QHS  . [MAR Hold] cholecalciferol  1,000 Units Oral Daily  . [MAR Hold] insulin aspart  0-5 Units Subcutaneous QHS  . [MAR Hold] insulin aspart  0-9 Units Subcutaneous TID WC  . [MAR Hold] levothyroxine  100 mcg Oral Q0600  . [MAR Hold] liothyronine  25 mcg Oral BID  . [MAR Hold] lisinopril  20 mg Oral Daily  . [MAR Hold] metoprolol succinate  25 mg Oral Daily  . [MAR Hold] pantoprazole  40 mg Intravenous Q12H   Continuous Infusions: . sodium chloride    . pantoprozole (PROTONIX) infusion 8 mg/hr (02/25/19 0807)   PRN Meds:.[MAR Hold] diazepam   Assessment: Active Problems:   GI bleed    Plan: PPI IV twice daily  Continue serial CBCs and transfuse PRN Avoid NSAIDs Maintain 2 large-bore IV lines Please page GI with any acute hemodynamic changes, or signs of active GI bleeding  EGD today for evaluation of PUD and other etiologies.   I have discussed alternative options, risks & benefits,  which include, but are not limited to,  bleeding, infection, perforation,respiratory complication & drug reaction.  The patient agrees with this plan & written consent will be obtained.      LOS: 1 day   Katherine Antigua, MD 02/25/2019, 12:34 PM

## 2019-02-25 NOTE — Anesthesia Postprocedure Evaluation (Signed)
Anesthesia Post Note  Patient: Katherine [name]  Procedure(s) Performed: ESOPHAGOGASTRODUODENOSCOPY (EGD) (N/A )  Patient location during evaluation: Endoscopy Anesthesia Type: General Level of consciousness: awake and alert Pain management: pain level controlled Vital Signs Assessment: post-procedure vital signs reviewed and stable Respiratory status: spontaneous breathing and respiratory function stable Cardiovascular status: stable Anesthetic complications: no     Last Vitals:  Vitals:   02/25/19 1317 02/25/19 1327  BP: 122/62 105/62  Pulse: 78 72  Resp: 14 14  Temp:    SpO2: 99% 100%    Last Pain:  Vitals:   02/25/19 1327  TempSrc:   PainSc: 0-No pain                 KEPHART,WILLIAM K

## 2019-02-26 LAB — BASIC METABOLIC PANEL
Anion gap: 7 (ref 5–15)
BUN: 17 mg/dL (ref 8–23)
CO2: 23 mmol/L (ref 22–32)
Calcium: 9 mg/dL (ref 8.9–10.3)
Chloride: 113 mmol/L — ABNORMAL HIGH (ref 98–111)
Creatinine, Ser: 0.83 mg/dL (ref 0.44–1.00)
GFR calc Af Amer: >60 mL/min (ref >60)
GFR calc non Af Amer: >60 mL/min (ref >60)
Glucose, Bld: 147 mg/dL — ABNORMAL HIGH (ref 70–99)
Potassium: 4 mmol/L (ref 3.5–5.1)
Sodium: 143 mmol/L (ref 135–145)

## 2019-02-26 LAB — TYPE AND SCREEN
ABO/RH(D): A POS
Antibody Screen: NEGATIVE
Unit division: 0
Unit division: 0

## 2019-02-26 LAB — GLUCOSE, CAPILLARY
Glucose-Capillary: 170 mg/dL — ABNORMAL HIGH (ref 70–99)
Glucose-Capillary: 139 mg/dL — ABNORMAL HIGH (ref 70–99)

## 2019-02-26 LAB — CBC WITH DIFFERENTIAL/PLATELET
Abs Immature Granulocytes: 0.01 10*3/uL (ref 0.00–0.07)
Basophils Absolute: 0 10*3/uL (ref 0.0–0.1)
Basophils Relative: 1 %
Eosinophils Absolute: 0.3 10*3/uL (ref 0.0–0.5)
Eosinophils Relative: 7 %
HCT: 24.4 % — ABNORMAL LOW (ref 36.0–46.0)
Hemoglobin: 8.1 g/dL — ABNORMAL LOW (ref 12.0–15.0)
Immature Granulocytes: 0 %
Lymphocytes Relative: 42 %
Lymphs Abs: 1.6 10*3/uL (ref 0.7–4.0)
MCH: 30.5 pg (ref 26.0–34.0)
MCHC: 33.2 g/dL (ref 30.0–36.0)
MCV: 91.7 fL (ref 80.0–100.0)
Monocytes Absolute: 0.3 10*3/uL (ref 0.1–1.0)
Monocytes Relative: 7 %
Neutro Abs: 1.6 10*3/uL — ABNORMAL LOW (ref 1.7–7.7)
Neutrophils Relative %: 43 %
Platelets: 160 10*3/uL (ref 150–400)
RBC: 2.66 MIL/uL — ABNORMAL LOW (ref 3.87–5.11)
RDW: 16.3 % — ABNORMAL HIGH (ref 11.5–15.5)
WBC: 3.8 10*3/uL — ABNORMAL LOW (ref 4.0–10.5)
nRBC: 0 % (ref 0.0–0.2)

## 2019-02-26 LAB — BPAM RBC
Blood Product Expiration Date: 202011132359
Blood Product Expiration Date: 202011142359
ISSUE DATE / TIME: 202010160333
ISSUE DATE / TIME: 202010161410
Unit Type and Rh: 6200
Unit Type and Rh: 6200

## 2019-02-26 MED ORDER — TRAMADOL HCL 50 MG PO TABS: 50.0000 mg | ORAL_TABLET | Freq: Two times a day (BID) | ORAL | 0 refills | Status: DC | PRN

## 2019-02-26 MED ORDER — TRAMADOL HCL 50 MG PO TABS: 50.0000 mg | ORAL_TABLET | Freq: Two times a day (BID) | ORAL | Status: DC | PRN

## 2019-02-26 MED ORDER — PANTOPRAZOLE SODIUM 40 MG PO TBEC: 40.0000 mg | DELAYED_RELEASE_TABLET | Freq: Every day | ORAL | 2 refills | Status: DC

## 2019-02-26 NOTE — Discharge Instructions (Signed)
Pt advised not to use NSAIDS or asa like meds

## 2019-02-26 NOTE — Plan of Care (Signed)
Discharge order received. Patient mental status is at baseline. Vital signs stable . No signs of acute distress. Discharge instructions given. Patient verbalized understanding. No other issues noted at this time.   

## 2019-02-26 NOTE — Progress Notes (Signed)
Katherine Antigua, MD [address], [place], [place], [place], [address] [address], [place], [place], [place], [address] Phone: [phone]  Fax: [phone]   Subjective: No further episodes of bleeding.  Patient states she is feeling much better   Objective: Exam: Vital signs in last 24 hours: Vitals:   02/25/19 1439 02/25/19 1657 02/25/19 2053 02/26/19 0443  BP: (!) 113/50 125/77 (!) 131/53 135/64  Pulse: 70 78 72 71  Resp: 15 16 16 16   Temp: 98.4 F (36.9 C) 98.3 F (36.8 C) 98.9 F (37.2 C) 98.1 F (36.7 C)  TempSrc: Oral Oral Oral Oral  SpO2: 100% 99% 99% 100%  Weight:      Height:       Weight change:   Intake/Output Summary (Last 24 hours) at 02/26/2019 1224 Last data filed at 02/26/2019 0445 Gross per 24 hour  Intake 985.35 ml  Output 1000 ml  Net -14.65 ml    General: No acute distress, AAO x3 Abd: Soft, NT/ND, No HSM Skin: Warm, no rashes Neck: Supple, Trachea midline   Lab Results: Lab Results  Component Value Date   WBC 3.8 (L) 02/26/2019   HGB 8.1 (L) 02/26/2019   HCT 24.4 (L) 02/26/2019   MCV 91.7 02/26/2019   PLT 160 02/26/2019   Micro Results: Recent Results (from the past 240 hour(s))  SARS Coronavirus 2 by RT PCR (hospital order, performed in [place] hospital lab) Nasopharyngeal Nasopharyngeal Swab     Status: None   Collection Time: 02/24/19 10:54 AM   Specimen: Nasopharyngeal Swab  Result Value Ref Range Status   SARS Coronavirus 2 NEGATIVE NEGATIVE Final    Comment: (NOTE) If result is NEGATIVE SARS-CoV-2 target nucleic acids are NOT DETECTED. The SARS-CoV-2 RNA is generally detectable in upper and lower  respiratory specimens during the acute phase of infection. The lowest  concentration of SARS-CoV-2 viral copies this assay can detect is 250  copies / mL. A negative result does not preclude SARS-CoV-2 infection  and should not be used as the sole basis for treatment or other  patient management decisions.  A  negative result may occur with  improper specimen collection / handling, submission of specimen other  than nasopharyngeal swab, presence of viral mutation(s) within the  areas targeted by this assay, and inadequate number of viral copies  (<250 copies / mL). A negative result must be combined with clinical  observations, patient history, and epidemiological information. If result is POSITIVE SARS-CoV-2 target nucleic acids are DETECTED. The SARS-CoV-2 RNA is generally detectable in upper and lower  respiratory specimens dur ing the acute phase of infection.  Positive  results are indicative of active infection with SARS-CoV-2.  Clinical  correlation with patient history and other diagnostic information is  necessary to determine patient infection status.  Positive results do  not rule out bacterial infection or co-infection with other viruses. If result is PRESUMPTIVE POSTIVE SARS-CoV-2 nucleic acids MAY BE PRESENT.   A presumptive positive result was obtained on the submitted specimen  and confirmed on repeat testing.  While 2019 novel coronavirus  (SARS-CoV-2) nucleic acids may be present in the submitted sample  additional confirmatory testing may be necessary for epidemiological  and / or clinical management purposes  to differentiate between  SARS-CoV-2 and other Sarbecovirus currently known to infect humans.  If clinically indicated additional testing with an alternate test  methodology [phone]) is advised. The SARS-CoV-2 RNA is generally  detectable in upper and lower respiratory sp ecimens  during the acute  phase of infection. The expected result is Negative. Fact Sheet for Patients:  StrictlyIdeas.no Fact Sheet for Healthcare Providers: BankingDealers.co.za This test is not yet approved or cleared by the [place] FDA and has been authorized for detection and/or diagnosis of SARS-CoV-2 by FDA under an Emergency Use  Authorization (EUA).  This EUA will remain in effect (meaning this test can be used) for the duration of the COVID-19 declaration under Section 564(b)(1) of the Act, 21 U.S.C. section 360bbb-3(b)(1), unless the authorization is terminated or revoked sooner. Performed at [facility], [address], [place], [place] [address]    Studies/Results: No results found. Medications:  Scheduled Meds: . sodium chloride   Intravenous Once  . amLODipine  10 mg Oral QHS  . cholecalciferol  1,000 Units Oral Daily  . insulin aspart  0-5 Units Subcutaneous QHS  . insulin aspart  0-9 Units Subcutaneous TID WC  . levothyroxine  100 mcg Oral Q0600  . liothyronine  25 mcg Oral BID  . lisinopril  20 mg Oral Daily  . metoprolol succinate  25 mg Oral Daily  . pantoprazole  40 mg Oral Daily   Continuous Infusions: PRN Meds:.diazepam, traMADol   Assessment: Active Problems:   GI bleed    Plan: Continue PPI twice daily for 1 to 2 months Avoid NSAIDs completely, unless low-dose aspirin is prescribed by provider.  This was discussed in detail with patient and family  Follow-up in GI clinic within 4 to 6 weeks Repeat EGD in 2 to 3 months   LOS: 2 days   Katherine Antigua, MD 02/26/2019, 12:24 PM

## 2019-02-26 NOTE — Discharge Summary (Signed)
[place] [facility] [place] NAME: Katherine [name]    MR#:  [id number]  DATE OF BIRTH:  June 10, 1945  DATE OF ADMISSION:  02/24/2019 ADMITTING PHYSICIAN: Otila Back, MD  DATE OF DISCHARGE: 02/26/2019  PRIMARY CARE PHYSICIAN: [name], MD    ADMISSION DIAGNOSIS:  Upper GI bleed [K92.2] Generalized weakness [R53.1] Near syncope [R55]  DISCHARGE DIAGNOSIS:  GI bleed due to PUD with NSAID use Acute anemia due to GI bleed s/p PRBC transfusion  SECONDARY DIAGNOSIS:   Past Medical History:  Diagnosis Date  . Anemia   . Cancer ([place])   . Cystocele   . Depression   . Diabetes mellitus without complication ([place])   . Glaucoma   . Hyperlipidemia   . Hypertension   . Hypothyroidism     HOSPITAL COURSE:  Patient is a 73 year old female with history of chronic atrial fibrillation on anticoagulation with Xarelto,hypertension,diabetes mellitus and hypothyroidism who presented to the emergency room with complaints of generalized weakness and lightheadedness. Being admitted for further evaluation of acute blood loss anemia secondary to GI bleed  1.GI bleed in setting of NSAID use.  On Protonix IV.   -EGD - Salmon-colored mucosa suspicious for short-segment                        Barrett's esophagus.                       - Non-bleeding gastric ulcers with a clean ulcer base                        (Forrest Class III).                       - Normal examined duodenum. 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 Pt has used it before and was ok with it. Asked her to use Tylenol prn -Avoid Nsaids  2.Acute blood loss anemia Secondary to GI bleed. -Transfused 2  unit packed RBC yesterday.   -hgb 8.1 -Feeling well--wants to go home  3.Diabetes mellitus type 2 -Resume home meds  4.Hypothyroidism Resume home dose of Synthroid.   5.Chronic atrial fibrillation Continue rate control with beta-blocker. Ok to resume Xarelto   Per GI--her ulcers were clean based-no active bleeding  ^.DVT prophylaxis;SCDs for now Avoiding heparin products due to GI bleed  Overall stable. D/c home. Pt agreeable  CONSULTS OBTAINED:  Treatment Team:  Virgel Manifold, MD  DRUG ALLERGIES:   Allergies  Allergen Reactions  . Codeine   . Lipitor [Atorvastatin]   . Morphine And Related   . Pravastatin   . Vytorin [Ezetimibe-Simvastatin]     DISCHARGE MEDICATIONS:   Allergies as of 02/26/2019      Reactions   Codeine    Lipitor [atorvastatin]    Morphine And Related    Pravastatin    Vytorin [ezetimibe-simvastatin]       Medication List    TAKE these medications   amLODipine 10 MG tablet Commonly known as: NORVASC Take 10 mg by mouth at bedtime.   cholecalciferol 1000 units tablet Commonly known as: VITAMIN D Take 1,000 Units by mouth daily.   diazepam 5 MG tablet Commonly known as: VALIUM Take 2.5-5 mg by mouth every 8 (eight) hours as needed for anxiety.   furosemide 20 MG tablet Commonly known as: LASIX Take 20 mg by mouth See admin instructions.  Take 1 tablet (20mg ) by mouth daily - take 1 additional tablet (20mg ) by mouth daily if needed for swelling   glimepiride 2 MG tablet Commonly known as: AMARYL Take 2 mg by mouth daily with breakfast.   liothyronine 25 MCG tablet Commonly known as: CYTOMEL Take 25 mcg by mouth 2 (two) times daily.   lisinopril 20 MG tablet Commonly known as: ZESTRIL Take 20 mg by mouth daily.   metFORMIN 1000 MG tablet Commonly known as: GLUCOPHAGE Take 1,000 mg by mouth 2 (two) times daily with a meal.   metoprolol succinate 25 MG 24 hr tablet Commonly known as: TOPROL-XL Take 25 mg by mouth daily.   Multi-Vitamins Tabs Take 1 tablet by mouth daily.   pantoprazole 40 MG tablet Commonly known as: PROTONIX Take 1 tablet (40 mg total) by mouth daily. Start taking on: February 27, 2019   pioglitazone 30 MG tablet Commonly known as: ACTOS Take 30 mg by  mouth at bedtime.   rivaroxaban 20 MG Tabs tablet Commonly known as: XARELTO Take 20 mg by mouth daily with supper.   sitaGLIPtin 100 MG tablet Commonly known as: JANUVIA Take 100 mg by mouth daily.   Synthroid 100 MCG tablet Generic drug: levothyroxine Take 100 mcg by mouth daily.   traMADol 50 MG tablet Commonly known as: ULTRAM Take 1 tablet (50 mg total) by mouth every 12 (twelve) hours as needed for severe pain.       If you experience worsening of your admission symptoms, develop shortness of breath, life threatening emergency, suicidal or homicidal thoughts you must seek medical attention immediately by calling 911 or calling your MD immediately  if symptoms less severe.  You Must read complete instructions/literature along with all the possible adverse reactions/side effects for all the Medicines you take and that have been prescribed to you. Take any new Medicines after you have completely understood and accept all the possible adverse reactions/side effects.   Please note  You were cared for by a hospitalist during your hospital stay. If you have any questions about your discharge medications or the care you received while you were in the hospital after you are discharged, you can call the unit and asked to speak with the hospitalist on call if the hospitalist that took care of you is not available. Once you are discharged, your primary care physician will handle any further medical issues. Please note that NO REFILLS for any discharge medications will be authorized once you are discharged, as it is imperative that you return to your primary care physician (or establish a relationship with a primary care physician if you do not have one) for your aftercare needs so that they can reassess your need for medications and monitor your lab values. Today   SUBJECTIVE   Doing well. Tolerating CLD  VITAL SIGNS:  Blood pressure 135/64, pulse 71, temperature 98.1 F (36.7 C),  temperature source Oral, resp. rate 16, height 5\' 7"  (1.702 m), weight 99 kg, SpO2 100 %.  I/O:    Intake/Output Summary (Last 24 hours) at 02/26/2019 0918 Last data filed at 02/26/2019 0445 Gross per 24 hour  Intake 985.35 ml  Output 1000 ml  Net -14.65 ml    PHYSICAL EXAMINATION:  GENERAL:  73 y.o.-year-old patient lying in the bed with no acute distress.  EYES: Pupils equal, round, reactive to light and accommodation. No scleral icterus. Extraocular muscles intact.  HEENT: Head atraumatic, normocephalic. Oropharynx and nasopharynx clear.  NECK:  Supple, no jugular venous  distention. No thyroid enlargement, no tenderness.  LUNGS: Normal breath sounds bilaterally, no wheezing, rales,rhonchi or crepitation. No use of accessory muscles of respiration.  CARDIOVASCULAR: S1, S2 normal. No murmurs, rubs, or gallops.  ABDOMEN: Soft, non-tender, non-distended. Bowel sounds present. No organomegaly or mass.  EXTREMITIES: No pedal edema, cyanosis, or clubbing.  NEUROLOGIC: Cranial nerves II through XII are intact. Muscle strength 5/5 in all extremities. Sensation intact. Gait not checked.  PSYCHIATRIC: The patient is alert and oriented x 3.  SKIN: No obvious rash, lesion, or ulcer.   DATA REVIEW:   CBC  Recent Labs  Lab 02/26/19 0438  WBC 3.8*  HGB 8.1*  HCT 24.4*  PLT 160    Chemistries  Recent Labs  Lab 02/24/19 0844 02/25/19 0221 02/26/19 0438  NA 137 142 143  K 4.1 4.0 4.0  CL 105 114* 113*  CO2 21* 21* 23  GLUCOSE 272* 121* 147*  BUN 72* 40* 17  CREATININE 0.94 0.72 0.83  CALCIUM 8.8* 8.7* 9.0  MG  --  1.9  --   AST 15  --   --   ALT 15  --   --   ALKPHOS 31*  --   --   BILITOT 0.5  --   --     Microbiology Results   Recent Results (from the past 240 hour(s))  SARS Coronavirus 2 by RT PCR (hospital order, performed in [place] hospital lab) Nasopharyngeal Nasopharyngeal Swab     Status: None   Collection Time: 02/24/19 10:54 AM   Specimen: Nasopharyngeal  Swab  Result Value Ref Range Status   SARS Coronavirus 2 NEGATIVE NEGATIVE Final    Comment: (NOTE) If result is NEGATIVE SARS-CoV-2 target nucleic acids are NOT DETECTED. The SARS-CoV-2 RNA is generally detectable in upper and lower  respiratory specimens during the acute phase of infection. The lowest  concentration of SARS-CoV-2 viral copies this assay can detect is 250  copies / mL. A negative result does not preclude SARS-CoV-2 infection  and should not be used as the sole basis for treatment or other  patient management decisions.  A negative result may occur with  improper specimen collection / handling, submission of specimen other  than nasopharyngeal swab, presence of viral mutation(s) within the  areas targeted by this assay, and inadequate number of viral copies  (<250 copies / mL). A negative result must be combined with clinical  observations, patient history, and epidemiological information. If result is POSITIVE SARS-CoV-2 target nucleic acids are DETECTED. The SARS-CoV-2 RNA is generally detectable in upper and lower  respiratory specimens dur ing the acute phase of infection.  Positive  results are indicative of active infection with SARS-CoV-2.  Clinical  correlation with patient history and other diagnostic information is  necessary to determine patient infection status.  Positive results do  not rule out bacterial infection or co-infection with other viruses. If result is PRESUMPTIVE POSTIVE SARS-CoV-2 nucleic acids MAY BE PRESENT.   A presumptive positive result was obtained on the submitted specimen  and confirmed on repeat testing.  While 2019 novel coronavirus  (SARS-CoV-2) nucleic acids may be present in the submitted sample  additional confirmatory testing may be necessary for epidemiological  and / or clinical management purposes  to differentiate between  SARS-CoV-2 and other Sarbecovirus currently known to infect humans.  If clinically indicated  additional testing with an alternate test  methodology [phone]) is advised. The SARS-CoV-2 RNA is generally  detectable in upper and lower respiratory sp  ecimens during the acute  phase of infection. The expected result is Negative. Fact Sheet for Patients:  StrictlyIdeas.no Fact Sheet for Healthcare Providers: BankingDealers.co.za This test is not yet approved or cleared by the [place] FDA and has been authorized for detection and/or diagnosis of SARS-CoV-2 by FDA under an Emergency Use Authorization (EUA).  This EUA will remain in effect (meaning this test can be used) for the duration of the COVID-19 declaration under Section 564(b)(1) of the Act, 21 U.S.C. section 360bbb-3(b)(1), unless the authorization is terminated or revoked sooner. Performed at [facility], [address], [place], [place] [address]     RADIOLOGY:  No results found.   CODE STATUS:     Code Status Orders  (From admission, onward)         Start     Ordered   02/24/19 1118  Do not attempt resuscitation (DNR)  Continuous    Question Answer Comment  In the event of cardiac or respiratory ARREST Do not call a "code blue"   In the event of cardiac or respiratory ARREST Do not perform Intubation, CPR, defibrillation or ACLS   In the event of cardiac or respiratory ARREST Use medication by any route, position, wound care, and other measures to relive pain and suffering. May use oxygen, suction and manual treatment of airway obstruction as needed for comfort.      02/24/19 1118        Code Status History    Date Active Date Inactive Code Status Order ID Comments User Context   06/25/2015 2235 06/27/2015 1830 Full Code [id number]  Nicholes Mango, MD Inpatient   Advance Care Planning Activity    Advance Directive Documentation     Most Recent Value  Type of Advance Directive  Healthcare Power of [facility] ordered]  Pre-existing out of  facility DNR order (yellow form or pink MOST form)  -  "MOST" Form in Place?  -      TOTAL TIME TAKING CARE OF THIS PATIENT: *40* minutes.    Fritzi Mandes M.D on 02/26/2019 at 9:18 AM  Between 7am to 6pm - Pager - [phone] After 6pm go to [email or web address] - password [email or web address] [facility]  Office  [phone]  CC: Primary care physician; [name], MD

## 2019-02-28 ENCOUNTER — Encounter: Payer: Self-pay | Admitting: Gastroenterology

## 2019-03-13 ENCOUNTER — Emergency Department: Payer: Medicare Other

## 2019-03-13 ENCOUNTER — Encounter: Payer: Self-pay | Admitting: *Deleted

## 2019-03-13 ENCOUNTER — Inpatient Hospital Stay
Admission: EM | Admit: 2019-03-13 | Discharge: 2019-03-14 | DRG: 683 | Disposition: A | Discharge status: Home / Self Care | Payer: Medicare Other | Attending: Internal Medicine | Admitting: Internal Medicine

## 2019-03-13 ENCOUNTER — Other Ambulatory Visit: Payer: Self-pay

## 2019-03-13 DIAGNOSIS — Z888 Allergy status to other drugs, medicaments and biological substances status: Secondary | ICD-10-CM

## 2019-03-13 DIAGNOSIS — E039 Hypothyroidism, unspecified: Secondary | ICD-10-CM | POA: Diagnosis present

## 2019-03-13 DIAGNOSIS — Z7984 Long term (current) use of oral hypoglycemic drugs: Secondary | ICD-10-CM

## 2019-03-13 DIAGNOSIS — E785 Hyperlipidemia, unspecified: Secondary | ICD-10-CM | POA: Diagnosis present

## 2019-03-13 DIAGNOSIS — K219 Gastro-esophageal reflux disease without esophagitis: Secondary | ICD-10-CM | POA: Diagnosis present

## 2019-03-13 DIAGNOSIS — Z9049 Acquired absence of other specified parts of digestive tract: Secondary | ICD-10-CM

## 2019-03-13 DIAGNOSIS — R630 Anorexia: Secondary | ICD-10-CM | POA: Diagnosis present

## 2019-03-13 DIAGNOSIS — R55 Syncope and collapse: Secondary | ICD-10-CM | POA: Diagnosis not present

## 2019-03-13 DIAGNOSIS — I480 Paroxysmal atrial fibrillation: Secondary | ICD-10-CM | POA: Diagnosis present

## 2019-03-13 DIAGNOSIS — E860 Dehydration: Secondary | ICD-10-CM | POA: Diagnosis present

## 2019-03-13 DIAGNOSIS — I959 Hypotension, unspecified: Secondary | ICD-10-CM | POA: Diagnosis not present

## 2019-03-13 DIAGNOSIS — R197 Diarrhea, unspecified: Secondary | ICD-10-CM

## 2019-03-13 DIAGNOSIS — Z833 Family history of diabetes mellitus: Secondary | ICD-10-CM

## 2019-03-13 DIAGNOSIS — E871 Hypo-osmolality and hyponatremia: Secondary | ICD-10-CM | POA: Diagnosis present

## 2019-03-13 DIAGNOSIS — Z79890 Hormone replacement therapy (postmenopausal): Secondary | ICD-10-CM

## 2019-03-13 DIAGNOSIS — Z87891 Personal history of nicotine dependence: Secondary | ICD-10-CM

## 2019-03-13 DIAGNOSIS — I700 Atherosclerosis of aorta: Secondary | ICD-10-CM | POA: Diagnosis present

## 2019-03-13 DIAGNOSIS — I6522 Occlusion and stenosis of left carotid artery: Secondary | ICD-10-CM | POA: Diagnosis present

## 2019-03-13 DIAGNOSIS — K2270 Barrett's esophagus without dysplasia: Secondary | ICD-10-CM | POA: Diagnosis present

## 2019-03-13 DIAGNOSIS — D649 Anemia, unspecified: Secondary | ICD-10-CM | POA: Diagnosis present

## 2019-03-13 DIAGNOSIS — Z79891 Long term (current) use of opiate analgesic: Secondary | ICD-10-CM

## 2019-03-13 DIAGNOSIS — F329 Major depressive disorder, single episode, unspecified: Secondary | ICD-10-CM | POA: Diagnosis present

## 2019-03-13 DIAGNOSIS — I10 Essential (primary) hypertension: Secondary | ICD-10-CM | POA: Diagnosis present

## 2019-03-13 DIAGNOSIS — N179 Acute kidney failure, unspecified: Secondary | ICD-10-CM | POA: Diagnosis not present

## 2019-03-13 DIAGNOSIS — Z885 Allergy status to narcotic agent status: Secondary | ICD-10-CM

## 2019-03-13 DIAGNOSIS — E119 Type 2 diabetes mellitus without complications: Secondary | ICD-10-CM | POA: Diagnosis present

## 2019-03-13 DIAGNOSIS — Z7901 Long term (current) use of anticoagulants: Secondary | ICD-10-CM

## 2019-03-13 DIAGNOSIS — Z79899 Other long term (current) drug therapy: Secondary | ICD-10-CM

## 2019-03-13 DIAGNOSIS — Z20828 Contact with and (suspected) exposure to other viral communicable diseases: Secondary | ICD-10-CM | POA: Diagnosis present

## 2019-03-13 DIAGNOSIS — H409 Unspecified glaucoma: Secondary | ICD-10-CM | POA: Diagnosis present

## 2019-03-13 DIAGNOSIS — Z8711 Personal history of peptic ulcer disease: Secondary | ICD-10-CM

## 2019-03-13 LAB — COMPREHENSIVE METABOLIC PANEL
ALT: 18 U/L (ref 0–44)
AST: 28 U/L (ref 15–41)
Albumin: 3.9 g/dL (ref 3.5–5.0)
Alkaline Phosphatase: 46 U/L (ref 38–126)
Anion gap: 14 (ref 5–15)
BUN: 56 mg/dL — ABNORMAL HIGH (ref 8–23)
CO2: 21 mmol/L — ABNORMAL LOW (ref 22–32)
Calcium: 9.8 mg/dL (ref 8.9–10.3)
Chloride: 99 mmol/L (ref 98–111)
Creatinine, Ser: 2.13 mg/dL — ABNORMAL HIGH (ref 0.44–1.00)
GFR calc Af Amer: 26 mL/min — ABNORMAL LOW (ref >60)
GFR calc non Af Amer: 23 mL/min — ABNORMAL LOW (ref >60)
Glucose, Bld: 228 mg/dL — ABNORMAL HIGH (ref 70–99)
Potassium: 4.3 mmol/L (ref 3.5–5.1)
Sodium: 134 mmol/L — ABNORMAL LOW (ref 135–145)
Total Bilirubin: 0.6 mg/dL (ref 0.3–1.2)
Total Protein: 6.9 g/dL (ref 6.5–8.1)

## 2019-03-13 LAB — LIPASE, BLOOD: Lipase: 37 U/L (ref 11–51)

## 2019-03-13 LAB — CBC WITH DIFFERENTIAL/PLATELET
Abs Immature Granulocytes: 0.02 10*3/uL (ref 0.00–0.07)
Basophils Absolute: 0.1 10*3/uL (ref 0.0–0.1)
Basophils Relative: 1 %
Eosinophils Absolute: 0.2 10*3/uL (ref 0.0–0.5)
Eosinophils Relative: 2 %
HCT: 32.9 % — ABNORMAL LOW (ref 36.0–46.0)
Hemoglobin: 10.5 g/dL — ABNORMAL LOW (ref 12.0–15.0)
Immature Granulocytes: 0 %
Lymphocytes Relative: 18 %
Lymphs Abs: 1.2 10*3/uL (ref 0.7–4.0)
MCH: 28.8 pg (ref 26.0–34.0)
MCHC: 31.9 g/dL (ref 30.0–36.0)
MCV: 90.1 fL (ref 80.0–100.0)
Monocytes Absolute: 0.4 10*3/uL (ref 0.1–1.0)
Monocytes Relative: 5 %
Neutro Abs: 4.9 10*3/uL (ref 1.7–7.7)
Neutrophils Relative %: 74 %
Platelets: 396 10*3/uL (ref 150–400)
RBC: 3.65 MIL/uL — ABNORMAL LOW (ref 3.87–5.11)
RDW: 15.4 % (ref 11.5–15.5)
WBC: 6.7 10*3/uL (ref 4.0–10.5)
nRBC: 0 % (ref 0.0–0.2)

## 2019-03-13 LAB — TSH: TSH: 43.053 u[IU]/mL — ABNORMAL HIGH (ref 0.350–4.500)

## 2019-03-13 IMAGING — CT CT HEAD W/O CM
3 series · 16 of 47 positions shown, 19 images · non-contrast
Comparison: None.

CLINICAL DATA: Syncopal episode.

EXAM:
CT HEAD WITHOUT CONTRAST
TECHNIQUE: Contiguous axial images were obtained from the base of the skull
through the vertex without intravenous contrast.

[Series 3: head wo · axial · 0.41mm/px · z∈[+413,+538]mm · 10 of 30 slices shown, 13 images]
[im 3/30  brain]
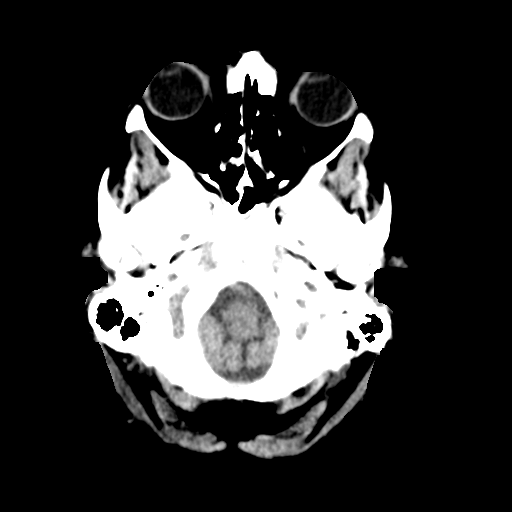
[im 3/30  bone]
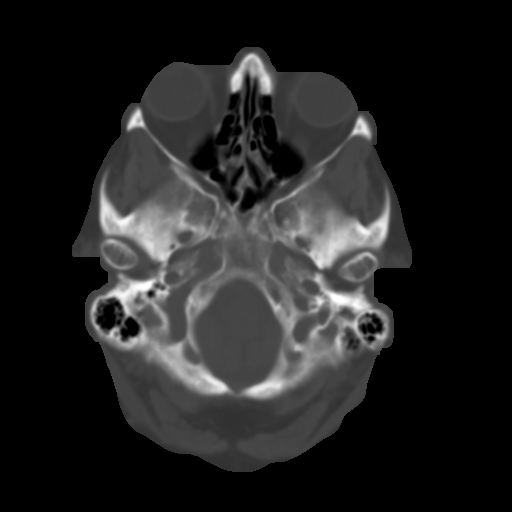
[im 6/30  brain]
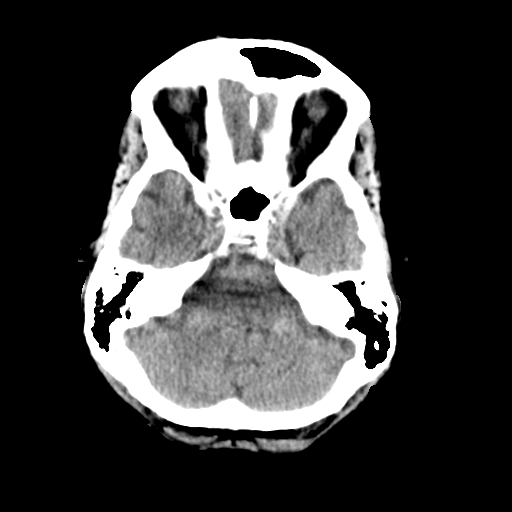
[im 9/30  brain]
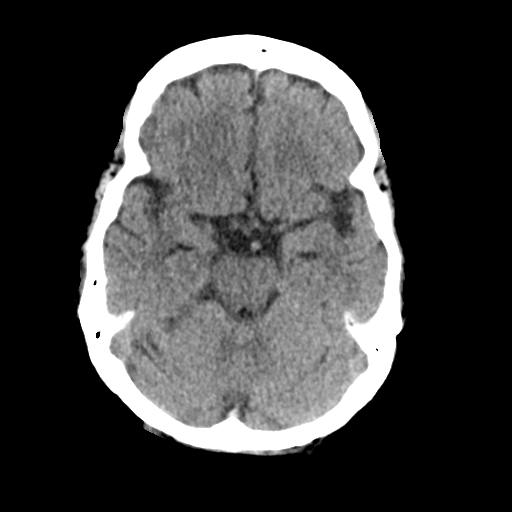
[im 11/30  brain]
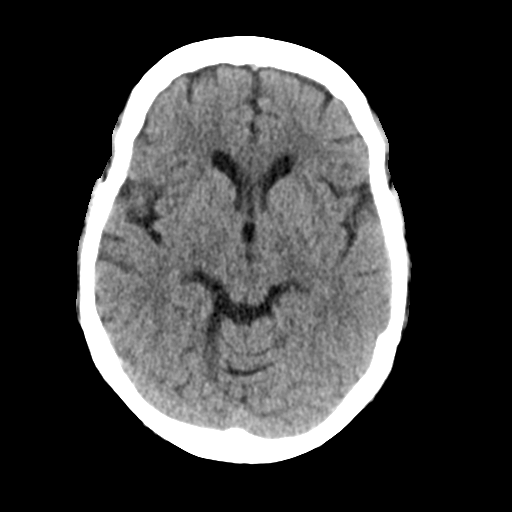
[im 14/30  brain]
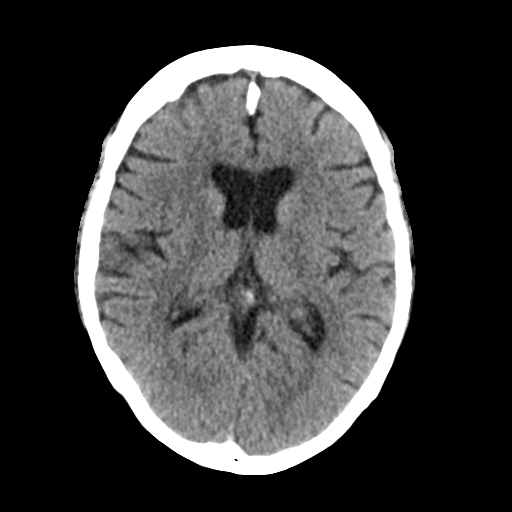
[im 14/30  bone]
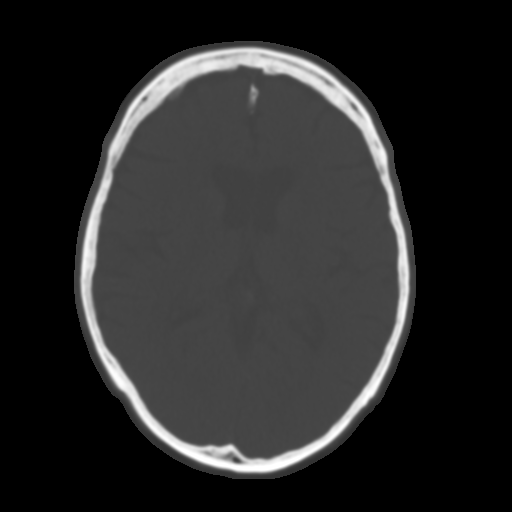
[im 17/30  brain]
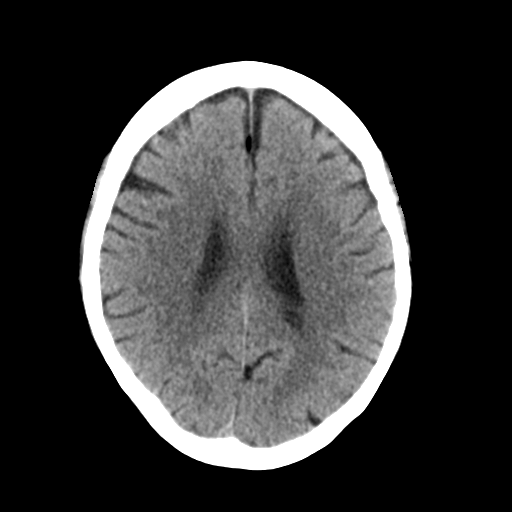
[im 20/30  brain]
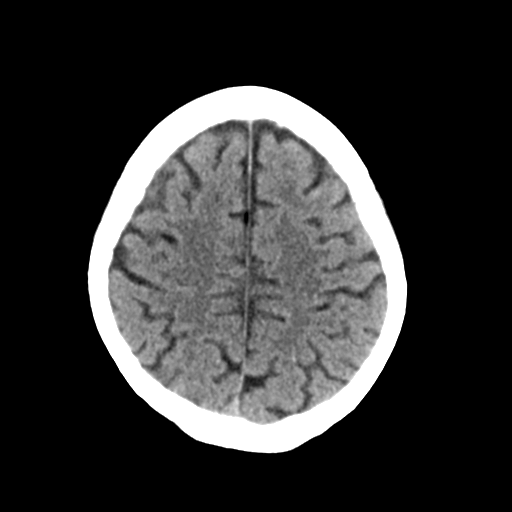
[im 23/30  brain]
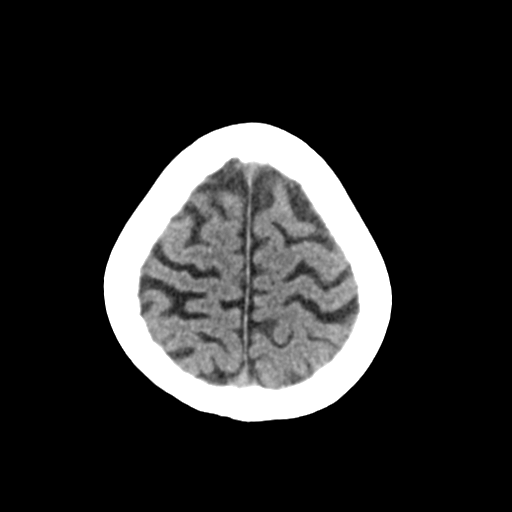
[im 25/30  brain]
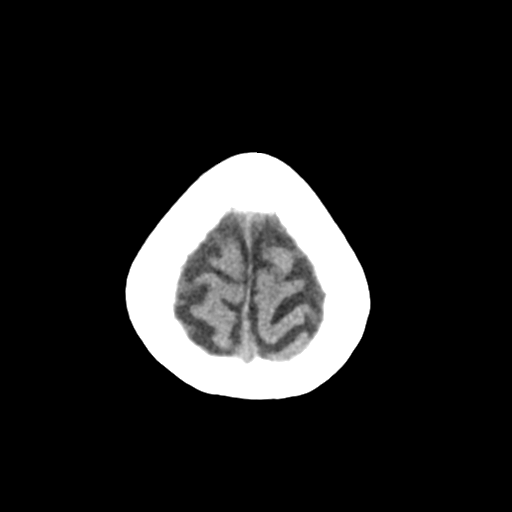
[im 25/30  bone]
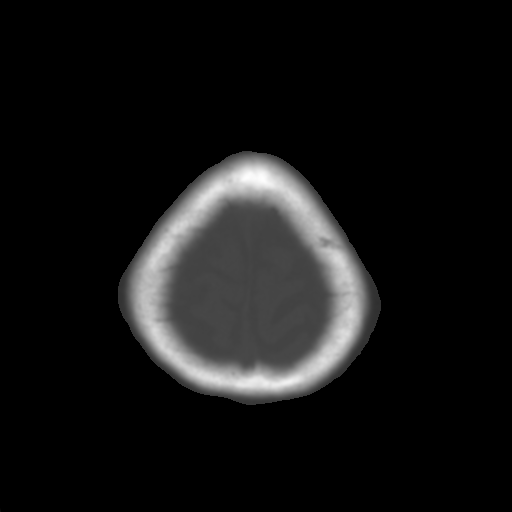
[im 28/30  brain]
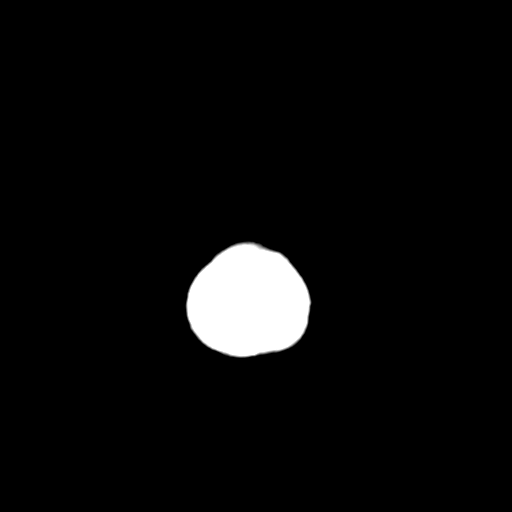

[Series 4: coronal soft tissue · coronal · 0.28mm/px · 3 of 62 slices shown]
[im 21/62  brain]
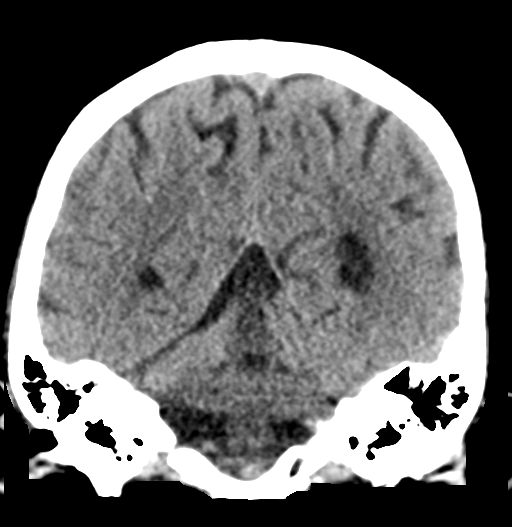
[im 28/62  brain]
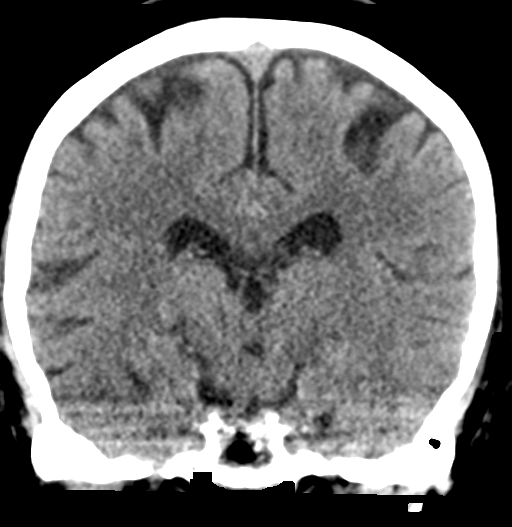
[im 34/62  brain]
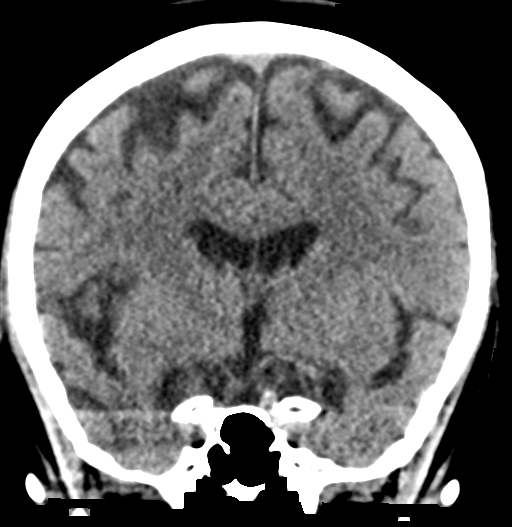

[Series 5: sagittal soft tissue · sagittal · 0.28mm/px · 3 of 48 slices shown]
[im 16/48  brain]
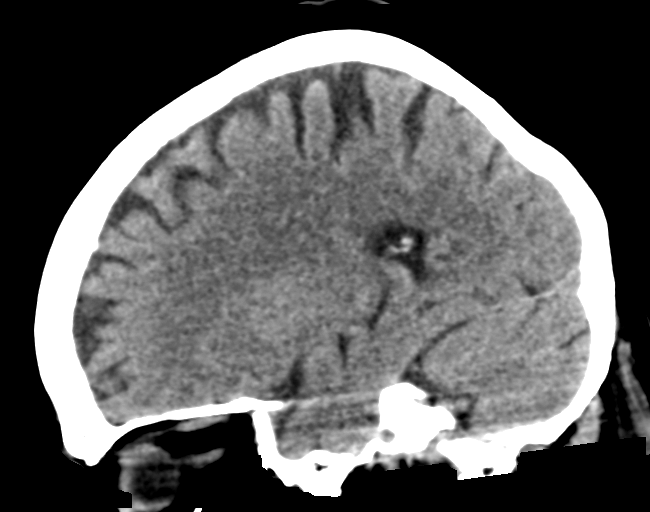
[im 24/48  brain]
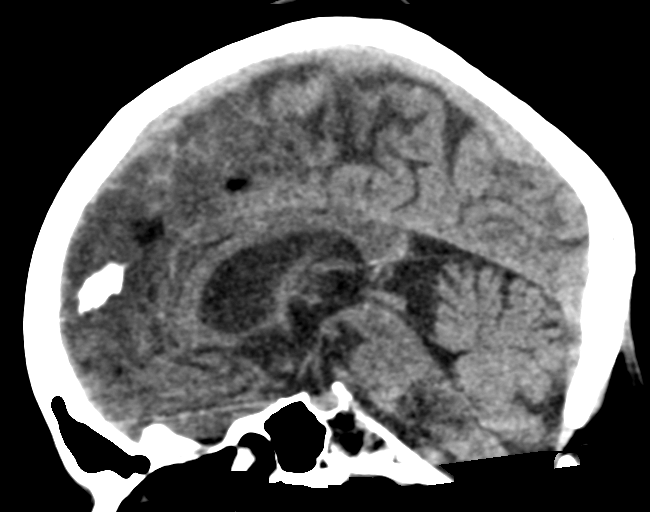
[im 32/48  brain]
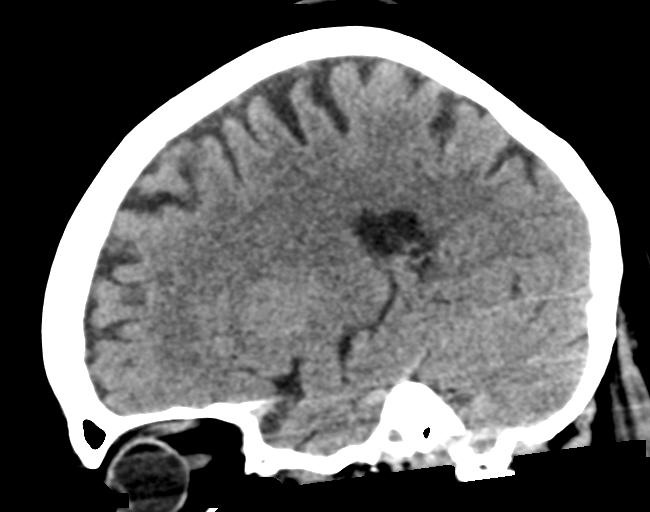

[16 of 47 positions shown; findings below may reference images not displayed]

FINDINGS: Brain: No evidence of acute infarction, hemorrhage, hydrocephalus,
extra-axial collection, or mass lesion/mass effect. Mild diffuse
cerebral atrophy noted.

Vascular:  No hyperdense vessel or other acute findings.

Skull: No evidence of fracture or other significant bone
abnormality.

Sinuses/Orbits:  No acute findings.

Other: None.
IMPRESSION: No acute findings. Mild cerebral atrophy.

## 2019-03-13 IMAGING — CT CT ABD-PELV W/O CM
2 of 4 series · 16 of 46 positions shown, 18 images · non-contrast
Comparison: [DATE]

CLINICAL DATA: Lower abdominal pain, cramping, nausea without
vomiting, syncopal episode, diarrhea starting today, diagnosed with
bleeding ulcers on [DATE], transfused 2 units of blood at that
time, on blood thinners for atrial fibrillation, history
hypertension, diabetes mellitus, former smoker

EXAM:
CT ABDOMEN AND PELVIS WITHOUT CONTRAST
TECHNIQUE: Multidetector CT imaging of the abdomen and pelvis was performed
following the standard protocol without IV contrast. Sagittal and
coronal MPR images reconstructed from axial data set. Patient drank
dilute oral contrast for exam

[Series 2: routine abd/pel wo · axial · 0.74mm/px · z∈[-314,+146]mm · 13 of 102 slices shown, 15 images]
[im 5/102  soft-tissue]
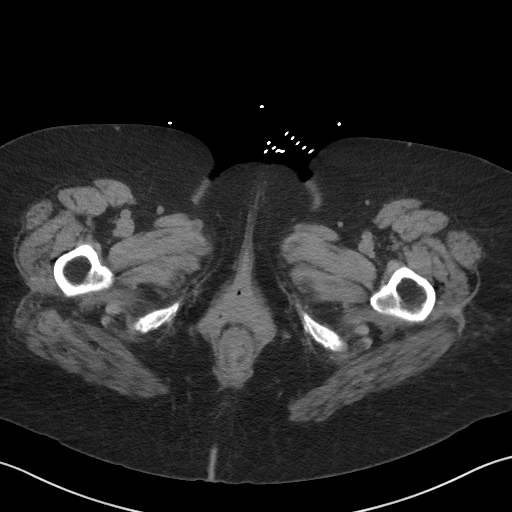
[im 5/102  bone]
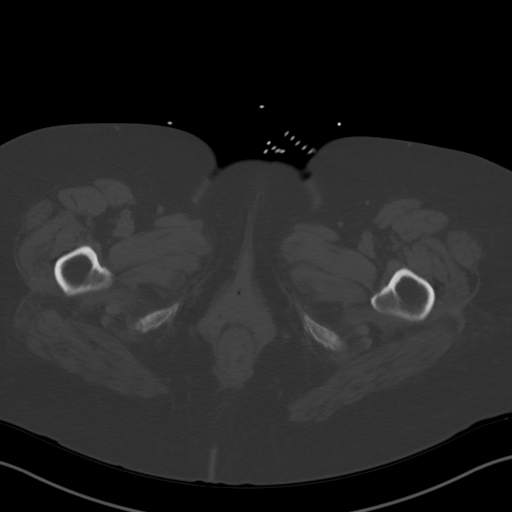
[im 14/102  soft-tissue]
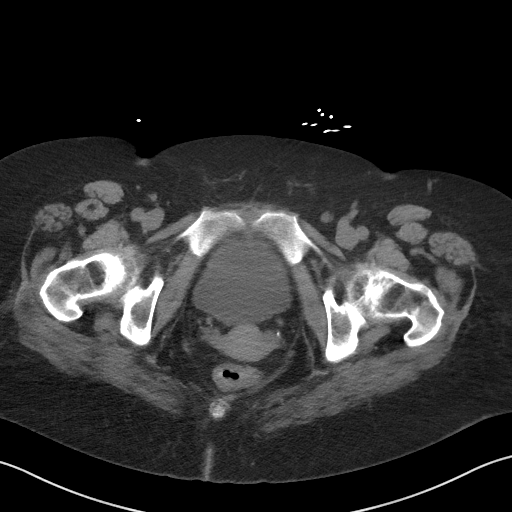
[im 23/102  soft-tissue]
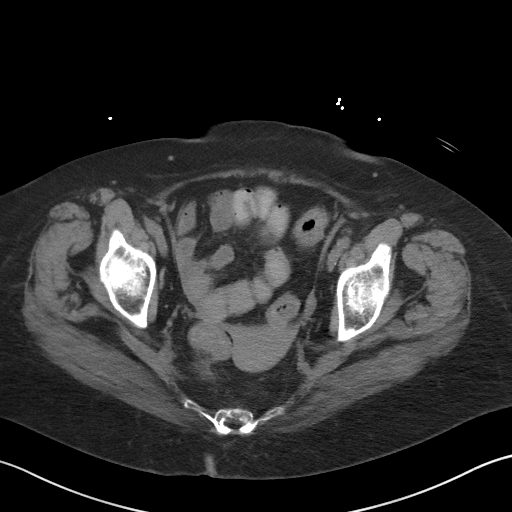
[im 28/102  soft-tissue]
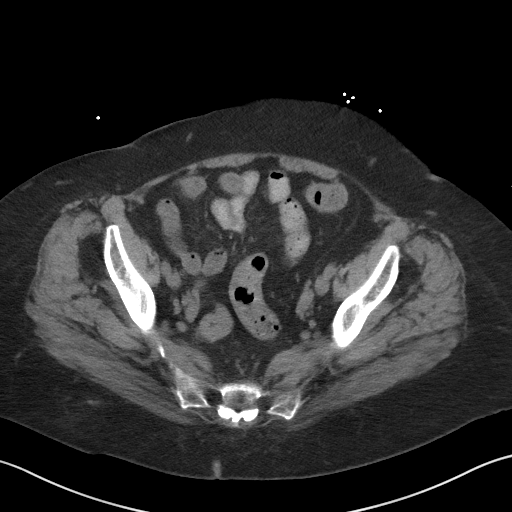
[im 37/102  soft-tissue]
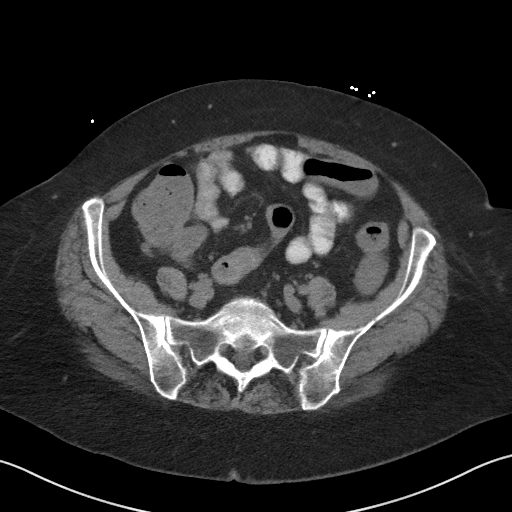
[im 42/102  soft-tissue]
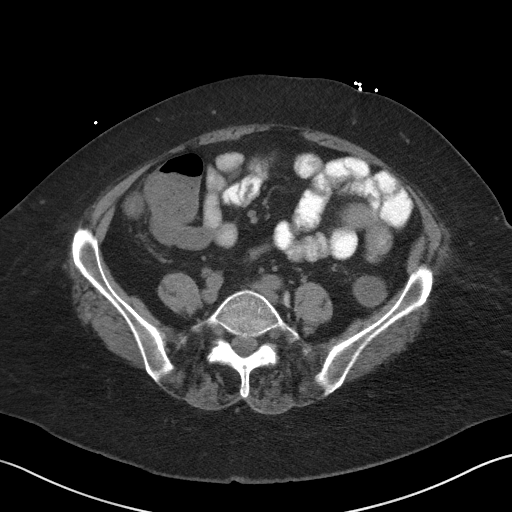
[im 51/102  soft-tissue]
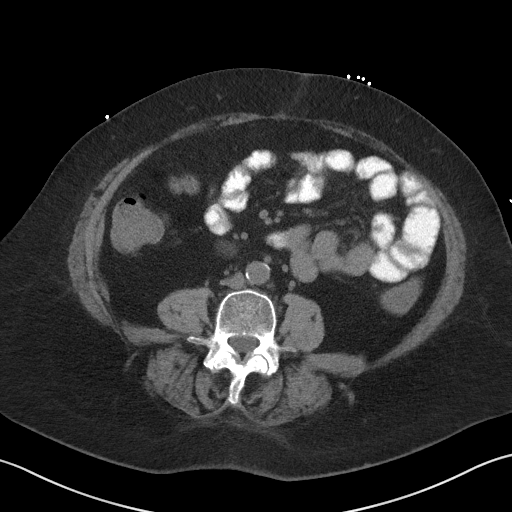
[im 60/102  soft-tissue]
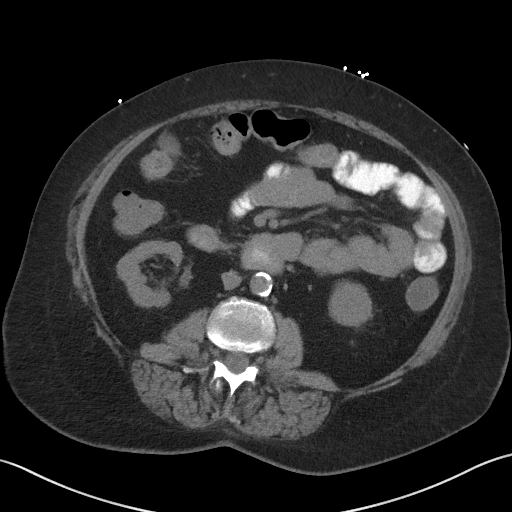
[im 65/102  soft-tissue]
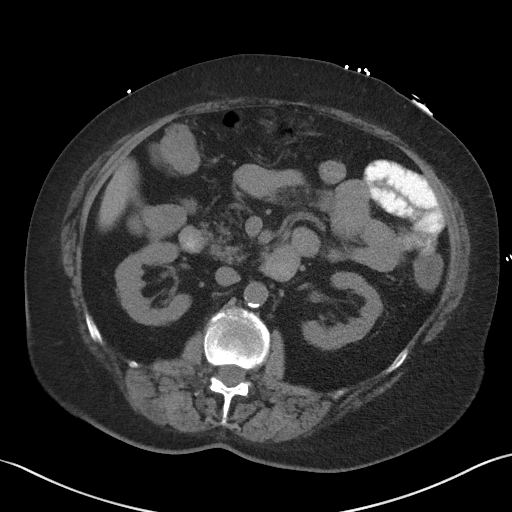
[im 65/102  bone]
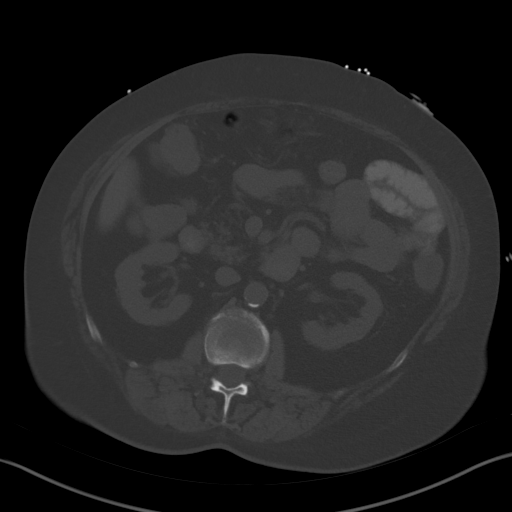
[im 74/102  soft-tissue]
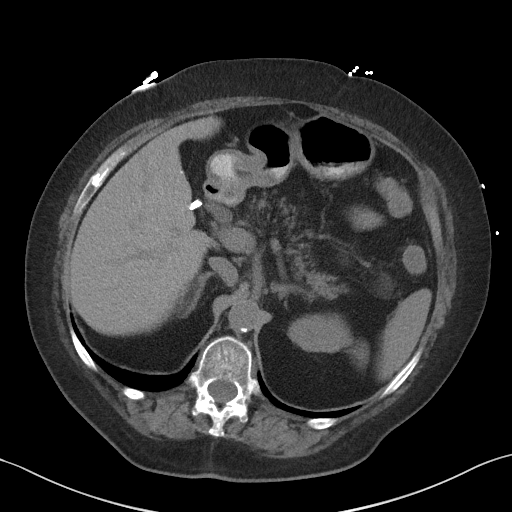
[im 79/102  soft-tissue]
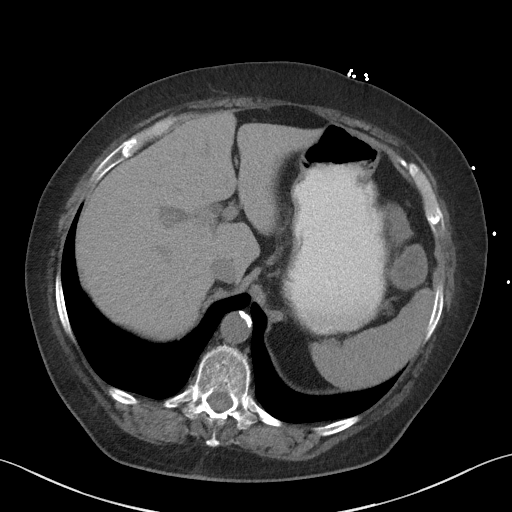
[im 88/102  soft-tissue]
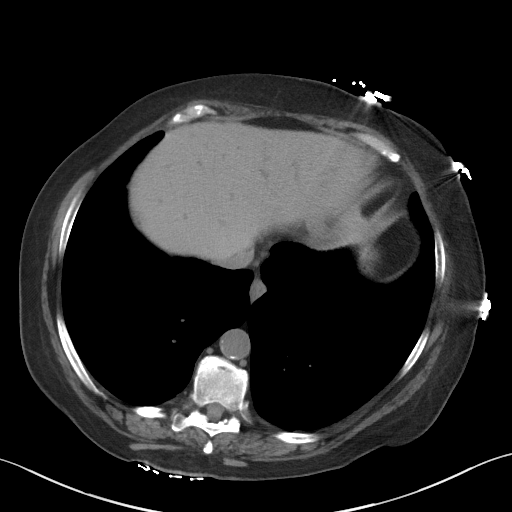
[im 97/102  soft-tissue]
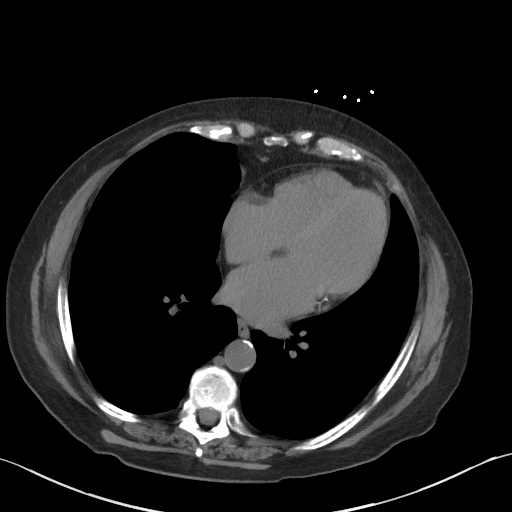

[Series 5: coronal st · coronal · 0.78mm/px · 3 of 105 slices shown]
[im 35/105  soft-tissue]
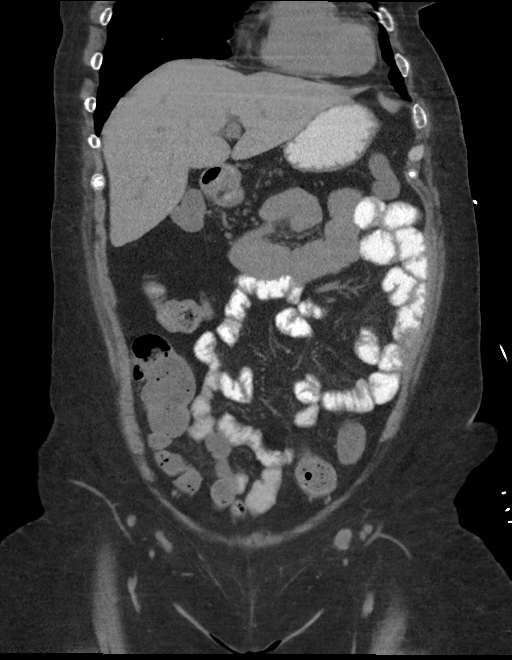
[im 47/105  soft-tissue]
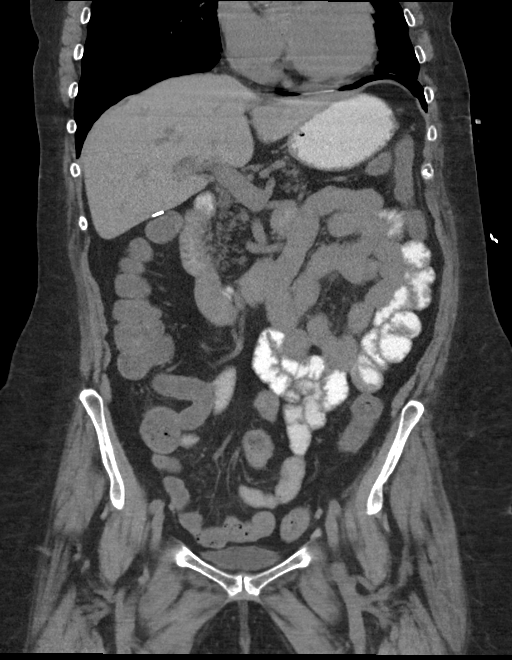
[im 58/105  soft-tissue]
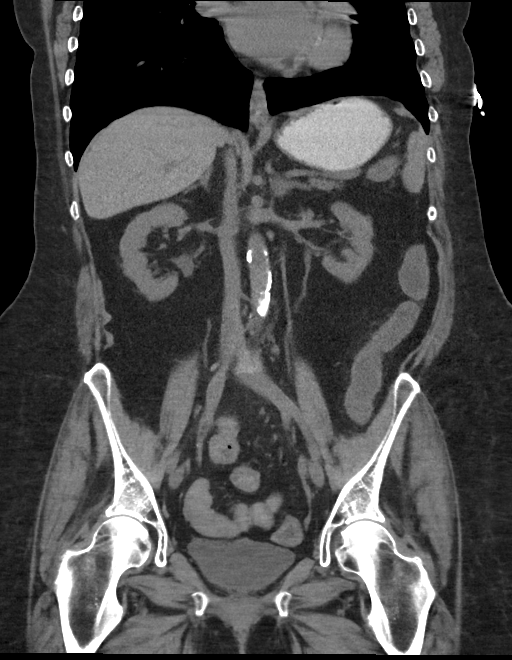

[16 of 46 positions shown; findings below may reference images not displayed]

FINDINGS: Lower chest: Minimal linear subsegmental atelectasis in lingula.
Lung bases otherwise clear.

Hepatobiliary: Gallbladder surgically absent. Upper normal hepatic
attenuation without focal mass lesion. Minimal central intrahepatic
biliary dilatation, potentially physiologic.

Pancreas: Fatty replacement of pancreas. Single parenchymal
calcification at pancreatic head adjacent to duodenum. No distal CBD
calcification or pancreatic mass.

Spleen: Normal appearance

Adrenals/Urinary Tract: Adrenal glands, kidneys, ureters, and
bladder normal appearance

Stomach/Bowel: Normal appendix. Stomach and bowel loops unremarkable
for technique.

Vascular/Lymphatic: Atherosclerotic calcifications of aorta and
iliac arteries without aneurysm. Coronary arterial calcifications
noted. No adenopathy.

Reproductive: Unremarkable uterus and ovaries

Other: No free air or free fluid. No hernia or acute inflammatory
process.

Musculoskeletal: Osseous demineralization.
IMPRESSION: No acute intra-abdominal or intrapelvic abnormalities.

Aortic Atherosclerosis ([9N]-[9N]).

## 2019-03-13 MED ORDER — ACETAMINOPHEN 325 MG PO TABS: 650.0000 mg | ORAL_TABLET | Freq: Four times a day (QID) | ORAL | Status: DC | PRN

## 2019-03-13 MED ORDER — SODIUM CHLORIDE 0.9% FLUSH
3.0000 mL | Freq: Two times a day (BID) | INTRAVENOUS | Status: DC
  Administered 2019-03-14: 3 mL via INTRAVENOUS

## 2019-03-13 MED ORDER — IOHEXOL 240 MG/ML SOLN: 25.0000 mL | INTRAMUSCULAR | Status: DC

## 2019-03-13 MED ORDER — LINAGLIPTIN 5 MG PO TABS
5.0000 mg | ORAL_TABLET | Freq: Every day | ORAL | Status: DC
  Administered 2019-03-14 (×2): 5 mg via ORAL
  Filled 2019-03-13 (×2): qty 1

## 2019-03-13 MED ORDER — IOHEXOL 9 MG/ML PO SOLN
500.0000 mL | ORAL | Status: AC
  Administered 2019-03-13 (×2): 500 mL via ORAL

## 2019-03-13 MED ORDER — ACETAMINOPHEN 650 MG RE SUPP
650.0000 mg | Freq: Four times a day (QID) | RECTAL | Status: DC | PRN
  Filled 2019-03-13: qty 1

## 2019-03-13 MED ORDER — TRAZODONE HCL 50 MG PO TABS
25.0000 mg | ORAL_TABLET | Freq: Every evening | ORAL | Status: DC | PRN
  Administered 2019-03-14: 25 mg via ORAL
  Filled 2019-03-13 (×2): qty 0.5

## 2019-03-13 MED ORDER — ALUM & MAG HYDROXIDE-SIMETH 200-200-20 MG/5ML PO SUSP: 30.0000 mL | Freq: Four times a day (QID) | ORAL | Status: DC | PRN

## 2019-03-13 MED ORDER — SODIUM CHLORIDE 0.9 % IV SOLN
INTRAVENOUS | Status: DC
  Administered 2019-03-14: via INTRAVENOUS

## 2019-03-13 MED ORDER — MAGNESIUM HYDROXIDE 400 MG/5ML PO SUSP: 30.0000 mL | Freq: Every day | ORAL | Status: DC | PRN

## 2019-03-13 MED ORDER — AMLODIPINE BESYLATE 5 MG PO TABS
10.0000 mg | ORAL_TABLET | Freq: Every day | ORAL | Status: DC
  Filled 2019-03-13 (×2): qty 2

## 2019-03-13 MED ORDER — RIVAROXABAN 20 MG PO TABS: 20.0000 mg | ORAL_TABLET | Freq: Every day | ORAL | Status: DC

## 2019-03-13 MED ORDER — GLIMEPIRIDE 2 MG PO TABS: 2.0000 mg | ORAL_TABLET | Freq: Every day | ORAL | Status: DC

## 2019-03-13 MED ORDER — ONDANSETRON HCL 4 MG PO TABS
4.0000 mg | ORAL_TABLET | Freq: Four times a day (QID) | ORAL | Status: DC | PRN
  Filled 2019-03-13: qty 1

## 2019-03-13 MED ORDER — SODIUM CHLORIDE 0.9 % IV BOLUS
500.0000 mL | Freq: Once | INTRAVENOUS | Status: AC
  Administered 2019-03-13: 20:00:00 500 mL via INTRAVENOUS

## 2019-03-13 MED ORDER — TRAMADOL HCL 50 MG PO TABS: 50.0000 mg | ORAL_TABLET | Freq: Two times a day (BID) | ORAL | Status: DC | PRN

## 2019-03-13 MED ORDER — ONDANSETRON HCL 4 MG/2ML IJ SOLN: 4.0000 mg | Freq: Four times a day (QID) | INTRAMUSCULAR | Status: DC | PRN

## 2019-03-13 MED ORDER — SODIUM CHLORIDE 0.9 % IV BOLUS
1000.0000 mL | Freq: Once | INTRAVENOUS | Status: AC
  Administered 2019-03-13: 17:00:00 1000 mL via INTRAVENOUS

## 2019-03-13 MED ORDER — PANTOPRAZOLE SODIUM 40 MG PO TBEC
40.0000 mg | DELAYED_RELEASE_TABLET | Freq: Every day | ORAL | Status: DC
  Administered 2019-03-14: 10:00:00 40 mg via ORAL
  Filled 2019-03-13 (×2): qty 1

## 2019-03-13 MED ORDER — METOPROLOL SUCCINATE ER 25 MG PO TB24
25.0000 mg | ORAL_TABLET | Freq: Every day | ORAL | Status: DC
  Administered 2019-03-14: 10:00:00 25 mg via ORAL
  Filled 2019-03-13: qty 1

## 2019-03-13 MED ORDER — LEVOTHYROXINE SODIUM 100 MCG PO TABS
100.0000 ug | ORAL_TABLET | Freq: Every day | ORAL | Status: DC
  Administered 2019-03-14: 05:00:00 100 ug via ORAL
  Filled 2019-03-13: qty 1

## 2019-03-13 MED ORDER — LIOTHYRONINE SODIUM 25 MCG PO TABS
25.0000 ug | ORAL_TABLET | Freq: Two times a day (BID) | ORAL | Status: DC
  Administered 2019-03-14 (×2): 25 ug via ORAL
  Filled 2019-03-13 (×3): qty 1

## 2019-03-13 NOTE — ED Triage Notes (Signed)
Pt to ED from home via EMS after syncopal episode. No fall from standing. BP at home was 75/59, BP wih EMS was 97/65.   Lower abd cramping, nasuea without vomiting, diarrhea starting today.   October 17th pt dx with bleeding ulcers. 2 units of blood administered at that time.   blood thinners Afib LBBB  CBG 300.  EMS administered 300cc NS and BP was 110/59 upon arrival to ED.

## 2019-03-13 NOTE — ED Notes (Signed)
Patient transported to CT 

## 2019-03-13 NOTE — ED Provider Notes (Signed)
[facility] Emergency Department Provider Note ____________________________________________   None    (approximate)  I have reviewed the triage vital signs and the nursing notes.   HISTORY  Chief Complaint Loss of Consciousness  HPI Katherine [name] is a 73 y.o. female presents to the emergency department after her husband found her on the bathroom floor after she had had a bowel movement.  She states that she has had multiple bowel movements today.  She is unsure whether or not there was blood in the stool.  EMS state that she was initially hypotensive but after about 200 mL of normal saline her blood pressure rose and she became awake, alert, and oriented.  Patient states that she is having abdominal cramping and feels as if she needs to have a bowel movement.  She was recently discharged from the hospital after having upper GI bleeding and received 2 units of blood.  She was advised at that time to stop taking NSAIDs, which the patient states she has.  She is still currently on her Xarelto.      Past Medical History:  Diagnosis Date   Anemia    Cancer ([place])    Cystocele    Depression    Diabetes mellitus without complication ([place])    Glaucoma    Hyperlipidemia    Hypertension    Hypothyroidism     Patient Active Problem List   Diagnosis Date Noted   AKI (acute kidney injury) ([place]) 03/13/2019   GI bleed 02/24/2019   Lower GI bleed 06/25/2015    Past Surgical History:  Procedure Laterality Date   BREAST EXCISIONAL BIOPSY Left 07/2012   CHOLECYSTECTOMY     COLONOSCOPY WITH PROPOFOL N/A 08/17/2015   Procedure: COLONOSCOPY WITH PROPOFOL;  Surgeon: Josefine Class, MD;  Location: [facility] ENDOSCOPY;  Service: Endoscopy;  Laterality: N/A;   ESOPHAGOGASTRODUODENOSCOPY N/A 02/25/2019   Procedure: ESOPHAGOGASTRODUODENOSCOPY (EGD);  Surgeon: Virgel Manifold, MD;  Location: [facility] ENDOSCOPY;  Service: Endoscopy;  Laterality: N/A;     Prior to Admission medications   Medication Sig Start Date End Date Taking? Authorizing Provider  amLODipine (NORVASC) 10 MG tablet Take 10 mg by mouth at bedtime.     [provider]  cholecalciferol (VITAMIN D) 1000 units tablet Take 1,000 Units by mouth daily.    [provider]  diazepam (VALIUM) 5 MG tablet Take 2.5-5 mg by mouth every 8 (eight) hours as needed for anxiety.     [provider]  furosemide (LASIX) 20 MG tablet Take 20 mg by mouth See admin instructions. Take 1 tablet (20mg ) by mouth daily - take 1 additional tablet (20mg ) by mouth daily if needed for swelling    [provider]  glimepiride (AMARYL) 2 MG tablet Take 2 mg by mouth daily with breakfast.     [provider]  liothyronine (CYTOMEL) 25 MCG tablet Take 25 mcg by mouth 2 (two) times daily.     [provider]  lisinopril (PRINIVIL,ZESTRIL) 20 MG tablet Take 20 mg by mouth daily.  06/10/17 02/24/19  [provider]  metFORMIN (GLUCOPHAGE) 1000 MG tablet Take 1,000 mg by mouth 2 (two) times daily with a meal.     [provider]  metoprolol succinate (TOPROL-XL) 25 MG 24 hr tablet Take 25 mg by mouth daily.  10/21/17 02/24/19  [provider]  Multiple Vitamin (MULTI-VITAMINS) TABS Take 1 tablet by mouth daily.     [provider]  pantoprazole (PROTONIX) 40 MG  tablet Take 1 tablet (40 mg total) by mouth daily. 02/27/19   Fritzi Mandes, MD  pioglitazone (ACTOS) 30 MG tablet Take 30 mg by mouth at bedtime.     [provider]  rivaroxaban (XARELTO) 20 MG TABS tablet Take 20 mg by mouth daily with supper.  03/20/15   [provider]  sitaGLIPtin (JANUVIA) 100 MG tablet Take 100 mg by mouth daily.    [provider]  SYNTHROID 100 MCG tablet Take 100 mcg by mouth daily. 03/11/18   [provider]  traMADol (ULTRAM) 50 MG tablet Take 1 tablet (50 mg total) by mouth every 12 (twelve) hours as needed  for severe pain. 02/26/19   Fritzi Mandes, MD    Allergies Codeine, Lipitor [atorvastatin], Morphine and related, Pravastatin, and Vytorin [ezetimibe-simvastatin]  Family History  Problem Relation Age of Onset   Diabetes Mother    Diabetes Father    Diabetes Brother     Social History Social History   Tobacco Use   Smoking status: Former Smoker    Packs/day: 1.00    Years: 8.00    Pack years: 8.00    Types: Cigarettes    Quit date: 05/12/1976    Years since quitting: 42.8   Smokeless tobacco: Never Used  Substance Use Topics   Alcohol use: Yes    Alcohol/week: 0.0 - 2.0 standard drinks    Comment: occasional, not every week   Drug use: No    Review of Systems  Constitutional: No fever/chills.  Generalized weakness Eyes: No visual changes. ENT: No sore throat. Cardiovascular: Denies chest pain. Respiratory: Denies shortness of breath. Gastrointestinal: Positive for abdominal pain.  Positive for nausea, no vomiting.  Positive for diarrhea.  No constipation. Genitourinary: Negative for dysuria. Musculoskeletal: Negative for back pain. Skin: Negative for rash. Neurological: Negative for headaches, focal weakness or numbness. ___________________________________________   PHYSICAL EXAM:  VITAL SIGNS: Vitals:   03/13/19 2000 03/13/19 2030  BP: 116/63 113/63  Pulse: 65 62  Resp: 17 14  Temp:    SpO2: 99% 98%    ED Triage Vitals  Enc Vitals Group     BP      Pulse      Resp      Temp      Temp src      SpO2      Weight      Height      Head Circumference      Peak Flow      Pain Score      Pain Loc      Pain Edu?      Excl. in [place]?     Constitutional: Alert and oriented. Well appearing and in no acute distress. Eyes: Conjunctivae are normal. PERRL. EOMI. Head: Atraumatic. Nose: No congestion/rhinnorhea. Mouth/Throat: Mucous membranes are moist.  Oropharynx non-erythematous. Neck: No stridor.   Hematological/Lymphatic/Immunilogical: No  cervical lymphadenopathy. Cardiovascular: Normal rate, regular rhythm. Grossly normal heart sounds.  Good peripheral circulation. Respiratory: Normal respiratory effort.  No retractions. Lungs CTAB. Gastrointestinal: Soft and nontender. No distention. No abdominal bruits. No CVA tenderness. Genitourinary:  Musculoskeletal: No lower extremity tenderness nor edema.  No joint effusions. Neurologic:  Normal speech and language. No gross focal neurologic deficits are appreciated. No gait instability. Skin:  Skin is warm, dry and intact. No rash noted. Psychiatric: Mood and affect are normal. Speech and behavior are normal.  ____________________________________________   LABS (all labs ordered are listed, but only abnormal results are displayed)  Labs Reviewed  COMPREHENSIVE METABOLIC PANEL - Abnormal; Notable for the following components:      Result Value   Sodium 134 (*)    CO2 21 (*)    Glucose, Bld 228 (*)    BUN 56 (*)    Creatinine, Ser 2.13 (*)    GFR calc non Af Amer 23 (*)    GFR calc Af Amer 26 (*)    All other components within normal limits  CBC WITH DIFFERENTIAL/PLATELET - Abnormal; Notable for the following components:   RBC 3.65 (*)    Hemoglobin 10.5 (*)    HCT 32.9 (*)    All other components within normal limits  SARS CORONAVIRUS 2 (TAT 6-24 HRS)  LIPASE, BLOOD  URINALYSIS, COMPLETE (UACMP) WITH MICROSCOPIC  BASIC METABOLIC PANEL  CBC  TSH   ____________________________________________  EKG  ED ECG REPORT I, Rory Montel, FNP-BC personally viewed and interpreted this ECG.   Date: 03/13/2019  EKG Time: 1608  Rate: 55  Rhythm: sinus bradycardia  Axis: normal  Intervals:left bundle branch block  ST&T Change: no ST elevation  ____________________________________________  RADIOLOGY  ED MD interpretation:    CT of the abdomen and pelvis with oral contrast only is negative for acute findings per radiology.  Official radiology report(s): Ct Abdomen  Pelvis Wo Contrast  Result Date: 03/13/2019 CLINICAL DATA:  Lower abdominal pain, cramping, nausea without vomiting, syncopal episode, diarrhea starting today, diagnosed with bleeding ulcers on 02/26/2019, transfused 2 units of blood at that time, on blood thinners for atrial fibrillation, history hypertension, diabetes mellitus, former smoker EXAM: CT ABDOMEN AND PELVIS WITHOUT CONTRAST TECHNIQUE: Multidetector CT imaging of the abdomen and pelvis was performed following the standard protocol without IV contrast. Sagittal and coronal MPR images reconstructed from axial data set. Patient drank dilute oral contrast for exam COMPARISON:  06/25/2015 FINDINGS: Lower chest: Minimal linear subsegmental atelectasis in lingula. Lung bases otherwise clear. Hepatobiliary: Gallbladder surgically absent. Upper normal hepatic attenuation without focal mass lesion. Minimal central intrahepatic biliary dilatation, potentially physiologic. Pancreas: Fatty replacement of pancreas. Single parenchymal calcification at pancreatic head adjacent to duodenum. No distal CBD calcification or pancreatic mass. Spleen: Normal appearance Adrenals/Urinary Tract: Adrenal glands, kidneys, ureters, and bladder normal appearance Stomach/Bowel: Normal appendix. Stomach and bowel loops unremarkable for technique. Vascular/Lymphatic: Atherosclerotic calcifications of aorta and iliac arteries without aneurysm. Coronary arterial calcifications noted. No adenopathy. Reproductive: Unremarkable uterus and ovaries Other: No free air or free fluid. No hernia or acute inflammatory process. Musculoskeletal: Osseous demineralization. IMPRESSION: No acute intra-abdominal or intrapelvic abnormalities. Aortic Atherosclerosis (ICD10-I70.0). Electronically Signed   By: Lavonia Dana M.D.   On: 03/13/2019 19:32   Ct Head Wo Contrast  Result Date: 03/13/2019 CLINICAL DATA:  Syncopal episode. EXAM: CT HEAD WITHOUT CONTRAST TECHNIQUE: Contiguous axial images were  obtained from the base of the skull through the vertex without intravenous contrast. COMPARISON:  None. FINDINGS: Brain: No evidence of acute infarction, hemorrhage, hydrocephalus, extra-axial collection, or mass lesion/mass effect. Mild diffuse cerebral atrophy noted. Vascular:  No hyperdense vessel or other acute findings. Skull: No evidence of fracture or other significant bone abnormality. Sinuses/Orbits:  No acute findings. Other: None. IMPRESSION: No acute findings. Mild cerebral atrophy. Electronically Signed   By: Marlaine Hind M.D.   On: 03/13/2019 19:29    ____________________________________________   PROCEDURES  Procedure(s) performed (including Critical Care):  Procedures  ____________________________________________   INITIAL IMPRESSION / ASSESSMENT AND PLAN     73 year old female presenting to the emergency department for treatment  and evaluation after a syncopal episode.  She is now alert and oriented.  Her blood pressure is a little soft.  She will receive a liter of fluids, labs, CT studies.  Neuro exam is reassuring.  DIFFERENTIAL DIAGNOSIS  Vagal episode, anemia, GI bleed,  ED COURSE  Patient has remained alert, oriented while in the emergency department without any episodes of syncope.  CT scan of the abdomen and pelvis is reassuring.  She does however have an acute kidney injury with BUN of 56 and a creatinine of 2.13.  She will be admitted for further work-up and evaluation. ____________________________________________   FINAL CLINICAL IMPRESSION(S) / ED DIAGNOSES  Final diagnoses:  AKI (acute kidney injury) ([place])  Acute kidney injury ([place])  Syncope, unspecified syncope type  Hypotension, unspecified hypotension type     ED Discharge Orders    None       Note:  This document was prepared using Dragon voice recognition software and may include unintentional dictation errors.   Victorino Dike, FNP 03/13/19 [id number]    Lilia Pro., MD 03/14/19  1525

## 2019-03-13 NOTE — H&P (Addendum)
[facility] NAME: Katherine [name]    MR#:  [id number]  DATE OF BIRTH:  1946/02/27  DATE OF ADMISSION:  03/13/2019  PRIMARY CARE PHYSICIAN: [name], MD   REQUESTING/REFERRING PHYSICIAN: Victorino Dike, FNP  CHIEF COMPLAINT:   Chief Complaint  Patient presents with   Loss of Consciousness    HISTORY OF PRESENT ILLNESS:  Katherine [name]  is a 73 y.o. Caucasian female with a known history of hypertension, type 2 diabetes mellitus, dyslipidemia and hypothyroidism as well as recent history of upper GI bleeding for which she was admitted here and transfused 2 units of packed red blood cells and diagnosed with Barrett's esophagus in nonbleeding gastric ulcers, who presented to the emergency room with acute onset of syncope this afternoon.  The patient was on a recliner and got up to go to the bathroom when she lost consciousness for a few seconds.  She then mated to get to the bathroom and she had significant lower abdominal pain and had subsequent profuse diarrhea.  She had mild nausea and diaphoresis on her commode and felt she was going to pass out but did not.  She denied any chest pain or palpitations.  No cough or wheezing or hemoptysis.  No paresthesias or focal muscle weakness.  She denied any recent fever or chills.  No sick exposure to Covid 19..  The patient was noted to be hypotensive by EMS with systolic blood pressure in the 70s.  She was given IV fluids and blood pressure improved to the 90s.  No reported hypoglycemia.  She admitted to feeling generally weak lately and not having much appetite.  Upon presentation to the emergency room, she was initially pale and diaphoretic and blood pressure was 96/51 with a pulse of 57 respiratory rate of 11 and temperature 97.8.  Labs revealed mild hyponatremia 134 and a BUN and creatinine of 56/2.13 compared to 17 and 0.83 on 02/26/2019.  Hemoglobin and hematocrit are 10.5 and 32.9 and blood glucose was  228.  Noncontrasted head CT scan revealed no acute intracranial normalities but showed mild cerebral atrophy.  Abdominal and pelvic CT scan revealed no acute abnormalities but showed aortic atherosclerosis.  The patient was given 1.5 L of IV normal saline bolus here.  Her blood pressure has improved to 121/52.  She will be admitted to a medical monitored bed for further evaluation and management. PAST MEDICAL HISTORY:   Past Medical History:  Diagnosis Date   Anemia    Cancer ([place])    Cystocele    Depression    Diabetes mellitus without complication ([place])    Glaucoma    Hyperlipidemia    Hypertension    Hypothyroidism     PAST SURGICAL HISTORY:   Past Surgical History:  Procedure Laterality Date   BREAST EXCISIONAL BIOPSY Left 07/2012   CHOLECYSTECTOMY     COLONOSCOPY WITH PROPOFOL N/A 08/17/2015   Procedure: COLONOSCOPY WITH PROPOFOL;  Surgeon: Josefine Class, MD;  Location: [facility] ENDOSCOPY;  Service: Endoscopy;  Laterality: N/A;   ESOPHAGOGASTRODUODENOSCOPY N/A 02/25/2019   Procedure: ESOPHAGOGASTRODUODENOSCOPY (EGD);  Surgeon: Virgel Manifold, MD;  Location[facility] ENDOSCOPY;  Service: Endoscopy;  Laterality: N/A;    SOCIAL HISTORY:   Social History   Tobacco Use   Smoking status: Former Smoker    Packs/day: 1.00    Years: 8.00    Pack years: 8.00    Types: Cigarettes    Quit date: 05/12/1976    Years  since quitting: 42.8   Smokeless tobacco: Never Used  Substance Use Topics   Alcohol use: Yes    Alcohol/week: 0.0 - 2.0 standard drinks    Comment: occasional, not every week    FAMILY HISTORY:   Family History  Problem Relation Age of Onset   Diabetes Mother    Diabetes Father    Diabetes Brother     DRUG ALLERGIES:   Allergies  Allergen Reactions   Codeine    Lipitor [Atorvastatin]    Morphine And Related    Pravastatin    Vytorin [Ezetimibe-Simvastatin]     REVIEW OF SYSTEMS:   ROS As per history of present  illness. All pertinent systems were reviewed above. Constitutional,  HEENT, cardiovascular, respiratory, GI, GU, musculoskeletal, neuro, psychiatric, endocrine,  integumentary and hematologic systems were reviewed and are otherwise  negative/unremarkable except for positive findings mentioned above in the HPI.   MEDICATIONS AT HOME:   Prior to Admission medications   Medication Sig Start Date End Date Taking? Authorizing Provider  amLODipine (NORVASC) 10 MG tablet Take 10 mg by mouth at bedtime.     [provider]  cholecalciferol (VITAMIN D) 1000 units tablet Take 1,000 Units by mouth daily.    [provider]  diazepam (VALIUM) 5 MG tablet Take 2.5-5 mg by mouth every 8 (eight) hours as needed for anxiety.     [provider]  furosemide (LASIX) 20 MG tablet Take 20 mg by mouth See admin instructions. Take 1 tablet (20mg ) by mouth daily - take 1 additional tablet (20mg ) by mouth daily if needed for swelling    [provider]  glimepiride (AMARYL) 2 MG tablet Take 2 mg by mouth daily with breakfast.     [provider]  liothyronine (CYTOMEL) 25 MCG tablet Take 25 mcg by mouth 2 (two) times daily.     [provider]  lisinopril (PRINIVIL,ZESTRIL) 20 MG tablet Take 20 mg by mouth daily.  06/10/17 02/24/19  [provider]  metFORMIN (GLUCOPHAGE) 1000 MG tablet Take 1,000 mg by mouth 2 (two) times daily with a meal.     [provider]  metoprolol succinate (TOPROL-XL) 25 MG 24 hr tablet Take 25 mg by mouth daily.  10/21/17 02/24/19  [provider]  Multiple Vitamin (MULTI-VITAMINS) TABS Take 1 tablet by mouth daily.     [provider]  pantoprazole (PROTONIX) 40 MG tablet Take 1 tablet (40 mg total) by mouth daily. 02/27/19   Fritzi Mandes, MD  pioglitazone (ACTOS) 30 MG tablet Take 30 mg by mouth at bedtime.     [provider]  rivaroxaban (XARELTO) 20 MG TABS tablet Take 20 mg by mouth daily  with supper.  03/20/15   [provider]  sitaGLIPtin (JANUVIA) 100 MG tablet Take 100 mg by mouth daily.    [provider]  SYNTHROID 100 MCG tablet Take 100 mcg by mouth daily. 03/11/18   [provider]  traMADol (ULTRAM) 50 MG tablet Take 1 tablet (50 mg total) by mouth every 12 (twelve) hours as needed for severe pain. 02/26/19   Fritzi Mandes, MD      VITAL SIGNS:  Blood pressure 113/63, pulse 62, temperature 97.8 F (36.6 C), temperature source Oral, resp. rate 14, height 5\' 7"  (1.702 m), weight 99 kg, SpO2 98 %.  PHYSICAL EXAMINATION:  Physical Exam  GENERAL:  73 y.o.-year-old Caucasian female patient lying in the bed with no acute distress.  EYES: Pupils equal, round, reactive to  light and accommodation. No scleral icterus. Extraocular muscles intact.  HEENT: Head atraumatic, normocephalic. Oropharynx and nasopharynx clear.  NECK:  Supple, no jugular venous distention. No thyroid enlargement, no tenderness.  LUNGS: Normal breath sounds bilaterally, no wheezing, rales,rhonchi or crepitation. No use of accessory muscles of respiration.  CARDIOVASCULAR: Regular rate and rhythm, S1, S2 normal. No murmurs, rubs, or gallops.  ABDOMEN: Soft, nondistended, nontender. Bowel sounds present. No organomegaly or mass.  EXTREMITIES: No pedal edema, cyanosis, or clubbing.  NEUROLOGIC: Cranial nerves II through XII are intact. Muscle strength 5/5 in all extremities. Sensation intact. Gait not checked.  PSYCHIATRIC: The patient is alert and oriented x 3.  Normal affect and good eye contact. SKIN: No obvious rash, lesion, or ulcer.   LABORATORY PANEL:   CBC Recent Labs  Lab 03/13/19 1610  WBC 6.7  HGB 10.5*  HCT 32.9*  PLT 396   ------------------------------------------------------------------------------------------------------------------  Chemistries  Recent Labs  Lab 03/13/19 1610  NA 134*  K 4.3  CL 99  CO2 21*  GLUCOSE 228*  BUN 56*  CREATININE  2.13*  CALCIUM 9.8  AST 28  ALT 18  ALKPHOS 46  BILITOT 0.6   ------------------------------------------------------------------------------------------------------------------  Cardiac Enzymes No results for input(s): TROPONINI in the last 168 hours. ------------------------------------------------------------------------------------------------------------------  RADIOLOGY:  Ct Abdomen Pelvis Wo Contrast  Result Date: 03/13/2019 CLINICAL DATA:  Lower abdominal pain, cramping, nausea without vomiting, syncopal episode, diarrhea starting today, diagnosed with bleeding ulcers on 02/26/2019, transfused 2 units of blood at that time, on blood thinners for atrial fibrillation, history hypertension, diabetes mellitus, former smoker EXAM: CT ABDOMEN AND PELVIS WITHOUT CONTRAST TECHNIQUE: Multidetector CT imaging of the abdomen and pelvis was performed following the standard protocol without IV contrast. Sagittal and coronal MPR images reconstructed from axial data set. Patient drank dilute oral contrast for exam COMPARISON:  06/25/2015 FINDINGS: Lower chest: Minimal linear subsegmental atelectasis in lingula. Lung bases otherwise clear. Hepatobiliary: Gallbladder surgically absent. Upper normal hepatic attenuation without focal mass lesion. Minimal central intrahepatic biliary dilatation, potentially physiologic. Pancreas: Fatty replacement of pancreas. Single parenchymal calcification at pancreatic head adjacent to duodenum. No distal CBD calcification or pancreatic mass. Spleen: Normal appearance Adrenals/Urinary Tract: Adrenal glands, kidneys, ureters, and bladder normal appearance Stomach/Bowel: Normal appendix. Stomach and bowel loops unremarkable for technique. Vascular/Lymphatic: Atherosclerotic calcifications of aorta and iliac arteries without aneurysm. Coronary arterial calcifications noted. No adenopathy. Reproductive: Unremarkable uterus and ovaries Other: No free air or free fluid. No hernia  or acute inflammatory process. Musculoskeletal: Osseous demineralization. IMPRESSION: No acute intra-abdominal or intrapelvic abnormalities. Aortic Atherosclerosis (ICD10-I70.0). Electronically Signed   By: Lavonia Dana M.D.   On: 03/13/2019 19:32   Ct Head Wo Contrast  Result Date: 03/13/2019 CLINICAL DATA:  Syncopal episode. EXAM: CT HEAD WITHOUT CONTRAST TECHNIQUE: Contiguous axial images were obtained from the base of the skull through the vertex without intravenous contrast. COMPARISON:  None. FINDINGS: Brain: No evidence of acute infarction, hemorrhage, hydrocephalus, extra-axial collection, or mass lesion/mass effect. Mild diffuse cerebral atrophy noted. Vascular:  No hyperdense vessel or other acute findings. Skull: No evidence of fracture or other significant bone abnormality. Sinuses/Orbits:  No acute findings. Other: None. IMPRESSION: No acute findings. Mild cerebral atrophy. Electronically Signed   By: Marlaine Hind M.D.   On: 03/13/2019 19:29      IMPRESSION AND PLAN:   1.  Acute kidney injury.  This is likely prerenal secondary to volume depletion and dehydration from recent anorexia and diarrhea as well as hypotension that could be  associated with ATN.  She will be placed on hydration with IV normal saline and will hold off Zestril and Metformin as well as Lasix.  BMP will be followed.  I do not believe we need a renal ultrasound at this time pending further renal functions with hydration.  We will obtain stool studies.  2.  Syncope.Will check orthostatics q 12 hours.  Will obtain a bilateral carotid Doppler and 2D echo.  The patient will be gently hydrated with IV normal saline and monitored for arrhythmias. Differential diagnosis would include neurally mediated syncope, cardiogenic, arrhythmias related,  orthostatic hypotension that is more likely due to volume depletion and less likely hypoglycemia.  3.  Type 2 diabetes mellitus.  She will be placed on supplemental coverage with  NovoLog we will continue her Amaryl and Januvia while holding off her metformin and Actos.  4.  Hypertension.  Norvasc and Toprol-XL will be continued with holding parameters all Zestril will be held off.  5.  Hypothyroidism.  We will check her TSH and continue Synthroid.  6.  Paroxysmal atrial fibrillation.  We will continue Xarelto and Toprol-XL.  We will check an EKG.  7.  DVT prophylaxis.  We will continue Xarelto.  8.  GI prophylaxis.  We will continue PPI therapy given history of GERD.   All the records are reviewed and case discussed with ED provider. The plan of care was discussed in details with the patient (and family). I answered all questions. The patient agreed to proceed with the above mentioned plan. Further management will depend upon hospital course.   CODE STATUS: Full code  TOTAL TIME TAKING CARE OF THIS PATIENT: 55 minutes.    Christel Mormon M.D on 03/13/2019 at 9:11 PM  [facility]   From 7 PM-7 AM, contact night-coverage [email or web address]  CC: Primary care physician; [name], MD   Note: This dictation was prepared with Dragon dictation along with smaller phrase technology. Any transcriptional errors that result from this process are unintentional.

## 2019-03-13 NOTE — ED Notes (Signed)
Pt given water to drink. Pt does not want a meal tray at this time.

## 2019-03-13 NOTE — ED Notes (Signed)
EDP in room discussing plan of care with pt.

## 2019-03-13 NOTE — ED Notes (Signed)
CT made aware pt has finished her contrast.

## 2019-03-13 NOTE — ED Notes (Signed)
Warm blanket given to patient. Husband and patient both updated to the best of this RNs ability.

## 2019-03-14 ENCOUNTER — Inpatient Hospital Stay: Payer: Medicare Other

## 2019-03-14 ENCOUNTER — Inpatient Hospital Stay (HOSPITAL_COMMUNITY)
Admit: 2019-03-14 | Discharge: 2019-03-14 | Disposition: A | Discharge status: Home / Self Care | Payer: Medicare Other | Attending: Family Medicine | Admitting: Family Medicine

## 2019-03-14 DIAGNOSIS — Z87891 Personal history of nicotine dependence: Secondary | ICD-10-CM | POA: Diagnosis not present

## 2019-03-14 DIAGNOSIS — I959 Hypotension, unspecified: Secondary | ICD-10-CM | POA: Insufficient documentation

## 2019-03-14 DIAGNOSIS — F329 Major depressive disorder, single episode, unspecified: Secondary | ICD-10-CM | POA: Diagnosis present

## 2019-03-14 DIAGNOSIS — R630 Anorexia: Secondary | ICD-10-CM | POA: Diagnosis present

## 2019-03-14 DIAGNOSIS — K219 Gastro-esophageal reflux disease without esophagitis: Secondary | ICD-10-CM | POA: Diagnosis present

## 2019-03-14 DIAGNOSIS — D649 Anemia, unspecified: Secondary | ICD-10-CM | POA: Diagnosis present

## 2019-03-14 DIAGNOSIS — I700 Atherosclerosis of aorta: Secondary | ICD-10-CM | POA: Diagnosis present

## 2019-03-14 DIAGNOSIS — E860 Dehydration: Secondary | ICD-10-CM | POA: Diagnosis present

## 2019-03-14 DIAGNOSIS — R197 Diarrhea, unspecified: Secondary | ICD-10-CM | POA: Diagnosis not present

## 2019-03-14 DIAGNOSIS — E871 Hypo-osmolality and hyponatremia: Secondary | ICD-10-CM | POA: Diagnosis present

## 2019-03-14 DIAGNOSIS — K2270 Barrett's esophagus without dysplasia: Secondary | ICD-10-CM | POA: Diagnosis present

## 2019-03-14 DIAGNOSIS — R55 Syncope and collapse: Secondary | ICD-10-CM

## 2019-03-14 DIAGNOSIS — Z885 Allergy status to narcotic agent status: Secondary | ICD-10-CM | POA: Diagnosis not present

## 2019-03-14 DIAGNOSIS — I6522 Occlusion and stenosis of left carotid artery: Secondary | ICD-10-CM | POA: Diagnosis present

## 2019-03-14 DIAGNOSIS — E119 Type 2 diabetes mellitus without complications: Secondary | ICD-10-CM

## 2019-03-14 DIAGNOSIS — I480 Paroxysmal atrial fibrillation: Secondary | ICD-10-CM | POA: Diagnosis present

## 2019-03-14 DIAGNOSIS — E785 Hyperlipidemia, unspecified: Secondary | ICD-10-CM | POA: Diagnosis present

## 2019-03-14 DIAGNOSIS — Z8711 Personal history of peptic ulcer disease: Secondary | ICD-10-CM | POA: Diagnosis not present

## 2019-03-14 DIAGNOSIS — Z7901 Long term (current) use of anticoagulants: Secondary | ICD-10-CM | POA: Diagnosis not present

## 2019-03-14 DIAGNOSIS — I10 Essential (primary) hypertension: Secondary | ICD-10-CM | POA: Diagnosis present

## 2019-03-14 DIAGNOSIS — N179 Acute kidney failure, unspecified: Secondary | ICD-10-CM | POA: Diagnosis present

## 2019-03-14 DIAGNOSIS — Z20828 Contact with and (suspected) exposure to other viral communicable diseases: Secondary | ICD-10-CM | POA: Diagnosis present

## 2019-03-14 DIAGNOSIS — E039 Hypothyroidism, unspecified: Secondary | ICD-10-CM | POA: Diagnosis present

## 2019-03-14 DIAGNOSIS — Z9049 Acquired absence of other specified parts of digestive tract: Secondary | ICD-10-CM | POA: Diagnosis not present

## 2019-03-14 DIAGNOSIS — H409 Unspecified glaucoma: Secondary | ICD-10-CM | POA: Diagnosis present

## 2019-03-14 LAB — URINALYSIS, COMPLETE (UACMP) WITH MICROSCOPIC
Bacteria, UA: NONE SEEN
Bilirubin Urine: NEGATIVE
Glucose, UA: NEGATIVE mg/dL
Hgb urine dipstick: NEGATIVE
Ketones, ur: NEGATIVE mg/dL
Nitrite: NEGATIVE
Protein, ur: NEGATIVE mg/dL
Specific Gravity, Urine: 1.012 (ref 1.005–1.030)
pH: 5 (ref 5.0–8.0)

## 2019-03-14 LAB — BASIC METABOLIC PANEL
Anion gap: 6 (ref 5–15)
BUN: 40 mg/dL — ABNORMAL HIGH (ref 8–23)
CO2: 22 mmol/L (ref 22–32)
Calcium: 7.9 mg/dL — ABNORMAL LOW (ref 8.9–10.3)
Chloride: 110 mmol/L (ref 98–111)
Creatinine, Ser: 1.24 mg/dL — ABNORMAL HIGH (ref 0.44–1.00)
GFR calc Af Amer: 51 mL/min — ABNORMAL LOW (ref >60)
GFR calc non Af Amer: 44 mL/min — ABNORMAL LOW (ref >60)
Glucose, Bld: 75 mg/dL (ref 70–99)
Potassium: 3.4 mmol/L — ABNORMAL LOW (ref 3.5–5.1)
Sodium: 138 mmol/L (ref 135–145)

## 2019-03-14 LAB — CBC
HCT: 26.8 % — ABNORMAL LOW (ref 36.0–46.0)
Hemoglobin: 8.6 g/dL — ABNORMAL LOW (ref 12.0–15.0)
MCH: 28.4 pg (ref 26.0–34.0)
MCHC: 32.1 g/dL (ref 30.0–36.0)
MCV: 88.4 fL (ref 80.0–100.0)
Platelets: 314 10*3/uL (ref 150–400)
RBC: 3.03 MIL/uL — ABNORMAL LOW (ref 3.87–5.11)
RDW: 15.2 % (ref 11.5–15.5)
WBC: 6.1 10*3/uL (ref 4.0–10.5)
nRBC: 0 % (ref 0.0–0.2)

## 2019-03-14 LAB — GASTROINTESTINAL PANEL BY PCR, STOOL (REPLACES STOOL CULTURE)

## 2019-03-14 LAB — ECHOCARDIOGRAM COMPLETE
Height: 67 in
Weight: 3492.09 oz

## 2019-03-14 LAB — GLUCOSE, CAPILLARY: Glucose-Capillary: 72 mg/dL (ref 70–99)

## 2019-03-14 LAB — OCCULT BLOOD X 1 CARD TO LAB, STOOL: Fecal Occult Bld: POSITIVE — AB

## 2019-03-14 LAB — C DIFFICILE QUICK SCREEN W PCR REFLEX
C Diff antigen: NEGATIVE
C Diff interpretation: NOT DETECTED
C Diff toxin: NEGATIVE

## 2019-03-14 LAB — SARS CORONAVIRUS 2 (TAT 6-24 HRS): SARS Coronavirus 2: NEGATIVE

## 2019-03-14 IMAGING — US US CAROTID DUPLEX BILAT
1 series · 13 of 24 positions shown · non-contrast
Comparison: None.

CLINICAL DATA: 73-year-old female with a history of syncope

EXAM:
BILATERAL CAROTID DUPLEX ULTRASOUND
TECHNIQUE: Gray scale imaging, color Doppler and duplex ultrasound were
performed of bilateral carotid and vertebral arteries in the neck.

[Series 1: us carotid duplex bilat · 13 of 185 slices shown]
[im 1/185]
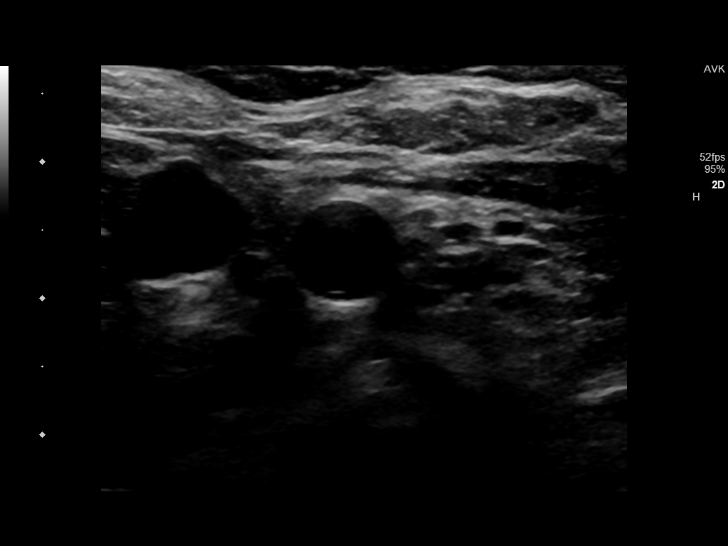
[im 17/185]
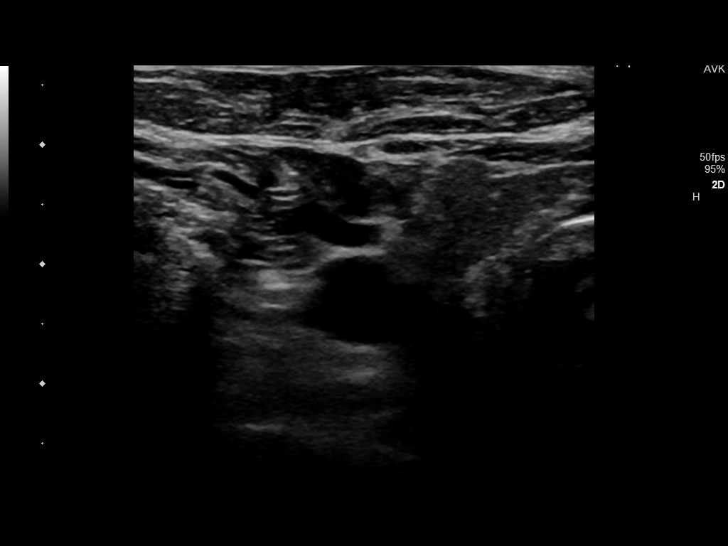
[im 33/185]
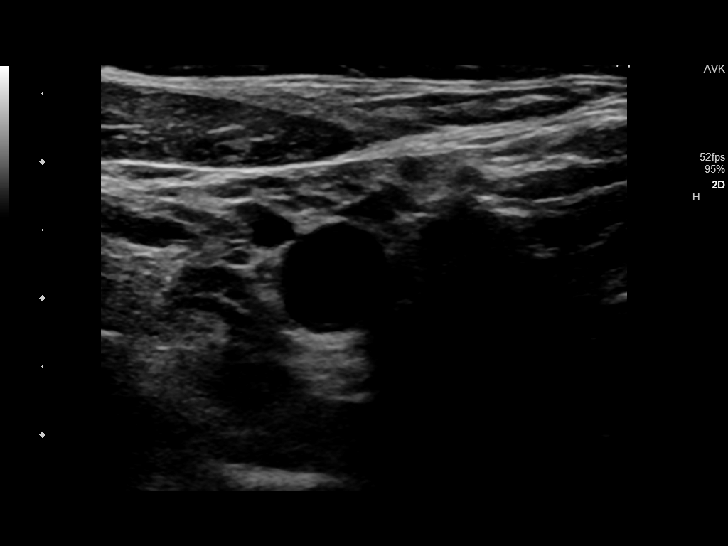
[im 49/185]
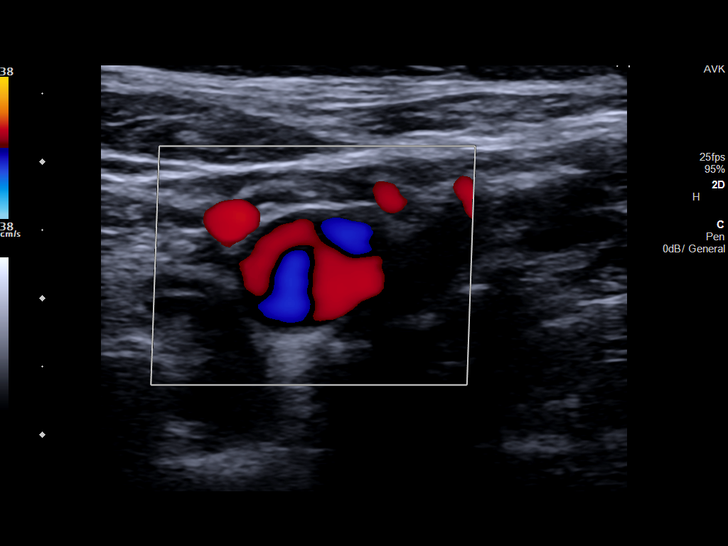
[im 65/185]
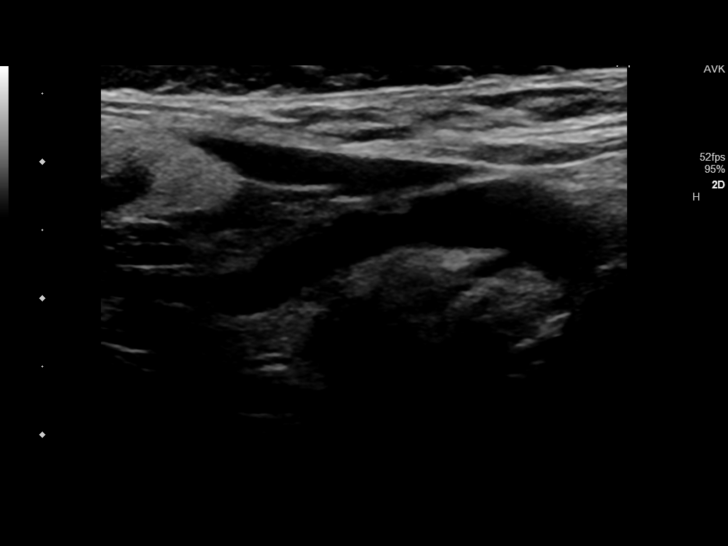
[im 81/185]
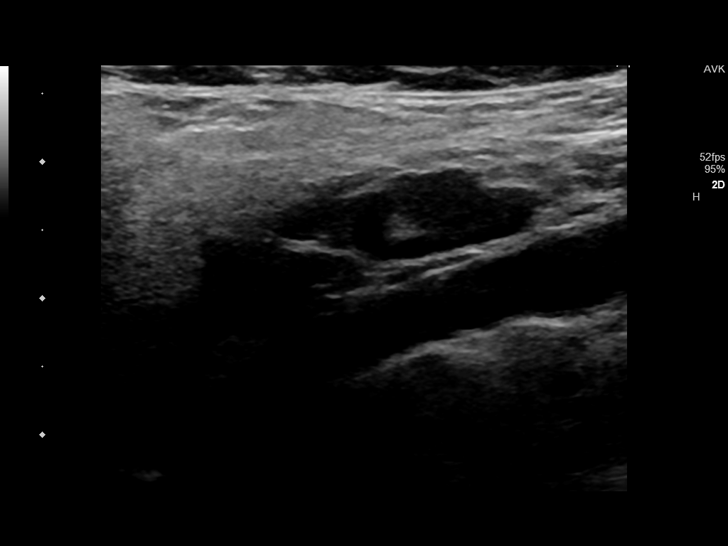
[im 97/185]
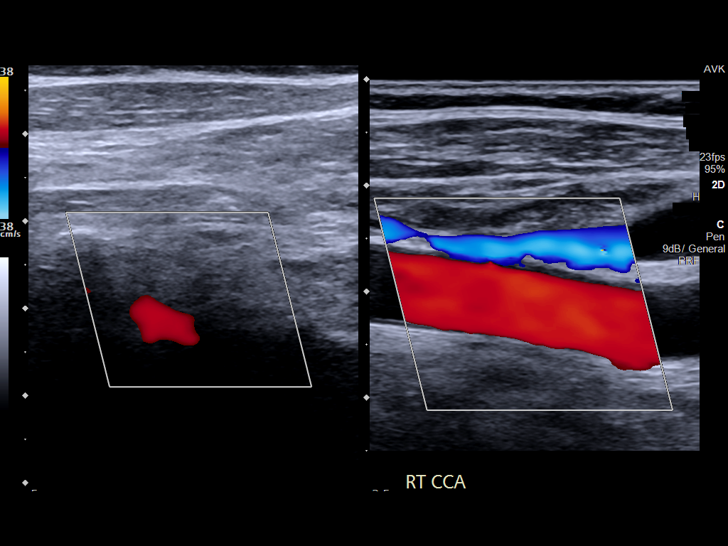
[im 105/185]
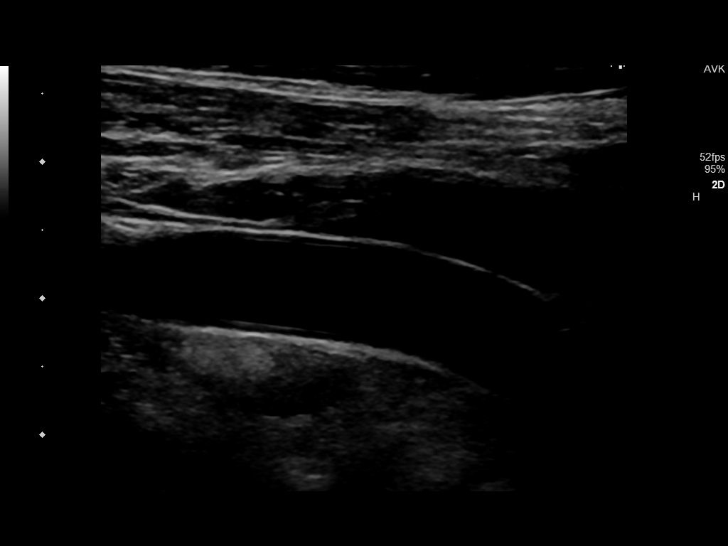
[im 121/185]
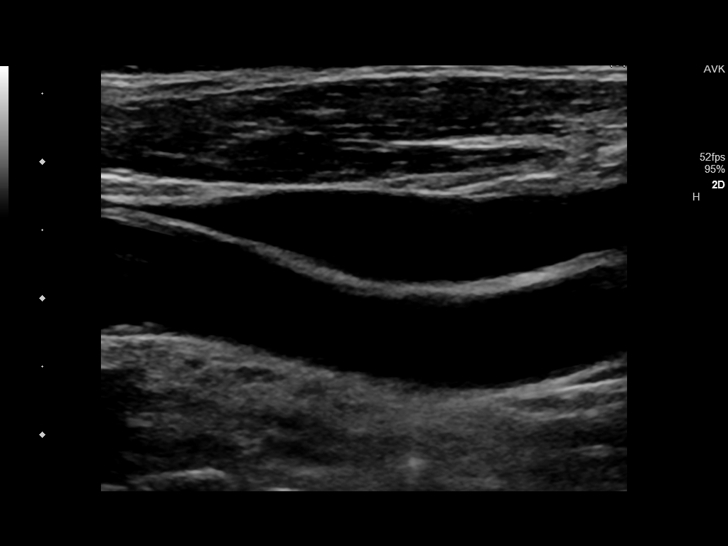
[im 137/185]
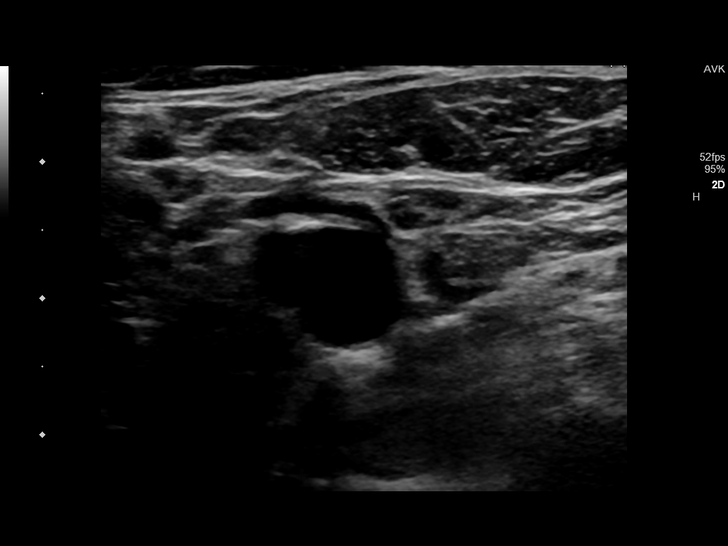
[im 153/185]
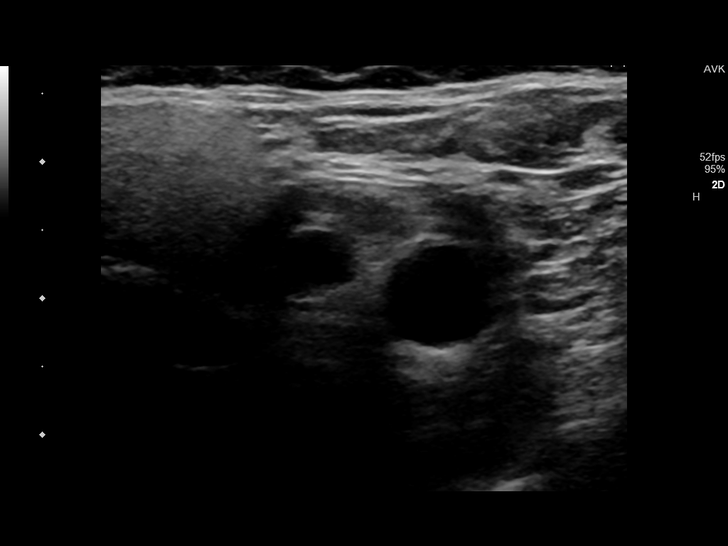
[im 169/185]
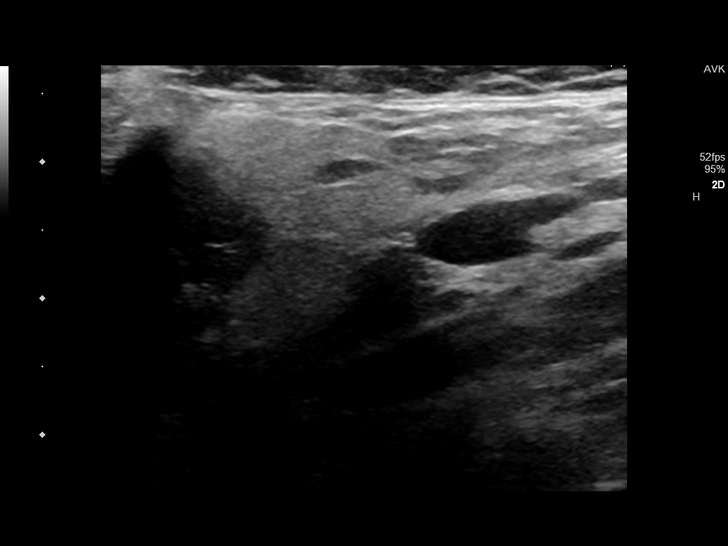
[im 185/185]
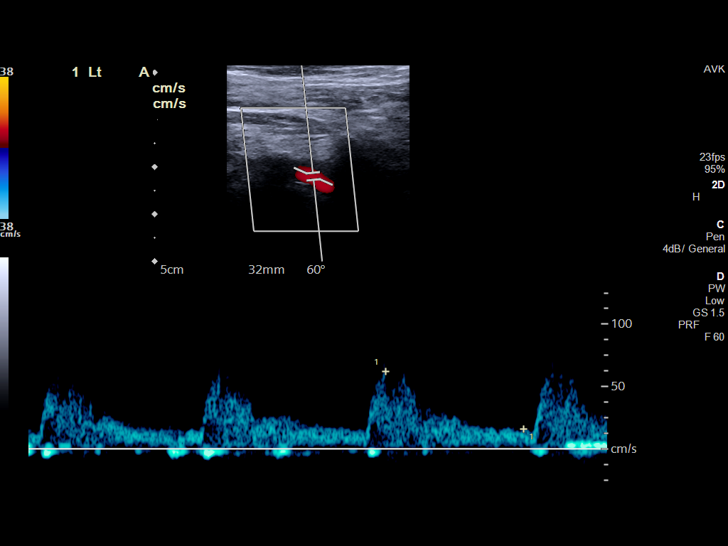

[13 of 24 positions shown; findings below may reference images not displayed]

FINDINGS: Criteria: Quantification of carotid stenosis is based on velocity
parameters that correlate the residual internal carotid diameter
with NASCET-based stenosis levels, using the diameter of the distal
internal carotid lumen as the denominator for stenosis measurement.

The following velocity measurements were obtained:

RIGHT

ICA:  Systolic 81 cm/sec, Diastolic 14 cm/sec

CCA:  81 cm/sec

SYSTOLIC ICA/CCA RATIO:

ECA:  134 cm/sec

LEFT

ICA:  Systolic 50 cm/sec, Diastolic 13 cm/sec

CCA:  83 cm/sec

SYSTOLIC ICA/CCA RATIO:

ECA:  94 cm/sec

Right Brachial SBP: Not acquired

Left Brachial SBP: Not acquired

RIGHT CAROTID ARTERY: No significant calcified disease of the right
common carotid artery. Intermediate waveform maintained. Homogeneous
plaque without significant calcifications at the right carotid
bifurcation. Low resistance waveform of the right ICA. No
significant tortuosity.

RIGHT VERTEBRAL ARTERY: Antegrade flow with low resistance waveform.

LEFT CAROTID ARTERY: No significant calcified disease of the left
common carotid artery. Intermediate waveform maintained. Homogeneous
plaque at the left carotid bifurcation without significant
calcifications. Low resistance waveform of the left ICA.

LEFT VERTEBRAL ARTERY:  Antegrade flow with low resistance waveform.
IMPRESSION: Color duplex indicates minimal homogeneous plaque, with no
hemodynamically significant stenosis by duplex criteria in the
extracranial cerebrovascular circulation.

## 2019-03-14 MED ORDER — AMLODIPINE BESYLATE 5 MG PO TABS: 5.0000 mg | ORAL_TABLET | Freq: Every day | ORAL | 0 refills | Status: AC

## 2019-03-14 MED ORDER — AMLODIPINE BESYLATE 5 MG PO TABS: 5.0000 mg | ORAL_TABLET | Freq: Every day | ORAL | Status: DC

## 2019-03-14 NOTE — ED Notes (Signed)
Pt aware of need for stool specimen.

## 2019-03-14 NOTE — Discharge Summary (Signed)
[facility] NAME: Katherine [name]    MR#:  [id number]  DATE OF BIRTH:  16-Sep-1945  DATE OF ADMISSION:  03/13/2019 ADMITTING PHYSICIAN: No admitting provider for patient encounter.  DATE OF DISCHARGE: 03/14/2019  2:17 PM  PRIMARY CARE PHYSICIAN: [name], MD    ADMISSION DIAGNOSIS:  Syncope-EMS  DISCHARGE DIAGNOSIS:  Active Problems:   AKI (acute kidney injury) ([place])   Vasovagal syncope   Syncope   SECONDARY DIAGNOSIS:   Past Medical History:  Diagnosis Date  . Anemia   . Cancer ([place])   . Cystocele   . Depression   . Diabetes mellitus without complication ([place])   . Glaucoma   . Hyperlipidemia   . Hypertension   . Hypothyroidism     HOSPITAL COURSE:   1.  Vasovagal syncope.  Unfortunately orthostatics not done on presentation to the emergency room.  When I did orthostatics the patient was not orthostatic.  Patient had an episode of diarrhea after her passing out episode.  Patient feeling better and seen walking around the emergency room.  Patient wanted to go home.  Carotid ultrasound negative.  Patient had an echocardiogram within the year.  No arrhythmia on telemetry. 2.  Acute kidney injury.  Patient was given IV fluid hydration.  Since her blood pressure was on the lower side I held her Toprol, lisinopril and Lasix.  Cut back the dose on the Norvasc.  Recommend checking a BMP as outpatient. 3.  Type 2 diabetes mellitus.  A1c is very good at 6.2.  Would hold glimepiride and Actos.  Can go back on Metformin once diarrhea stops. 4.  Hypertension.  Blood pressure on the lower side.  Holding lisinopril, Lasix and Toprol and restarted lower dose Norvasc. 5.  Diarrhea.  Stool studies ordered but still pending.  I saw the last bowel movement and it was soft not diarrhea.  I did not see any blood in the bowel movement. 6.  Paroxysmal atrial fibrillation on Xarelto 7.  Hypothyroidism on Synthroid 8.  Anemia.  When she  came in she was dehydrated and hemoglobin was up at 10.5.  After hydration hemoglobin was 8.6 which is similar to last month when it was 8.1 and 7.6.  DISCHARGE CONDITIONS:   Satisfactory  CONSULTS OBTAINED:  None  DRUG ALLERGIES:   Allergies  Allergen Reactions  . Lipitor [Atorvastatin]     Severe weakness, joint pain  . Pravastatin     Severe weakness, joint pain  . Codeine   . Morphine And Related   . Vytorin [Ezetimibe-Simvastatin]     DISCHARGE MEDICATIONS:   Allergies as of 03/14/2019      Reactions   Lipitor [atorvastatin]    Severe weakness, joint pain   Pravastatin    Severe weakness, joint pain   Codeine    Morphine And Related    Vytorin [ezetimibe-simvastatin]       Medication List    STOP taking these medications   furosemide 20 MG tablet Commonly known as: LASIX   glimepiride 2 MG tablet Commonly known as: AMARYL   lisinopril 20 MG tablet Commonly known as: ZESTRIL   metoprolol succinate 25 MG 24 hr tablet Commonly known as: TOPROL-XL   pioglitazone 30 MG tablet Commonly known as: ACTOS     TAKE these medications   amLODipine 5 MG tablet Commonly known as: NORVASC Take 1 tablet (5 mg total) by mouth at bedtime. What changed:   medication strength  how much to take   cholecalciferol 1000 units tablet Commonly known as: VITAMIN D Take 1,000 Units by mouth daily.   diazepam 5 MG tablet Commonly known as: VALIUM Take 2.5-5 mg by mouth every 8 (eight) hours as needed for anxiety.   liothyronine 25 MCG tablet Commonly known as: CYTOMEL Take 25 mcg by mouth 2 (two) times daily.   metFORMIN 1000 MG tablet Commonly known as: GLUCOPHAGE Take 1,000 mg by mouth 2 (two) times daily with a meal.   Multi-Vitamins Tabs Take 1 tablet by mouth daily.   pantoprazole 40 MG tablet Commonly known as: PROTONIX Take 1 tablet (40 mg total) by mouth daily.   rivaroxaban 20 MG Tabs tablet Commonly known as: XARELTO Take 20 mg by mouth daily  with supper.   sitaGLIPtin 100 MG tablet Commonly known as: JANUVIA Take 100 mg by mouth daily.   Synthroid 150 MCG tablet Generic drug: levothyroxine Take 100 mcg by mouth daily.   traMADol 50 MG tablet Commonly known as: ULTRAM Take 1 tablet (50 mg total) by mouth every 12 (twelve) hours as needed for severe pain.        DISCHARGE INSTRUCTIONS:   Satisfactory  If you experience worsening of your admission symptoms, develop shortness of breath, life threatening emergency, suicidal or homicidal thoughts you must seek medical attention immediately by calling 911 or calling your MD immediately  if symptoms less severe.  You Must read complete instructions/literature along with all the possible adverse reactions/side effects for all the Medicines you take and that have been prescribed to you. Take any new Medicines after you have completely understood and accept all the possible adverse reactions/side effects.   Please note  You were cared for by a hospitalist during your hospital stay. If you have any questions about your discharge medications or the care you received while you were in the hospital after you are discharged, you can call the unit and asked to speak with the hospitalist on call if the hospitalist that took care of you is not available. Once you are discharged, your primary care physician will handle any further medical issues. Please note that NO REFILLS for any discharge medications will be authorized once you are discharged, as it is imperative that you return to your primary care physician (or establish a relationship with a primary care physician if you do not have one) for your aftercare needs so that they can reassess your need for medications and monitor your lab values.    Today   CHIEF COMPLAINT:   Chief Complaint  Patient presents with  . Loss of Consciousness    HISTORY OF PRESENT ILLNESS:  Katherine [name]  is a 73 y.o. female brought in after episode  where she was walking to the bathroom and ended up on the ground.  No tongue bite no loss of bowel or bladder function.  She did have diarrhea after the episode.   VITAL SIGNS:  Blood pressure (!) 111/50, pulse 68, temperature 97.8 F (36.6 C), temperature source Oral, resp. rate 17, height 5\' 7"  (1.702 m), weight 99 kg, SpO2 94 %.   PHYSICAL EXAMINATION:  GENERAL:  73 y.o.-year-old patient lying in the bed with no acute distress.  EYES: Pupils equal, round, reactive to light and accommodation. No scleral icterus. Extraocular muscles intact.  HEENT: Head atraumatic, normocephalic. Oropharynx and nasopharynx clear.  NECK:  Supple, no jugular venous distention. No thyroid enlargement, no tenderness.  LUNGS: Normal breath sounds bilaterally, no wheezing, rales,rhonchi or  crepitation. No use of accessory muscles of respiration.  CARDIOVASCULAR: S1, S2 normal. No murmurs, rubs, or gallops.  ABDOMEN: Soft, non-tender, non-distended. Bowel sounds present. No organomegaly or mass.  EXTREMITIES: No pedal edema, cyanosis, or clubbing.  NEUROLOGIC: Cranial nerves II through XII are intact. Muscle strength 5/5 in all extremities. Sensation intact. Gait not checked.  PSYCHIATRIC: The patient is alert and oriented x 3.  SKIN: No obvious rash, lesion, or ulcer.   DATA REVIEW:   CBC Recent Labs  Lab 03/14/19 0451  WBC 6.1  HGB 8.6*  HCT 26.8*  PLT 314    Chemistries  Recent Labs  Lab 03/13/19 1610 03/14/19 0451  NA 134* 138  K 4.3 3.4*  CL 99 110  CO2 21* 22  GLUCOSE 228* 75  BUN 56* 40*  CREATININE 2.13* 1.24*  CALCIUM 9.8 7.9*  AST 28  --   ALT 18  --   ALKPHOS 46  --   BILITOT 0.6  --      Microbiology Results  Results for orders placed or performed during the hospital encounter of 03/13/19  SARS CORONAVIRUS 2 (TAT 6-24 HRS) Nasopharyngeal Nasopharyngeal Swab     Status: None   Collection Time: 03/13/19  8:11 PM   Specimen: Nasopharyngeal Swab  Result Value Ref Range  Status   SARS Coronavirus 2 NEGATIVE NEGATIVE Final    Comment: (NOTE) SARS-CoV-2 target nucleic acids are NOT DETECTED. The SARS-CoV-2 RNA is generally detectable in upper and lower respiratory specimens during the acute phase of infection. Negative results do not preclude SARS-CoV-2 infection, do not rule out co-infections with other pathogens, and should not be used as the sole basis for treatment or other patient management decisions. Negative results must be combined with clinical observations, patient history, and epidemiological information. The expected result is Negative. Fact Sheet for Patients: SugarRoll.be Fact Sheet for Healthcare Providers: https://www.woods-mathews.com/ This test is not yet approved or cleared by the [place] FDA and  has been authorized for detection and/or diagnosis of SARS-CoV-2 by FDA under an Emergency Use Authorization (EUA). This EUA will remain  in effect (meaning this test can be used) for the duration of the COVID-19 declaration under Section 56 4(b)(1) of the Act, 21 U.S.C. section 360bbb-3(b)(1), unless the authorization is terminated or revoked sooner. Performed at [facility], [place] [address], [place], [place] [address]     RADIOLOGY:  Ct Abdomen Pelvis Wo Contrast  Result Date: 03/13/2019 CLINICAL DATA:  Lower abdominal pain, cramping, nausea without vomiting, syncopal episode, diarrhea starting today, diagnosed with bleeding ulcers on 02/26/2019, transfused 2 units of blood at that time, on blood thinners for atrial fibrillation, history hypertension, diabetes mellitus, former smoker EXAM: CT ABDOMEN AND PELVIS WITHOUT CONTRAST TECHNIQUE: Multidetector CT imaging of the abdomen and pelvis was performed following the standard protocol without IV contrast. Sagittal and coronal MPR images reconstructed from axial data set. Patient drank dilute oral contrast for exam COMPARISON:  06/25/2015  FINDINGS: Lower chest: Minimal linear subsegmental atelectasis in lingula. Lung bases otherwise clear. Hepatobiliary: Gallbladder surgically absent. Upper normal hepatic attenuation without focal mass lesion. Minimal central intrahepatic biliary dilatation, potentially physiologic. Pancreas: Fatty replacement of pancreas. Single parenchymal calcification at pancreatic head adjacent to duodenum. No distal CBD calcification or pancreatic mass. Spleen: Normal appearance Adrenals/Urinary Tract: Adrenal glands, kidneys, ureters, and bladder normal appearance Stomach/Bowel: Normal appendix. Stomach and bowel loops unremarkable for technique. Vascular/Lymphatic: Atherosclerotic calcifications of aorta and iliac arteries without aneurysm. Coronary arterial calcifications noted. No  adenopathy. Reproductive: Unremarkable uterus and ovaries Other: No free air or free fluid. No hernia or acute inflammatory process. Musculoskeletal: Osseous demineralization. IMPRESSION: No acute intra-abdominal or intrapelvic abnormalities. Aortic Atherosclerosis (ICD10-I70.0). Electronically Signed   By: Lavonia Dana M.D.   On: 03/13/2019 19:32   Ct Head Wo Contrast  Result Date: 03/13/2019 CLINICAL DATA:  Syncopal episode. EXAM: CT HEAD WITHOUT CONTRAST TECHNIQUE: Contiguous axial images were obtained from the base of the skull through the vertex without intravenous contrast. COMPARISON:  None. FINDINGS: Brain: No evidence of acute infarction, hemorrhage, hydrocephalus, extra-axial collection, or mass lesion/mass effect. Mild diffuse cerebral atrophy noted. Vascular:  No hyperdense vessel or other acute findings. Skull: No evidence of fracture or other significant bone abnormality. Sinuses/Orbits:  No acute findings. Other: None. IMPRESSION: No acute findings. Mild cerebral atrophy. Electronically Signed   By: Marlaine Hind M.D.   On: 03/13/2019 19:29   US Carotid Bilateral  Result Date: 03/14/2019 CLINICAL DATA:  73 year old female with  a history of syncope EXAM: BILATERAL CAROTID DUPLEX ULTRASOUND TECHNIQUE: Pearline Cables scale imaging, color Doppler and duplex ultrasound were performed of bilateral carotid and vertebral arteries in the neck. COMPARISON:  None. FINDINGS: Criteria: Quantification of carotid stenosis is based on velocity parameters that correlate the residual internal carotid diameter with NASCET-based stenosis levels, using the diameter of the distal internal carotid lumen as the denominator for stenosis measurement. The following velocity measurements were obtained: RIGHT ICA:  Systolic 81 cm/sec, Diastolic 14 cm/sec CCA:  81 cm/sec SYSTOLIC ICA/CCA RATIO:  1.0 ECA:  134 cm/sec LEFT ICA:  Systolic 50 cm/sec, Diastolic 13 cm/sec CCA:  83 cm/sec SYSTOLIC ICA/CCA RATIO:  0.6 ECA:  94 cm/sec Right Brachial SBP: Not acquired Left Brachial SBP: Not acquired RIGHT CAROTID ARTERY: No significant calcified disease of the right common carotid artery. Intermediate waveform maintained. Homogeneous plaque without significant calcifications at the right carotid bifurcation. Low resistance waveform of the right ICA. No significant tortuosity. RIGHT VERTEBRAL ARTERY: Antegrade flow with low resistance waveform. LEFT CAROTID ARTERY: No significant calcified disease of the left common carotid artery. Intermediate waveform maintained. Homogeneous plaque at the left carotid bifurcation without significant calcifications. Low resistance waveform of the left ICA. LEFT VERTEBRAL ARTERY:  Antegrade flow with low resistance waveform. IMPRESSION: Color duplex indicates minimal homogeneous plaque, with no hemodynamically significant stenosis by duplex criteria in the extracranial cerebrovascular circulation. Signed, Dulcy Fanny. Dellia Nims, RPVI [facility] Electronically Signed   By: Corrie Mckusick D.O.   On: 03/14/2019 10:47     Management plans discussed with the patient, and she in agreement.  I left  message for the husband on the phone.  CODE STATUS:     Code Status Orders  (From admission, onward)         Start     Ordered   03/13/19 2032  Full code  Continuous     03/13/19 2037        Code Status History    Date Active Date Inactive Code Status Order ID Comments User Context   02/24/2019 1118 02/26/2019 2025 DNR [id number]  Otila Back, MD ED   06/25/2015 2235 06/27/2015 1830 Full Code [id number]  Nicholes Mango, MD Inpatient   Advance Care Planning Activity    Advance Directive Documentation     Most Recent Value  Type of Advance Directive  Healthcare Power of Attorney  Pre-existing out of facility DNR order (yellow form or pink MOST form)  -  "MOST" Form  in [place] CARE OF THIS PATIENT: 35 minutes.    Loletha Grayer M.D on 03/14/2019 at 2:28 PM  Between 7am to 6pm - Pager - [phone]  After 6pm go to [email or web address] - password [email or web address]  [facility]  CC: Primary care physician; [name], MD

## 2019-03-14 NOTE — ED Notes (Signed)
Pt provided graham crackers at this time.

## 2019-03-14 NOTE — Progress Notes (Signed)
*  PRELIMINARY RESULTS* Echocardiogram 2D Echocardiogram has been performed.  Katherine [name] 03/14/2019, 2:17 PM

## 2019-03-14 NOTE — ED Notes (Signed)
Pt given breakfast meal tray. 

## 2019-03-15 NOTE — Progress Notes (Signed)
Patient ID: Katherine [name], female   DOB: [birth year], 73 y.o.   MRN: [id number]  Called the patient on the phone.  She is feeling better but not 100%.  No further diarrhea.  Told her that they did see trace blood on the stool specimen.  If she sees any bright red blood per rectum or black tar-like stools she will have to stop the Xarelto.  Recommend checking blood counts, chemistry and follow-up appointment.  Patient feeling better than she did in the hospital.  Dr Loletha Grayer

## 2019-03-31 ENCOUNTER — Encounter: Payer: Self-pay | Admitting: Gastroenterology

## 2019-03-31 ENCOUNTER — Other Ambulatory Visit: Payer: Self-pay

## 2019-03-31 ENCOUNTER — Ambulatory Visit (INDEPENDENT_AMBULATORY_CARE_PROVIDER_SITE_OTHER): Payer: Medicare Other | Admitting: Gastroenterology

## 2019-03-31 VITALS — BP 128/73 | HR 93 | Temp 98.1°F | Wt 225.1 lb

## 2019-03-31 DIAGNOSIS — K259 Gastric ulcer, unspecified as acute or chronic, without hemorrhage or perforation: Secondary | ICD-10-CM

## 2019-03-31 MED ORDER — PANTOPRAZOLE SODIUM 40 MG PO TBEC: 40.0000 mg | DELAYED_RELEASE_TABLET | Freq: Two times a day (BID) | ORAL | 1 refills | Status: DC

## 2019-04-01 NOTE — Progress Notes (Signed)
[name], MD [address]  [place]  [place], [place] [address]  Main: [phone]  Fax: [phone]   Primary Care Physician: [name], MD   Chief Complaint  Patient presents with   UPPER GI bleed    Has had constipation and some abdominal pain    HPI: Katherine [name] is a 73 y.o. female here for post hospitalization follow up from Oct 2020. Was having melena on Xarelto at the time and EGD showed gastric ulcers. Is on iron at home and reports having dark stools with that. States had 1-2 episodes of black stool but doesn't know if that was due to iron. No abdominal pain, N/V or red blood in stool. Is only taking PPI once a day.  Hemoglobin 8.6 on 11/2  Colonoscopy in 2017 with Dr. Thurmond Butts, for rectal bleeding, done for CT showing inflammation of the sigmoid and descending colon and diarrhea, was normal except for hemorrhoids.  Past Medical History:  Diagnosis Date   Anemia    Cancer ([place])    Cystocele    Depression    Diabetes mellitus without complication (HCC)    Glaucoma    Hyperlipidemia    Hypertension    Hypothyroidism      Current Outpatient Medications  Medication Sig Dispense Refill   amLODipine (NORVASC) 5 MG tablet Take 1 tablet (5 mg total) by mouth at bedtime. 30 tablet 0   cholecalciferol (VITAMIN D) 1000 units tablet Take 1,000 Units by mouth daily.     diazepam (VALIUM) 5 MG tablet Take 2.5-5 mg by mouth every 8 (eight) hours as needed for anxiety.      liothyronine (CYTOMEL) 25 MCG tablet Take 25 mcg by mouth 2 (two) times daily.   11   lisinopril (ZESTRIL) 20 MG tablet Take 20 mg by mouth daily.     metFORMIN (GLUCOPHAGE) 1000 MG tablet Take 1,000 mg by mouth 2 (two) times daily with a meal.      Multiple Vitamin (MULTI-VITAMINS) TABS Take 1 tablet by mouth daily.      pantoprazole (PROTONIX) 40 MG tablet Take 1 tablet (40 mg total) by mouth daily. 30 tablet 2   rivaroxaban (XARELTO) 20 MG TABS tablet Take  20 mg by mouth daily with supper.      sitaGLIPtin (JANUVIA) 100 MG tablet Take 100 mg by mouth daily.     SYNTHROID 150 MCG tablet Take 100 mcg by mouth daily.   11   traMADol (ULTRAM) 50 MG tablet Take 1 tablet (50 mg total) by mouth every 12 (twelve) hours as needed for severe pain. 15 tablet 0   pantoprazole (PROTONIX) 40 MG tablet Take 1 tablet (40 mg total) by mouth 2 (two) times daily. 60 tablet 1   No current facility-administered medications for this visit.     Allergies as of 03/31/2019 - Review Complete 03/31/2019  Allergen Reaction Noted   Lipitor [atorvastatin]  08/16/2015   Pravastatin  08/16/2015   Codeine  08/16/2015   Morphine and related  08/16/2015   Vytorin [ezetimibe-simvastatin]  08/16/2015    ROS:  General: Negative for anorexia, weight loss, fever, chills, fatigue, weakness. ENT: Negative for hoarseness, difficulty swallowing , nasal congestion. CV: Negative for chest pain, angina, palpitations, dyspnea on exertion, peripheral edema.  Respiratory: Negative for dyspnea at rest, dyspnea on exertion, cough, sputum, wheezing.  GI: See history of present illness. GU:  Negative for dysuria, hematuria, urinary incontinence, urinary frequency, nocturnal urination.  Endo: Negative for unusual weight  change.    Physical Examination:   BP 128/73 (BP Location: Left Arm, Patient Position: Sitting, Cuff Size: Normal)    Pulse 93    Temp 98.1 F (36.7 C) (Oral)    Wt 225 lb 2 oz (102.1 kg)    BMI 35.26 kg/m   General: Well-nourished, well-developed in no acute distress.  Eyes: No icterus. Conjunctivae pink. Mouth: Oropharyngeal mucosa moist and pink , no lesions erythema or exudate. Neck: Supple, Trachea midline Abdomen: Bowel sounds are normal, nontender, nondistended, no hepatosplenomegaly or masses, no abdominal bruits or hernia , no rebound or guarding.   Extremities: No lower extremity edema. No clubbing or deformities. Neuro: Alert and oriented x 3.   Grossly intact. Skin: Warm and dry, no jaundice.   Psych: Alert and cooperative, normal mood and affect.   Labs: CMP     Component Value Date/Time   NA 138 03/14/2019 0451   K 3.4 (L) 03/14/2019 0451   CL 110 03/14/2019 0451   CO2 22 03/14/2019 0451   GLUCOSE 75 03/14/2019 0451   BUN 40 (H) 03/14/2019 0451   CREATININE 1.24 (H) 03/14/2019 0451   CALCIUM 7.9 (L) 03/14/2019 0451   PROT 6.9 03/13/2019 1610   ALBUMIN 3.9 03/13/2019 1610   AST 28 03/13/2019 1610   ALT 18 03/13/2019 1610   ALKPHOS 46 03/13/2019 1610   BILITOT 0.6 03/13/2019 1610   GFRNONAA 44 (L) 03/14/2019 0451   GFRAA 51 (L) 03/14/2019 0451   Lab Results  Component Value Date   WBC 6.1 03/14/2019   HGB 8.6 (L) 03/14/2019   HCT 26.8 (L) 03/14/2019   MCV 88.4 03/14/2019   PLT 314 03/14/2019    Imaging Studies: Ct Abdomen Pelvis Wo Contrast  Result Date: 03/13/2019 CLINICAL DATA:  Lower abdominal pain, cramping, nausea without vomiting, syncopal episode, diarrhea starting today, diagnosed with bleeding ulcers on 02/26/2019, transfused 2 units of blood at that time, on blood thinners for atrial fibrillation, history hypertension, diabetes mellitus, former smoker EXAM: CT ABDOMEN AND PELVIS WITHOUT CONTRAST TECHNIQUE: Multidetector CT imaging of the abdomen and pelvis was performed following the standard protocol without IV contrast. Sagittal and coronal MPR images reconstructed from axial data set. Patient drank dilute oral contrast for exam COMPARISON:  06/25/2015 FINDINGS: Lower chest: Minimal linear subsegmental atelectasis in lingula. Lung bases otherwise clear. Hepatobiliary: Gallbladder surgically absent. Upper normal hepatic attenuation without focal mass lesion. Minimal central intrahepatic biliary dilatation, potentially physiologic. Pancreas: Fatty replacement of pancreas. Single parenchymal calcification at pancreatic head adjacent to duodenum. No distal CBD calcification or pancreatic mass. Spleen: Normal  appearance Adrenals/Urinary Tract: Adrenal glands, kidneys, ureters, and bladder normal appearance Stomach/Bowel: Normal appendix. Stomach and bowel loops unremarkable for technique. Vascular/Lymphatic: Atherosclerotic calcifications of aorta and iliac arteries without aneurysm. Coronary arterial calcifications noted. No adenopathy. Reproductive: Unremarkable uterus and ovaries Other: No free air or free fluid. No hernia or acute inflammatory process. Musculoskeletal: Osseous demineralization. IMPRESSION: No acute intra-abdominal or intrapelvic abnormalities. Aortic Atherosclerosis (ICD10-I70.0). Electronically Signed   By: Lavonia Dana M.D.   On: 03/13/2019 19:32   Ct Head Wo Contrast  Result Date: 03/13/2019 CLINICAL DATA:  Syncopal episode. EXAM: CT HEAD WITHOUT CONTRAST TECHNIQUE: Contiguous axial images were obtained from the base of the skull through the vertex without intravenous contrast. COMPARISON:  None. FINDINGS: Brain: No evidence of acute infarction, hemorrhage, hydrocephalus, extra-axial collection, or mass lesion/mass effect. Mild diffuse cerebral atrophy noted. Vascular:  No hyperdense vessel or other acute findings. Skull: No evidence of fracture  or other significant bone abnormality. Sinuses/Orbits:  No acute findings. Other: None. IMPRESSION: No acute findings. Mild cerebral atrophy. Electronically Signed   By: Marlaine Hind M.D.   On: 03/13/2019 19:29   US Carotid Bilateral  Result Date: 03/14/2019 CLINICAL DATA:  73 year old female with a history of syncope EXAM: BILATERAL CAROTID DUPLEX ULTRASOUND TECHNIQUE: Pearline Cables scale imaging, color Doppler and duplex ultrasound were performed of bilateral carotid and vertebral arteries in the neck. COMPARISON:  None. FINDINGS: Criteria: Quantification of carotid stenosis is based on velocity parameters that correlate the residual internal carotid diameter with NASCET-based stenosis levels, using the diameter of the distal internal carotid lumen as the  denominator for stenosis measurement. The following velocity measurements were obtained: RIGHT ICA:  Systolic 81 cm/sec, Diastolic 14 cm/sec CCA:  81 cm/sec SYSTOLIC ICA/CCA RATIO:  1.0 ECA:  134 cm/sec LEFT ICA:  Systolic 50 cm/sec, Diastolic 13 cm/sec CCA:  83 cm/sec SYSTOLIC ICA/CCA RATIO:  0.6 ECA:  94 cm/sec Right Brachial SBP: Not acquired Left Brachial SBP: Not acquired RIGHT CAROTID ARTERY: No significant calcified disease of the right common carotid artery. Intermediate waveform maintained. Homogeneous plaque without significant calcifications at the right carotid bifurcation. Low resistance waveform of the right ICA. No significant tortuosity. RIGHT VERTEBRAL ARTERY: Antegrade flow with low resistance waveform. LEFT CAROTID ARTERY: No significant calcified disease of the left common carotid artery. Intermediate waveform maintained. Homogeneous plaque at the left carotid bifurcation without significant calcifications. Low resistance waveform of the left ICA. LEFT VERTEBRAL ARTERY:  Antegrade flow with low resistance waveform. IMPRESSION: Color duplex indicates minimal homogeneous plaque, with no hemodynamically significant stenosis by duplex criteria in the extracranial cerebrovascular circulation. Signed, Dulcy Fanny. Dellia Nims, RPVI [facility] Electronically Signed   By: Corrie Mckusick D.O.   On: 03/14/2019 10:47    Assessment and Plan:   Katherine [name] is a 73 y.o. y/o female with history of gastric ulcers on Oct 2020 EGD with ongoing anemia here for follow up  Pt not on appropriate BID PPI dosing after hospital discharge Increase PPI to BID  Avoid NSAID use such as Ibuprofen, Aleeve, advil, motrin, BC and Goodie powder, Naproxen, Meloxicam and others.   Repeat EGD to reassess ulcers and if negative pt may need EGD in the future  Intermittent dark stool likely due to iron as is not occurring daily. If stool color changes or dark  stool starts to look melanotic (this was described to pt so she understands what to look for), pt advised to come to the ER  (Risks of PPI use were discussed with patient including bone loss, C. Diff diarrhea, pneumonia, infections, CKD, electrolyte abnormalities.  If clinically possible based on symptoms, goal would be to maintain patient on the lowest dose possible, or discontinue the medication with institution of acid reflux lifestyle modifications over time. Pt. Verbalizes understanding and chooses to continue the medication.)      Dr [name]

## 2019-04-04 ENCOUNTER — Telehealth: Payer: Self-pay

## 2019-04-04 NOTE — Telephone Encounter (Signed)
We received blood thinner request back and patient needs to stop the Xarelto 2 days prior to procedure and restart it 1 day after procedure. Called and informed patient and patient verbalized understanding

## 2019-04-08 ENCOUNTER — Other Ambulatory Visit
Admission: RE | Admit: 2019-04-08 | Discharge: 2019-04-08 | Disposition: A | Discharge status: Home / Self Care | Payer: Medicare Other | Source: Ambulatory Visit | Attending: Gastroenterology | Admitting: Gastroenterology

## 2019-04-08 DIAGNOSIS — Z20828 Contact with and (suspected) exposure to other viral communicable diseases: Secondary | ICD-10-CM | POA: Insufficient documentation

## 2019-04-08 DIAGNOSIS — Z01812 Encounter for preprocedural laboratory examination: Secondary | ICD-10-CM | POA: Diagnosis present

## 2019-04-08 LAB — SARS CORONAVIRUS 2 (TAT 6-24 HRS): SARS Coronavirus 2: NEGATIVE

## 2019-04-11 ENCOUNTER — Other Ambulatory Visit: Payer: Medicare Other

## 2019-04-13 ENCOUNTER — Ambulatory Visit: Payer: Medicare Other | Admitting: Anesthesiology

## 2019-04-13 ENCOUNTER — Encounter
Admission: RE | Disposition: A | Discharge status: Home / Self Care | Payer: Self-pay | Source: Home / Self Care | Attending: Gastroenterology

## 2019-04-13 ENCOUNTER — Ambulatory Visit
Admission: RE | Admit: 2019-04-13 | Discharge: 2019-04-13 | Disposition: A | Discharge status: Home / Self Care | Payer: Medicare Other | Attending: Gastroenterology | Admitting: Gastroenterology

## 2019-04-13 ENCOUNTER — Other Ambulatory Visit: Payer: Self-pay

## 2019-04-13 DIAGNOSIS — Z79899 Other long term (current) drug therapy: Secondary | ICD-10-CM | POA: Diagnosis not present

## 2019-04-13 DIAGNOSIS — E039 Hypothyroidism, unspecified: Secondary | ICD-10-CM | POA: Insufficient documentation

## 2019-04-13 DIAGNOSIS — K3189 Other diseases of stomach and duodenum: Secondary | ICD-10-CM

## 2019-04-13 DIAGNOSIS — K222 Esophageal obstruction: Secondary | ICD-10-CM | POA: Insufficient documentation

## 2019-04-13 DIAGNOSIS — Z79890 Hormone replacement therapy (postmenopausal): Secondary | ICD-10-CM | POA: Insufficient documentation

## 2019-04-13 DIAGNOSIS — K219 Gastro-esophageal reflux disease without esophagitis: Secondary | ICD-10-CM | POA: Diagnosis not present

## 2019-04-13 DIAGNOSIS — Z7984 Long term (current) use of oral hypoglycemic drugs: Secondary | ICD-10-CM | POA: Insufficient documentation

## 2019-04-13 DIAGNOSIS — I10 Essential (primary) hypertension: Secondary | ICD-10-CM | POA: Insufficient documentation

## 2019-04-13 DIAGNOSIS — Z7901 Long term (current) use of anticoagulants: Secondary | ICD-10-CM | POA: Diagnosis not present

## 2019-04-13 DIAGNOSIS — E119 Type 2 diabetes mellitus without complications: Secondary | ICD-10-CM | POA: Insufficient documentation

## 2019-04-13 DIAGNOSIS — Z87891 Personal history of nicotine dependence: Secondary | ICD-10-CM | POA: Insufficient documentation

## 2019-04-13 DIAGNOSIS — K259 Gastric ulcer, unspecified as acute or chronic, without hemorrhage or perforation: Secondary | ICD-10-CM

## 2019-04-13 DIAGNOSIS — I4891 Unspecified atrial fibrillation: Secondary | ICD-10-CM | POA: Insufficient documentation

## 2019-04-13 DIAGNOSIS — D500 Iron deficiency anemia secondary to blood loss (chronic): Secondary | ICD-10-CM | POA: Insufficient documentation

## 2019-04-13 DIAGNOSIS — K319 Disease of stomach and duodenum, unspecified: Secondary | ICD-10-CM | POA: Insufficient documentation

## 2019-04-13 DIAGNOSIS — K921 Melena: Secondary | ICD-10-CM | POA: Insufficient documentation

## 2019-04-13 HISTORY — PX: ESOPHAGOGASTRODUODENOSCOPY (EGD) WITH PROPOFOL: SHX5813

## 2019-04-13 SURGERY — ESOPHAGOGASTRODUODENOSCOPY (EGD) WITH PROPOFOL
Anesthesia: General

## 2019-04-13 MED ORDER — PROPOFOL 500 MG/50ML IV EMUL
INTRAVENOUS | Status: AC
  Filled 2019-04-13: qty 50

## 2019-04-13 MED ORDER — SODIUM CHLORIDE 0.9 % IV SOLN
INTRAVENOUS | Status: DC
  Administered 2019-04-13: 09:00:00 via INTRAVENOUS

## 2019-04-13 MED ORDER — LIDOCAINE 2% (20 MG/ML) 5 ML SYRINGE
INTRAMUSCULAR | Status: DC | PRN
  Administered 2019-04-13: 10 mg via INTRAVENOUS

## 2019-04-13 MED ORDER — PROPOFOL 500 MG/50ML IV EMUL
INTRAVENOUS | Status: DC | PRN
  Administered 2019-04-13: 200 ug/kg/min via INTRAVENOUS

## 2019-04-13 MED ORDER — GLYCOPYRROLATE 0.2 MG/ML IJ SOLN
INTRAMUSCULAR | Status: DC | PRN
  Administered 2019-04-13: 0.2 mg via INTRAVENOUS

## 2019-04-13 MED ORDER — PROPOFOL 10 MG/ML IV BOLUS
INTRAVENOUS | Status: DC | PRN
  Administered 2019-04-13: 80 mg via INTRAVENOUS
  Administered 2019-04-13: 20 mg via INTRAVENOUS

## 2019-04-13 NOTE — Anesthesia Preprocedure Evaluation (Signed)
Anesthesia Evaluation  Patient identified by MRN, date of birth, ID band Patient awake    Reviewed: Allergy & Precautions, NPO status , Patient's Chart, lab work & pertinent test results  History of Anesthesia Complications Negative for: history of anesthetic complications  Airway Mallampati: III       Dental   Pulmonary neg sleep apnea, neg COPD, Not current smoker, former smoker,           Cardiovascular hypertension, Pt. on medications (-) Past MI and (-) CHF + dysrhythmias Atrial Fibrillation (-) Valvular Problems/Murmurs     Neuro/Psych neg Seizures Depression    GI/Hepatic Neg liver ROS, GERD  Medicated and Controlled,  Endo/Other  diabetes, Type 2, Oral Hypoglycemic AgentsHypothyroidism   Renal/GU negative Renal ROS     Musculoskeletal   Abdominal   Peds  Hematology   Anesthesia Other Findings   Reproductive/Obstetrics                             Anesthesia Physical Anesthesia Plan  ASA: III  Anesthesia Plan: General   Post-op Pain Management:    Induction: Intravenous  PONV Risk Score and Plan: 3 and Propofol infusion, TIVA and Treatment may vary due to age or medical condition  Airway Management Planned: Nasal Cannula  Additional Equipment:   Intra-op Plan:   Post-operative Plan:   Informed Consent: I have reviewed the patients History and Physical, chart, labs and discussed the procedure including the risks, benefits and alternatives for the proposed anesthesia with the patient or authorized representative who has indicated his/her understanding and acceptance.       Plan Discussed with:   Anesthesia Plan Comments:         Anesthesia Quick Evaluation

## 2019-04-13 NOTE — Transfer of Care (Signed)
Immediate Anesthesia Transfer of Care Note  Patient: Katherine [name]  Procedure(s) Performed: ESOPHAGOGASTRODUODENOSCOPY (EGD) WITH PROPOFOL (N/A )  Patient Location: Endoscopy Unit  Anesthesia Type:General  Level of Consciousness: awake, alert  and oriented  Airway & Oxygen Therapy: Patient Spontanous Breathing  Post-op Assessment: Post -op Vital signs reviewed and stable  Post vital signs: stable  Last Vitals:  Vitals Value Taken Time  BP 111/68 04/13/19 0914  Temp 36.5 C 04/13/19 0911  Pulse 84 04/13/19 0914  Resp 16 04/13/19 0914  SpO2 96 % 04/13/19 0914    Last Pain:  Vitals:   04/13/19 0911  TempSrc: Temporal  PainSc: 0-No pain         Complications: No apparent anesthesia complications

## 2019-04-13 NOTE — Anesthesia Postprocedure Evaluation (Signed)
Anesthesia Post Note  Patient: Katherine [name]  Procedure(s) Performed: ESOPHAGOGASTRODUODENOSCOPY (EGD) WITH PROPOFOL (N/A )  Patient location during evaluation: Endoscopy Anesthesia Type: General Level of consciousness: awake and alert Pain management: pain level controlled Vital Signs Assessment: post-procedure vital signs reviewed and stable Respiratory status: spontaneous breathing and respiratory function stable Cardiovascular status: stable Anesthetic complications: no     Last Vitals:  Vitals:   04/13/19 0911 04/13/19 0914  BP: 111/68 111/68  Pulse:  84  Resp:  16  Temp: 36.5 C   SpO2: 98% 96%    Last Pain:  Vitals:   04/13/19 0921  TempSrc:   PainSc: 0-No pain                 [name] K

## 2019-04-13 NOTE — Op Note (Signed)
[facility]astroenterology Patient Name: Katherine [name] Procedure Date: 04/13/2019 8:47 AM MRN: [id number] Account #: [id number] Date of Birth: [birth year] Admit Type: Outpatient Age: 73 Room: [facility] ENDO ROOM 2 Gender: Female Note Status: Finalized Procedure:             Upper GI endoscopy Indications:           Iron deficiency anemia secondary to chronic blood loss Providers:             Varnita B. Bonna Gains MD, MD Medicines:             Monitored Anesthesia Care Complications:         No immediate complications. Procedure:             Pre-Anesthesia Assessment:                        - Prior to the procedure, a History and Physical was                         performed, and patient medications, allergies and                         sensitivities were reviewed. The patient's tolerance                         of previous anesthesia was reviewed.                        - The risks and benefits of the procedure and the                         sedation options and risks were discussed with the                         patient. All questions were answered and informed                         consent was obtained.                        - Patient identification and proposed procedure were                         verified prior to the procedure by the physician, the                         nurse, the anesthesiologist, the anesthetist and the                         technician. The procedure was verified in the                         procedure room.                        - ASA Grade Assessment: II - A patient with mild                         systemic disease.  After obtaining informed consent, the endoscope was                         passed under direct vision. Throughout the procedure,                         the patient's blood pressure, pulse, and oxygen                         saturations were monitored continuously. The Endoscope                 was introduced through the mouth, and advanced to the                         third part of duodenum. The upper GI endoscopy was                         accomplished with ease. The patient tolerated the                         procedure well. Findings:      The examined esophagus was normal.      A widely patent Schatzki ring was found at the gastroesophageal junction.      Patchy mildly erythematous mucosa without bleeding was found in the       gastric antrum. Biopsies were taken with a cold forceps for histology.       Biopsies were obtained in the gastric body, at the incisura and in the       gastric antrum with cold forceps for histology.      Patchy granular mucosa was found in the second portion of the duodenum.       Biopsies were obtained in the duodenal bulb and in the second portion of       the duodenum with cold forceps for histology.      The exam of the duodenum was otherwise normal. Impression:            - Normal esophagus.                        - Widely patent Schatzki ring.                        - Erythematous mucosa in the antrum. Biopsied.                        - Granular mucosa in the second portion of the                         duodenum.                        - Biopsies were obtained in the gastric body, at the                         incisura and in the gastric antrum.                        - Biopsies were obtained in the duodenal bulb and in  the second portion of the duodenum. Recommendation:        - Await pathology results.                        - Discharge patient to home (with escort).                        - Advance diet as tolerated.                        - Continue present medications.                        - Patient has a contact number available for                         emergencies. The signs and symptoms of potential                         delayed complications were discussed with the patient.                          Return to normal activities tomorrow. Written                         discharge instructions were provided to the patient.                        - Discharge patient to home (with escort).                        - The findings and recommendations were discussed with                         the patient.                        - The findings and recommendations were discussed with                         the patient's family. Procedure Code(s):     --- Professional ---                        [phone], Esophagogastroduodenoscopy, flexible,                         transoral; with biopsy, single or multiple Diagnosis Code(s):     --- Professional ---                        K22.2, Esophageal obstruction                        K31.89, Other diseases of stomach and duodenum                        D50.0, Iron deficiency anemia secondary to blood loss                         (chronic) CPT copyright 2019 [employer]. All rights reserved. The codes  documented in this report are preliminary and upon coder review may  be revised to meet current compliance requirements.  [name], MD Margretta Sidle B. Bonna Gains MD, MD 04/13/2019 9:16:34 AM This report has been signed electronically. Number of Addenda: 0 Note Initiated On: 04/13/2019 8:47 AM Estimated Blood Loss:  Estimated blood loss: none.      [facility]

## 2019-04-13 NOTE — H&P (Signed)
[name], MD [address] [place], [place], [place], [address] [address], [place], [place], [place], [address] Phone: [phone]  Fax: [phone]  Primary Care Physician:  [name], MD   Pre-Procedure History & Physical: HPI:  Katherine [name] is a 73 y.o. female is here for an EGD.   Past Medical History:  Diagnosis Date  . Anemia   . Cancer ([place])   . Cystocele   . Depression   . Diabetes mellitus without complication ([place])   . Glaucoma   . Hyperlipidemia   . Hypertension   . Hypothyroidism     Past Surgical History:  Procedure Laterality Date  . BREAST EXCISIONAL BIOPSY Left 07/2012  . CHOLECYSTECTOMY    . COLONOSCOPY WITH PROPOFOL N/A 08/17/2015   Procedure: COLONOSCOPY WITH PROPOFOL;  Surgeon: Josefine Class, MD;  Location: [facility] ENDOSCOPY;  Service: Endoscopy;  Laterality: N/A;  . ESOPHAGOGASTRODUODENOSCOPY N/A 02/25/2019   Procedure: ESOPHAGOGASTRODUODENOSCOPY (EGD);  Surgeon: Virgel Manifold, MD;  Location: [facility] ENDOSCOPY;  Service: Endoscopy;  Laterality: N/A;    Prior to Admission medications   Medication Sig Start Date End Date Taking? Authorizing Provider  amLODipine (NORVASC) 5 MG tablet Take 1 tablet (5 mg total) by mouth at bedtime. 03/14/19  Yes Wieting, Richard, MD  cholecalciferol (VITAMIN D) 1000 units tablet Take 1,000 Units by mouth daily.   Yes [provider]  diazepam (VALIUM) 5 MG tablet Take 2.5-5 mg by mouth every 8 (eight) hours as needed for anxiety.    Yes [provider]  liothyronine (CYTOMEL) 25 MCG tablet Take 25 mcg by mouth 2 (two) times daily.    Yes [provider]  lisinopril (ZESTRIL) 20 MG tablet Take 20 mg by mouth daily. 03/30/19  Yes [provider]  metFORMIN (GLUCOPHAGE) 1000 MG tablet Take 1,000 mg by mouth 2 (two) times daily with a meal.    Yes [provider]  Multiple Vitamin (MULTI-VITAMINS) TABS Take 1 tablet by mouth daily.    Yes [provider]  pantoprazole (PROTONIX) 40 MG tablet Take 1 tablet (40 mg total) by mouth daily. 02/27/19  Yes Fritzi Mandes, MD  pantoprazole (PROTONIX) 40 MG tablet Take 1 tablet (40 mg total) by mouth 2 (two) times daily. 03/31/19  Yes [name] B, MD  sitaGLIPtin (JANUVIA) 100 MG tablet Take 100 mg by mouth daily.   Yes [provider]  SYNTHROID 150 MCG tablet Take 100 mcg by mouth daily.  03/11/18  Yes [provider]  traMADol (ULTRAM) 50 MG tablet Take 1 tablet (50 mg total) by mouth every 12 (twelve) hours as needed for severe pain. 02/26/19  Yes Fritzi Mandes, MD  rivaroxaban (XARELTO) 20 MG TABS tablet Take 20 mg by mouth daily with supper.  03/20/15   [provider]    Allergies as of 04/01/2019 - Review Complete 03/31/2019  Allergen Reaction Noted  . Lipitor [atorvastatin]  08/16/2015  . Pravastatin  08/16/2015  . Codeine  08/16/2015  . Morphine and related  08/16/2015  . Vytorin [ezetimibe-simvastatin]  08/16/2015    Family History  Problem Relation Age of Onset  . Diabetes Mother   . Diabetes Father   . Diabetes Brother     Social History   Socioeconomic History  . Marital status: Married    Spouse name: Not on file  . Number of children: Not on file  . Years of education: Not on file  . Highest education level: Not on file  Occupational  History  . Not on file  Social Needs  . Financial resource strain: Not on file  . Food insecurity    Worry: Not on file    Inability: Not on file  . Transportation needs    Medical: Not on file    Non-medical: Not on file  Tobacco Use  . Smoking status: Former Smoker    Packs/day: 1.00    Years: 8.00    Pack years: 8.00    Types: Cigarettes    Quit date: 05/12/1976    Years since quitting: 42.9  . Smokeless tobacco: Never Used  Substance and Sexual Activity  . Alcohol use: Yes    Alcohol/week: 0.0 - 2.0 standard drinks    Comment: occasional, not every week  . Drug use: No  . Sexual  activity: Not on file  Lifestyle  . Physical activity    Days per week: Not on file    Minutes per session: Not on file  . Stress: Not on file  Relationships  . Social Herbalist on phone: Not on file    Gets together: Not on file    Attends religious service: Not on file    Active member of club or organization: Not on file    Attends meetings of clubs or organizations: Not on file    Relationship status: Not on file  . Intimate partner violence    Fear of current or ex partner: Not on file    Emotionally abused: Not on file    Physically abused: Not on file    Forced sexual activity: Not on file  Other Topics Concern  . Not on file  Social History Narrative  . Not on file    Review of Systems: See HPI, otherwise negative ROS  Physical Exam: BP 129/66   Pulse 77   Temp (!) 96.7 F (35.9 C) (Temporal)   Resp 17   Ht 5\' 7"  (1.702 m)   Wt 100.7 kg   SpO2 100%   BMI 34.77 kg/m  General:   Alert,  pleasant and cooperative in NAD Head:  Normocephalic and atraumatic. Neck:  Supple; no masses or thyromegaly. Lungs:  Clear throughout to auscultation, normal respiratory effort.    Heart:  +S1, +S2, Regular rate and rhythm, No edema. Abdomen:  Soft, nontender and nondistended. Normal bowel sounds, without guarding, and without rebound.   Neurologic:  Alert and  oriented x4;  grossly normal neurologically.  Impression/Plan: Katherine [name] is here for an EGD for melena  Risks, benefits, limitations, and alternatives regarding the procedure have been reviewed with the patient.  Questions have been answered.  All parties agreeable.   Virgel Manifold, MD  04/13/2019, 8:47 AM

## 2019-04-13 NOTE — Anesthesia Post-op Follow-up Note (Signed)
Anesthesia QCDR form completed.        

## 2019-04-14 ENCOUNTER — Encounter: Payer: Self-pay | Admitting: Gastroenterology

## 2019-04-14 LAB — SURGICAL PATHOLOGY

## 2019-04-25 ENCOUNTER — Other Ambulatory Visit: Payer: Self-pay | Admitting: Gastroenterology

## 2019-04-25 ENCOUNTER — Telehealth: Payer: Self-pay

## 2019-04-25 DIAGNOSIS — D649 Anemia, unspecified: Secondary | ICD-10-CM

## 2019-04-25 NOTE — Telephone Encounter (Signed)
Patient verbalized understanding. She states she will come by tomorrow to get lab work

## 2019-04-25 NOTE — Telephone Encounter (Signed)
-----   Message from Virgel Manifold, MD sent at 04/25/2019  2:46 PM EST ----- Katherine [name] please let the patient know, her biopsies do not explain the reason for her anemia.  I have ordered a repeat hemoglobin since her last check was a month ago, to see if it is improving.  If it is improving, that is good.  If not, we may need to consider colonoscopy.  Please ask her to get this done.

## 2019-04-25 NOTE — Telephone Encounter (Signed)
Called patient on home phone number unable to leave a message number just kept ringing. Called patient on mobile number and left a message for call back

## 2019-04-26 ENCOUNTER — Other Ambulatory Visit: Payer: Self-pay

## 2019-04-26 ENCOUNTER — Other Ambulatory Visit: Payer: Self-pay | Admitting: Gastroenterology

## 2019-04-26 DIAGNOSIS — D649 Anemia, unspecified: Secondary | ICD-10-CM

## 2019-04-26 NOTE — Telephone Encounter (Signed)
Last office visit 03/31/2019 Multiple gastric ulcers  Last refill 03/31/2019 1 refills

## 2019-04-27 ENCOUNTER — Telehealth: Payer: Self-pay

## 2019-04-27 LAB — HEMOGLOBIN: Hemoglobin: 10.2 g/dL — ABNORMAL LOW (ref 11.1–15.9)

## 2019-04-27 NOTE — Telephone Encounter (Signed)
Patient verbalized understanding  

## 2019-04-27 NOTE — Telephone Encounter (Signed)
-----   Message from Virgel Manifold, MD sent at 04/27/2019 12:41 PM EST ----- Caryl Pina please let the patient know, her hemoglobin has improved. continue iron replacement

## 2019-05-25 ENCOUNTER — Other Ambulatory Visit: Payer: Self-pay | Admitting: Gastroenterology

## 2019-05-25 NOTE — Telephone Encounter (Signed)
Last refill 03/31/2019 1 refills  Last office visit 03/31/2019 No appointment is scheduled

## 2019-06-16 ENCOUNTER — Other Ambulatory Visit: Payer: Self-pay | Admitting: Gastroenterology

## 2019-07-12 ENCOUNTER — Other Ambulatory Visit: Payer: Self-pay | Admitting: Gastroenterology

## 2021-04-30 ENCOUNTER — Other Ambulatory Visit: Payer: Self-pay

## 2021-04-30 ENCOUNTER — Inpatient Hospital Stay: Payer: Medicare PPO

## 2021-04-30 ENCOUNTER — Inpatient Hospital Stay
Admission: EM | Admit: 2021-04-30 | Discharge: 2021-05-03 | DRG: 378 | Disposition: A | Discharge status: Home / Self Care | Payer: Medicare PPO | Attending: Internal Medicine | Admitting: Internal Medicine

## 2021-04-30 ENCOUNTER — Emergency Department: Payer: Medicare PPO

## 2021-04-30 DIAGNOSIS — K922 Gastrointestinal hemorrhage, unspecified: Secondary | ICD-10-CM | POA: Diagnosis not present

## 2021-04-30 DIAGNOSIS — Z9049 Acquired absence of other specified parts of digestive tract: Secondary | ICD-10-CM

## 2021-04-30 DIAGNOSIS — Z6838 Body mass index (BMI) 38.0-38.9, adult: Secondary | ICD-10-CM

## 2021-04-30 DIAGNOSIS — M5116 Intervertebral disc disorders with radiculopathy, lumbar region: Secondary | ICD-10-CM | POA: Diagnosis present

## 2021-04-30 DIAGNOSIS — I10 Essential (primary) hypertension: Secondary | ICD-10-CM | POA: Diagnosis present

## 2021-04-30 DIAGNOSIS — Z7984 Long term (current) use of oral hypoglycemic drugs: Secondary | ICD-10-CM | POA: Diagnosis not present

## 2021-04-30 DIAGNOSIS — Z79890 Hormone replacement therapy (postmenopausal): Secondary | ICD-10-CM | POA: Diagnosis not present

## 2021-04-30 DIAGNOSIS — K254 Chronic or unspecified gastric ulcer with hemorrhage: Secondary | ICD-10-CM | POA: Diagnosis present

## 2021-04-30 DIAGNOSIS — E119 Type 2 diabetes mellitus without complications: Secondary | ICD-10-CM

## 2021-04-30 DIAGNOSIS — D649 Anemia, unspecified: Secondary | ICD-10-CM

## 2021-04-30 DIAGNOSIS — R1013 Epigastric pain: Secondary | ICD-10-CM | POA: Diagnosis present

## 2021-04-30 DIAGNOSIS — M549 Dorsalgia, unspecified: Secondary | ICD-10-CM

## 2021-04-30 DIAGNOSIS — R791 Abnormal coagulation profile: Secondary | ICD-10-CM | POA: Diagnosis present

## 2021-04-30 DIAGNOSIS — Z79899 Other long term (current) drug therapy: Secondary | ICD-10-CM

## 2021-04-30 DIAGNOSIS — N179 Acute kidney failure, unspecified: Secondary | ICD-10-CM | POA: Diagnosis present

## 2021-04-30 DIAGNOSIS — I447 Left bundle-branch block, unspecified: Secondary | ICD-10-CM | POA: Diagnosis present

## 2021-04-30 DIAGNOSIS — E039 Hypothyroidism, unspecified: Secondary | ICD-10-CM | POA: Diagnosis present

## 2021-04-30 DIAGNOSIS — E538 Deficiency of other specified B group vitamins: Secondary | ICD-10-CM | POA: Diagnosis present

## 2021-04-30 DIAGNOSIS — E785 Hyperlipidemia, unspecified: Secondary | ICD-10-CM | POA: Diagnosis present

## 2021-04-30 DIAGNOSIS — Z20822 Contact with and (suspected) exposure to covid-19: Secondary | ICD-10-CM | POA: Diagnosis present

## 2021-04-30 DIAGNOSIS — K921 Melena: Secondary | ICD-10-CM | POA: Diagnosis not present

## 2021-04-30 DIAGNOSIS — Z87891 Personal history of nicotine dependence: Secondary | ICD-10-CM | POA: Diagnosis not present

## 2021-04-30 DIAGNOSIS — K253 Acute gastric ulcer without hemorrhage or perforation: Secondary | ICD-10-CM

## 2021-04-30 DIAGNOSIS — Z833 Family history of diabetes mellitus: Secondary | ICD-10-CM | POA: Diagnosis not present

## 2021-04-30 DIAGNOSIS — D62 Acute posthemorrhagic anemia: Secondary | ICD-10-CM

## 2021-04-30 DIAGNOSIS — Z7901 Long term (current) use of anticoagulants: Secondary | ICD-10-CM

## 2021-04-30 DIAGNOSIS — E669 Obesity, unspecified: Secondary | ICD-10-CM | POA: Diagnosis present

## 2021-04-30 DIAGNOSIS — K264 Chronic or unspecified duodenal ulcer with hemorrhage: Secondary | ICD-10-CM | POA: Diagnosis present

## 2021-04-30 DIAGNOSIS — T39395A Adverse effect of other nonsteroidal anti-inflammatory drugs [NSAID], initial encounter: Secondary | ICD-10-CM | POA: Diagnosis present

## 2021-04-30 DIAGNOSIS — D500 Iron deficiency anemia secondary to blood loss (chronic): Secondary | ICD-10-CM | POA: Diagnosis not present

## 2021-04-30 DIAGNOSIS — I4821 Permanent atrial fibrillation: Secondary | ICD-10-CM | POA: Diagnosis present

## 2021-04-30 LAB — CBC WITH DIFFERENTIAL/PLATELET
Abs Immature Granulocytes: 0.03 10*3/uL (ref 0.00–0.07)
Basophils Absolute: 0.1 10*3/uL (ref 0.0–0.1)
Basophils Relative: 1 %
Eosinophils Absolute: 0 10*3/uL (ref 0.0–0.5)
Eosinophils Relative: 1 %
HCT: 22.2 % — ABNORMAL LOW (ref 36.0–46.0)
Hemoglobin: 7.1 g/dL — ABNORMAL LOW (ref 12.0–15.0)
Immature Granulocytes: 1 %
Lymphocytes Relative: 21 %
Lymphs Abs: 1.2 10*3/uL (ref 0.7–4.0)
MCH: 26 pg (ref 26.0–34.0)
MCHC: 32 g/dL (ref 30.0–36.0)
MCV: 81.3 fL (ref 80.0–100.0)
Monocytes Absolute: 0.3 10*3/uL (ref 0.1–1.0)
Monocytes Relative: 5 %
Neutro Abs: 4 10*3/uL (ref 1.7–7.7)
Neutrophils Relative %: 71 %
Platelets: 272 10*3/uL (ref 150–400)
RBC: 2.73 MIL/uL — ABNORMAL LOW (ref 3.87–5.11)
RDW: 19.7 % — ABNORMAL HIGH (ref 11.5–15.5)
WBC: 5.6 10*3/uL (ref 4.0–10.5)
nRBC: 0 % (ref 0.0–0.2)

## 2021-04-30 LAB — COMPREHENSIVE METABOLIC PANEL
ALT: 10 U/L (ref 0–44)
AST: 15 U/L (ref 15–41)
Albumin: 3.1 g/dL — ABNORMAL LOW (ref 3.5–5.0)
Alkaline Phosphatase: 59 U/L (ref 38–126)
Anion gap: 7 (ref 5–15)
BUN: 75 mg/dL — ABNORMAL HIGH (ref 8–23)
CO2: 24 mmol/L (ref 22–32)
Calcium: 9.6 mg/dL (ref 8.9–10.3)
Chloride: 105 mmol/L (ref 98–111)
Creatinine, Ser: 1.48 mg/dL — ABNORMAL HIGH (ref 0.44–1.00)
GFR, Estimated: 37 mL/min — ABNORMAL LOW (ref >60)
Glucose, Bld: 247 mg/dL — ABNORMAL HIGH (ref 70–99)
Potassium: 4.1 mmol/L (ref 3.5–5.1)
Sodium: 136 mmol/L (ref 135–145)
Total Bilirubin: 0.7 mg/dL (ref 0.3–1.2)
Total Protein: 5.9 g/dL — ABNORMAL LOW (ref 6.5–8.1)

## 2021-04-30 LAB — PREPARE RBC (CROSSMATCH)

## 2021-04-30 LAB — LACTIC ACID, PLASMA
Lactic Acid, Venous: 2 mmol/L (ref 0.5–1.9)
Lactic Acid, Venous: 1.4 mmol/L (ref 0.5–1.9)

## 2021-04-30 LAB — IRON AND TIBC
Iron: 30 ug/dL (ref 28–170)
Saturation Ratios: 8 % — ABNORMAL LOW (ref 10.4–31.8)
TIBC: 360 ug/dL (ref 250–450)
UIBC: 330 ug/dL

## 2021-04-30 LAB — CBG MONITORING, ED: Glucose-Capillary: 166 mg/dL — ABNORMAL HIGH (ref 70–99)

## 2021-04-30 LAB — LIPASE, BLOOD: Lipase: 27 U/L (ref 11–51)

## 2021-04-30 LAB — TROPONIN I (HIGH SENSITIVITY)
Troponin I (High Sensitivity): 14 ng/L (ref <18)
Troponin I (High Sensitivity): 17 ng/L (ref <18)

## 2021-04-30 LAB — RESP PANEL BY RT-PCR (FLU A&B, COVID) ARPGX2
Influenza A by PCR: NEGATIVE
Influenza B by PCR: NEGATIVE
SARS Coronavirus 2 by RT PCR: NEGATIVE

## 2021-04-30 LAB — FERRITIN: Ferritin: 5 ng/mL — ABNORMAL LOW (ref 11–307)

## 2021-04-30 LAB — FOLATE: Folate: 14.6 ng/mL (ref >5.9)

## 2021-04-30 LAB — VITAMIN B12: Vitamin B-12: 168 pg/mL — ABNORMAL LOW (ref 180–914)

## 2021-04-30 IMAGING — MR MR LUMBAR SPINE W/O CM
4 of 5 series · 29 of 48 positions shown · non-contrast
Comparison: Prior CT from earlier the same day.

CLINICAL DATA: Initial evaluation for acute mid back pain,
compression fracture.

EXAM:
MRI THORACIC AND LUMBAR SPINE WITHOUT CONTRAST
TECHNIQUE: Multiplanar and multiecho pulse sequences of the thoracic and lumbar
spine were obtained without intravenous contrast.

[Series 16: T2 · sagittal · 4.0mm · 0.81mm/px · 6 of 17 slices shown (1 of 2)]
[im 1/17]
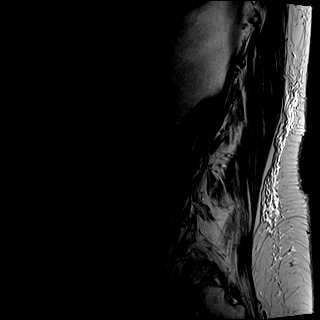
[im 4/17]
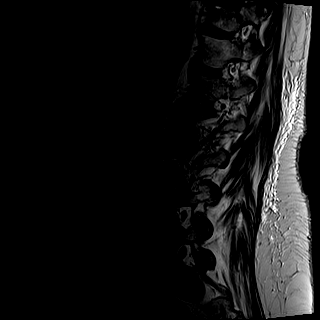
[im 7/17]
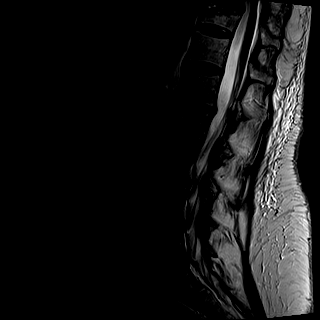
[im 10/17]
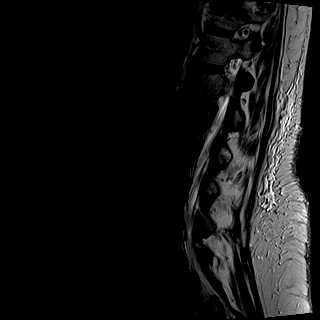
[im 13/17]
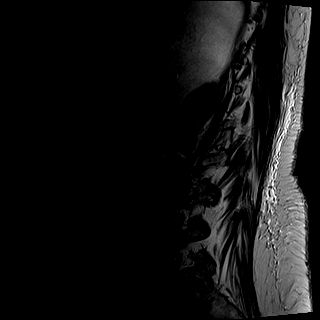
[im 17/17]
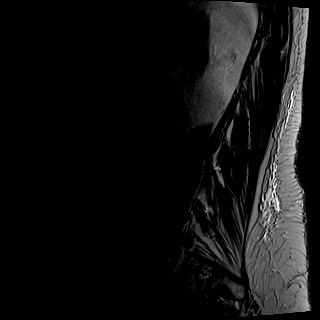

[Series 17: T1 · sagittal · 4.0mm · 0.81mm/px · 7 of 17 slices shown (1 of 2)]
[im 1/17]
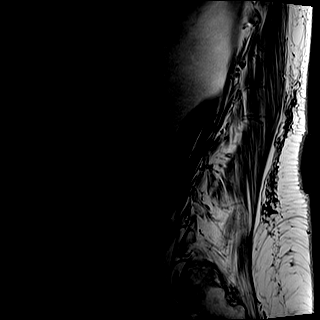
[im 3/17]
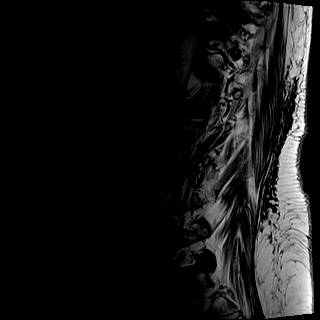
[im 6/17]
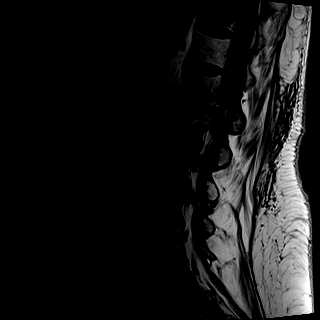
[im 9/17]
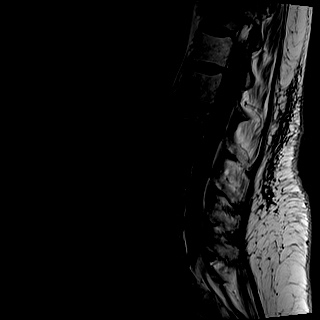
[im 11/17]
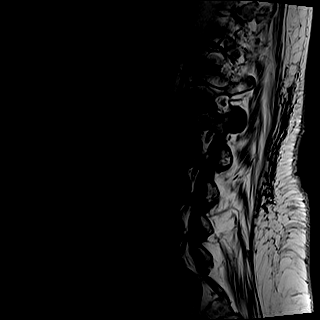
[im 14/17]
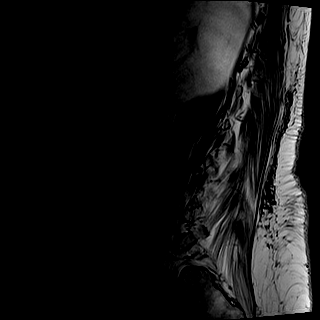
[im 17/17]
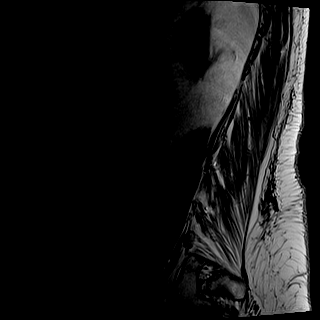

[Series 19: T2 · axial · 4.0mm · 0.78mm/px · z∈[-555,-349]mm · 8 of 34 slices shown (2 of 2)]
[im 1/34]
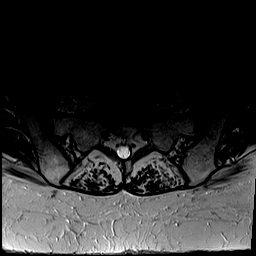
[im 6/34]
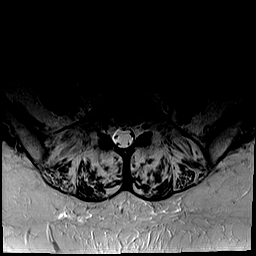
[im 11/34]
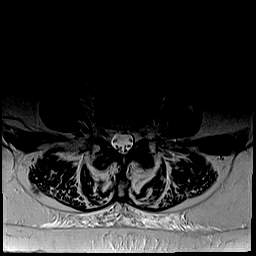
[im 16/34]
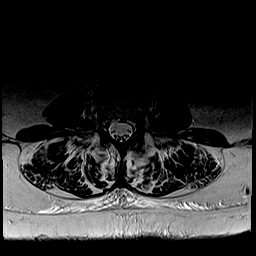
[im 18/34]
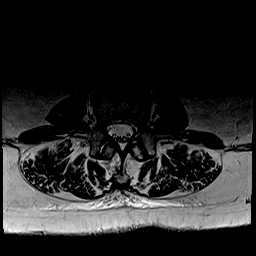
[im 23/34]
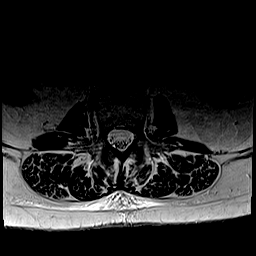
[im 28/34]
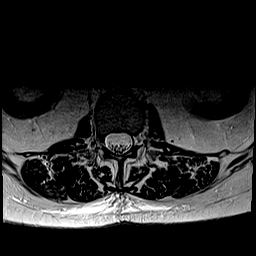
[im 34/34]
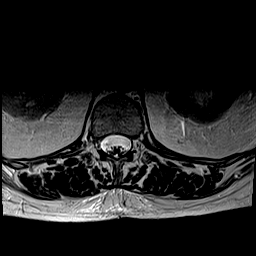

[Series 20: T1 · axial · 4.0mm · 0.39mm/px · z∈[-555,-349]mm · 8 of 34 slices shown (2 of 2)]
[im 1/34]
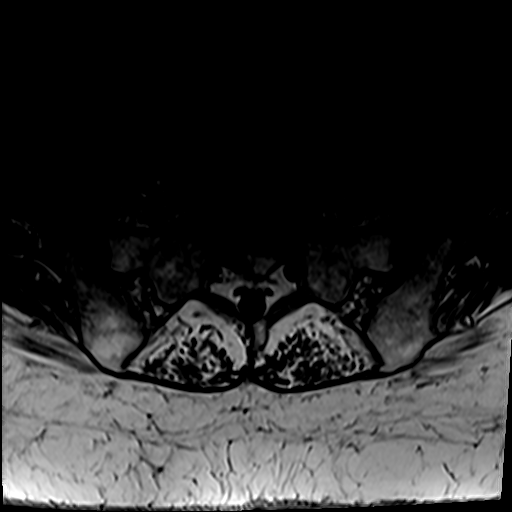
[im 6/34]
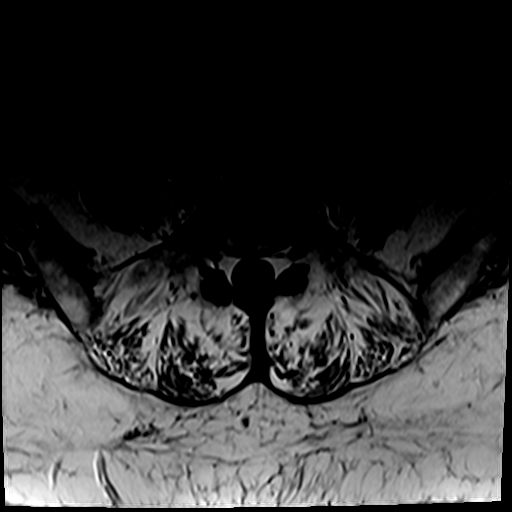
[im 11/34]
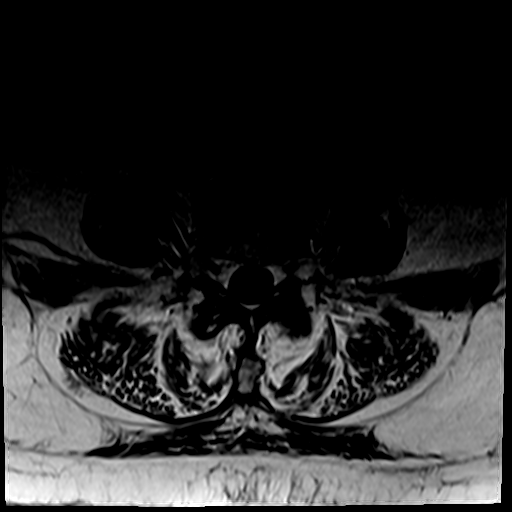
[im 16/34]
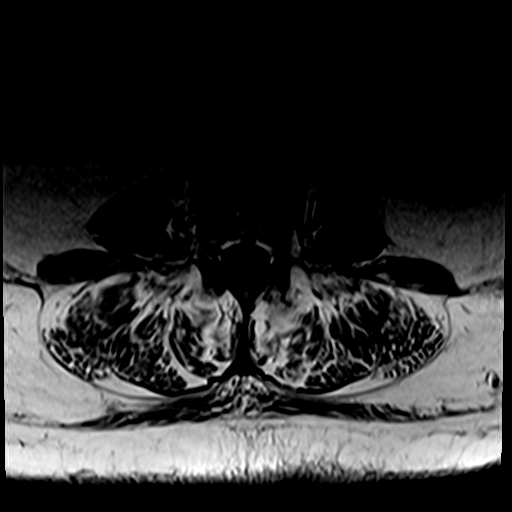
[im 18/34]
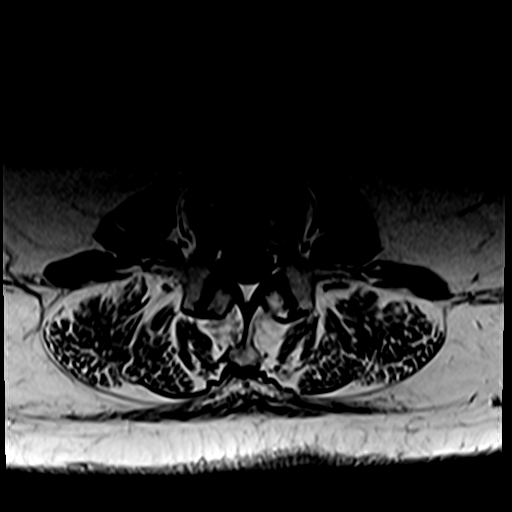
[im 23/34]
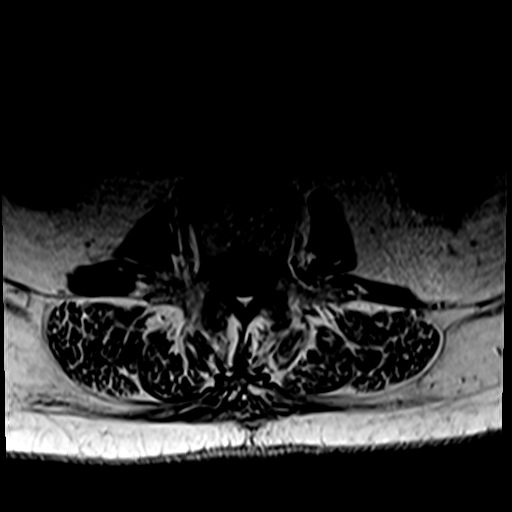
[im 28/34]
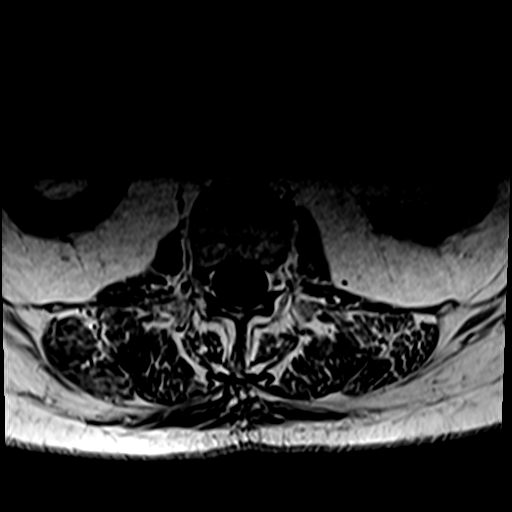
[im 34/34]
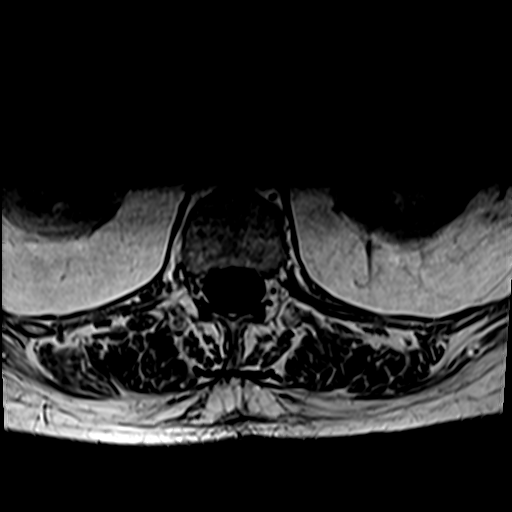

[29 of 48 positions shown; findings below may reference images not displayed]

FINDINGS: MRI THORACIC SPINE FINDINGS

Alignment: Physiologic with preservation of the normal thoracic
kyphosis. No listhesis.

Vertebrae: Acute compression fracture involving the superior
endplate of T11 with mild 20% height loss and trace 2 mm bony
retropulsion. No significant spinal stenosis.

Otherwise, vertebral body height maintained with no other acute or
chronic fracture. Bone marrow signal intensity within normal limits.
No worrisome osseous lesions. Discogenic reactive endplate change
present about the T6-7 interspace. No other abnormal marrow edema.

Cord: Normal signal morphology. Incidental note made of a 1.2 cm
well-circumscribed cystic lesion at the posterior epidural space at
the level of T4-5 (series 21, image 7), nonspecific, likely a small
arachnoid cyst. Finding felt to be of doubtful significance.

Paraspinal and other soft tissues: Paraspinous soft tissues
demonstrate no acute finding.

Disc levels:

T6-7: Degenerative disc bulge with reactive endplate change. No
significant spinal stenosis. Foramina remain patent.

T7-8: Small central disc protrusion indents the ventral thecal sac
(series 23, image 23). Minimal flattening of the ventral cord
without significant spinal stenosis or cord signal changes. Foramina
remain patent.

Otherwise, no other significant disc pathology seen within the
thoracic spine for age. No other stenosis or neural impingement.

MRI LUMBAR SPINE FINDINGS

Segmentation: Standard. Lowest well-formed disc space labeled the
L5-S1 level.

Alignment: Physiologic with preservation of the normal lumbar
lordosis. No listhesis.

Vertebrae: Chronic compression fracture involving the superior
endplate of L2 with up to 50% height loss and trace 2 mm bony
retropulsion. Otherwise, vertebral body height maintained with no
other acute or chronic fracture. Bone marrow signal intensity within
normal limits. No worrisome osseous lesions. No abnormal marrow
edema.

Conus medullaris and cauda equina: Conus extends to the T12. Level.
Conus and cauda equina appear normal.

Paraspinal and other soft tissues: Unremarkable.

Disc levels:

L1-2: Mild disc bulge with disc desiccation. Trace bony retropulsion
related to the chronic T12 compression fracture. Mild facet
hypertrophy. No spinal stenosis. Foramina remain patent.

L2-3: Minimal annular disc bulge with mild facet hypertrophy. No
canal or foraminal stenosis.

L3-4: Disc desiccation with mild annular disc bulge. Superimposed
small right foraminal to extraforaminal disc protrusion closely
approximates the exiting right L3 nerve root (series 19, image 22).
Mild facet hypertrophy with small joint effusions. No spinal
stenosis. Foramina remain patent.

L4-5: Disc desiccation with minimal annular disc bulge. Mild facet
hypertrophy with trace joint effusions. No spinal stenosis. Foramina
remain patent.

L5-S1: Negative interspace. No significant canal or lateral recess
stenosis. Foramina remain patent.
IMPRESSION: MR THORACIC SPINE IMPRESSION:

1. Acute compression fracture involving the superior endplate of T11
with mild 20% height loss and trace 2 mm bony retropulsion. No
significant spinal stenosis.
2. No other acute abnormality within the thoracic spine.
3. Small central disc protrusion at T7-8 with minimal cord
flattening, but no cord signal changes or significant stenosis.

MR LUMBAR SPINE IMPRESSION:

1. No acute abnormality within the lumbar spine.
2. Chronic compression fracture involving the superior endplate of
L2 with up to 50% height loss and trace 2 mm bony retropulsion. No
significant stenosis.
3. Small right foraminal to extraforaminal disc protrusion at L3-4,
closely approximating and potentially irritating the exiting right
L3 nerve root.

## 2021-04-30 IMAGING — MR MR THORACIC SPINE W/O CM
5 of 6 series · 29 of 48 positions shown · non-contrast
Comparison: Prior CT from earlier the same day.

CLINICAL DATA: Initial evaluation for acute mid back pain,
compression fracture.

EXAM:
MRI THORACIC AND LUMBAR SPINE WITHOUT CONTRAST
TECHNIQUE: Multiplanar and multiecho pulse sequences of the thoracic and lumbar
spine were obtained without intravenous contrast.

[Series 18: T1 · sagittal · 6.0mm · 1.88mm/px · 4 of 11 slices shown (1 of 2)]
[im 1/11]
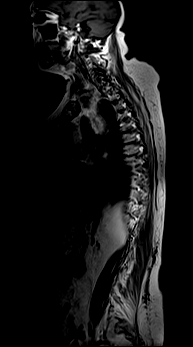
[im 4/11]
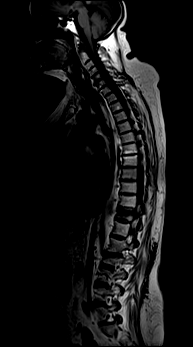
[im 7/11]
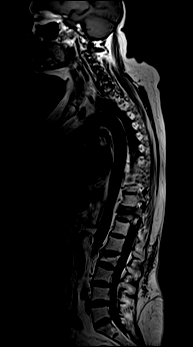
[im 11/11]
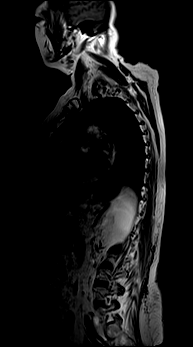

[Series 19: T2 · sagittal · 3.0mm · 1.06mm/px · 6 of 20 slices shown (1 of 2)]
[im 1/20]
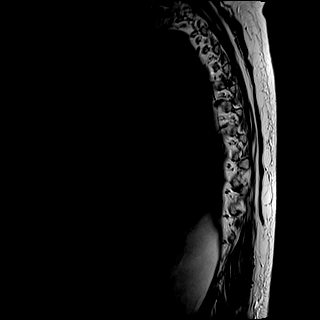
[im 4/20]
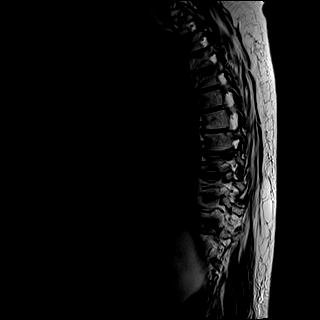
[im 8/20]
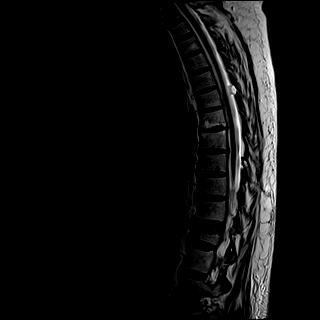
[im 12/20]
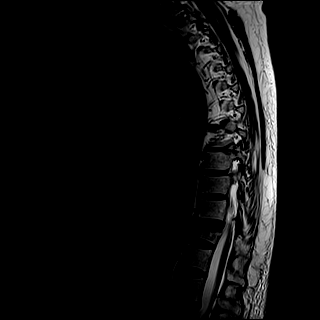
[im 16/20]
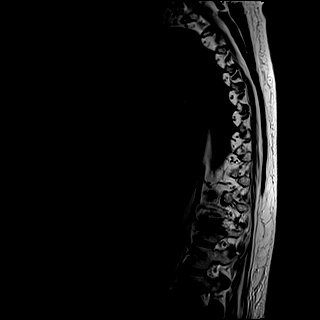
[im 20/20]
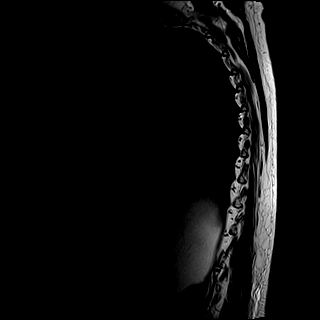

[Series 20: T1 · sagittal · 3.0mm · 1.06mm/px · 6 of 20 slices shown (2 of 2)]
[im 1/20]
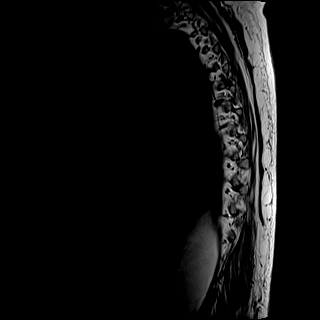
[im 4/20]
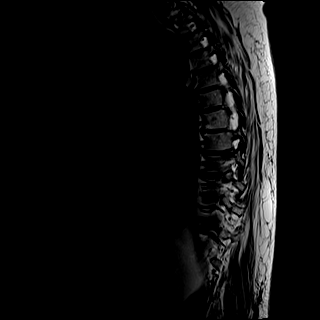
[im 8/20]
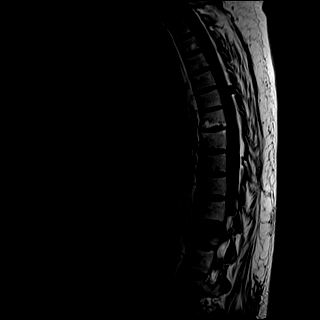
[im 12/20]
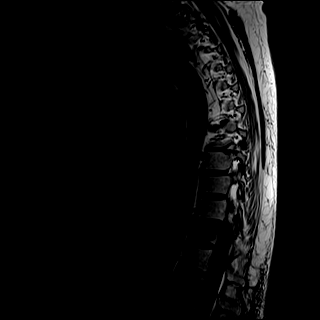
[im 16/20]
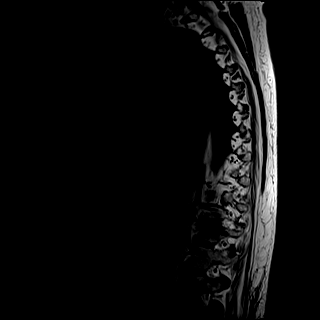
[im 20/20]
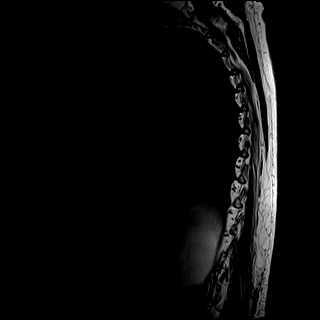

[Series 21: STIR · sagittal · 3.0mm · 0.53mm/px · 4 of 20 slices shown]
[im 1/20]
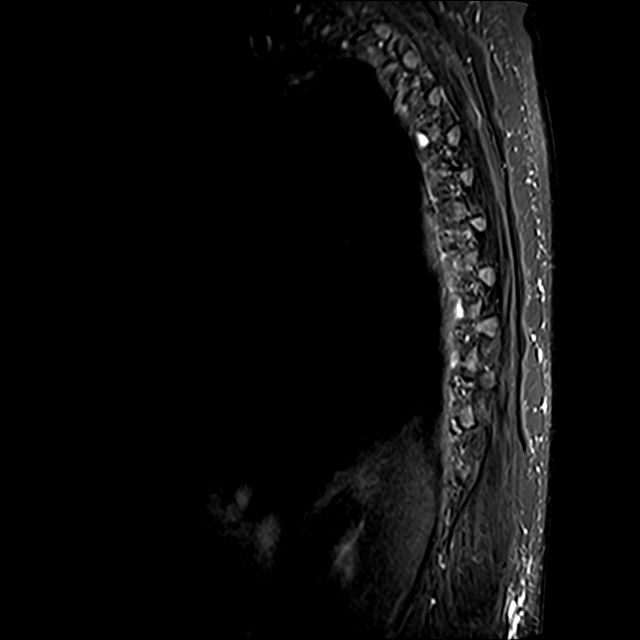
[im 4/20]
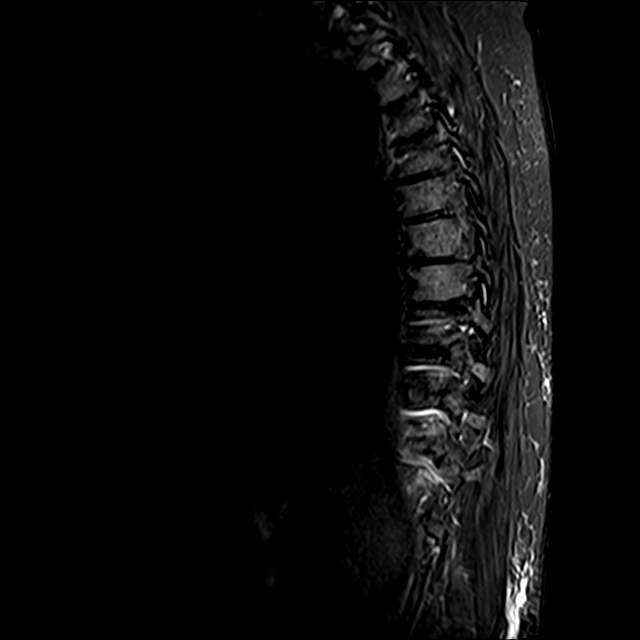
[im 8/20]
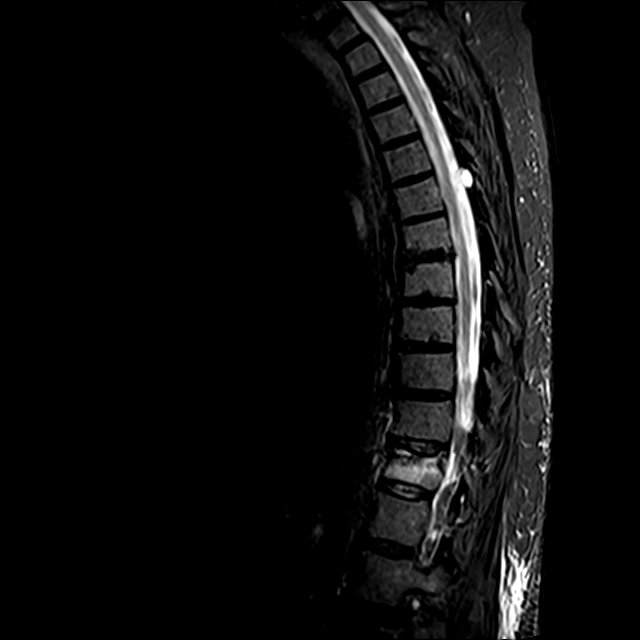
[im 12/20]
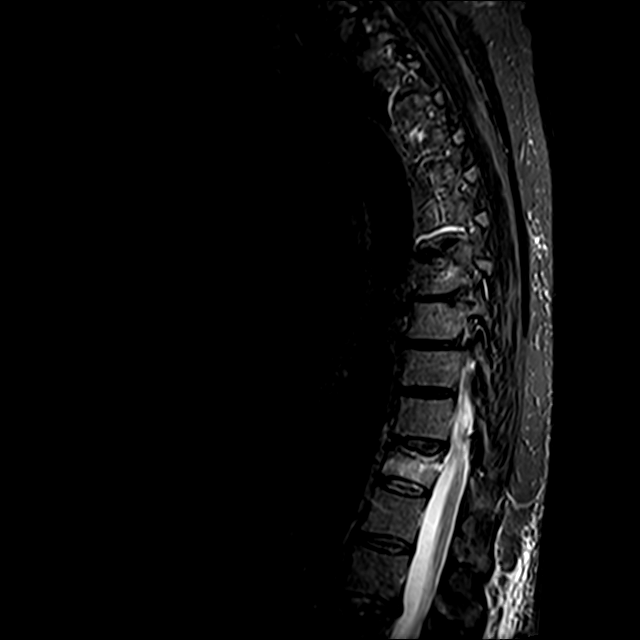

[Series 22: T2 · axial · 4.0mm · 0.59mm/px · z∈[-367,-146]mm · 9 of 40 slices shown (2 of 2)]
[im 1/40]
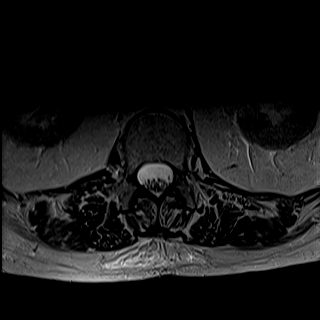
[im 7/40]
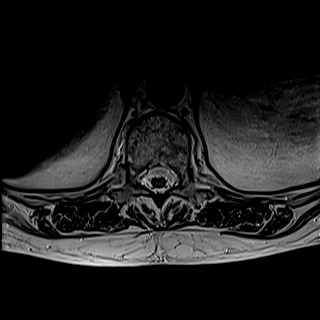
[im 14/40]
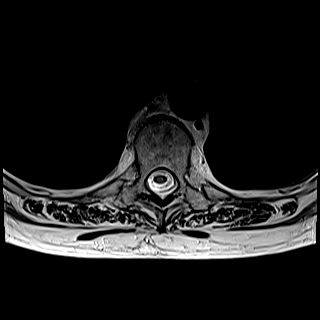
[im 17/40]
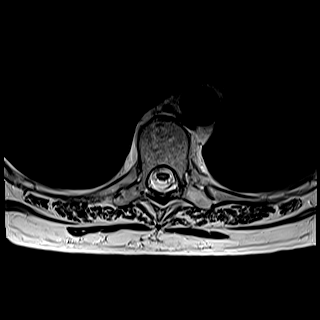
[im 20/40]
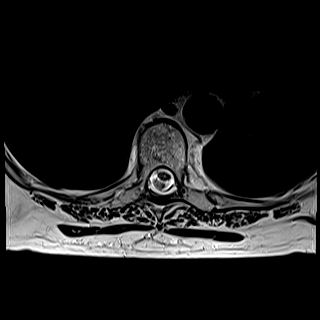
[im 23/40]
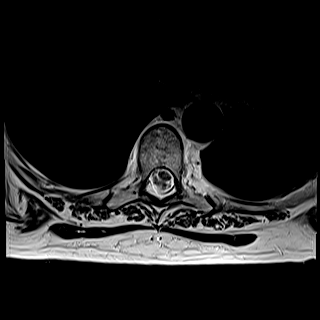
[im 27/40]
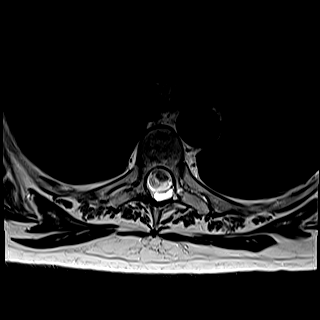
[im 33/40]
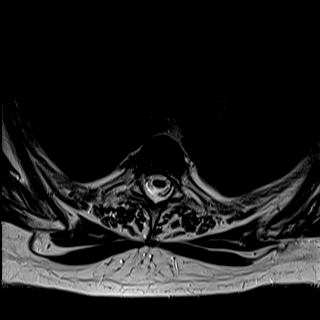
[im 40/40]
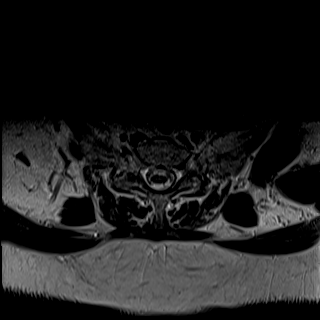

[29 of 48 positions shown; findings below may reference images not displayed]

FINDINGS: MRI THORACIC SPINE FINDINGS

Alignment: Physiologic with preservation of the normal thoracic
kyphosis. No listhesis.

Vertebrae: Acute compression fracture involving the superior
endplate of T11 with mild 20% height loss and trace 2 mm bony
retropulsion. No significant spinal stenosis.

Otherwise, vertebral body height maintained with no other acute or
chronic fracture. Bone marrow signal intensity within normal limits.
No worrisome osseous lesions. Discogenic reactive endplate change
present about the T6-7 interspace. No other abnormal marrow edema.

Cord: Normal signal morphology. Incidental note made of a 1.2 cm
well-circumscribed cystic lesion at the posterior epidural space at
the level of T4-5 (series 21, image 7), nonspecific, likely a small
arachnoid cyst. Finding felt to be of doubtful significance.

Paraspinal and other soft tissues: Paraspinous soft tissues
demonstrate no acute finding.

Disc levels:

T6-7: Degenerative disc bulge with reactive endplate change. No
significant spinal stenosis. Foramina remain patent.

T7-8: Small central disc protrusion indents the ventral thecal sac
(series 23, image 23). Minimal flattening of the ventral cord
without significant spinal stenosis or cord signal changes. Foramina
remain patent.

Otherwise, no other significant disc pathology seen within the
thoracic spine for age. No other stenosis or neural impingement.

MRI LUMBAR SPINE FINDINGS

Segmentation: Standard. Lowest well-formed disc space labeled the
L5-S1 level.

Alignment: Physiologic with preservation of the normal lumbar
lordosis. No listhesis.

Vertebrae: Chronic compression fracture involving the superior
endplate of L2 with up to 50% height loss and trace 2 mm bony
retropulsion. Otherwise, vertebral body height maintained with no
other acute or chronic fracture. Bone marrow signal intensity within
normal limits. No worrisome osseous lesions. No abnormal marrow
edema.

Conus medullaris and cauda equina: Conus extends to the T12. Level.
Conus and cauda equina appear normal.

Paraspinal and other soft tissues: Unremarkable.

Disc levels:

L1-2: Mild disc bulge with disc desiccation. Trace bony retropulsion
related to the chronic T12 compression fracture. Mild facet
hypertrophy. No spinal stenosis. Foramina remain patent.

L2-3: Minimal annular disc bulge with mild facet hypertrophy. No
canal or foraminal stenosis.

L3-4: Disc desiccation with mild annular disc bulge. Superimposed
small right foraminal to extraforaminal disc protrusion closely
approximates the exiting right L3 nerve root (series 19, image 22).
Mild facet hypertrophy with small joint effusions. No spinal
stenosis. Foramina remain patent.

L4-5: Disc desiccation with minimal annular disc bulge. Mild facet
hypertrophy with trace joint effusions. No spinal stenosis. Foramina
remain patent.

L5-S1: Negative interspace. No significant canal or lateral recess
stenosis. Foramina remain patent.
IMPRESSION: MR THORACIC SPINE IMPRESSION:

1. Acute compression fracture involving the superior endplate of T11
with mild 20% height loss and trace 2 mm bony retropulsion. No
significant spinal stenosis.
2. No other acute abnormality within the thoracic spine.
3. Small central disc protrusion at T7-8 with minimal cord
flattening, but no cord signal changes or significant stenosis.

MR LUMBAR SPINE IMPRESSION:

1. No acute abnormality within the lumbar spine.
2. Chronic compression fracture involving the superior endplate of
L2 with up to 50% height loss and trace 2 mm bony retropulsion. No
significant stenosis.
3. Small right foraminal to extraforaminal disc protrusion at L3-4,
closely approximating and potentially irritating the exiting right
L3 nerve root.

## 2021-04-30 IMAGING — CT CT ANGIO CHEST-ABD-PELV FOR DISSECTION W/ AND WO/W CM
2 of 7 series · 14 of 46 positions shown, 16 images · IV contrast (APPLIED)
Comparison: [DATE]

CLINICAL DATA: Abdominal pain, severe epigastric and back pain,
possible abdominal aortic aneurysm, possible perforated ulcer

EXAM:
CT ANGIOGRAPHY CHEST, ABDOMEN AND PELVIS
TECHNIQUE: Non-contrast CT of the chest was initially obtained.

[Series 4: axial arterial · axial · arterial · 0.78mm/px · z∈[+130,+664]mm · 11 of 206 slices shown, 13 images]
[im 14/206  soft-tissue]
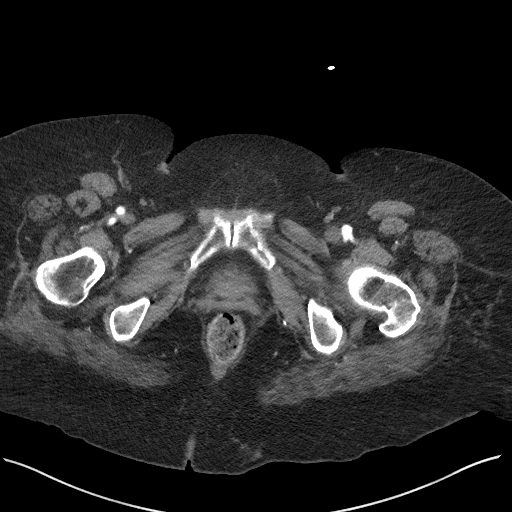
[im 14/206  bone]
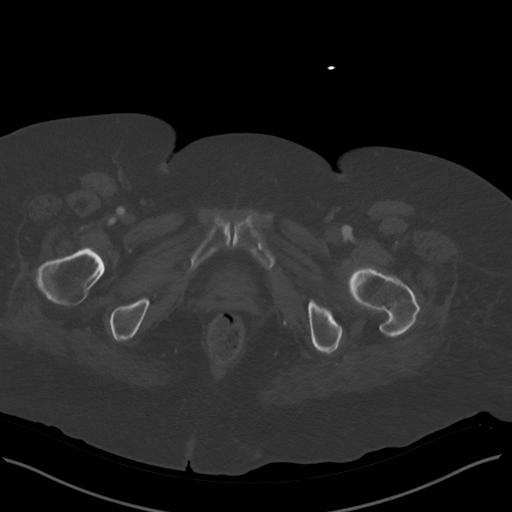
[im 28/206  soft-tissue]
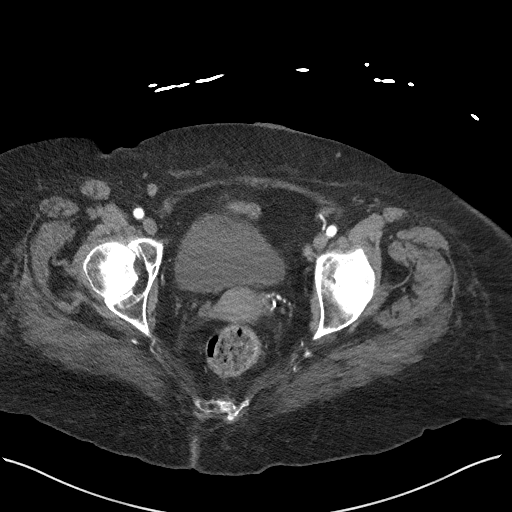
[im 55/206  soft-tissue]
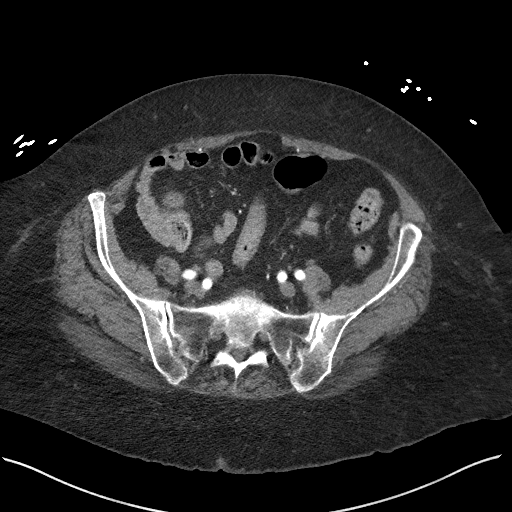
[im 69/206  soft-tissue]
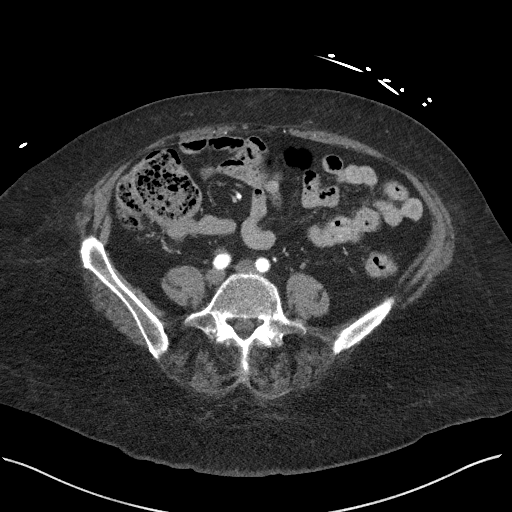
[im 83/206  soft-tissue]
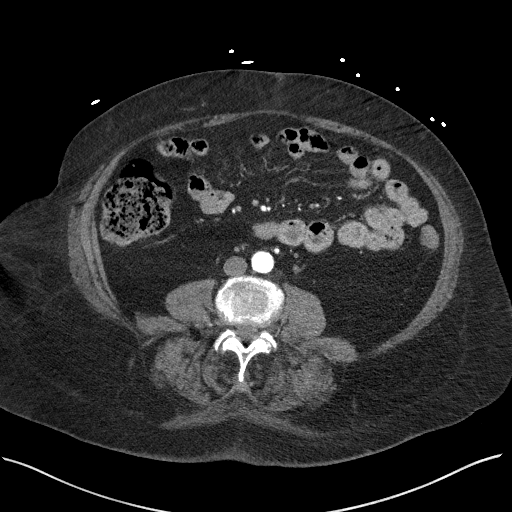
[im 110/206  soft-tissue]
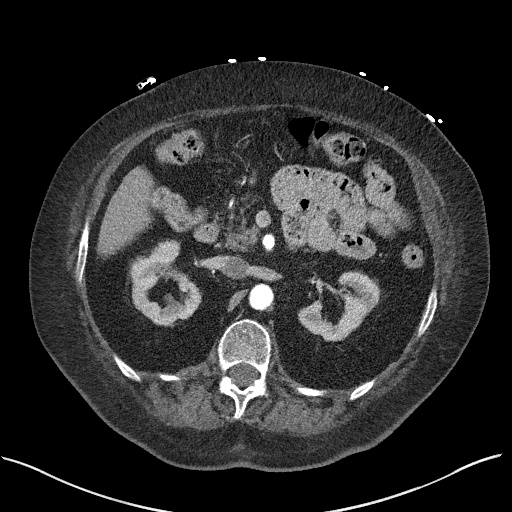
[im 124/206  soft-tissue]
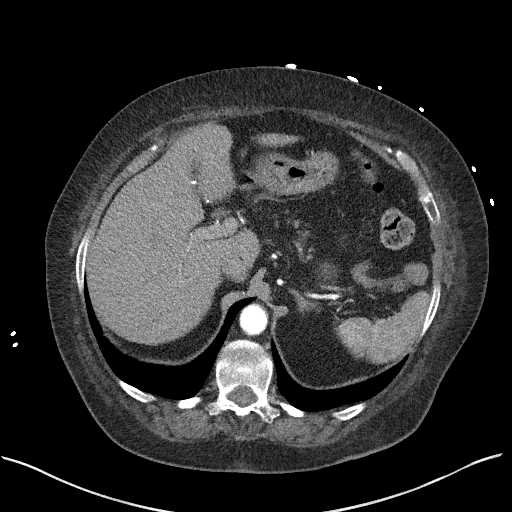
[im 137/206  soft-tissue]
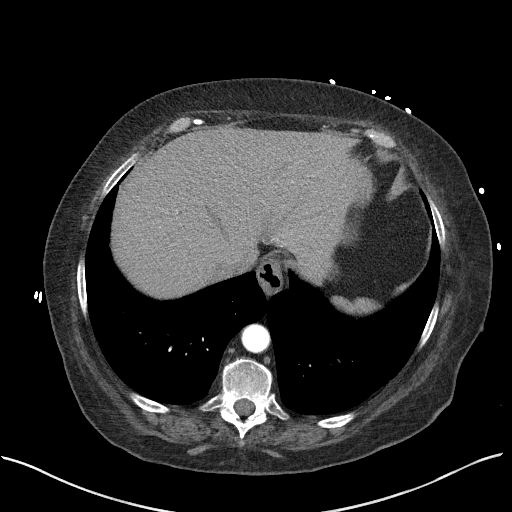
[im 151/206  soft-tissue]
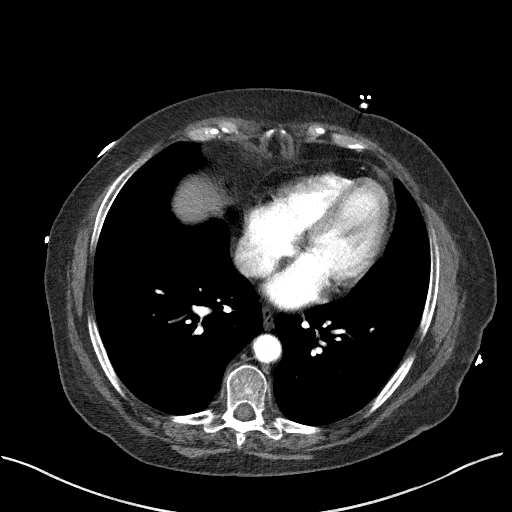
[im 151/206  bone]
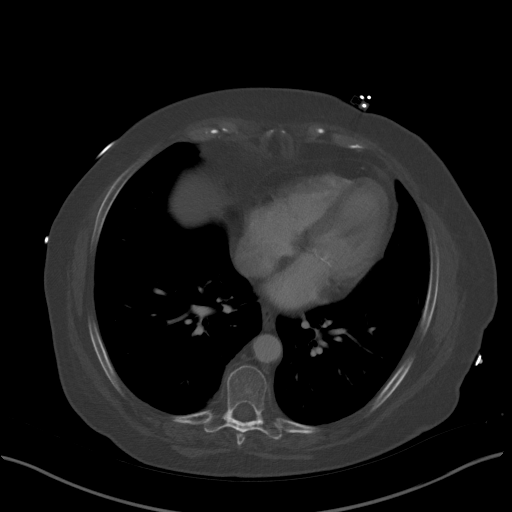
[im 178/206  soft-tissue]
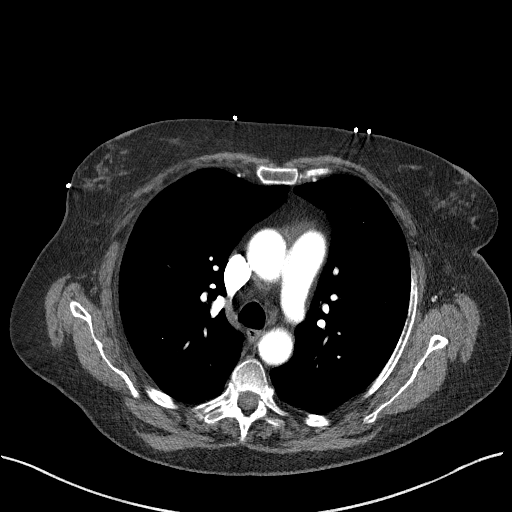
[im 192/206  soft-tissue]
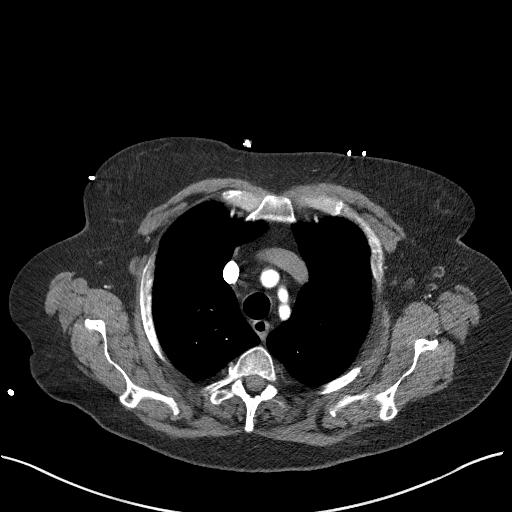

[Series 7: coronals · coronal · 0.74mm/px · 3 of 159 slices shown]
[im 40/159  soft-tissue]
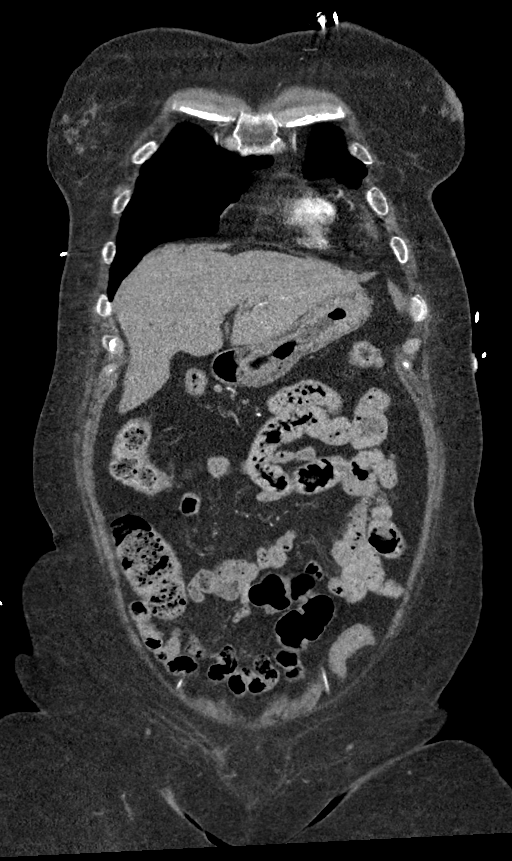
[im 80/159  soft-tissue]
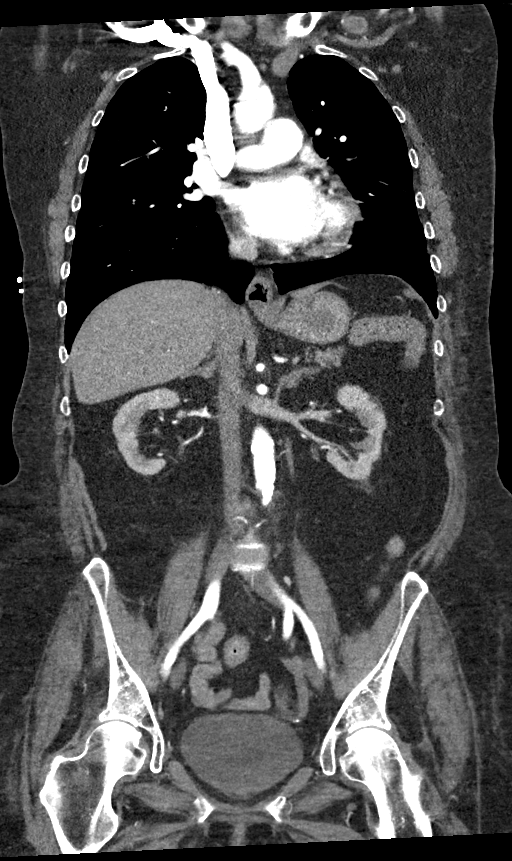
[im 119/159  soft-tissue]
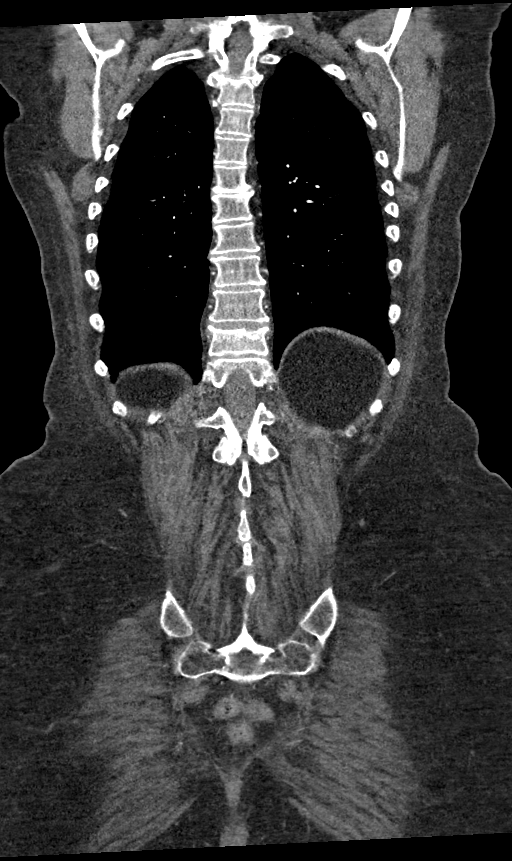

[14 of 46 positions shown; findings below may reference images not displayed]

Multidetector CT imaging through the chest, abdomen and pelvis was
performed using the standard protocol during bolus administration of
intravenous contrast. Multiplanar reconstructed images and MIPs were
obtained and reviewed to evaluate the vascular anatomy.

CONTRAST:  100mL OMNIPAQUE IOHEXOL 350 MG/ML SOLN
FINDINGS: CTA CHEST FINDINGS

Cardiovascular: Preferential opacification of the thoracic aorta.
Normal contour and caliber of the thoracic aorta without evidence of
aneurysm, dissection, or other acute aortic pathology. Normal heart
size. Left coronary artery calcifications. No pericardial effusion.
Mild mixed calcific aortic atherosclerosis.

Mediastinum/Nodes: No enlarged mediastinal, hilar, or axillary lymph
nodes. Small hiatal hernia. Thyroid gland, trachea, and esophagus
demonstrate no significant findings.

Lungs/Pleura: Lungs are clear. No pleural effusion or pneumothorax.

Musculoskeletal: No chest wall abnormality. Age indeterminate wedge
deformity of T11. Nonacute fracture of the anterior left fifth rib
(series 5, image 72).

Review of the MIP images confirms the above findings.

CTA ABDOMEN AND PELVIS FINDINGS

VASCULAR

Normal contour and caliber of the abdominal aorta. No evidence of
aneurysm, dissection, or other acute aortic pathology. Duplicated
left renal arteries with a solitary right renal artery, and
otherwise standard branching pattern of the abdominal aorta. The
branch vessel ostia are patent. Moderate mixed calcific
atherosclerosis.

Review of the MIP images confirms the above findings.

NON-VASCULAR

Hepatobiliary: No focal liver abnormality is seen. Status post
cholecystectomy. No biliary dilatation.

Pancreas: Unremarkable. No pancreatic ductal dilatation or
surrounding inflammatory changes.

Spleen: Normal in size without significant abnormality.

Adrenals/Urinary Tract: Adrenal glands are unremarkable. Kidneys are
normal, without renal calculi, solid lesion, or hydronephrosis.
Bladder is unremarkable.

Stomach/Bowel: Stomach is within normal limits. Appendix appears
normal. No evidence of bowel wall thickening, distention, or
inflammatory changes.

Lymphatic: No enlarged abdominal or pelvic lymph nodes.

Reproductive: No mass or other significant abnormality.

Other: No abdominal wall hernia or abnormality. No abdominopelvic
ascites.

Musculoskeletal: Age indeterminate wedge deformity of L2.

Review of the MIP images confirms the above findings.
IMPRESSION: 1. Normal contour and caliber of the thoracic and abdominal aorta
without evidence of aneurysm, dissection, or other acute aortic
pathology. Mild-to-moderate mixed calcific atherosclerosis.
2. No acute CT findings of the abdomen or pelvis to explain pain on
this early arterial phase examination.
3. Age indeterminate wedge deformities of T11 and L2. Correlate for
acute point tenderness.
4. Small hiatal hernia.
5. Coronary artery disease.

Aortic Atherosclerosis ([5D]-[5D]).

## 2021-04-30 MED ORDER — DIAZEPAM 5 MG PO TABS
2.5000 mg | ORAL_TABLET | Freq: Three times a day (TID) | ORAL | Status: DC | PRN
  Administered 2021-05-01: 14:00:00 5 mg via ORAL
  Filled 2021-04-30: qty 1

## 2021-04-30 MED ORDER — PANTOPRAZOLE INFUSION (NEW) - SIMPLE MED
8.0000 mg/h | INTRAVENOUS | Status: DC
  Filled 2021-04-30: qty 100

## 2021-04-30 MED ORDER — INSULIN ASPART 100 UNIT/ML IJ SOLN: 0.0000 [IU] | Freq: Every day | INTRAMUSCULAR | Status: DC

## 2021-04-30 MED ORDER — LEVOTHYROXINE SODIUM 50 MCG PO TABS
150.0000 ug | ORAL_TABLET | Freq: Every day | ORAL | Status: DC
  Administered 2021-05-01 – 2021-05-03 (×3): 150 ug via ORAL
  Filled 2021-04-30 (×3): qty 3

## 2021-04-30 MED ORDER — PANTOPRAZOLE 80MG IVPB - SIMPLE MED
80.0000 mg | Freq: Once | INTRAVENOUS | Status: DC
  Filled 2021-04-30: qty 100

## 2021-04-30 MED ORDER — SODIUM CHLORIDE 0.9 % IV SOLN: INTRAVENOUS | Status: DC

## 2021-04-30 MED ORDER — ONDANSETRON HCL 4 MG PO TABS: 4.0000 mg | ORAL_TABLET | Freq: Four times a day (QID) | ORAL | Status: DC | PRN

## 2021-04-30 MED ORDER — IOHEXOL 350 MG/ML SOLN
100.0000 mL | Freq: Once | INTRAVENOUS | Status: AC | PRN
  Administered 2021-04-30: 14:00:00 100 mL via INTRAVENOUS

## 2021-04-30 MED ORDER — METOPROLOL SUCCINATE ER 25 MG PO TB24
25.0000 mg | ORAL_TABLET | Freq: Every day | ORAL | Status: DC
  Administered 2021-05-01 – 2021-05-03 (×3): 25 mg via ORAL
  Filled 2021-04-30 (×3): qty 1

## 2021-04-30 MED ORDER — INSULIN ASPART 100 UNIT/ML IJ SOLN
0.0000 [IU] | Freq: Three times a day (TID) | INTRAMUSCULAR | Status: DC
  Administered 2021-05-01: 13:00:00 2 [IU] via SUBCUTANEOUS
  Administered 2021-05-01: 09:00:00 3 [IU] via SUBCUTANEOUS
  Administered 2021-05-03: 09:00:00 2 [IU] via SUBCUTANEOUS
  Filled 2021-04-30 (×3): qty 1

## 2021-04-30 MED ORDER — PANTOPRAZOLE SODIUM 40 MG IV SOLR: 40.0000 mg | Freq: Two times a day (BID) | INTRAVENOUS | Status: DC

## 2021-04-30 MED ORDER — VITAMIN D 25 MCG (1000 UNIT) PO TABS
1000.0000 [IU] | ORAL_TABLET | Freq: Every day | ORAL | Status: DC
Start: 2021-05-01 — End: 2021-05-03
  Administered 2021-05-01 – 2021-05-03 (×3): 1000 [IU] via ORAL
  Filled 2021-04-30 (×3): qty 1

## 2021-04-30 MED ORDER — TRAMADOL HCL 50 MG PO TABS
50.0000 mg | ORAL_TABLET | Freq: Two times a day (BID) | ORAL | Status: DC | PRN
  Administered 2021-04-30: 18:00:00 50 mg via ORAL
  Filled 2021-04-30: qty 1

## 2021-04-30 MED ORDER — ONDANSETRON HCL 4 MG/2ML IJ SOLN
4.0000 mg | Freq: Four times a day (QID) | INTRAMUSCULAR | Status: DC | PRN
  Administered 2021-05-01: 17:00:00 4 mg via INTRAVENOUS
  Filled 2021-04-30: qty 2

## 2021-04-30 MED ORDER — SODIUM CHLORIDE 0.9 % IV SOLN
10.0000 mL/h | Freq: Once | INTRAVENOUS | Status: AC
  Administered 2021-04-30: 15:00:00 10 mL/h via INTRAVENOUS

## 2021-04-30 MED ORDER — ACETAMINOPHEN 325 MG PO TABS
650.0000 mg | ORAL_TABLET | Freq: Four times a day (QID) | ORAL | Status: DC | PRN
  Administered 2021-05-01: 650 mg via ORAL
  Filled 2021-04-30: qty 2

## 2021-04-30 MED ORDER — ACETAMINOPHEN 650 MG RE SUPP: 650.0000 mg | Freq: Four times a day (QID) | RECTAL | Status: DC | PRN

## 2021-04-30 NOTE — H&P (Signed)
History and Physical    Katherine [name] KVQ:[id number] DOB: [birth year] DOA: 04/30/2021  PCP: [name], MD   Patient coming from: Home  I have personally briefly reviewed patient's old medical records in [place]  Chief Complaint: Epigastric pain radiating to back and syncope  HPI: Katherine [name] is a 75 y.o. female with medical history significant of A. fib on Xarelto, iron deficiency anemia, type 2 diabetes mellitus, hypothyroidism and hypertension came to ED with complaint of sudden onset worsening epigastric pain radiating to back.  On arrival of EMS patient appears pale and when they try to get her up she syncopized. Patient denies any nausea, vomiting or diarrhea.  Having regular bowel movements which are black in color but she was also taking iron supplement and bowel movements are black since then.  Patient was experiencing worsening back pain for the past 3 weeks and was taking prescription strength naproxen twice daily for the same duration.  Patient denies any recent illness, no upper respiratory symptoms.  No urinary symptoms.  No sick contacts.  She is fully vaccinated and received boosters for COVID-19 and flu.  ED Course: She was hemodynamically stable with blood pressure of 94/59.  Labs pertinent for hemoglobin of 7.1, decreased iron saturation, BUN of 75 and creatinine of 1.48. CT abdomen was without any acute abnormality but did find some wedge-shaped abnormalities in T11 and L1 concerning for compression fractures. GI was consulted.  Review of Systems: As per HPI otherwise 10 point review of systems negative.   Past Medical History:  Diagnosis Date   Anemia    Cancer ([place])    Cystocele    Depression    Diabetes mellitus without complication ([place])    Glaucoma    Hyperlipidemia    Hypertension    Hypothyroidism     Past Surgical History:  Procedure Laterality Date   BREAST EXCISIONAL BIOPSY Left 07/2012   CHOLECYSTECTOMY      COLONOSCOPY WITH PROPOFOL N/A 08/17/2015   Procedure: COLONOSCOPY WITH PROPOFOL;  Surgeon: Josefine Class, MD;  Location: [facility] ENDOSCOPY;  Service: Endoscopy;  Laterality: N/A;   ESOPHAGOGASTRODUODENOSCOPY N/A 02/25/2019   Procedure: ESOPHAGOGASTRODUODENOSCOPY (EGD);  Surgeon: Virgel Manifold, MD;  Location: [facility] ENDOSCOPY;  Service: Endoscopy;  Laterality: N/A;   ESOPHAGOGASTRODUODENOSCOPY (EGD) WITH PROPOFOL N/A 04/13/2019   Procedure: ESOPHAGOGASTRODUODENOSCOPY (EGD) WITH PROPOFOL;  Surgeon: Virgel Manifold, MD;  Location: [facility] ENDOSCOPY;  Service: Endoscopy;  Laterality: N/A;     reports that she quit smoking about 44 years ago. Her smoking use included cigarettes. She has a 8.00 pack-year smoking history. She has never used smokeless tobacco. She reports current alcohol use. She reports that she does not use drugs.  Allergies  Allergen Reactions   Lipitor [Atorvastatin]     Severe weakness, joint pain   Pravastatin     Severe weakness, joint pain   Codeine    Morphine And Related    Vytorin [Ezetimibe-Simvastatin]     Family History  Problem Relation Age of Onset   Diabetes Mother    Diabetes Father    Diabetes Brother     Prior to Admission medications   Medication Sig Start Date End Date Taking? Authorizing Provider  amLODipine (NORVASC) 5 MG tablet Take 1 tablet (5 mg total) by mouth at bedtime. 03/14/19  Yes Wieting, Richard, MD  cholecalciferol (VITAMIN D) 1000 units tablet Take 1,000 Units by mouth daily.   Yes [provider]  diazepam (VALIUM) 5 MG tablet Take 2.5-5  mg by mouth every 8 (eight) hours as needed for anxiety.    Yes [provider]  famotidine (PEPCID) 10 MG tablet Take 10 mg by mouth daily.   Yes [provider]  furosemide (LASIX) 40 MG tablet Take 40 mg by mouth daily. 04/23/21  Yes [provider]  glimepiride (AMARYL) 2 MG tablet Take 2 mg by mouth 2 (two) times daily. 03/11/21  Yes [provider]  liothyronine (CYTOMEL) 50 MCG tablet Take 50 mcg by mouth every morning. 04/10/21  Yes [provider]  lisinopril (ZESTRIL) 20 MG tablet Take 20 mg by mouth daily. 03/30/19  Yes [provider]  metFORMIN (GLUCOPHAGE) 1000 MG tablet Take 1,000 mg by mouth 2 (two) times daily with a meal.    Yes [provider]  metoprolol succinate (TOPROL-XL) 25 MG 24 hr tablet Take 25 mg by mouth daily. 03/25/21  Yes [provider]  Multiple Vitamin (MULTI-VITAMINS) TABS Take 1 tablet by mouth daily.    Yes [provider]  naproxen sodium (ALEVE) 220 MG tablet Take 440 mg by mouth daily as needed.   Yes [provider]  pioglitazone (ACTOS) 30 MG tablet Take 30 mg by mouth daily. 02/20/21  Yes [provider]  rivaroxaban (XARELTO) 20 MG TABS tablet Take 20 mg by mouth daily with supper.  03/20/15  Yes [provider]  sitaGLIPtin (JANUVIA) 100 MG tablet Take 100 mg by mouth daily.   Yes [provider]  SYNTHROID 150 MCG tablet Take 150 mcg by mouth daily. 03/11/18  Yes [provider]  pantoprazole (PROTONIX) 40 MG tablet Take 1 tablet (40 mg total) by mouth daily. Patient not taking: Reported on 04/30/2021 02/27/19   Fritzi Mandes, MD  pantoprazole (PROTONIX) 40 MG tablet TAKE 1 TABLET BY MOUTH TWICE A DAY Patient not taking: Reported on 04/30/2021 06/16/19   Virgel Manifold, MD  traMADol (ULTRAM) 50 MG tablet Take 1 tablet (50 mg total) by mouth every 12 (twelve) hours as needed for severe pain. Patient not taking: Reported on 04/30/2021 02/26/19   Fritzi Mandes, MD    Physical Exam: Vitals:   04/30/21 1545 04/30/21 1555 04/30/21 1600 04/30/21 1615  BP:  (!) 121/55 (!) 115/49   Pulse: 82 88 91 85  Resp: 10 16 19 12   Temp:  97.7 F (36.5 C)    TempSrc:  Oral    SpO2: 100% 99% 100% 100%  Weight:      Height:        General: Vital signs reviewed.  Patient is well-developed and well-nourished, in no acute  distress and cooperative with exam.  Head: Normocephalic and atraumatic. Eyes: EOMI, conjunctivae normal, no scleral icterus.  ENMT: Mucous membranes are moist.  Neck: Supple, trachea midline, normal ROM,  Cardiovascular: Irregularly irregular Pulmonary/Chest: Clear to auscultation bilaterally, no wheezes, rales, or rhonchi. Abdominal: Soft, mild epigastric tenderness, non-distended, BS +,  Extremities: No lower extremity edema bilaterally,  pulses symmetric and intact bilaterally. No cyanosis or clubbing. Neurological: A&O x3, Strength is normal and symmetric bilaterally, cranial nerve II-XII are grossly intact, no focal motor deficit, sensory intact to light touch bilaterally.  Psychiatric: Normal mood and affect.   Labs on Admission: I have personally reviewed following labs and imaging studies  CBC: Recent Labs  Lab 04/30/21 1344  WBC 5.6  NEUTROABS 4.0  HGB 7.1*  HCT 22.2*  MCV 81.3  PLT 810   Basic Metabolic Panel: Recent Labs  Lab 04/30/21 1344  NA 136  K 4.1  CL 105  CO2 24  GLUCOSE 247*  BUN 75*  CREATININE 1.48*  CALCIUM 9.6   GFR: Estimated Creatinine Clearance: 43.2 mL/min (A) (by C-G formula based on SCr of 1.48 mg/dL (H)). Liver Function Tests: Recent Labs  Lab 04/30/21 1344  AST 15  ALT 10  ALKPHOS 59  BILITOT 0.7  PROT 5.9*  ALBUMIN 3.1*   Recent Labs  Lab 04/30/21 1344  LIPASE 27   No results for input(s): AMMONIA in the last 168 hours. Coagulation Profile: No results for input(s): INR, PROTIME in the last 168 hours. Cardiac Enzymes: No results for input(s): CKTOTAL, CKMB, CKMBINDEX, TROPONINI in the last 168 hours. BNP (last 3 results) No results for input(s): PROBNP in the last 8760 hours. HbA1C: No results for input(s): HGBA1C in the last 72 hours. CBG: No results for input(s): GLUCAP in the last 168 hours. Lipid Profile: No results for input(s): CHOL, HDL, LDLCALC, TRIG, CHOLHDL, LDLDIRECT in the last 72 hours. Thyroid Function  Tests: No results for input(s): TSH, T4TOTAL, FREET4, T3FREE, THYROIDAB in the last 72 hours. Anemia Panel: Recent Labs    04/30/21 1344  FOLATE 14.6  FERRITIN 5*  TIBC 360  IRON 30   Urine analysis:    Component Value Date/Time   COLORURINE YELLOW (A) 03/14/2019 0030   APPEARANCEUR CLOUDY (A) 03/14/2019 0030   LABSPEC 1.012 03/14/2019 0030   PHURINE 5.0 03/14/2019 0030   GLUCOSEU NEGATIVE 03/14/2019 0030   HGBUR NEGATIVE 03/14/2019 0030   BILIRUBINUR NEGATIVE 03/14/2019 0030   KETONESUR NEGATIVE 03/14/2019 0030   PROTEINUR NEGATIVE 03/14/2019 0030   NITRITE NEGATIVE 03/14/2019 0030   LEUKOCYTESUR TRACE (A) 03/14/2019 0030    Radiological Exams on Admission: CT Angio Chest/Abd/Pel for Dissection W and/or W/WO  Result Date: 04/30/2021 CLINICAL DATA:  Abdominal pain, severe epigastric and back pain, possible abdominal aortic aneurysm, possible perforated ulcer EXAM: CT ANGIOGRAPHY CHEST, ABDOMEN AND PELVIS TECHNIQUE: Non-contrast CT of the chest was initially obtained. Multidetector CT imaging through the chest, abdomen and pelvis was performed using the standard protocol during bolus administration of intravenous contrast. Multiplanar reconstructed images and MIPs were obtained and reviewed to evaluate the vascular anatomy. CONTRAST:  128mL OMNIPAQUE IOHEXOL 350 MG/ML SOLN COMPARISON:  03/13/2019 FINDINGS: CTA CHEST FINDINGS Cardiovascular: Preferential opacification of the thoracic aorta. Normal contour and caliber of the thoracic aorta without evidence of aneurysm, dissection, or other acute aortic pathology. Normal heart size. Left coronary artery calcifications. No pericardial effusion. Mild mixed calcific aortic atherosclerosis. Mediastinum/Nodes: No enlarged mediastinal, hilar, or axillary lymph nodes. Small hiatal hernia. Thyroid gland, trachea, and esophagus demonstrate no significant findings. Lungs/Pleura: Lungs are clear. No pleural effusion or pneumothorax. Musculoskeletal:  No chest wall abnormality. Age indeterminate wedge deformity of T11. Nonacute fracture of the anterior left fifth rib (series 5, image 72). Review of the MIP images confirms the above findings. CTA ABDOMEN AND PELVIS FINDINGS VASCULAR Normal contour and caliber of the abdominal aorta. No evidence of aneurysm, dissection, or other acute aortic pathology. 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 The branch vessel ostia are patent. Moderate mixed calcific atherosclerosis. Review of the MIP images confirms the above findings. NON-VASCULAR Hepatobiliary: No focal liver abnormality is seen. Status post cholecystectomy. No biliary dilatation. Pancreas: Unremarkable. No pancreatic ductal dilatation or surrounding inflammatory changes. Spleen: Normal in size without significant abnormality. Adrenals/Urinary Tract: Adrenal glands are unremarkable. Kidneys are normal, without renal  calculi, solid lesion, or hydronephrosis. Bladder is unremarkable. Stomach/Bowel: Stomach is within normal limits. Appendix appears normal. No evidence of bowel wall thickening, distention, or inflammatory changes. Lymphatic: No enlarged abdominal or pelvic lymph nodes. Reproductive: No mass or other significant abnormality. Other: No abdominal wall hernia or abnormality. No abdominopelvic ascites. Musculoskeletal: Age indeterminate wedge deformity of L2. Review of the MIP images confirms the above findings. IMPRESSION: 1. Normal contour and caliber of the thoracic and abdominal aorta without evidence of aneurysm, dissection, or other acute aortic pathology. Mild-to-moderate mixed calcific atherosclerosis. 2. No acute CT findings of the abdomen or pelvis to explain pain on this early arterial phase examination. 3. Age indeterminate wedge deformities of T11 and L2. Correlate for acute point tenderness. 4. Small hiatal hernia. 5. Coronary artery disease. Aortic  Atherosclerosis (ICD10-I70.0). Electronically Signed   By: Delanna Ahmadi M.D.   On: 04/30/2021 14:29    EKG: Independently reviewed.  Rate controlled atrial fibrillation with left bundle branch block, no acute change.  Assessment/Plan Principal Problem:   GI bleed    GI bleed.  Patient presentation is concerning for GI bleed.  Patient was on iron supplement for the past 52-month so could not tell whether she is having any melena.  She was on Xarelto and taking naproxen 550 mg twice daily for the past 3 weeks for worsening back pain.  Hemoglobin at 7.1 with elevated BUN, hemoglobin was 10.6 on 03/27/2021 when checked at PCP office. 2 unit of PRBC ordered in ED. GI was consulted-they will wait for at least 48 hours before proceeding for EGD as she was on Xarelto. -Start her on Protonix infusion -Monitor hemoglobin -Transfusions if dropped below 8.  Permanent atrial fibrillation.  Rate controlled on metoprolol. Patient was on Xarelto-which is on hold due to concern of GI bleed. -Continue with metoprolol  Essential hypertension.  Blood pressure on lower end of normal. On amlodipine, lisinopril and metoprolol at home. -Continue metoprolol for rate control -Holding home dose of amlodipine and lisinopril  Azotemia.  Most likely secondary to GI bleed.  Creatinine seems to be around baseline. -Monitor renal function -Avoid nephrotoxins  Type 2 diabetes mellitus.  A1c checked last month was 7 She was on metformin, Amaryl, Januvia and Actos at home -SSI while in the hospital  Hypothyroidism. -Continue home Synthroid -Liothyronine is also mentioned in her med list-awaiting med rec to be completed.  Back pain.  Seems chronic but seems to be worsened over the past 1 month.  CT chest and abdomen with some concern of compression fractures of T11 and L1. -MRI was ordered by ED provider-pending. -Might get benefit from kyphoplasty -Avoid NSAID -Tylenol and tramadol for pain management   DVT  prophylaxis: SCDs Code Status: Limited code Family Communication: Discussed with husband at bedside Disposition Plan: Back to the same environment Consults called: Gastroenterology Admission status: Inpatient   Lorella Nimrod MD [facility]  If 7PM-7AM, please contact night-coverage [email or web address]  04/30/2021, 5:30 PM   This record has been created using Systems analyst. Errors have been sought and corrected,but may not always be located. Such creation errors do not reflect on the standard of care.

## 2021-04-30 NOTE — ED Provider Notes (Addendum)
[facility] Emergency Department Provider Note  ____________________________________________   None    (approximate)  I have reviewed the triage vital signs and the nursing notes.   HISTORY  Chief Complaint Back Pain (EMS called out for back pain. On arrival, pt was pale and diaphoretic and had episode of LOC for medics. Was out for approx 30 seconds to 1 minute before coming to. Pt's glucose 378. Pt A&O on arrival )   HPI Katherine [name] is a 75 y.o. female patient sick for several days developing epigastric and back pain.  Epigastric area tender to palpation as is the back.  She reports she had to have 2 units of blood here sometime ago.  She did not have a fever.  She is not been having black tarry stool or vomiting any blood.  Pain is severe.  Made worse with movement and palpation.  Been going on worsening for several days.  EMS reports they got there she had very bad pain to try to set her up and even though she had been pale she got even more pale and passed out.  On arrival here patient is very pale complaining of pain is noted.  No history of any back injury.           Patient Active Problem List   Diagnosis Date Noted   Schatzki's ring    Stomach irritation    Iron deficiency anemia secondary to blood loss (chronic)    Vasovagal syncope 03/14/2019   Syncope 03/14/2019   Hypotension    Diarrhea    Type 2 diabetes mellitus without complication, without long-term current use of insulin (HCC)    AKI (acute kidney injury) ([place]) 03/13/2019   GI bleed 02/24/2019   Lower GI bleed 06/25/2015    Past Surgical History:  Procedure Laterality Date   BREAST EXCISIONAL BIOPSY Left 07/2012   CHOLECYSTECTOMY     COLONOSCOPY WITH PROPOFOL N/A 08/17/2015   Procedure: COLONOSCOPY WITH PROPOFOL;  Surgeon: Josefine Class, MD;  Location: [facility] ENDOSCOPY;  Service: Endoscopy;  Laterality: N/A;   ESOPHAGOGASTRODUODENOSCOPY N/A 02/25/2019   Procedure:  ESOPHAGOGASTRODUODENOSCOPY (EGD);  Surgeon: Virgel Manifold, MD;  Location: [facility] ENDOSCOPY;  Service: Endoscopy;  Laterality: N/A;   ESOPHAGOGASTRODUODENOSCOPY (EGD) WITH PROPOFOL N/A 04/13/2019   Procedure: ESOPHAGOGASTRODUODENOSCOPY (EGD) WITH PROPOFOL;  Surgeon: Virgel Manifold, MD;  Location: [facility] ENDOSCOPY;  Service: Endoscopy;  Laterality: N/A;    Prior to Admission medications   Medication Sig Start Date End Date Taking? Authorizing Provider  amLODipine (NORVASC) 5 MG tablet Take 1 tablet (5 mg total) by mouth at bedtime. 03/14/19   Loletha Grayer, MD  cholecalciferol (VITAMIN D) 1000 units tablet Take 1,000 Units by mouth daily.    [provider]  diazepam (VALIUM) 5 MG tablet Take 2.5-5 mg by mouth every 8 (eight) hours as needed for anxiety.     [provider]  furosemide (LASIX) 40 MG tablet Take 80 mg by mouth daily. 04/23/21   [provider]  glimepiride (AMARYL) 2 MG tablet Take 2 mg by mouth 2 (two) times daily. 03/11/21   [provider]  liothyronine (CYTOMEL) 50 MCG tablet Take 50 mcg by mouth every morning. 04/10/21   [provider]  lisinopril (ZESTRIL) 20 MG tablet Take 20 mg by mouth daily. 03/30/19   [provider]  metFORMIN (GLUCOPHAGE) 1000 MG tablet Take 1,000 mg by mouth 2 (two) times daily with a meal.     [provider]  metoprolol succinate (TOPROL-XL) 25 MG 24 hr tablet Take 25 mg by mouth daily. 03/25/21   [provider]  Multiple Vitamin (MULTI-VITAMINS) TABS Take 1 tablet by mouth daily.     [provider]  pantoprazole (PROTONIX) 40 MG tablet Take 1 tablet (40 mg total) by mouth daily. 02/27/19   Fritzi Mandes, MD  pantoprazole (PROTONIX) 40 MG tablet TAKE 1 TABLET BY MOUTH TWICE A DAY 06/16/19   [name] B, MD  pioglitazone (ACTOS) 30 MG tablet Take 30 mg by mouth daily. 02/20/21   [provider]  rivaroxaban (XARELTO) 20 MG TABS tablet Take 20 mg  by mouth daily with supper.  03/20/15   [provider]  sitaGLIPtin (JANUVIA) 100 MG tablet Take 100 mg by mouth daily.    [provider]  SYNTHROID 150 MCG tablet Take 150 mcg by mouth daily. 03/11/18   [provider]  traMADol (ULTRAM) 50 MG tablet Take 1 tablet (50 mg total) by mouth every 12 (twelve) hours as needed for severe pain. 02/26/19   Fritzi Mandes, MD    Allergies Lipitor [atorvastatin], Pravastatin, Codeine, Morphine and related, and Vytorin [ezetimibe-simvastatin]  Family History  Problem Relation Age of Onset   Diabetes Mother    Diabetes Father    Diabetes Brother     Social History Social History   Tobacco Use   Smoking status: Former    Packs/day: 1.00    Years: 8.00    Pack years: 8.00    Types: Cigarettes    Quit date: 05/12/1976    Years since quitting: 44.9   Smokeless tobacco: Never  Vaping Use   Vaping Use: Never used  Substance Use Topics   Alcohol use: Yes    Alcohol/week: 0.0 - 2.0 standard drinks    Comment: occasional, not every week   Drug use: No    Review of Systems  Constitutional: No fever/chills Eyes: No visual changes. ENT: No sore throat. Cardiovascular: Denies chest pain. Respiratory: Denies shortness of breath. Gastrointestinal:  abdominal pain.  No nausea, no vomiting.  No diarrhea.  No constipation. Genitourinary: Negative for dysuria. Musculoskeletal: back pain. Skin: Negative for rash. Neurological: Negative for headaches, focal weakness  ____________________________________________   PHYSICAL EXAM:  VITAL SIGNS: ED Triage Vitals  Enc Vitals Group     BP 04/30/21 1334 121/81     Pulse Rate 04/30/21 1334 83     Resp 04/30/21 1334 14     Temp 04/30/21 1334 (!) 97.4 F (36.3 C)     Temp Source 04/30/21 1334 Oral     SpO2 04/30/21 1334 98 %     Weight 04/30/21 1337 241 lb 10 oz (109.6 kg)     Height 04/30/21 1337 5\' 7"  (1.702 m)     Head Circumference --      Peak Flow --      Pain  Score 04/30/21 1337 10     Pain Loc --      Pain Edu? --      Excl. in [place]? --    Constitutional: Alert and oriented.  Patient acts well but is very very pale and complaining of pain as noted in HPI Eyes: Conjunctivae are normal.  Head: Atraumatic. Nose: No congestion/rhinnorhea. Mouth/Throat: Mucous membranes are moist.  Oropharynx non-erythematous. Neck: No stridor. Cardiovascular: Normal rate, regular rhythm. Grossly normal heart sounds.   Respiratory: Normal respiratory effort.  No retractions. Lungs CTAB. Gastrointestinal: Soft tender epigastric area no distention. No abdominal bruits. No  CVA tenderness. Musculoskeletal: No lower extremity tenderness nor edema.  Back is diffusely tender to palpation over the spine Neurologic:  Normal speech and language. No gross focal neurologic deficits are appreciated. Skin:  Skin is warm, dry and intact. No rash noted.  Very pale  ____________________________________________   LABS (all labs ordered are listed, but only abnormal results are displayed)  Labs Reviewed  COMPREHENSIVE METABOLIC PANEL - Abnormal; Notable for the following components:      Result Value   Glucose, Bld 247 (*)    BUN 75 (*)    Creatinine, Ser 1.48 (*)    Total Protein 5.9 (*)    Albumin 3.1 (*)    GFR, Estimated 37 (*)    All other components within normal limits  CBC WITH DIFFERENTIAL/PLATELET - Abnormal; Notable for the following components:   RBC 2.73 (*)    Hemoglobin 7.1 (*)    HCT 22.2 (*)    RDW 19.7 (*)    All other components within normal limits  LACTIC ACID, PLASMA - Abnormal; Notable for the following components:   Lactic Acid, Venous 2.0 (*)    All other components within normal limits  RESP PANEL BY RT-PCR (FLU A&B, COVID) ARPGX2  LIPASE, BLOOD  LACTIC ACID, PLASMA  PREPARE RBC (CROSSMATCH)  TYPE AND SCREEN  TROPONIN I (HIGH SENSITIVITY)  TROPONIN I (HIGH SENSITIVITY)   ____________________________________________  EKG EKG read  interpreted by me shows A. fib with left bundle branch block flipped T's in V5 and V6 new since last month.  ____________________________________________  RADIOLOGY Gertha Calkin, personally viewed and evaluated these images (plain radiographs) as part of my medical decision making, as well as reviewing the written report by the radiologist.  ED MD interpretatio CT read by radiology reviewed by me does not show any aneurysm.  Radiology saw to compression fractures which may or may not be acute.  Patient is diffusely tender up and down the spine.  Additionally she has epigastric pain.  The compression fractures would not however give her any anemia.  There is no sign of where the anemia is coming from.  Likely source is GI bleed though with her BUN being so high.  Official radiology report(s): CT Angio Chest/Abd/Pel for Dissection W and/or W/WO  Result Date: 04/30/2021 CLINICAL DATA:  Abdominal pain, severe epigastric and back pain, possible abdominal aortic aneurysm, possible perforated ulcer EXAM: CT ANGIOGRAPHY CHEST, ABDOMEN AND PELVIS TECHNIQUE: Non-contrast CT of the chest was initially obtained. Multidetector CT imaging through the chest, abdomen and pelvis was performed using the standard protocol during bolus administration of intravenous contrast. Multiplanar reconstructed images and MIPs were obtained and reviewed to evaluate the vascular anatomy. CONTRAST:  164mL OMNIPAQUE IOHEXOL 350 MG/ML SOLN COMPARISON:  03/13/2019 FINDINGS: CTA CHEST FINDINGS Cardiovascular: Preferential opacification of the thoracic aorta. Normal contour and caliber of the thoracic aorta without evidence of aneurysm, dissection, or other acute aortic pathology. Normal heart size. Left coronary artery calcifications. No pericardial effusion. Mild mixed calcific aortic atherosclerosis. Mediastinum/Nodes: No enlarged mediastinal, hilar, or axillary lymph nodes. Small hiatal hernia. Thyroid gland, trachea, and  esophagus demonstrate no significant findings. Lungs/Pleura: Lungs are clear. No pleural effusion or pneumothorax. Musculoskeletal: No chest wall abnormality. Age indeterminate wedge deformity of T11. Nonacute fracture of the anterior left fifth rib (series 5, image 72). Review of the MIP images confirms the above findings. CTA ABDOMEN AND PELVIS FINDINGS VASCULAR Normal contour and caliber of the abdominal aorta. No evidence of aneurysm, dissection,  or other acute aortic pathology. 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 The branch vessel ostia are patent. Moderate mixed calcific atherosclerosis. Review of the MIP images confirms the above findings. NON-VASCULAR Hepatobiliary: No focal liver abnormality is seen. Status post cholecystectomy. No biliary dilatation. Pancreas: Unremarkable. No pancreatic ductal dilatation or surrounding inflammatory changes. Spleen: Normal in size without significant abnormality. Adrenals/Urinary Tract: Adrenal glands are unremarkable. Kidneys are normal, without renal calculi, solid lesion, or hydronephrosis. Bladder is unremarkable. Stomach/Bowel: Stomach is within normal limits. Appendix appears normal. No evidence of bowel wall thickening, distention, or inflammatory changes. Lymphatic: No enlarged abdominal or pelvic lymph nodes. Reproductive: No mass or other significant abnormality. Other: No abdominal wall hernia or abnormality. No abdominopelvic ascites. Musculoskeletal: Age indeterminate wedge deformity of L2. Review of the MIP images confirms the above findings. IMPRESSION: 1. Normal contour and caliber of the thoracic and abdominal aorta without evidence of aneurysm, dissection, or other acute aortic pathology. Mild-to-moderate mixed calcific atherosclerosis. 2. No acute CT findings of the abdomen or pelvis to explain pain on this early arterial phase examination. 3. Age  indeterminate wedge deformities of T11 and L2. Correlate for acute point tenderness. 4. Small hiatal hernia. 5. Coronary artery disease. Aortic Atherosclerosis (ICD10-I70.0). Electronically Signed   By: Delanna Ahmadi M.D.   On: 04/30/2021 14:29    ____________________________________________   PROCEDURES  Procedure(s) performed (including Critical Care): Attempted bedside ultrasound.  FAST exam showed no free fluid however was unable to visualize the aorta due to overlying gas.  Critical care time including going over her recent old records and speaking with her reviewing her studies speaking with the hospitalist etc. 25 minutes. Procedures   ____________________________________________   INITIAL IMPRESSION / ASSESSMENT AND PLAN / ED COURSE  Patient with severe epigastric and back pain possibly due to peptic ulcer disease or abdominal aortic aneurysm possibly even could be pneumonia or gallbladder disease or kidney stone or pancreatic disease.  Patient is very pale.  Blood pressure is somewhat low now.  She is anemic.  I will begin transfusing her to prevent her blood pressure from falling further.  If necessary I will get a emergency release unit of blood.  O+ would be good for her and she is postmenopausal.     ----------------------------------------- 4:09 PM on 04/30/2021 ----------------------------------------- Patient with epigastric pain and back pain.  No sign of any aneurysm.  She is anemic and the high BUN with essentially normal creatinine makes me think that this is due to blood being reabsorbed and got.  Likely she has a GI bleed possibly peptic ulcer.  It does not seem to be perforated.  I do not think the epigastric pain would be caused by the lumbar fractures although is possible.  However her spine was diffusely tender to palpation.  She may have tube problems at once i.e. the GI bleed and then spinal fracture.   ----------------------------------------- 4:23 PM on  04/30/2021 ----------------------------------------- Have discussed with hospitalist.  We will go ahead and order MRI of T and L-spine to assess the acuity of the fractures and to make sure there is no retropulsion or any other acute pathology that needs to be addressed.  I have discussed this with the patient  ____________________________________________   FINAL CLINICAL IMPRESSION(S) / ED DIAGNOSES  Final diagnoses:  Acute midline back pain, unspecified back location  Epigastric pain  Symptomatic anemia     ED Discharge Orders  None        Note:  This document was prepared using Dragon voice recognition software and may include unintentional dictation errors.    Nena Polio, MD 04/30/21 1604    Nena Polio, MD 04/30/21 1630

## 2021-05-01 DIAGNOSIS — D500 Iron deficiency anemia secondary to blood loss (chronic): Secondary | ICD-10-CM

## 2021-05-01 DIAGNOSIS — R1013 Epigastric pain: Secondary | ICD-10-CM

## 2021-05-01 DIAGNOSIS — K921 Melena: Secondary | ICD-10-CM

## 2021-05-01 LAB — BASIC METABOLIC PANEL
Anion gap: 3 — ABNORMAL LOW (ref 5–15)
BUN: 65 mg/dL — ABNORMAL HIGH (ref 8–23)
CO2: 27 mmol/L (ref 22–32)
Calcium: 9.4 mg/dL (ref 8.9–10.3)
Chloride: 109 mmol/L (ref 98–111)
Creatinine, Ser: 1.09 mg/dL — ABNORMAL HIGH (ref 0.44–1.00)
GFR, Estimated: 54 mL/min — ABNORMAL LOW (ref >60)
Glucose, Bld: 144 mg/dL — ABNORMAL HIGH (ref 70–99)
Potassium: 3.9 mmol/L (ref 3.5–5.1)
Sodium: 139 mmol/L (ref 135–145)

## 2021-05-01 LAB — CBC
HCT: 22.6 % — ABNORMAL LOW (ref 36.0–46.0)
Hemoglobin: 7.5 g/dL — ABNORMAL LOW (ref 12.0–15.0)
MCH: 26.7 pg (ref 26.0–34.0)
MCHC: 33.2 g/dL (ref 30.0–36.0)
MCV: 80.4 fL (ref 80.0–100.0)
Platelets: 213 10*3/uL (ref 150–400)
RBC: 2.81 MIL/uL — ABNORMAL LOW (ref 3.87–5.11)
RDW: 17.9 % — ABNORMAL HIGH (ref 11.5–15.5)
WBC: 4.9 10*3/uL (ref 4.0–10.5)
nRBC: 0 % (ref 0.0–0.2)

## 2021-05-01 LAB — HEMOGLOBIN AND HEMATOCRIT, BLOOD
HCT: 21.7 % — ABNORMAL LOW (ref 36.0–46.0)
Hemoglobin: 7.2 g/dL — ABNORMAL LOW (ref 12.0–15.0)

## 2021-05-01 LAB — GLUCOSE, CAPILLARY
Glucose-Capillary: 107 mg/dL — ABNORMAL HIGH (ref 70–99)
Glucose-Capillary: 133 mg/dL — ABNORMAL HIGH (ref 70–99)
Glucose-Capillary: 143 mg/dL — ABNORMAL HIGH (ref 70–99)
Glucose-Capillary: 150 mg/dL — ABNORMAL HIGH (ref 70–99)
Glucose-Capillary: 158 mg/dL — ABNORMAL HIGH (ref 70–99)

## 2021-05-01 MED ORDER — OXYCODONE HCL 5 MG PO TABS
5.0000 mg | ORAL_TABLET | Freq: Four times a day (QID) | ORAL | Status: DC | PRN
  Administered 2021-05-01 – 2021-05-03 (×4): 5 mg via ORAL
  Filled 2021-05-01 (×4): qty 1

## 2021-05-01 MED ORDER — LIDOCAINE 5 % EX PTCH
1.0000 | MEDICATED_PATCH | CUTANEOUS | Status: DC
  Administered 2021-05-01 – 2021-05-03 (×3): 1 via TRANSDERMAL
  Filled 2021-05-01 (×3): qty 1

## 2021-05-01 MED ORDER — CYCLOBENZAPRINE HCL 10 MG PO TABS
5.0000 mg | ORAL_TABLET | Freq: Three times a day (TID) | ORAL | Status: DC | PRN
  Administered 2021-05-03: 10:00:00 5 mg via ORAL
  Filled 2021-05-01: qty 1

## 2021-05-01 NOTE — Consult Note (Signed)
° ° ° °Rohini R Vanga, MD °[address]  °[address]  °[place], [place] [address]  °Main: [phone]  °Fax: [phone] °Pager: [phone] ° ° Consultation ° °Referring Provider:     No ref. provider found °Primary Care Physician:  Bronstein, David, MD °Primary Gastroenterologist:  Dr. Tahiliani         °Reason for Consultation:     Black stools, acute anemia ° °Date of Admission:  04/30/2021 °Date of Consultation:  05/01/2021 °       ° HPI:   °Katherine [name] is a 75 y.o. female with known history of iron deficiency anemia, diabetes, hypertension, hyperlipidemia, hypothyroidism, history of A. fib on Xarelto presented to ER with progressively worsening epigastric pain radiating to back and also syncopal episode secondary to severe anemia.  Patient reports that she has been noticing black stools, however takes iron supplements.  She also has worsening back pain for last 3 weeks for which she has been taking naproxen twice daily.  Admission labs revealed hemoglobin 7, dropped from 10.2 since 12/15.  Patient received 1 unit of PRBCs.  Elevated BUN/creatinine 75/1.48, normal platelets.  Patient underwent CT chest, abdomen and pelvis dissection protocol, no acute intra-abdominal pathology identified.  Patient is started on pantoprazole drip, GI is consulted for further evaluation.  Patient received 2 units of PRBCs.  Patient denies any episodes of melena and she did not have any bowel movement today.  Patient's husband is bedside.  Patient denies feeling dizzy, able to sit up in the bed without feeling lightheaded.  She reports that her abdominal pain is improving.  She is on lidocaine patch for back pain.  She denies any nausea, vomiting.  Patient has been taking iron supplements at home, Pepcid.  Denies taking PPI ° °Patient does not smoke or drink alcohol ° °NSAIDs: Naproxen for last 3 weeks twice daily for back pain ° °Antiplts/Anticoagulants/Anti thrombotics: Xarelto for history of A. fib ° °GI  Procedures: EGD 04/13/2019, normal °- Normal esophagus. °- Widely patent Schatzki ring. °- Erythematous mucosa in the antrum. Biopsied. °- Granular mucosa in the second portion of the duodenum. °- Biopsies were obtained in the gastric body, at the incisura and in the gastric antrum. °- Biopsies were obtained in the duodenal bulb and in the second portion of the duodenum. ° ° °Past Medical History:  °Diagnosis Date  ° Anemia   ° Cancer (HCC)   ° Cystocele   ° Depression   ° Diabetes mellitus without complication (HCC)   ° Glaucoma   ° Hyperlipidemia   ° Hypertension   ° Hypothyroidism   ° ° °Past Surgical History:  °Procedure Laterality Date  ° BREAST EXCISIONAL BIOPSY Left 07/2012  ° CHOLECYSTECTOMY    ° COLONOSCOPY WITH PROPOFOL N/A 08/17/2015  ° Procedure: COLONOSCOPY WITH PROPOFOL;  Surgeon: Matthew Gordon Rein, MD;  Location: [facility];  Service: Endoscopy;  Laterality: N/A;  ° ESOPHAGOGASTRODUODENOSCOPY N/A 02/25/2019  ° Procedure: ESOPHAGOGASTRODUODENOSCOPY (EGD);  Surgeon: Tahiliani, Varnita B, MD;  Location: [facility];  Service: Endoscopy;  Laterality: N/A;  ° ESOPHAGOGASTRODUODENOSCOPY (EGD) WITH PROPOFOL N/A 04/13/2019  ° Procedure: ESOPHAGOGASTRODUODENOSCOPY (EGD) WITH PROPOFOL;  Surgeon: Tahiliani, Varnita B, MD;  Location: [facility];  Service: Endoscopy;  Laterality: N/A;  ° ° °Current Facility-Administered Medications:  °  0.9 %  sodium chloride infusion, , Intravenous, Continuous, Vanga, Rohini Reddy, MD, Held at 04/30/21 1658 °  acetaminophen (TYLENOL) tablet 650 mg, 650 mg, Oral, Q6H PRN, 650 mg at 05/01/21 0018 **OR** acetaminophen (TYLENOL)   TYLENOL) suppository 650 mg, 650 mg, Rectal, Q6H PRN, Lorella Nimrod, MD   cholecalciferol (VITAMIN D3) tablet 1,000 Units, 1,000 Units, Oral, Daily, Lorella Nimrod, MD, 1,000 Units at 05/01/21 0954   cyclobenzaprine (FLEXERIL) tablet 5 mg, 5 mg, Oral, TID PRN, Barb Merino, MD   diazepam (VALIUM) tablet 2.5-5 mg, 2.5-5 mg, Oral, Q8H PRN, Lorella Nimrod,  MD   insulin aspart (novoLOG) injection 0-15 Units, 0-15 Units, Subcutaneous, TID WC, Lorella Nimrod, MD, 2 Units at 05/01/21 1236   insulin aspart (novoLOG) injection 0-5 Units, 0-5 Units, Subcutaneous, QHS, Amin, Soundra Pilon, MD   levothyroxine (SYNTHROID) tablet 150 mcg, 150 mcg, Oral, Daily, Lorella Nimrod, MD, 150 mcg at 05/01/21 0529   lidocaine (LIDODERM) 5 % 1 patch, 1 patch, Transdermal, Q24H, Ghimire, Dante Gang, MD, 1 patch at 05/01/21 1002   metoprolol succinate (TOPROL-XL) 24 hr tablet 25 mg, 25 mg, Oral, Daily, Lorella Nimrod, MD, 25 mg at 05/01/21 0954   ondansetron (ZOFRAN) tablet 4 mg, 4 mg, Oral, Q6H PRN **OR** ondansetron (ZOFRAN) injection 4 mg, 4 mg, Intravenous, Q6H PRN, Lorella Nimrod, MD   oxyCODONE (Oxy IR/ROXICODONE) immediate release tablet 5 mg, 5 mg, Oral, Q6H PRN, Barb Merino, MD   pantoprazole (PROTONIX) 80 mg /NS 100 mL IVPB, 80 mg, Intravenous, Once, Amin, Soundra Pilon, MD, Held at 04/30/21 1657   [START ON 05/04/2021] pantoprazole (PROTONIX) injection 40 mg, 40 mg, Intravenous, Q12H, Amin, Sumayya, MD   pantoprozole (PROTONIX) 80 mg /NS 100 mL infusion, 8 mg/hr, Intravenous, Continuous, Lorella Nimrod, MD, Held at 04/30/21 1658  Family History  Problem Relation Age of Onset   Diabetes Mother    Diabetes Father    Diabetes Brother      Social History   Tobacco Use   Smoking status: Former    Packs/day: 1.00    Years: 8.00    Pack years: 8.00    Types: Cigarettes    Quit date: 05/12/1976    Years since quitting: 45.0   Smokeless tobacco: Never  Vaping Use   Vaping Use: Never used  Substance Use Topics   Alcohol use: Yes    Alcohol/week: 0.0 - 2.0 standard drinks    Comment: occasional, not every week   Drug use: No    Allergies as of 04/30/2021 - Review Complete 04/30/2021  Allergen Reaction Noted   Lipitor [atorvastatin]  08/16/2015   Pravastatin  08/16/2015   Codeine  08/16/2015   Morphine and related  08/16/2015   Vytorin [ezetimibe-simvastatin]   08/16/2015    Review of Systems:    All systems reviewed and negative except where noted in HPI.   Physical Exam:  Vital signs in last 24 hours: Temp:  [97.4 F (36.3 C)-98.4 F (36.9 C)] 98.4 F (36.9 C) (12/21 1157) Pulse Rate:  [64-91] 69 (12/21 1157) Resp:  [10-20] 17 (12/21 1157) BP: (94-142)/(43-81) 110/46 (12/21 1157) SpO2:  [89 %-100 %] 100 % (12/21 1157) Weight:  [109.6 kg] 109.6 kg (12/20 1337) Last BM Date: 04/30/21 General:   Pleasant, cooperative in NAD Head:  Normocephalic and atraumatic. Eyes:   No icterus.   Conjunctiva pale. PERRLA. Ears:  Normal auditory acuity. Neck:  Supple; no masses or thyroidomegaly Lungs: Respirations even and unlabored. Lungs clear to auscultation bilaterally.   No wheezes, crackles, or rhonchi.  Heart:  Regular rate and rhythm;  Without murmur, clicks, rubs or gallops Abdomen:  Soft, nondistended, nontender. Normal bowel sounds. No appreciable masses or hepatomegaly.  No rebound or guarding.  Rectal:  Not performed. Msk:  without gross deformities.  Strength generalized weakness °Extremities:  Without edema, cyanosis or clubbing. °Neurologic:  Alert and oriented x3;  grossly normal neurologically. °Skin:  Intact without significant lesions or rashes. °Psych:  Alert and cooperative. Normal affect. ° °LAB RESULTS: °CBC Latest Ref Rng & Units 05/01/2021 04/30/2021 04/26/2019  °WBC 4.0 - 10.5 K/uL 4.9 5.6 -  °Hemoglobin 12.0 - 15.0 g/dL 7.5(L) 7.1(L) 10.2(L)  °Hematocrit 36.0 - 46.0 % 22.6(L) 22.2(L) -  °Platelets 150 - 400 K/uL 213 272 -  ° ° °BMET °BMP Latest Ref Rng & Units 05/01/2021 04/30/2021 03/14/2019  °Glucose 70 - 99 mg/dL 144(H) 247(H) 75  °BUN 8 - 23 mg/dL 65(H) 75(H) 40(H)  °Creatinine 0.44 - 1.00 mg/dL 1.09(H) 1.48(H) 1.24(H)  °Sodium 135 - 145 mmol/L 139 136 138  °Potassium 3.5 - 5.1 mmol/L 3.9 4.1 3.4(L)  °Chloride 98 - 111 mmol/L 109 105 110  °CO2 22 - 32 mmol/L 27 24 22  °Calcium 8.9 - 10.3 mg/dL 9.4 9.6 7.9(L)   ° ° °LFT °Hepatic Function Latest Ref Rng & Units 04/30/2021 03/13/2019 02/24/2019  °Total Protein 6.5 - 8.1 g/dL 5.9(L) 6.9 5.2(L)  °Albumin 3.5 - 5.0 g/dL 3.1(L) 3.9 3.1(L)  °AST 15 - 41 U/L 15 28 15  °ALT 0 - 44 U/L 10 18 15  °Alk Phosphatase 38 - 126 U/L 59 46 31(L)  °Total Bilirubin 0.3 - 1.2 mg/dL 0.7 0.6 0.5  ° ° ° °STUDIES: °MR THORACIC SPINE WO CONTRAST ° °Result Date: 04/30/2021 °CLINICAL DATA:  Initial evaluation for acute mid back pain, compression fracture. EXAM: MRI THORACIC AND LUMBAR SPINE WITHOUT CONTRAST TECHNIQUE: Multiplanar and multiecho pulse sequences of the thoracic and lumbar spine were obtained without intravenous contrast. COMPARISON:  Prior CT from earlier the same day. FINDINGS: MRI THORACIC SPINE FINDINGS Alignment: Physiologic with preservation of the normal thoracic kyphosis. No listhesis. Vertebrae: Acute compression fracture involving the superior endplate of T11 with mild 20% height loss and trace 2 mm bony retropulsion. No significant spinal stenosis. Otherwise, vertebral body height maintained with no other acute or chronic fracture. Bone marrow signal intensity within normal limits. No worrisome osseous lesions. Discogenic reactive endplate change present about the T6-7 interspace. No other abnormal marrow edema. Cord: Normal signal morphology. Incidental note made of a 1.2 cm well-circumscribed cystic lesion at the posterior epidural space at the level of T4-5 (series 21, image 7), nonspecific, likely a small arachnoid cyst. Finding felt to be of doubtful significance. Paraspinal and other soft tissues: Paraspinous soft tissues demonstrate no acute finding. Disc levels: T6-7: Degenerative disc bulge with reactive endplate change. No significant spinal stenosis. Foramina remain patent. T7-8: Small central disc protrusion indents the ventral thecal sac (series 23, image 23). Minimal flattening of the ventral cord without significant spinal stenosis or cord signal changes.  Foramina remain patent. Otherwise, no other significant disc pathology seen within the thoracic spine for age. No other stenosis or neural impingement. MRI LUMBAR SPINE FINDINGS Segmentation: Standard. Lowest well-formed disc space labeled the L5-S1 level. Alignment: Physiologic with preservation of the normal lumbar lordosis. No listhesis. Vertebrae: Chronic compression fracture involving the superior endplate of L2 with up to 50% height loss and trace 2 mm bony retropulsion. Otherwise, vertebral body height maintained with no other acute or chronic fracture. Bone marrow signal intensity within normal limits. No worrisome osseous lesions. No abnormal marrow edema. Conus medullaris and cauda equina: Conus extends to the T12. Level. Conus and cauda equina appear normal. Paraspinal and other soft tissues: Unremarkable.   Disc levels: L1-2: Mild disc bulge with disc desiccation. Trace bony retropulsion related to the chronic T12 compression fracture. Mild facet hypertrophy. No spinal stenosis. Foramina remain patent. L2-3: Minimal annular disc bulge with mild facet hypertrophy. No canal or foraminal stenosis. L3-4: Disc desiccation with mild annular disc bulge. Superimposed small right foraminal to extraforaminal disc protrusion closely approximates the exiting right L3 nerve root (series 19, image 22). Mild facet hypertrophy with small joint effusions. No spinal stenosis. Foramina remain patent. L4-5: Disc desiccation with minimal annular disc bulge. Mild facet hypertrophy with trace joint effusions. No spinal stenosis. Foramina remain patent. L5-S1: Negative interspace. No significant canal or lateral recess stenosis. Foramina remain patent. IMPRESSION: MR THORACIC SPINE IMPRESSION: 1. Acute compression fracture involving the superior endplate of T11 with mild 20% height loss and trace 2 mm bony retropulsion. No significant spinal stenosis. 2. No other acute abnormality within the thoracic spine. 3. Small central disc  protrusion at T7-8 with minimal cord flattening, but no cord signal changes or significant stenosis. MR LUMBAR SPINE IMPRESSION: 1. No acute abnormality within the lumbar spine. 2. Chronic compression fracture involving the superior endplate of L2 with up to 50% height loss and trace 2 mm bony retropulsion. No significant stenosis. 3. Small right foraminal to extraforaminal disc protrusion at L3-4, closely approximating and potentially irritating the exiting right L3 nerve root. Electronically Signed   By: Benjamin  McClintock M.D.   On: 04/30/2021 23:42  ° °MR LUMBAR SPINE WO CONTRAST ° °Result Date: 04/30/2021 °CLINICAL DATA:  Initial evaluation for acute mid back pain, compression fracture. EXAM: MRI THORACIC AND LUMBAR SPINE WITHOUT CONTRAST TECHNIQUE: Multiplanar and multiecho pulse sequences of the thoracic and lumbar spine were obtained without intravenous contrast. COMPARISON:  Prior CT from earlier the same day. FINDINGS: MRI THORACIC SPINE FINDINGS Alignment: Physiologic with preservation of the normal thoracic kyphosis. No listhesis. Vertebrae: Acute compression fracture involving the superior endplate of T11 with mild 20% height loss and trace 2 mm bony retropulsion. No significant spinal stenosis. Otherwise, vertebral body height maintained with no other acute or chronic fracture. Bone marrow signal intensity within normal limits. No worrisome osseous lesions. Discogenic reactive endplate change present about the T6-7 interspace. No other abnormal marrow edema. Cord: Normal signal morphology. Incidental note made of a 1.2 cm well-circumscribed cystic lesion at the posterior epidural space at the level of T4-5 (series 21, image 7), nonspecific, likely a small arachnoid cyst. Finding felt to be of doubtful significance. Paraspinal and other soft tissues: Paraspinous soft tissues demonstrate no acute finding. Disc levels: T6-7: Degenerative disc bulge with reactive endplate change. No significant spinal  stenosis. Foramina remain patent. T7-8: Small central disc protrusion indents the ventral thecal sac (series 23, image 23). Minimal flattening of the ventral cord without significant spinal stenosis or cord signal changes. Foramina remain patent. Otherwise, no other significant disc pathology seen within the thoracic spine for age. No other stenosis or neural impingement. MRI LUMBAR SPINE FINDINGS Segmentation: Standard. Lowest well-formed disc space labeled the L5-S1 level. Alignment: Physiologic with preservation of the normal lumbar lordosis. No listhesis. Vertebrae: Chronic compression fracture involving the superior endplate of L2 with up to 50% height loss and trace 2 mm bony retropulsion. Otherwise, vertebral body height maintained with no other acute or chronic fracture. Bone marrow signal intensity within normal limits. No worrisome osseous lesions. No abnormal marrow edema. Conus medullaris and cauda equina: Conus extends to the T12. Level. Conus and cauda equina appear normal. Paraspinal and other soft tissues:   Unremarkable. Disc levels: L1-2: Mild disc bulge with disc desiccation. Trace bony retropulsion related to the chronic T12 compression fracture. Mild facet hypertrophy. No spinal stenosis. Foramina remain patent. L2-3: Minimal annular disc bulge with mild facet hypertrophy. No canal or foraminal stenosis. L3-4: Disc desiccation with mild annular disc bulge. Superimposed small right foraminal to extraforaminal disc protrusion closely approximates the exiting right L3 nerve root (series 19, image 22). Mild facet hypertrophy with small joint effusions. No spinal stenosis. Foramina remain patent. L4-5: Disc desiccation with minimal annular disc bulge. Mild facet hypertrophy with trace joint effusions. No spinal stenosis. Foramina remain patent. L5-S1: Negative interspace. No significant canal or lateral recess stenosis. Foramina remain patent. IMPRESSION: MR THORACIC SPINE IMPRESSION: 1. Acute  compression fracture involving the superior endplate of T11 with mild 20% height loss and trace 2 mm bony retropulsion. No significant spinal stenosis. 2. No other acute abnormality within the thoracic spine. 3. Small central disc protrusion at T7-8 with minimal cord flattening, but no cord signal changes or significant stenosis. MR LUMBAR SPINE IMPRESSION: 1. No acute abnormality within the lumbar spine. 2. Chronic compression fracture involving the superior endplate of L2 with up to 50% height loss and trace 2 mm bony retropulsion. No significant stenosis. 3. Small right foraminal to extraforaminal disc protrusion at L3-4, closely approximating and potentially irritating the exiting right L3 nerve root. Electronically Signed   By: Benjamin  McClintock M.D.   On: 04/30/2021 23:42  ° °CT Angio Chest/Abd/Pel for Dissection W and/or W/WO ° °Result Date: 04/30/2021 °CLINICAL DATA:  Abdominal pain, severe epigastric and back pain, possible abdominal aortic aneurysm, possible perforated ulcer EXAM: CT ANGIOGRAPHY CHEST, ABDOMEN AND PELVIS TECHNIQUE: Non-contrast CT of the chest was initially obtained. Multidetector CT imaging through the chest, abdomen and pelvis was performed using the standard protocol during bolus administration of intravenous contrast. Multiplanar reconstructed images and MIPs were obtained and reviewed to evaluate the vascular anatomy. CONTRAST:  100mL OMNIPAQUE IOHEXOL 350 MG/ML SOLN COMPARISON:  03/13/2019 FINDINGS: CTA CHEST FINDINGS Cardiovascular: Preferential opacification of the thoracic aorta. Normal contour and caliber of the thoracic aorta without evidence of aneurysm, dissection, or other acute aortic pathology. Normal heart size. Left coronary artery calcifications. No pericardial effusion. Mild mixed calcific aortic atherosclerosis. Mediastinum/Nodes: No enlarged mediastinal, hilar, or axillary lymph nodes. Small hiatal hernia. Thyroid gland, trachea, and esophagus demonstrate no  significant findings. Lungs/Pleura: Lungs are clear. No pleural effusion or pneumothorax. Musculoskeletal: No chest wall abnormality. Age indeterminate wedge deformity of T11. Nonacute fracture of the anterior left fifth rib (series 5, image 72). Review of the MIP images confirms the above findings. CTA ABDOMEN AND PELVIS FINDINGS VASCULAR Normal contour and caliber of the abdominal aorta. No evidence of aneurysm, dissection, or other acute aortic pathology. 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 The branch vessel ostia are patent. Moderate mixed calcific atherosclerosis. Review of the MIP images confirms the above findings. NON-VASCULAR Hepatobiliary: No focal liver abnormality is seen. Status post cholecystectomy. No biliary dilatation. Pancreas: Unremarkable. No pancreatic ductal dilatation or surrounding inflammatory changes. Spleen: Normal in size without significant abnormality. Adrenals/Urinary Tract: Adrenal glands are unremarkable. Kidneys are normal, without renal calculi, solid lesion, or hydronephrosis. Bladder is unremarkable. Stomach/Bowel: Stomach is within normal limits. Appendix appears normal. No evidence of bowel wall thickening, distention, or inflammatory changes. Lymphatic: No enlarged abdominal or pelvic lymph nodes. Reproductive: No mass or other significant abnormality. Other: No abdominal wall hernia   hernia or abnormality. No abdominopelvic ascites. Musculoskeletal: Age indeterminate wedge deformity of L2. Review of the MIP images confirms the above findings. IMPRESSION: 1. Normal contour and caliber of the thoracic and abdominal aorta without evidence of aneurysm, dissection, or other acute aortic pathology. Mild-to-moderate mixed calcific atherosclerosis. 2. No acute CT findings of the abdomen or pelvis to explain pain on this early arterial phase examination. 3. Age indeterminate wedge deformities of  T11 and L2. Correlate for acute point tenderness. 4. Small hiatal hernia. 5. Coronary artery disease. Aortic Atherosclerosis (ICD10-I70.0). Electronically Signed   By: Delanna Ahmadi M.D.   On: 04/30/2021 14:29      Impression / Plan:   FAWN DESROCHER is a 75 y.o. female with metabolic syndrome, obesity, history of A. fib on Xarelto, history of chronic iron deficiency anemia of unclear etiology presented with epigastric pain, back pain, melena, worsening anemia with syncope  Given history of NSAID use and on Xarelto, patient is at risk for GI bleed from peptic ulcer disease or small bowel AVMs or erosive gastritis. No active bleeding at this time Xarelto has been held since 12/20 Recommend EGD tomorrow for further evaluation N.p.o. effective 5 AM tomorrow Discussed with patient to avoid NSAID use Continue pantoprazole drip Recheck iron studies, B12 and folate levels, replete as needed Close follow-up with GI upon discharge  Thank you for involving me in the care of this patient.      LOS: 1 day   Sherri Sear, MD  05/01/2021, 1:05 PM    Note: This dictation was prepared with Dragon dictation along with smaller phrase technology. Any transcriptional errors that result from this process are unintentional.

## 2021-05-01 NOTE — Plan of Care (Signed)

## 2021-05-01 NOTE — Plan of Care (Signed)
  Problem: Pain Managment: Goal: General experience of comfort will improve Outcome: Progressing   Problem: Safety: Goal: Ability to remain free from injury will improve Outcome: Progressing   Problem: Skin Integrity: Goal: Risk for impaired skin integrity will decrease Outcome: Progressing   

## 2021-05-01 NOTE — Progress Notes (Signed)
PROGRESS NOTE    Katherine [name]  OJJ:[id number] DOB: 1946-03-07 DOA: 04/30/2021 PCP: [name], MD    Brief Narrative:  75 year old with history of paroxysmal A. fib on Xarelto, iron-deficiency anemia, type 2 diabetes and hypothyroidism presented with sudden onset of worsening epigastric pain radiating to the back and extreme weakness.  Apparently suffering from back pain and taking iron supplements along with Naprosyn twice daily for the last 3 weeks.  Hemoglobin 7.1.  Hemodynamically stable.   Assessment & Plan:   Principal Problem:   GI bleed  Acute upper GI bleed possibly secondary to NSAID use along with coagulopathy from Xarelto. Anemia of acute blood loss. Currently hemodynamically stabilized. Hemoglobin 7.1-2 units transfusion-7.5.  Continue checking hemoglobin every 12 hours.  Transfuse for less than 7. Protonix IV infusion ongoing. Followed by gastroenterology.  Is scheduled for upper GI endoscopy tomorrow.  Allow liquid diet.  Paroxysmal A. fib: Rate controlled.  Anticoagulation on hold.  Continue metoprolol.  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  No acute neurological deficits. Avoid NSAIDs. Lidocaine patch, low-dose opiates, muscle relaxants and Valium and start mobilizing.  Type 2 diabetes: Well-controlled.  On metformin, Amaryl and Januvia at home.  Sliding scale insulin in the hospital.  DVT prophylaxis: SCDs Start: 04/30/21 1623   Code Status: Full code/partial code Family Communication: None Disposition Plan: Status is: Inpatient  Remains inpatient appropriate because: Significant blood loss anemia.  Requiring inpatient procedures.         Consultants:  Gastroenterology  Procedures:  None  Antimicrobials:  None   Subjective: Patient seen and examined.  Back pain persist on attempted mobility.  She wants to go to bathroom by herself.  Denies any nausea or vomiting.  No abdominal  pain.  No bowel movement since admission.  Feels extremely weak on getting up but felt slightly better after 2 units of PRBC.  Objective: Vitals:   04/30/21 2048 05/01/21 0028 05/01/21 0335 05/01/21 0749  BP: (!) 142/62 (!) 137/56 97/60 (!) 106/43  Pulse: 68 64 69 73  Resp: 10 18 15 17   Temp: 97.9 F (36.6 C) 98 F (36.7 C) 98.2 F (36.8 C) 98.4 F (36.9 C)  TempSrc: Oral  Oral   SpO2: 99% 100% 93% 100%  Weight:      Height:        Intake/Output Summary (Last 24 hours) at 05/01/2021 1149 Last data filed at 05/01/2021 1052 Gross per 24 hour  Intake 1087.99 ml  Output --  Net 1087.99 ml   Filed Weights   04/30/21 1337  Weight: 109.6 kg    Examination:  General exam: Appears calm and comfortable, not in any distress. Respiratory system: Clear to auscultation. Respiratory effort normal. Cardiovascular system: S1 & S2 heard, RRR.  No edema.   Gastrointestinal system: Abdomen is nondistended, soft and nontender. No organomegaly or masses felt. Normal bowel sounds heard. Central nervous system: Alert and oriented. No focal neurological deficits. Extremities: Symmetric 5 x 5 power. Skin: No rashes, lesions or ulcers Psychiatry: Judgement and insight appear normal. Mood & affect appropriate.     Data Reviewed: I have personally reviewed following labs and imaging studies  CBC: Recent Labs  Lab 04/30/21 1344 05/01/21 0313  WBC 5.6 4.9  NEUTROABS 4.0  --   HGB 7.1* 7.5*  HCT 22.2* 22.6*  MCV 81.3 80.4  PLT 272 937   Basic Metabolic Panel: Recent Labs  Lab 04/30/21 1344 05/01/21 0313  NA 136 139  K 4.1 3.9  CL 105 109  CO2 24 27  GLUCOSE 247* 144*  BUN 75* 65*  CREATININE 1.48* 1.09*  CALCIUM 9.6 9.4   GFR: Estimated Creatinine Clearance: 58.6 mL/min (A) (by C-G formula based on SCr of 1.09 mg/dL (H)). Liver Function Tests: Recent Labs  Lab 04/30/21 1344  AST 15  ALT 10  ALKPHOS 59  BILITOT 0.7  PROT 5.9*  ALBUMIN 3.1*   Recent Labs  Lab  04/30/21 1344  LIPASE 27   No results for input(s): AMMONIA in the last 168 hours. Coagulation Profile: No results for input(s): INR, PROTIME in the last 168 hours. Cardiac Enzymes: No results for input(s): CKTOTAL, CKMB, CKMBINDEX, TROPONINI in the last 168 hours. BNP (last 3 results) No results for input(s): PROBNP in the last 8760 hours. HbA1C: No results for input(s): HGBA1C in the last 72 hours. CBG: Recent Labs  Lab 04/30/21 2020 05/01/21 0002 05/01/21 0750  GLUCAP 166* 143* 158*   Lipid Profile: No results for input(s): CHOL, HDL, LDLCALC, TRIG, CHOLHDL, LDLDIRECT in the last 72 hours. Thyroid Function Tests: No results for input(s): TSH, T4TOTAL, FREET4, T3FREE, THYROIDAB in the last 72 hours. Anemia Panel: Recent Labs    04/30/21 1344  VITAMINB12 168*  FOLATE 14.6  FERRITIN 5*  TIBC 360  IRON 30   Sepsis Labs: Recent Labs  Lab 04/30/21 1341 04/30/21 1620  LATICACIDVEN 2.0* 1.4    Recent Results (from the past 240 hour(s))  Resp Panel by RT-PCR (Flu A&B, Covid) Nasopharyngeal Swab     Status: None   Collection Time: 04/30/21  4:20 PM   Specimen: Nasopharyngeal Swab; Nasopharyngeal(NP) swabs in vial transport medium  Result Value Ref Range Status   SARS Coronavirus 2 by RT PCR NEGATIVE NEGATIVE Final    Comment: (NOTE) SARS-CoV-2 target nucleic acids are NOT DETECTED.  The SARS-CoV-2 RNA is generally detectable in upper respiratory specimens during the acute phase of infection. The lowest concentration of SARS-CoV-2 viral copies this assay can detect is 138 copies/mL. A negative result does not preclude SARS-Cov-2 infection and should not be used as the sole basis for treatment or other patient management decisions. A negative result may occur with  improper specimen collection/handling, submission of specimen other than nasopharyngeal swab, presence of viral mutation(s) within the areas targeted by this assay, and inadequate number of  viral copies(<138 copies/mL). A negative result must be combined with clinical observations, patient history, and epidemiological information. The expected result is Negative.  Fact Sheet for Patients:  EntrepreneurPulse.com.au  Fact Sheet for Healthcare Providers:  IncredibleEmployment.be  This test is no t yet approved or cleared by the [place] FDA and  has been authorized for detection and/or diagnosis of SARS-CoV-2 by FDA under an Emergency Use Authorization (EUA). This EUA will remain  in effect (meaning this test can be used) for the duration of the COVID-19 declaration under Section 564(b)(1) of the Act, 21 U.S.C.section 360bbb-3(b)(1), unless the authorization is terminated  or revoked sooner.       Influenza A by PCR NEGATIVE NEGATIVE Final   Influenza B by PCR NEGATIVE NEGATIVE Final    Comment: (NOTE) The Xpert Xpress SARS-CoV-2/FLU/RSV plus assay is intended as an aid in the diagnosis of influenza from Nasopharyngeal swab specimens and should not be used as a sole basis for treatment. Nasal washings and aspirates are unacceptable for Xpert Xpress SARS-CoV-2/FLU/RSV testing.  Fact Sheet for Patients: EntrepreneurPulse.com.au  Fact Sheet for Healthcare Providers: IncredibleEmployment.be  This test is not  yet approved or cleared by the [place] and has been authorized for detection and/or diagnosis of SARS-CoV-2 by FDA under an Emergency Use Authorization (EUA). This EUA will remain in effect (meaning this test can be used) for the duration of the COVID-19 declaration under Section 564(b)(1) of the Act, 21 U.S.C. section 360bbb-3(b)(1), unless the authorization is terminated or revoked.  Performed at [facility], [address] [place], [place] [address]          Radiology Studies: MR THORACIC SPINE WO CONTRAST  Result Date: 04/30/2021 CLINICAL DATA:   Initial evaluation for acute mid back pain, compression fracture. EXAM: MRI THORACIC AND LUMBAR SPINE WITHOUT CONTRAST TECHNIQUE: Multiplanar and multiecho pulse sequences of the thoracic and lumbar spine were obtained without intravenous contrast. COMPARISON:  Prior CT from earlier the same day. FINDINGS: MRI THORACIC SPINE FINDINGS Alignment: Physiologic with preservation of the normal thoracic kyphosis. No listhesis. Vertebrae: Acute compression fracture involving the superior endplate of Q65 with mild 20% height loss and trace 2 mm bony retropulsion. No significant spinal stenosis. Otherwise, vertebral body height maintained with no other acute or chronic fracture. Bone marrow signal intensity within normal limits. No worrisome osseous lesions. Discogenic reactive endplate change present about the T6-7 interspace. No other abnormal marrow edema. Cord: Normal signal morphology. Incidental note made of a 1.2 cm well-circumscribed cystic lesion at the posterior epidural space at the level of T4-5 (series 21, image 7), nonspecific, likely a small arachnoid cyst. Finding felt to be of doubtful significance. Paraspinal and other soft tissues: Paraspinous soft tissues demonstrate no acute finding. Disc levels: T6-7: Degenerative disc bulge with reactive endplate change. No significant spinal stenosis. Foramina remain patent. T7-8: Small central disc protrusion indents the ventral thecal sac (series 23, image 23). Minimal flattening of the ventral cord without significant spinal stenosis or cord signal changes. Foramina remain patent. Otherwise, no other significant disc pathology seen within the thoracic spine for age. No other stenosis or neural impingement. MRI LUMBAR SPINE FINDINGS Segmentation: Standard. Lowest well-formed disc space labeled the L5-S1 level. Alignment: Physiologic with preservation of the normal lumbar lordosis. No listhesis. Vertebrae: Chronic compression fracture involving the superior endplate  of L2 with up to 50% height loss and trace 2 mm bony retropulsion. Otherwise, vertebral body height maintained with no other acute or chronic fracture. Bone marrow signal intensity within normal limits. No worrisome osseous lesions. No abnormal marrow edema. Conus medullaris and cauda equina: Conus extends to the T12. Level. Conus and cauda equina appear normal. Paraspinal and other soft tissues: Unremarkable. Disc levels: L1-2: Mild disc bulge with disc desiccation. Trace bony retropulsion related to the chronic T12 compression fracture. Mild facet hypertrophy. No spinal stenosis. Foramina remain patent. L2-3: Minimal annular disc bulge with mild facet hypertrophy. No canal or foraminal stenosis. L3-4: Disc desiccation with mild annular disc bulge. Superimposed small right foraminal to extraforaminal disc protrusion closely approximates the exiting right L3 nerve root (series 19, image 22). Mild facet hypertrophy with small joint effusions. No spinal stenosis. Foramina remain patent. L4-5: Disc desiccation with minimal annular disc bulge. Mild facet hypertrophy with trace joint effusions. No spinal stenosis. Foramina remain patent. L5-S1: Negative interspace. No significant canal or lateral recess stenosis. Foramina remain patent. IMPRESSION: MR THORACIC SPINE IMPRESSION: 1. Acute compression fracture involving the superior endplate of H84 with mild 20% height loss and trace 2 mm bony retropulsion. No significant spinal stenosis. 2. No other acute abnormality within the thoracic spine. 3. Small central disc protrusion  at T7-8 with minimal cord flattening, but no cord signal changes or significant stenosis. MR LUMBAR SPINE IMPRESSION: 1. No acute abnormality within the lumbar spine. 2. Chronic compression fracture involving the superior endplate of L2 with up to 50% height loss and trace 2 mm bony retropulsion. No significant stenosis. 3. Small right foraminal to extraforaminal disc protrusion at L3-4, closely  approximating and potentially irritating the exiting right L3 nerve root. Electronically Signed   By: Jeannine Boga M.D.   On: 04/30/2021 23:42   MR LUMBAR SPINE WO CONTRAST  Result Date: 04/30/2021 CLINICAL DATA:  Initial evaluation for acute mid back pain, compression fracture. EXAM: MRI THORACIC AND LUMBAR SPINE WITHOUT CONTRAST TECHNIQUE: Multiplanar and multiecho pulse sequences of the thoracic and lumbar spine were obtained without intravenous contrast. COMPARISON:  Prior CT from earlier the same day. FINDINGS: MRI THORACIC SPINE FINDINGS Alignment: Physiologic with preservation of the normal thoracic kyphosis. No listhesis. Vertebrae: Acute compression fracture involving the superior endplate of J19 with mild 20% height loss and trace 2 mm bony retropulsion. No significant spinal stenosis. Otherwise, vertebral body height maintained with no other acute or chronic fracture. Bone marrow signal intensity within normal limits. No worrisome osseous lesions. Discogenic reactive endplate change present about the T6-7 interspace. No other abnormal marrow edema. Cord: Normal signal morphology. Incidental note made of a 1.2 cm well-circumscribed cystic lesion at the posterior epidural space at the level of T4-5 (series 21, image 7), nonspecific, likely a small arachnoid cyst. Finding felt to be of doubtful significance. Paraspinal and other soft tissues: Paraspinous soft tissues demonstrate no acute finding. Disc levels: T6-7: Degenerative disc bulge with reactive endplate change. No significant spinal stenosis. Foramina remain patent. T7-8: Small central disc protrusion indents the ventral thecal sac (series 23, image 23). Minimal flattening of the ventral cord without significant spinal stenosis or cord signal changes. Foramina remain patent. Otherwise, no other significant disc pathology seen within the thoracic spine for age. No other stenosis or neural impingement. MRI LUMBAR SPINE FINDINGS  Segmentation: Standard. Lowest well-formed disc space labeled the L5-S1 level. Alignment: Physiologic with preservation of the normal lumbar lordosis. No listhesis. Vertebrae: Chronic compression fracture involving the superior endplate of L2 with up to 50% height loss and trace 2 mm bony retropulsion. Otherwise, vertebral body height maintained with no other acute or chronic fracture. Bone marrow signal intensity within normal limits. No worrisome osseous lesions. No abnormal marrow edema. Conus medullaris and cauda equina: Conus extends to the T12. Level. Conus and cauda equina appear normal. Paraspinal and other soft tissues: Unremarkable. Disc levels: L1-2: Mild disc bulge with disc desiccation. Trace bony retropulsion related to the chronic T12 compression fracture. Mild facet hypertrophy. No spinal stenosis. Foramina remain patent. L2-3: Minimal annular disc bulge with mild facet hypertrophy. No canal or foraminal stenosis. L3-4: Disc desiccation with mild annular disc bulge. Superimposed small right foraminal to extraforaminal disc protrusion closely approximates the exiting right L3 nerve root (series 19, image 22). Mild facet hypertrophy with small joint effusions. No spinal stenosis. Foramina remain patent. L4-5: Disc desiccation with minimal annular disc bulge. Mild facet hypertrophy with trace joint effusions. No spinal stenosis. Foramina remain patent. L5-S1: Negative interspace. No significant canal or lateral recess stenosis. Foramina remain patent. IMPRESSION: MR THORACIC SPINE IMPRESSION: 1. Acute compression fracture involving the superior endplate of E17 with mild 20% height loss and trace 2 mm bony retropulsion. No significant spinal stenosis. 2. No other acute abnormality within the thoracic spine. 3. Small central disc  protrusion at T7-8 with minimal cord flattening, but no cord signal changes or significant stenosis. MR LUMBAR SPINE IMPRESSION: 1. No acute abnormality within the lumbar spine.  2. Chronic compression fracture involving the superior endplate of L2 with up to 50% height loss and trace 2 mm bony retropulsion. No significant stenosis. 3. Small right foraminal to extraforaminal disc protrusion at L3-4, closely approximating and potentially irritating the exiting right L3 nerve root. Electronically Signed   By: Jeannine Boga M.D.   On: 04/30/2021 23:42   CT Angio Chest/Abd/Pel for Dissection W and/or W/WO  Result Date: 04/30/2021 CLINICAL DATA:  Abdominal pain, severe epigastric and back pain, possible abdominal aortic aneurysm, possible perforated ulcer EXAM: CT ANGIOGRAPHY CHEST, ABDOMEN AND PELVIS TECHNIQUE: Non-contrast CT of the chest was initially obtained. Multidetector CT imaging through the chest, abdomen and pelvis was performed using the standard protocol during bolus administration of intravenous contrast. Multiplanar reconstructed images and MIPs were obtained and reviewed to evaluate the vascular anatomy. CONTRAST:  168mL OMNIPAQUE IOHEXOL 350 MG/ML SOLN COMPARISON:  03/13/2019 FINDINGS: CTA CHEST FINDINGS Cardiovascular: Preferential opacification of the thoracic aorta. Normal contour and caliber of the thoracic aorta without evidence of aneurysm, dissection, or other acute aortic pathology. Normal heart size. Left coronary artery calcifications. No pericardial effusion. Mild mixed calcific aortic atherosclerosis. Mediastinum/Nodes: No enlarged mediastinal, hilar, or axillary lymph nodes. Small hiatal hernia. Thyroid gland, trachea, and esophagus demonstrate no significant findings. Lungs/Pleura: Lungs are clear. No pleural effusion or pneumothorax. Musculoskeletal: No chest wall abnormality. Age indeterminate wedge deformity of T11. Nonacute fracture of the anterior left fifth rib (series 5, image 72). Review of the MIP images confirms the above findings. CTA ABDOMEN AND PELVIS FINDINGS VASCULAR Normal contour and caliber of the abdominal aorta. No evidence of  aneurysm, dissection, or other acute aortic pathology. 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 The branch vessel ostia are patent. Moderate mixed calcific atherosclerosis. Review of the MIP images confirms the above findings. NON-VASCULAR Hepatobiliary: No focal liver abnormality is seen. Status post cholecystectomy. No biliary dilatation. Pancreas: Unremarkable. No pancreatic ductal dilatation or surrounding inflammatory changes. Spleen: Normal in size without significant abnormality. Adrenals/Urinary Tract: Adrenal glands are unremarkable. Kidneys are normal, without renal calculi, solid lesion, or hydronephrosis. Bladder is unremarkable. Stomach/Bowel: Stomach is within normal limits. Appendix appears normal. No evidence of bowel wall thickening, distention, or inflammatory changes. Lymphatic: No enlarged abdominal or pelvic lymph nodes. Reproductive: No mass or other significant abnormality. Other: No abdominal wall hernia or abnormality. No abdominopelvic ascites. Musculoskeletal: Age indeterminate wedge deformity of L2. Review of the MIP images confirms the above findings. IMPRESSION: 1. Normal contour and caliber of the thoracic and abdominal aorta without evidence of aneurysm, dissection, or other acute aortic pathology. Mild-to-moderate mixed calcific atherosclerosis. 2. No acute CT findings of the abdomen or pelvis to explain pain on this early arterial phase examination. 3. Age indeterminate wedge deformities of T11 and L2. Correlate for acute point tenderness. 4. Small hiatal hernia. 5. Coronary artery disease. Aortic Atherosclerosis (ICD10-I70.0). Electronically Signed   By: Delanna Ahmadi M.D.   On: 04/30/2021 14:29        Scheduled Meds:  cholecalciferol  1,000 Units Oral Daily   insulin aspart  0-15 Units Subcutaneous TID WC   insulin aspart  0-5 Units Subcutaneous QHS   levothyroxine  150 mcg Oral  Daily   lidocaine  1 patch Transdermal Q24H   metoprolol succinate  25 mg Oral Daily   [START ON 05/04/2021] pantoprazole  40 mg Intravenous Q12H   Continuous Infusions:  sodium chloride Stopped (04/30/21 1658)   pantoprazole Stopped (04/30/21 1657)   pantoprazole Stopped (04/30/21 1658)     LOS: 1 day    Time spent: 30 minutes    Barb Merino, MD [facility] Pager [phone]

## 2021-05-02 ENCOUNTER — Encounter: Payer: Self-pay | Admitting: Internal Medicine

## 2021-05-02 ENCOUNTER — Encounter
Admission: EM | Disposition: A | Discharge status: Home / Self Care | Payer: Self-pay | Source: Home / Self Care | Attending: Internal Medicine

## 2021-05-02 ENCOUNTER — Inpatient Hospital Stay: Payer: Medicare PPO | Admitting: Anesthesiology

## 2021-05-02 DIAGNOSIS — D62 Acute posthemorrhagic anemia: Secondary | ICD-10-CM

## 2021-05-02 DIAGNOSIS — K253 Acute gastric ulcer without hemorrhage or perforation: Secondary | ICD-10-CM

## 2021-05-02 HISTORY — PX: ESOPHAGOGASTRODUODENOSCOPY: SHX5428

## 2021-05-02 LAB — BASIC METABOLIC PANEL
Anion gap: 4 — ABNORMAL LOW (ref 5–15)
BUN: 46 mg/dL — ABNORMAL HIGH (ref 8–23)
CO2: 26 mmol/L (ref 22–32)
Calcium: 8.9 mg/dL (ref 8.9–10.3)
Chloride: 109 mmol/L (ref 98–111)
Creatinine, Ser: 0.9 mg/dL (ref 0.44–1.00)
GFR, Estimated: >60 mL/min (ref >60)
Glucose, Bld: 135 mg/dL — ABNORMAL HIGH (ref 70–99)
Potassium: 4 mmol/L (ref 3.5–5.1)
Sodium: 139 mmol/L (ref 135–145)

## 2021-05-02 LAB — GLUCOSE, CAPILLARY
Glucose-Capillary: 155 mg/dL — ABNORMAL HIGH (ref 70–99)
Glucose-Capillary: 134 mg/dL — ABNORMAL HIGH (ref 70–99)
Glucose-Capillary: 127 mg/dL — ABNORMAL HIGH (ref 70–99)
Glucose-Capillary: 118 mg/dL — ABNORMAL HIGH (ref 70–99)
Glucose-Capillary: 167 mg/dL — ABNORMAL HIGH (ref 70–99)

## 2021-05-02 LAB — MAGNESIUM: Magnesium: 1.9 mg/dL (ref 1.7–2.4)

## 2021-05-02 LAB — HEMOGLOBIN AND HEMATOCRIT, BLOOD
HCT: 21.2 % — ABNORMAL LOW (ref 36.0–46.0)
HCT: 26.1 % — ABNORMAL LOW (ref 36.0–46.0)
Hemoglobin: 6.9 g/dL — ABNORMAL LOW (ref 12.0–15.0)
Hemoglobin: 8.5 g/dL — ABNORMAL LOW (ref 12.0–15.0)

## 2021-05-02 LAB — HEMOGLOBIN A1C
Hgb A1c MFr Bld: 6.4 % — ABNORMAL HIGH (ref 4.8–5.6)
Mean Plasma Glucose: 137 mg/dL

## 2021-05-02 LAB — PREPARE RBC (CROSSMATCH)

## 2021-05-02 LAB — PHOSPHORUS: Phosphorus: 3.4 mg/dL (ref 2.5–4.6)

## 2021-05-02 SURGERY — EGD (ESOPHAGOGASTRODUODENOSCOPY)
Anesthesia: General

## 2021-05-02 SURGERY — ESOPHAGOGASTRODUODENOSCOPY (EGD) WITH PROPOFOL
Anesthesia: General

## 2021-05-02 MED ORDER — SODIUM CHLORIDE 0.9 % IV SOLN
300.0000 mg | Freq: Once | INTRAVENOUS | Status: AC
  Administered 2021-05-02: 17:00:00 300 mg via INTRAVENOUS
  Filled 2021-05-02: qty 300

## 2021-05-02 MED ORDER — SODIUM CHLORIDE 0.9% IV SOLUTION: Freq: Once | INTRAVENOUS | Status: AC

## 2021-05-02 MED ORDER — PROPOFOL 500 MG/50ML IV EMUL
INTRAVENOUS | Status: DC | PRN
  Administered 2021-05-02: 150 ug/kg/min via INTRAVENOUS

## 2021-05-02 MED ORDER — PANTOPRAZOLE SODIUM 40 MG PO TBEC
40.0000 mg | DELAYED_RELEASE_TABLET | Freq: Two times a day (BID) | ORAL | Status: DC
  Administered 2021-05-02 – 2021-05-03 (×3): 40 mg via ORAL
  Filled 2021-05-02 (×4): qty 1

## 2021-05-02 MED ORDER — PROPOFOL 10 MG/ML IV BOLUS
INTRAVENOUS | Status: DC | PRN
  Administered 2021-05-02: 80 mg via INTRAVENOUS

## 2021-05-02 MED ORDER — CYANOCOBALAMIN 1000 MCG/ML IJ SOLN
1000.0000 ug | Freq: Once | INTRAMUSCULAR | Status: AC
  Administered 2021-05-02: 18:00:00 1000 ug via INTRAMUSCULAR
  Filled 2021-05-02: qty 1

## 2021-05-02 MED ORDER — HYDROMORPHONE HCL 1 MG/ML IJ SOLN
0.5000 mg | INTRAMUSCULAR | Status: DC | PRN
  Administered 2021-05-02: 15:00:00 0.5 mg via INTRAVENOUS
  Filled 2021-05-02: qty 1

## 2021-05-02 NOTE — Transfer of Care (Signed)
Immediate Anesthesia Transfer of Care Note  Patient: Katherine [name]  Procedure(s) Performed: Procedure(s): ESOPHAGOGASTRODUODENOSCOPY (EGD) (N/A)  Patient Location: PACU and Endoscopy Unit  Anesthesia Type:General  Level of Consciousness: sedated  Airway & Oxygen Therapy: Patient Spontanous Breathing and Patient connected to nasal cannula oxygen  Post-op Assessment: Report given to RN and Post -op Vital signs reviewed and stable  Post vital signs: Reviewed and stable  Last Vitals:  Vitals:   05/02/21 1044 05/02/21 1155  BP: 120/60 (!) 96/35  Pulse: 64 64  Resp: 16 15  Temp: (!) 35.7 C (!) 35.7 C  SpO2: 339% 17%    Complications: No apparent anesthesia complications

## 2021-05-02 NOTE — Progress Notes (Signed)
PROGRESS NOTE    Katherine [name]  RWE:[id number] DOB: 05/20/1945 DOA: 04/30/2021 PCP: [name], MD    Brief Narrative:  75 year old with history of paroxysmal A. fib on Xarelto, iron-deficiency anemia, type 2 diabetes and hypothyroidism presented with sudden onset of worsening epigastric pain radiating to the back and extreme weakness.  Apparently suffering from back pain and taking iron supplements along with Naprosyn twice daily for the last 3 weeks.  Hemoglobin 7.1.  Hemodynamically stable.   Assessment & Plan:   Principal Problem:   GI bleed  Acute upper GI bleed possibly secondary to NSAID use along with coagulopathy from Xarelto. Anemia of acute blood loss. Currently hemodynamically stabilized. Hemoglobin 7.1-2 units transfusion-7.5-6.9-2 additional PRBC today. Continue checking hemoglobin every 12 hours.   Protonix IV infusion ongoing. Followed by gastroenterology.  Is scheduled for upper GI endoscopy today.  Paroxysmal A. fib: Rate controlled.  Anticoagulation on hold.  Continue metoprolol.  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  No acute neurological deficits. Avoid NSAIDs. Lidocaine patch, low-dose opiates, muscle relaxants and Valium with some improvement.  Will need outpatient follow-up.  Type 2 diabetes: Well-controlled.  On metformin, Amaryl and Januvia at home.  Sliding scale insulin in the hospital.  DVT prophylaxis: SCDs Start: 04/30/21 1623   Code Status: Full code/partial code Family Communication: None Disposition Plan: Status is: Inpatient  Remains inpatient appropriate because: Significant blood loss anemia.  Requiring inpatient procedures.         Consultants:  Gastroenterology  Procedures:  None  Antimicrobials:  None   Subjective:  Patient seen and examined.  No overnight events.  Hemoglobin 6.9.  Ordered 2 units of PRBC today. Still has significant back pain but some  relief with Valium tablet.  Patient was not sure whether his abdominal pain referred to back or back pain referred to the front.  She is looking forward for procedure.  No bowel movement in the hospital.  Objective: Vitals:   05/02/21 0721 05/02/21 0830 05/02/21 0915 05/02/21 1044  BP: (!) 105/43 (!) 117/51 (!) 121/52 120/60  Pulse: 71 74 69 64  Resp: 18 18 18 16   Temp: 97.8 F (36.6 C) 100 F (37.8 C) 97.7 F (36.5 C) (!) 96.3 F (35.7 C)  TempSrc:   Oral Temporal  SpO2: 98% 99% 100% 100%  Weight:    112 kg  Height:    5\' 7"  (1.702 m)    Intake/Output Summary (Last 24 hours) at 05/02/2021 1055 Last data filed at 05/01/2021 1902 Gross per 24 hour  Intake 120 ml  Output --  Net 120 ml   Filed Weights   04/30/21 1337 05/02/21 1044  Weight: 109.6 kg 112 kg    Examination:  General exam: Appears calm and comfortable, not in any distress. Respiratory system: Clear to auscultation. Respiratory effort normal. Cardiovascular system: S1 & S2 heard, RRR.  No edema.   Gastrointestinal system: Abdomen is nondistended, soft and nontender. No organomegaly or masses felt. Normal bowel sounds heard. Central nervous system: Alert and oriented. No focal neurological deficits. Extremities: Symmetric 5 x 5 power. Skin: Ecchymosis, extravasation left arm. Psychiatry: Judgement and insight appear normal. Mood & affect appropriate.     Data Reviewed: I have personally reviewed following labs and imaging studies  CBC: Recent Labs  Lab 04/30/21 1344 05/01/21 0313 05/01/21 1645 05/02/21 0456  WBC 5.6 4.9  --   --   NEUTROABS 4.0  --   --   --  HGB 7.1* 7.5* 7.2* 6.9*  HCT 22.2* 22.6* 21.7* 21.2*  MCV 81.3 80.4  --   --   PLT 272 213  --   --    Basic Metabolic Panel: Recent Labs  Lab 04/30/21 1344 05/01/21 0313 05/02/21 0456  NA 136 139 139  K 4.1 3.9 4.0  CL 105 109 109  CO2 24 27 26   GLUCOSE 247* 144* 135*  BUN 75* 65* 46*  CREATININE 1.48* 1.09* 0.90  CALCIUM 9.6  9.4 8.9  MG  --   --  1.9  PHOS  --   --  3.4   GFR: Estimated Creatinine Clearance: 71.9 mL/min (by C-G formula based on SCr of 0.9 mg/dL). Liver Function Tests: Recent Labs  Lab 04/30/21 1344  AST 15  ALT 10  ALKPHOS 59  BILITOT 0.7  PROT 5.9*  ALBUMIN 3.1*   Recent Labs  Lab 04/30/21 1344  LIPASE 27   No results for input(s): AMMONIA in the last 168 hours. Coagulation Profile: No results for input(s): INR, PROTIME in the last 168 hours. Cardiac Enzymes: No results for input(s): CKTOTAL, CKMB, CKMBINDEX, TROPONINI in the last 168 hours. BNP (last 3 results) No results for input(s): PROBNP in the last 8760 hours. HbA1C: Recent Labs    04/30/21 1344  HGBA1C 6.4*   CBG: Recent Labs  Lab 05/01/21 1156 05/01/21 1654 05/01/21 2132 05/02/21 0717 05/02/21 0737  GLUCAP 150* 107* 133* 155* 134*   Lipid Profile: No results for input(s): CHOL, HDL, LDLCALC, TRIG, CHOLHDL, LDLDIRECT in the last 72 hours. Thyroid Function Tests: No results for input(s): TSH, T4TOTAL, FREET4, T3FREE, THYROIDAB in the last 72 hours. Anemia Panel: Recent Labs    04/30/21 1344  VITAMINB12 168*  FOLATE 14.6  FERRITIN 5*  TIBC 360  IRON 30   Sepsis Labs: Recent Labs  Lab 04/30/21 1341 04/30/21 1620  LATICACIDVEN 2.0* 1.4    Recent Results (from the past 240 hour(s))  Resp Panel by RT-PCR (Flu A&B, Covid) Nasopharyngeal Swab     Status: None   Collection Time: 04/30/21  4:20 PM   Specimen: Nasopharyngeal Swab; Nasopharyngeal(NP) swabs in vial transport medium  Result Value Ref Range Status   SARS Coronavirus 2 by RT PCR NEGATIVE NEGATIVE Final    Comment: (NOTE) SARS-CoV-2 target nucleic acids are NOT DETECTED.  The SARS-CoV-2 RNA is generally detectable in upper respiratory specimens during the acute phase of infection. The lowest concentration of SARS-CoV-2 viral copies this assay can detect is 138 copies/mL. A negative result does not preclude SARS-Cov-2 infection and  should not be used as the sole basis for treatment or other patient management decisions. A negative result may occur with  improper specimen collection/handling, submission of specimen other than nasopharyngeal swab, presence of viral mutation(s) within the areas targeted by this assay, and inadequate number of viral copies(<138 copies/mL). A negative result must be combined with clinical observations, patient history, and epidemiological information. The expected result is Negative.  Fact Sheet for Patients:  EntrepreneurPulse.com.au  Fact Sheet for Healthcare Providers:  IncredibleEmployment.be  This test is no t yet approved or cleared by the [place] FDA and  has been authorized for detection and/or diagnosis of SARS-CoV-2 by FDA under an Emergency Use Authorization (EUA). This EUA will remain  in effect (meaning this test can be used) for the duration of the COVID-19 declaration under Section 564(b)(1) of the Act, 21 U.S.C.section 360bbb-3(b)(1), unless the authorization is terminated  or revoked sooner.  Influenza A by PCR NEGATIVE NEGATIVE Final   Influenza B by PCR NEGATIVE NEGATIVE Final    Comment: (NOTE) The Xpert Xpress SARS-CoV-2/FLU/RSV plus assay is intended as an aid in the diagnosis of influenza from Nasopharyngeal swab specimens and should not be used as a sole basis for treatment. Nasal washings and aspirates are unacceptable for Xpert Xpress SARS-CoV-2/FLU/RSV testing.  Fact Sheet for Patients: EntrepreneurPulse.com.au  Fact Sheet for Healthcare Providers: IncredibleEmployment.be  This test is not yet approved or cleared by the [place] FDA and has been authorized for detection and/or diagnosis of SARS-CoV-2 by FDA under an Emergency Use Authorization (EUA). This EUA will remain in effect (meaning this test can be used) for the duration of the COVID-19 declaration  under Section 564(b)(1) of the Act, 21 U.S.C. section 360bbb-3(b)(1), unless the authorization is terminated or revoked.  Performed at [facility], [address], [place], [place] [address]          Radiology Studies: MR THORACIC SPINE WO CONTRAST  Result Date: 04/30/2021 CLINICAL DATA:  Initial evaluation for acute mid back pain, compression fracture. EXAM: MRI THORACIC AND LUMBAR SPINE WITHOUT CONTRAST TECHNIQUE: Multiplanar and multiecho pulse sequences of the thoracic and lumbar spine were obtained without intravenous contrast. COMPARISON:  Prior CT from earlier the same day. FINDINGS: MRI THORACIC SPINE FINDINGS Alignment: Physiologic with preservation of the normal thoracic kyphosis. No listhesis. Vertebrae: Acute compression fracture involving the superior endplate of H06 with mild 20% height loss and trace 2 mm bony retropulsion. No significant spinal stenosis. Otherwise, vertebral body height maintained with no other acute or chronic fracture. Bone marrow signal intensity within normal limits. No worrisome osseous lesions. Discogenic reactive endplate change present about the T6-7 interspace. No other abnormal marrow edema. Cord: Normal signal morphology. Incidental note made of a 1.2 cm well-circumscribed cystic lesion at the posterior epidural space at the level of T4-5 (series 21, image 7), nonspecific, likely a small arachnoid cyst. Finding felt to be of doubtful significance. Paraspinal and other soft tissues: Paraspinous soft tissues demonstrate no acute finding. Disc levels: T6-7: Degenerative disc bulge with reactive endplate change. No significant spinal stenosis. Foramina remain patent. T7-8: Small central disc protrusion indents the ventral thecal sac (series 23, image 23). Minimal flattening of the ventral cord without significant spinal stenosis or cord signal changes. Foramina remain patent. Otherwise, no other significant disc pathology seen within the thoracic  spine for age. No other stenosis or neural impingement. MRI LUMBAR SPINE FINDINGS Segmentation: Standard. Lowest well-formed disc space labeled the L5-S1 level. Alignment: Physiologic with preservation of the normal lumbar lordosis. No listhesis. Vertebrae: Chronic compression fracture involving the superior endplate of L2 with up to 50% height loss and trace 2 mm bony retropulsion. Otherwise, vertebral body height maintained with no other acute or chronic fracture. Bone marrow signal intensity within normal limits. No worrisome osseous lesions. No abnormal marrow edema. Conus medullaris and cauda equina: Conus extends to the T12. Level. Conus and cauda equina appear normal. Paraspinal and other soft tissues: Unremarkable. Disc levels: L1-2: Mild disc bulge with disc desiccation. Trace bony retropulsion related to the chronic T12 compression fracture. Mild facet hypertrophy. No spinal stenosis. Foramina remain patent. L2-3: Minimal annular disc bulge with mild facet hypertrophy. No canal or foraminal stenosis. L3-4: Disc desiccation with mild annular disc bulge. Superimposed small right foraminal to extraforaminal disc protrusion closely approximates the exiting right L3 nerve root (series 19, image 22). Mild facet hypertrophy with small joint effusions. No spinal stenosis.  Foramina remain patent. L4-5: Disc desiccation with minimal annular disc bulge. Mild facet hypertrophy with trace joint effusions. No spinal stenosis. Foramina remain patent. L5-S1: Negative interspace. No significant canal or lateral recess stenosis. Foramina remain patent. IMPRESSION: MR THORACIC SPINE IMPRESSION: 1. Acute compression fracture involving the superior endplate of U44 with mild 20% height loss and trace 2 mm bony retropulsion. No significant spinal stenosis. 2. No other acute abnormality within the thoracic spine. 3. Small central disc protrusion at T7-8 with minimal cord flattening, but no cord signal changes or significant  stenosis. MR LUMBAR SPINE IMPRESSION: 1. No acute abnormality within the lumbar spine. 2. Chronic compression fracture involving the superior endplate of L2 with up to 50% height loss and trace 2 mm bony retropulsion. No significant stenosis. 3. Small right foraminal to extraforaminal disc protrusion at L3-4, closely approximating and potentially irritating the exiting right L3 nerve root. Electronically Signed   By: Jeannine Boga M.D.   On: 04/30/2021 23:42   MR LUMBAR SPINE WO CONTRAST  Result Date: 04/30/2021 CLINICAL DATA:  Initial evaluation for acute mid back pain, compression fracture. EXAM: MRI THORACIC AND LUMBAR SPINE WITHOUT CONTRAST TECHNIQUE: Multiplanar and multiecho pulse sequences of the thoracic and lumbar spine were obtained without intravenous contrast. COMPARISON:  Prior CT from earlier the same day. FINDINGS: MRI THORACIC SPINE FINDINGS Alignment: Physiologic with preservation of the normal thoracic kyphosis. No listhesis. Vertebrae: Acute compression fracture involving the superior endplate of I34 with mild 20% height loss and trace 2 mm bony retropulsion. No significant spinal stenosis. Otherwise, vertebral body height maintained with no other acute or chronic fracture. Bone marrow signal intensity within normal limits. No worrisome osseous lesions. Discogenic reactive endplate change present about the T6-7 interspace. No other abnormal marrow edema. Cord: Normal signal morphology. Incidental note made of a 1.2 cm well-circumscribed cystic lesion at the posterior epidural space at the level of T4-5 (series 21, image 7), nonspecific, likely a small arachnoid cyst. Finding felt to be of doubtful significance. Paraspinal and other soft tissues: Paraspinous soft tissues demonstrate no acute finding. Disc levels: T6-7: Degenerative disc bulge with reactive endplate change. No significant spinal stenosis. Foramina remain patent. T7-8: Small central disc protrusion indents the ventral  thecal sac (series 23, image 23). Minimal flattening of the ventral cord without significant spinal stenosis or cord signal changes. Foramina remain patent. Otherwise, no other significant disc pathology seen within the thoracic spine for age. No other stenosis or neural impingement. MRI LUMBAR SPINE FINDINGS Segmentation: Standard. Lowest well-formed disc space labeled the L5-S1 level. Alignment: Physiologic with preservation of the normal lumbar lordosis. No listhesis. Vertebrae: Chronic compression fracture involving the superior endplate of L2 with up to 50% height loss and trace 2 mm bony retropulsion. Otherwise, vertebral body height maintained with no other acute or chronic fracture. Bone marrow signal intensity within normal limits. No worrisome osseous lesions. No abnormal marrow edema. Conus medullaris and cauda equina: Conus extends to the T12. Level. Conus and cauda equina appear normal. Paraspinal and other soft tissues: Unremarkable. Disc levels: L1-2: Mild disc bulge with disc desiccation. Trace bony retropulsion related to the chronic T12 compression fracture. Mild facet hypertrophy. No spinal stenosis. Foramina remain patent. L2-3: Minimal annular disc bulge with mild facet hypertrophy. No canal or foraminal stenosis. L3-4: Disc desiccation with mild annular disc bulge. Superimposed small right foraminal to extraforaminal disc protrusion closely approximates the exiting right L3 nerve root (series 19, image 22). Mild facet hypertrophy with small joint effusions. No spinal  stenosis. Foramina remain patent. L4-5: Disc desiccation with minimal annular disc bulge. Mild facet hypertrophy with trace joint effusions. No spinal stenosis. Foramina remain patent. L5-S1: Negative interspace. No significant canal or lateral recess stenosis. Foramina remain patent. IMPRESSION: MR THORACIC SPINE IMPRESSION: 1. Acute compression fracture involving the superior endplate of Z61 with mild 20% height loss and trace 2  mm bony retropulsion. No significant spinal stenosis. 2. No other acute abnormality within the thoracic spine. 3. Small central disc protrusion at T7-8 with minimal cord flattening, but no cord signal changes or significant stenosis. MR LUMBAR SPINE IMPRESSION: 1. No acute abnormality within the lumbar spine. 2. Chronic compression fracture involving the superior endplate of L2 with up to 50% height loss and trace 2 mm bony retropulsion. No significant stenosis. 3. Small right foraminal to extraforaminal disc protrusion at L3-4, closely approximating and potentially irritating the exiting right L3 nerve root. Electronically Signed   By: Jeannine Boga M.D.   On: 04/30/2021 23:42   CT Angio Chest/Abd/Pel for Dissection W and/or W/WO  Result Date: 04/30/2021 CLINICAL DATA:  Abdominal pain, severe epigastric and back pain, possible abdominal aortic aneurysm, possible perforated ulcer EXAM: CT ANGIOGRAPHY CHEST, ABDOMEN AND PELVIS TECHNIQUE: Non-contrast CT of the chest was initially obtained. Multidetector CT imaging through the chest, abdomen and pelvis was performed using the standard protocol during bolus administration of intravenous contrast. Multiplanar reconstructed images and MIPs were obtained and reviewed to evaluate the vascular anatomy. CONTRAST:  176mL OMNIPAQUE IOHEXOL 350 MG/ML SOLN COMPARISON:  03/13/2019 FINDINGS: CTA CHEST FINDINGS Cardiovascular: Preferential opacification of the thoracic aorta. Normal contour and caliber of the thoracic aorta without evidence of aneurysm, dissection, or other acute aortic pathology. Normal heart size. Left coronary artery calcifications. No pericardial effusion. Mild mixed calcific aortic atherosclerosis. Mediastinum/Nodes: No enlarged mediastinal, hilar, or axillary lymph nodes. Small hiatal hernia. Thyroid gland, trachea, and esophagus demonstrate no significant findings. Lungs/Pleura: Lungs are clear. No pleural effusion or pneumothorax.  Musculoskeletal: No chest wall abnormality. Age indeterminate wedge deformity of T11. Nonacute fracture of the anterior left fifth rib (series 5, image 72). Review of the MIP images confirms the above findings. CTA ABDOMEN AND PELVIS FINDINGS VASCULAR Normal contour and caliber of the abdominal aorta. No evidence of aneurysm, dissection, or other acute aortic pathology. 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 The branch vessel ostia are patent. Moderate mixed calcific atherosclerosis. Review of the MIP images confirms the above findings. NON-VASCULAR Hepatobiliary: No focal liver abnormality is seen. Status post cholecystectomy. No biliary dilatation. Pancreas: Unremarkable. No pancreatic ductal dilatation or surrounding inflammatory changes. Spleen: Normal in size without significant abnormality. Adrenals/Urinary Tract: Adrenal glands are unremarkable. Kidneys are normal, without renal calculi, solid lesion, or hydronephrosis. Bladder is unremarkable. Stomach/Bowel: Stomach is within normal limits. Appendix appears normal. No evidence of bowel wall thickening, distention, or inflammatory changes. Lymphatic: No enlarged abdominal or pelvic lymph nodes. Reproductive: No mass or other significant abnormality. Other: No abdominal wall hernia or abnormality. No abdominopelvic ascites. Musculoskeletal: Age indeterminate wedge deformity of L2. Review of the MIP images confirms the above findings. IMPRESSION: 1. Normal contour and caliber of the thoracic and abdominal aorta without evidence of aneurysm, dissection, or other acute aortic pathology. Mild-to-moderate mixed calcific atherosclerosis. 2. No acute CT findings of the abdomen or pelvis to explain pain on this early arterial phase examination. 3. Age indeterminate wedge deformities of T11 and L2. Correlate for acute point tenderness.  4. Small hiatal hernia. 5. Coronary artery  disease. Aortic Atherosclerosis (ICD10-I70.0). Electronically Signed   By: Delanna Ahmadi M.D.   On: 04/30/2021 14:29        Scheduled Meds:  [MAR Hold] cholecalciferol  1,000 Units Oral Daily   [MAR Hold] insulin aspart  0-15 Units Subcutaneous TID WC   [MAR Hold] insulin aspart  0-5 Units Subcutaneous QHS   [MAR Hold] levothyroxine  150 mcg Oral Daily   [MAR Hold] lidocaine  1 patch Transdermal Q24H   [MAR Hold] metoprolol succinate  25 mg Oral Daily   [MAR Hold] pantoprazole  40 mg Intravenous Q12H   Continuous Infusions:  sodium chloride Stopped (04/30/21 1658)   [MAR Hold] pantoprazole Stopped (04/30/21 1657)   pantoprazole Stopped (04/30/21 1658)     LOS: 2 days    Time spent: 30 minutes    Barb Merino, MD [facility] Pager [phone]

## 2021-05-02 NOTE — Anesthesia Postprocedure Evaluation (Signed)
Anesthesia Post Note  Patient: Katherine [name]  Procedure(s) Performed: ESOPHAGOGASTRODUODENOSCOPY (EGD)  Patient location during evaluation: PACU Anesthesia Type: General Level of consciousness: awake and awake and alert Pain management: pain level controlled Respiratory status: spontaneous breathing, nonlabored ventilation and respiratory function stable Cardiovascular status: stable Anesthetic complications: no   No notable events documented.   Last Vitals:  Vitals:   05/02/21 1205 05/02/21 1215  BP: (!) 101/42   Pulse: 62 64  Resp: 13 12  Temp:    SpO2: 100% 100%    Last Pain:  Vitals:   05/02/21 1205  TempSrc:   PainSc: 0-No pain                 VAN STAVEREN,Durrell Barajas

## 2021-05-02 NOTE — Progress Notes (Addendum)
Pt hemoglobin 6.9 this am. Sharion Settler made aware via secure chat, waiting for response. Will cont to monitor pt.

## 2021-05-02 NOTE — Anesthesia Preprocedure Evaluation (Signed)
Anesthesia Evaluation  Patient identified by MRN, date of birth, ID band Patient awake    Reviewed: Allergy & Precautions, NPO status , Patient's Chart, lab work & pertinent test results  Airway Mallampati: II  TM Distance: >3 FB Neck ROM: full    Dental  (+) Teeth Intact   Pulmonary neg pulmonary ROS, former smoker,    Pulmonary exam normal breath sounds clear to auscultation       Cardiovascular hypertension, Pt. on medications negative cardio ROS Normal cardiovascular exam Rhythm:Regular Rate:Normal     Neuro/Psych Depression negative neurological ROS  negative psych ROS   GI/Hepatic negative GI ROS, Neg liver ROS,   Endo/Other  negative endocrine ROSdiabetes, Well Controlled, Type 2Hypothyroidism   Renal/GU negative Renal ROS  negative genitourinary   Musculoskeletal negative musculoskeletal ROS (+)   Abdominal Normal abdominal exam  (+)   Peds negative pediatric ROS (+)  Hematology negative hematology ROS (+) Blood dyscrasia, anemia ,   Anesthesia Other Findings Past Medical History: No date: Anemia No date: Cancer ([place]) No date: Cystocele No date: Depression No date: Diabetes mellitus without complication (HCC) No date: Glaucoma No date: Hyperlipidemia No date: Hypertension No date: Hypothyroidism  Past Surgical History: 07/2012: BREAST EXCISIONAL BIOPSY; Left No date: CHOLECYSTECTOMY 08/17/2015: COLONOSCOPY WITH PROPOFOL; N/A     Comment:  Procedure: COLONOSCOPY WITH PROPOFOL;  Surgeon: Josefine Class, MD;  Location: [facility] ENDOSCOPY;  Service:               Endoscopy;  Laterality: N/A; 02/25/2019: ESOPHAGOGASTRODUODENOSCOPY; N/A     Comment:  Procedure: ESOPHAGOGASTRODUODENOSCOPY (EGD);  Surgeon:               Virgel Manifold, MD;  Location: [facility] ENDOSCOPY;                Service: Endoscopy;  Laterality: N/A; 04/13/2019: ESOPHAGOGASTRODUODENOSCOPY (EGD) WITH PROPOFOL; N/A      Comment:  Procedure: ESOPHAGOGASTRODUODENOSCOPY (EGD) WITH               PROPOFOL;  Surgeon: Virgel Manifold, MD;  Location:               [facility] ENDOSCOPY;  Service: Endoscopy;  Laterality: N/A;  BMI    Body Mass Index: 38.67 kg/m      Reproductive/Obstetrics negative OB ROS                             Anesthesia Physical Anesthesia Plan  ASA: 3  Anesthesia Plan: General   Post-op Pain Management:    Induction: Intravenous  PONV Risk Score and Plan: Propofol infusion and TIVA  Airway Management Planned: Natural Airway and Nasal Cannula  Additional Equipment:   Intra-op Plan:   Post-operative Plan:   Informed Consent: I have reviewed the patients History and Physical, chart, labs and discussed the procedure including the risks, benefits and alternatives for the proposed anesthesia with the patient or authorized representative who has indicated his/her understanding and acceptance.     Dental Advisory Given  Plan Discussed with: CRNA and Surgeon  Anesthesia Plan Comments:         Anesthesia Quick Evaluation

## 2021-05-02 NOTE — Anesthesia Procedure Notes (Signed)
Date/Time: 05/02/2021 11:42 AM Performed by: Doreen Salvage, CRNA Pre-anesthesia Checklist: Patient identified, Emergency Drugs available, Suction available and Patient being monitored Patient Re-evaluated:Patient Re-evaluated prior to induction Oxygen Delivery Method: Nasal cannula Induction Type: IV induction Dental Injury: Teeth and Oropharynx as per pre-operative assessment  Comments: Nasal cannula with etCO2 monitoring

## 2021-05-02 NOTE — Op Note (Signed)
[facility] Patient Name: Katherine [name] Procedure Date: 05/02/2021 11:33 AM MRN: [id number] Account #: [id number] Date of Birth: 1946/03/17 Admit Type: Inpatient Age: 75 Room: [facility] ENDO ROOM 4 Gender: Female Note Status: Finalized Instrument Name: Upper Endoscope [id number] Procedure:             Upper GI endoscopy Indications:           Acute post hemorrhagic anemia Providers:             Lucilla Lame MD, MD Referring MD:          Youlanda Roys. Lovie Macadamia, MD (Referring MD) Medicines:             Propofol per Anesthesia Complications:         No immediate complications. Procedure:             Pre-Anesthesia Assessment:                        - Prior to the procedure, a History and Physical was                         performed, and patient medications and allergies were                         reviewed. The patient's tolerance of previous                         anesthesia was also reviewed. The risks and benefits                         of the procedure and the sedation options and risks                         were discussed with the patient. All questions were                         answered, and informed consent was obtained. Prior                         Anticoagulants: The patient has taken no previous                         anticoagulant or antiplatelet agents. ASA Grade                         Assessment: II - A patient with mild systemic disease.                         After reviewing the risks and benefits, the patient                         was deemed in satisfactory condition to undergo the                         procedure.                        After obtaining informed consent, the endoscope was  passed under direct vision. Throughout the procedure,                         the patient's blood pressure, pulse, and oxygen                         saturations were monitored continuously. The Endoscope                          was introduced through the mouth, and advanced to the                         second part of duodenum. The upper GI endoscopy was                         accomplished without difficulty. The patient tolerated                         the procedure well. Findings:      The examined esophagus was normal.      One non-bleeding cratered gastric ulcer with no stigmata of bleeding was       found in the gastric antrum.      One non-bleeding superficial gastric ulcer with no stigmata of bleeding       was found in the gastric antrum.      The examined duodenum was normal. Impression:            - Normal esophagus.                        - Non-bleeding gastric ulcer with no stigmata of                         bleeding.                        - Non-bleeding gastric ulcer with no stigmata of                         bleeding.                        - Normal examined duodenum.                        - No specimens collected. Recommendation:        - Return patient to hospital ward for ongoing care.                        - Clear liquid diet.                        - Continue present medications. Procedure Code(s):     --- Professional ---                        [phone], Esophagogastroduodenoscopy, flexible,                         transoral; diagnostic, including collection of  specimen(s) by brushing or washing, when performed                         (separate procedure) Diagnosis Code(s):     --- Professional ---                        D62, Acute posthemorrhagic anemia                        K25.9, Gastric ulcer, unspecified as acute or chronic,                         without hemorrhage or perforation CPT copyright 2019 [employer]. All rights reserved. The codes documented in this report are preliminary and upon coder review may  be revised to meet current compliance requirements. Lucilla Lame MD, MD 05/02/2021 11:51:02 AM This report has been  signed electronically. Number of Addenda: 0 Note Initiated On: 05/02/2021 11:33 AM Estimated Blood Loss:  Estimated blood loss: none.      [facility]

## 2021-05-02 NOTE — Plan of Care (Signed)

## 2021-05-03 ENCOUNTER — Encounter: Payer: Self-pay | Admitting: Gastroenterology

## 2021-05-03 DIAGNOSIS — K254 Chronic or unspecified gastric ulcer with hemorrhage: Secondary | ICD-10-CM

## 2021-05-03 LAB — TYPE AND SCREEN
ABO/RH(D): A POS
Antibody Screen: NEGATIVE
Unit division: 0
Unit division: 0
Unit division: 0
Unit division: 0

## 2021-05-03 LAB — BPAM RBC
Blood Product Expiration Date: 202212282359
Blood Product Expiration Date: 202301172359
Blood Product Expiration Date: 202301192359
Blood Product Expiration Date: 202301192359
ISSUE DATE / TIME: 202212201522
ISSUE DATE / TIME: 202212201758
ISSUE DATE / TIME: 202212220847
ISSUE DATE / TIME: 202212221442
Unit Type and Rh: 6200
Unit Type and Rh: 6200
Unit Type and Rh: 6200
Unit Type and Rh: 6200

## 2021-05-03 LAB — GLUCOSE, CAPILLARY
Glucose-Capillary: 132 mg/dL — ABNORMAL HIGH (ref 70–99)
Glucose-Capillary: 177 mg/dL — ABNORMAL HIGH (ref 70–99)

## 2021-05-03 LAB — HEMOGLOBIN AND HEMATOCRIT, BLOOD
HCT: 24.2 % — ABNORMAL LOW (ref 36.0–46.0)
Hemoglobin: 7.9 g/dL — ABNORMAL LOW (ref 12.0–15.0)

## 2021-05-03 MED ORDER — CYCLOBENZAPRINE HCL 5 MG PO TABS: 5.0000 mg | ORAL_TABLET | Freq: Three times a day (TID) | ORAL | 0 refills | Status: DC | PRN

## 2021-05-03 MED ORDER — PANTOPRAZOLE SODIUM 40 MG PO TBEC: 40.0000 mg | DELAYED_RELEASE_TABLET | Freq: Two times a day (BID) | ORAL | 2 refills | Status: DC

## 2021-05-03 MED ORDER — LIDOCAINE 5 % EX PTCH: 1.0000 | MEDICATED_PATCH | CUTANEOUS | 0 refills | Status: DC

## 2021-05-03 MED ORDER — CYANOCOBALAMIN 1000 MCG PO TABS: 1000.0000 ug | ORAL_TABLET | Freq: Every day | ORAL | 2 refills | Status: AC

## 2021-05-03 MED ORDER — SODIUM CHLORIDE 0.9 % IV SOLN
300.0000 mg | Freq: Once | INTRAVENOUS | Status: AC
  Administered 2021-05-03: 10:00:00 300 mg via INTRAVENOUS
  Filled 2021-05-03: qty 300

## 2021-05-03 MED ORDER — OXYCODONE HCL 5 MG PO TABS: 5.0000 mg | ORAL_TABLET | Freq: Four times a day (QID) | ORAL | 0 refills | Status: DC | PRN

## 2021-05-03 MED ORDER — VITAMIN B-12 1000 MCG PO TABS
1000.0000 ug | ORAL_TABLET | Freq: Every day | ORAL | Status: DC
  Administered 2021-05-03: 11:00:00 1000 ug via ORAL

## 2021-05-03 NOTE — Care Management Important Message (Signed)
Important Message  Patient Details  Name: Katherine [name] MRN: [id number] Date of Birth: 07-25-1945   Medicare Important Message Given:  Yes     Juliann Pulse A Johm Pfannenstiel 05/03/2021, 11:16 AM

## 2021-05-03 NOTE — Discharge Summary (Signed)
Physician Discharge Summary  Katherine [name] JKK:[id number] DOB: 1946/02/20 DOA: 04/30/2021  PCP: [name], MD  Admit date: 04/30/2021 Discharge date: 05/03/2021  Admitted From: Home Disposition: Home  Recommendations for Outpatient Follow-up:  Follow up with PCP in 1-2 weeks Check hemoglobin in 1 week.  Home Health: N/A Equipment/Devices: N/A  Discharge Condition: Stable CODE STATUS: Full code Diet recommendation: Low-salt diet  Discharge summary: 75 year old with history of paroxysmal A. fib on Xarelto, iron-deficiency anemia, type 2 diabetes and hypothyroidism presented with sudden onset of worsening epigastric pain radiating to the back and extreme weakness.  Apparently suffering from back pain and taking iron supplements along with Naprosyn twice daily for the last 3 weeks.  Hemoglobin 7.1.  Hemodynamically stable.  Patient admitted with upper GI bleeding and symptomatic anemia.  Acute upper GI bleeding secondary to NSAID use along with coagulopathy from Xarelto.  Anemia of acute blood loss with history of chronic iron-deficiency anemia. Remains symptomatic with hemoglobin 7.1-2 units of PRBC-dropped to 6.9-further 2 units of PRBC.  Hemoglobin is stabilized since then. Underwent upper GI endoscopy 12/22 and found to have nonbleeding gastric and duodenal ulcers.  They are healed. Given IV iron sucrose 300 mg x 2 while in the hospital. Adequately stabilized today. Discharging on Protonix 40 mg twice daily to continue until follow-up. She will not use any NSAIDs.  She will hold off Xarelto until follow-up hemoglobin checked.  May go back on Xarelto in 2 weeks if hemoglobin remains a stable.  Paroxysmal A. fib: Rate controlled.  Anticoagulation on hold.  Continue metoprolol.  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  Avoid NSAIDs.  MRI of the spine is done.  No acute neurological deficit. Lidocaine patch, low-dose opiates,  muscle relaxants and Valium with some improvement.  Will need outpatient follow-up.  Type 2 diabetes: Well-controlled.  On metformin, Amaryl and Januvia at home.  Resume on discharge.  Z16 deficiency: Folic acid normal.  Ferritin 5.  B12 164.  MCV 80.  Will prescribe B12 1000 mcg daily to continue.   Patient medically stabilized.  Discharged with advice for outpatient follow-up.  She may need referral to spine surgery if continues to have pain.  She was prescribed a short course of oxycodone because she could not use any NSAIDs.    Discharge Diagnoses:  Principal Problem:   GI bleed Active Problems:   Acute posthemorrhagic anemia   Acute gastric ulcer without hemorrhage or perforation    Discharge Instructions  Discharge Instructions     Call MD for:  extreme fatigue   Complete by: As directed    Call MD for:  persistant dizziness or light-headedness   Complete by: As directed    Call MD for:  persistant nausea and vomiting   Complete by: As directed    Call MD for:  severe uncontrolled pain   Complete by: As directed    Diet Carb Modified   Complete by: As directed    Discharge instructions   Complete by: As directed    Avoid NSAIDS , hold off on xarelto until follow up in 2 weeks  Recheck hemoglobin in one week Use heating pad/ patches and muscle relaxants for your back   Increase activity slowly   Complete by: As directed       Allergies as of 05/03/2021       Reactions   Lipitor [atorvastatin]    Severe weakness, joint pain   Pravastatin    Severe weakness,  joint pain   Codeine    Morphine And Related    Vytorin [ezetimibe-simvastatin]         Medication List     STOP taking these medications    famotidine 10 MG tablet Commonly known as: PEPCID   naproxen sodium 220 MG tablet Commonly known as: ALEVE   rivaroxaban 20 MG Tabs tablet Commonly known as: XARELTO   traMADol 50 MG tablet Commonly known as: ULTRAM       TAKE these medications     amLODipine 5 MG tablet Commonly known as: NORVASC Take 1 tablet (5 mg total) by mouth at bedtime.   cholecalciferol 1000 units tablet Commonly known as: VITAMIN D Take 1,000 Units by mouth daily.   cyanocobalamin 1000 MCG tablet Take 1 tablet (1,000 mcg total) by mouth daily. Start taking on: May 04, 2021   cyclobenzaprine 5 MG tablet Commonly known as: FLEXERIL Take 1 tablet (5 mg total) by mouth 3 (three) times daily as needed for muscle spasms.   diazepam 5 MG tablet Commonly known as: VALIUM Take 2.5-5 mg by mouth every 8 (eight) hours as needed for anxiety.   furosemide 40 MG tablet Commonly known as: LASIX Take 40 mg by mouth daily.   glimepiride 2 MG tablet Commonly known as: AMARYL Take 2 mg by mouth 2 (two) times daily.   lidocaine 5 % Commonly known as: LIDODERM Place 1 patch onto the skin daily. Remove & Discard patch within 12 hours or as directed by MD Start taking on: May 04, 2021   liothyronine 50 MCG tablet Commonly known as: CYTOMEL Take 50 mcg by mouth every morning.   lisinopril 20 MG tablet Commonly known as: ZESTRIL Take 20 mg by mouth daily.   metFORMIN 1000 MG tablet Commonly known as: GLUCOPHAGE Take 1,000 mg by mouth 2 (two) times daily with a meal.   metoprolol succinate 25 MG 24 hr tablet Commonly known as: TOPROL-XL Take 25 mg by mouth daily.   Multi-Vitamins Tabs Take 1 tablet by mouth daily.   oxyCODONE 5 MG immediate release tablet Commonly known as: Oxy IR/ROXICODONE Take 1 tablet (5 mg total) by mouth every 6 (six) hours as needed for moderate pain or breakthrough pain.   pantoprazole 40 MG tablet Commonly known as: PROTONIX Take 1 tablet (40 mg total) by mouth 2 (two) times daily. What changed:  when to take this Another medication with the same name was removed. Continue taking this medication, and follow the directions you see here.   pioglitazone 30 MG tablet Commonly known as: ACTOS Take 30 mg by  mouth daily.   sitaGLIPtin 100 MG tablet Commonly known as: JANUVIA Take 100 mg by mouth daily.   Synthroid 150 MCG tablet Generic drug: levothyroxine Take 150 mcg by mouth daily.        Follow-up Information     [name], MD. Go on 05/07/2021.   Specialty: Family Medicine Why: @ 8:45AM Contact information: [place] [address] [phone]                Allergies  Allergen Reactions   Lipitor [Atorvastatin]     Severe weakness, joint pain   Pravastatin     Severe weakness, joint pain   Codeine    Morphine And Related    Vytorin [Ezetimibe-Simvastatin]     Consultations: Gastroenterology   Procedures/Studies: MR THORACIC SPINE WO CONTRAST  Result Date: 04/30/2021 CLINICAL DATA:  Initial evaluation for acute mid back pain, compression  fracture. EXAM: MRI THORACIC AND LUMBAR SPINE WITHOUT CONTRAST TECHNIQUE: Multiplanar and multiecho pulse sequences of the thoracic and lumbar spine were obtained without intravenous contrast. COMPARISON:  Prior CT from earlier the same day. FINDINGS: MRI THORACIC SPINE FINDINGS Alignment: Physiologic with preservation of the normal thoracic kyphosis. No listhesis. Vertebrae: Acute compression fracture involving the superior endplate of W09 with mild 20% height loss and trace 2 mm bony retropulsion. No significant spinal stenosis. Otherwise, vertebral body height maintained with no other acute or chronic fracture. Bone marrow signal intensity within normal limits. No worrisome osseous lesions. Discogenic reactive endplate change present about the T6-7 interspace. No other abnormal marrow edema. Cord: Normal signal morphology. Incidental note made of a 1.2 cm well-circumscribed cystic lesion at the posterior epidural space at the level of T4-5 (series 21, image 7), nonspecific, likely a small arachnoid cyst. Finding felt to be of doubtful significance. Paraspinal and other soft tissues: Paraspinous soft tissues  demonstrate no acute finding. Disc levels: T6-7: Degenerative disc bulge with reactive endplate change. No significant spinal stenosis. Foramina remain patent. T7-8: Small central disc protrusion indents the ventral thecal sac (series 23, image 23). Minimal flattening of the ventral cord without significant spinal stenosis or cord signal changes. Foramina remain patent. Otherwise, no other significant disc pathology seen within the thoracic spine for age. No other stenosis or neural impingement. MRI LUMBAR SPINE FINDINGS Segmentation: Standard. Lowest well-formed disc space labeled the L5-S1 level. Alignment: Physiologic with preservation of the normal lumbar lordosis. No listhesis. Vertebrae: Chronic compression fracture involving the superior endplate of L2 with up to 50% height loss and trace 2 mm bony retropulsion. Otherwise, vertebral body height maintained with no other acute or chronic fracture. Bone marrow signal intensity within normal limits. No worrisome osseous lesions. No abnormal marrow edema. Conus medullaris and cauda equina: Conus extends to the T12. Level. Conus and cauda equina appear normal. Paraspinal and other soft tissues: Unremarkable. Disc levels: L1-2: Mild disc bulge with disc desiccation. Trace bony retropulsion related to the chronic T12 compression fracture. Mild facet hypertrophy. No spinal stenosis. Foramina remain patent. L2-3: Minimal annular disc bulge with mild facet hypertrophy. No canal or foraminal stenosis. L3-4: Disc desiccation with mild annular disc bulge. Superimposed small right foraminal to extraforaminal disc protrusion closely approximates the exiting right L3 nerve root (series 19, image 22). Mild facet hypertrophy with small joint effusions. No spinal stenosis. Foramina remain patent. L4-5: Disc desiccation with minimal annular disc bulge. Mild facet hypertrophy with trace joint effusions. No spinal stenosis. Foramina remain patent. L5-S1: Negative interspace. No  significant canal or lateral recess stenosis. Foramina remain patent. IMPRESSION: MR THORACIC SPINE IMPRESSION: 1. Acute compression fracture involving the superior endplate of W11 with mild 20% height loss and trace 2 mm bony retropulsion. No significant spinal stenosis. 2. No other acute abnormality within the thoracic spine. 3. Small central disc protrusion at T7-8 with minimal cord flattening, but no cord signal changes or significant stenosis. MR LUMBAR SPINE IMPRESSION: 1. No acute abnormality within the lumbar spine. 2. Chronic compression fracture involving the superior endplate of L2 with up to 50% height loss and trace 2 mm bony retropulsion. No significant stenosis. 3. Small right foraminal to extraforaminal disc protrusion at L3-4, closely approximating and potentially irritating the exiting right L3 nerve root. Electronically Signed   By: Jeannine Boga M.D.   On: 04/30/2021 23:42   MR LUMBAR SPINE WO CONTRAST  Result Date: 04/30/2021 CLINICAL DATA:  Initial evaluation for acute mid back pain,  compression fracture. EXAM: MRI THORACIC AND LUMBAR SPINE WITHOUT CONTRAST TECHNIQUE: Multiplanar and multiecho pulse sequences of the thoracic and lumbar spine were obtained without intravenous contrast. COMPARISON:  Prior CT from earlier the same day. FINDINGS: MRI THORACIC SPINE FINDINGS Alignment: Physiologic with preservation of the normal thoracic kyphosis. No listhesis. Vertebrae: Acute compression fracture involving the superior endplate of Y56 with mild 20% height loss and trace 2 mm bony retropulsion. No significant spinal stenosis. Otherwise, vertebral body height maintained with no other acute or chronic fracture. Bone marrow signal intensity within normal limits. No worrisome osseous lesions. Discogenic reactive endplate change present about the T6-7 interspace. No other abnormal marrow edema. Cord: Normal signal morphology. Incidental note made of a 1.2 cm well-circumscribed cystic lesion  at the posterior epidural space at the level of T4-5 (series 21, image 7), nonspecific, likely a small arachnoid cyst. Finding felt to be of doubtful significance. Paraspinal and other soft tissues: Paraspinous soft tissues demonstrate no acute finding. Disc levels: T6-7: Degenerative disc bulge with reactive endplate change. No significant spinal stenosis. Foramina remain patent. T7-8: Small central disc protrusion indents the ventral thecal sac (series 23, image 23). Minimal flattening of the ventral cord without significant spinal stenosis or cord signal changes. Foramina remain patent. Otherwise, no other significant disc pathology seen within the thoracic spine for age. No other stenosis or neural impingement. MRI LUMBAR SPINE FINDINGS Segmentation: Standard. Lowest well-formed disc space labeled the L5-S1 level. Alignment: Physiologic with preservation of the normal lumbar lordosis. No listhesis. Vertebrae: Chronic compression fracture involving the superior endplate of L2 with up to 50% height loss and trace 2 mm bony retropulsion. Otherwise, vertebral body height maintained with no other acute or chronic fracture. Bone marrow signal intensity within normal limits. No worrisome osseous lesions. No abnormal marrow edema. Conus medullaris and cauda equina: Conus extends to the T12. Level. Conus and cauda equina appear normal. Paraspinal and other soft tissues: Unremarkable. Disc levels: L1-2: Mild disc bulge with disc desiccation. Trace bony retropulsion related to the chronic T12 compression fracture. Mild facet hypertrophy. No spinal stenosis. Foramina remain patent. L2-3: Minimal annular disc bulge with mild facet hypertrophy. No canal or foraminal stenosis. L3-4: Disc desiccation with mild annular disc bulge. Superimposed small right foraminal to extraforaminal disc protrusion closely approximates the exiting right L3 nerve root (series 19, image 22). Mild facet hypertrophy with small joint effusions. No  spinal stenosis. Foramina remain patent. L4-5: Disc desiccation with minimal annular disc bulge. Mild facet hypertrophy with trace joint effusions. No spinal stenosis. Foramina remain patent. L5-S1: Negative interspace. No significant canal or lateral recess stenosis. Foramina remain patent. IMPRESSION: MR THORACIC SPINE IMPRESSION: 1. Acute compression fracture involving the superior endplate of L89 with mild 20% height loss and trace 2 mm bony retropulsion. No significant spinal stenosis. 2. No other acute abnormality within the thoracic spine. 3. Small central disc protrusion at T7-8 with minimal cord flattening, but no cord signal changes or significant stenosis. MR LUMBAR SPINE IMPRESSION: 1. No acute abnormality within the lumbar spine. 2. Chronic compression fracture involving the superior endplate of L2 with up to 50% height loss and trace 2 mm bony retropulsion. No significant stenosis. 3. Small right foraminal to extraforaminal disc protrusion at L3-4, closely approximating and potentially irritating the exiting right L3 nerve root. Electronically Signed   By: Jeannine Boga M.D.   On: 04/30/2021 23:42   CT Angio Chest/Abd/Pel for Dissection W and/or W/WO  Result Date: 04/30/2021 CLINICAL DATA:  Abdominal pain, severe  epigastric and back pain, possible abdominal aortic aneurysm, possible perforated ulcer EXAM: CT ANGIOGRAPHY CHEST, ABDOMEN AND PELVIS TECHNIQUE: Non-contrast CT of the chest was initially obtained. Multidetector CT imaging through the chest, abdomen and pelvis was performed using the standard protocol during bolus administration of intravenous contrast. Multiplanar reconstructed images and MIPs were obtained and reviewed to evaluate the vascular anatomy. CONTRAST:  128mL OMNIPAQUE IOHEXOL 350 MG/ML SOLN COMPARISON:  03/13/2019 FINDINGS: CTA CHEST FINDINGS Cardiovascular: Preferential opacification of the thoracic aorta. Normal contour and caliber of the thoracic aorta without  evidence of aneurysm, dissection, or other acute aortic pathology. Normal heart size. Left coronary artery calcifications. No pericardial effusion. Mild mixed calcific aortic atherosclerosis. Mediastinum/Nodes: No enlarged mediastinal, hilar, or axillary lymph nodes. Small hiatal hernia. Thyroid gland, trachea, and esophagus demonstrate no significant findings. Lungs/Pleura: Lungs are clear. No pleural effusion or pneumothorax. Musculoskeletal: No chest wall abnormality. Age indeterminate wedge deformity of T11. Nonacute fracture of the anterior left fifth rib (series 5, image 72). Review of the MIP images confirms the above findings. CTA ABDOMEN AND PELVIS FINDINGS VASCULAR Normal contour and caliber of the abdominal aorta. No evidence of aneurysm, dissection, or other acute aortic pathology. 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 The branch vessel ostia are patent. Moderate mixed calcific atherosclerosis. Review of the MIP images confirms the above findings. NON-VASCULAR Hepatobiliary: No focal liver abnormality is seen. Status post cholecystectomy. No biliary dilatation. Pancreas: Unremarkable. No pancreatic ductal dilatation or surrounding inflammatory changes. Spleen: Normal in size without significant abnormality. Adrenals/Urinary Tract: Adrenal glands are unremarkable. Kidneys are normal, without renal calculi, solid lesion, or hydronephrosis. Bladder is unremarkable. Stomach/Bowel: Stomach is within normal limits. Appendix appears normal. No evidence of bowel wall thickening, distention, or inflammatory changes. Lymphatic: No enlarged abdominal or pelvic lymph nodes. Reproductive: No mass or other significant abnormality. Other: No abdominal wall hernia or abnormality. No abdominopelvic ascites. Musculoskeletal: Age indeterminate wedge deformity of L2. Review of the MIP images confirms the above findings. IMPRESSION:  1. Normal contour and caliber of the thoracic and abdominal aorta without evidence of aneurysm, dissection, or other acute aortic pathology. Mild-to-moderate mixed calcific atherosclerosis. 2. No acute CT findings of the abdomen or pelvis to explain pain on this early arterial phase examination. 3. Age indeterminate wedge deformities of T11 and L2. Correlate for acute point tenderness. 4. Small hiatal hernia. 5. Coronary artery disease. Aortic Atherosclerosis (ICD10-I70.0). Electronically Signed   By: Delanna Ahmadi M.D.   On: 04/30/2021 14:29   (Echo, Carotid, EGD, Colonoscopy, ERCP)    Subjective: Patient seen and examined.  Husband at the bedside.  Still has low back pain but better than before.  She was able to get up and go to the bathroom.  Denies any dizziness or lightheadedness.  No BM since admission.   Discharge Exam: Vitals:   05/03/21 0721 05/03/21 1107  BP: (!) 124/42 (!) 129/47  Pulse: (!) 59 60  Resp: 18 18  Temp: 98.4 F (36.9 C) 98 F (36.7 C)  SpO2: 98% 98%   Vitals:   05/02/21 2017 05/03/21 0348 05/03/21 0721 05/03/21 1107  BP: (!) 127/50 (!) 114/50 (!) 124/42 (!) 129/47  Pulse: 61 (!) 58 (!) 59 60  Resp: 17 17 18 18   Temp: 98.1 F (36.7 C) 97.9 F (36.6 C) 98.4 F (36.9 C) 98 F (36.7 C)  TempSrc:      SpO2: 98% 98% 98% 98%  Weight:  Height:        General: Pt is alert, awake, not in acute distress Cardiovascular: RRR, S1/S2 +, no rubs, no gallops Respiratory: CTA bilaterally, no wheezing, no rhonchi Abdominal: Soft, NT, ND, bowel sounds + Extremities: no edema, no cyanosis, she does have some ecchymotic area along the IV line.    The results of significant diagnostics from this hospitalization (including imaging, microbiology, ancillary and laboratory) are listed below for reference.     Microbiology: Recent Results (from the past 240 hour(s))  Resp Panel by RT-PCR (Flu A&B, Covid) Nasopharyngeal Swab     Status: None   Collection Time: 04/30/21   4:20 PM   Specimen: Nasopharyngeal Swab; Nasopharyngeal(NP) swabs in vial transport medium  Result Value Ref Range Status   SARS Coronavirus 2 by RT PCR NEGATIVE NEGATIVE Final    Comment: (NOTE) SARS-CoV-2 target nucleic acids are NOT DETECTED.  The SARS-CoV-2 RNA is generally detectable in upper respiratory specimens during the acute phase of infection. The lowest concentration of SARS-CoV-2 viral copies this assay can detect is 138 copies/mL. A negative result does not preclude SARS-Cov-2 infection and should not be used as the sole basis for treatment or other patient management decisions. A negative result may occur with  improper specimen collection/handling, submission of specimen other than nasopharyngeal swab, presence of viral mutation(s) within the areas targeted by this assay, and inadequate number of viral copies(<138 copies/mL). A negative result must be combined with clinical observations, patient history, and epidemiological information. The expected result is Negative.  Fact Sheet for Patients:  EntrepreneurPulse.com.au  Fact Sheet for Healthcare Providers:  IncredibleEmployment.be  This test is no t yet approved or cleared by the [place] FDA and  has been authorized for detection and/or diagnosis of SARS-CoV-2 by FDA under an Emergency Use Authorization (EUA). This EUA will remain  in effect (meaning this test can be used) for the duration of the COVID-19 declaration under Section 564(b)(1) of the Act, 21 U.S.C.section 360bbb-3(b)(1), unless the authorization is terminated  or revoked sooner.       Influenza A by PCR NEGATIVE NEGATIVE Final   Influenza B by PCR NEGATIVE NEGATIVE Final    Comment: (NOTE) The Xpert Xpress SARS-CoV-2/FLU/RSV plus assay is intended as an aid in the diagnosis of influenza from Nasopharyngeal swab specimens and should not be used as a sole basis for treatment. Nasal washings and aspirates  are unacceptable for Xpert Xpress SARS-CoV-2/FLU/RSV testing.  Fact Sheet for Patients: EntrepreneurPulse.com.au  Fact Sheet for Healthcare Providers: IncredibleEmployment.be  This test is not yet approved or cleared by the [place] FDA and has been authorized for detection and/or diagnosis of SARS-CoV-2 by FDA under an Emergency Use Authorization (EUA). This EUA will remain in effect (meaning this test can be used) for the duration of the COVID-19 declaration under Section 564(b)(1) of the Act, 21 U.S.C. section 360bbb-3(b)(1), unless the authorization is terminated or revoked.  Performed at [facility], [address] [place], [place] [address]      Labs: BNP (last 3 results) No results for input(s): BNP in the last 8760 hours. Basic Metabolic Panel: Recent Labs  Lab 04/30/21 1344 05/01/21 0313 05/02/21 0456  NA 136 139 139  K 4.1 3.9 4.0  CL 105 109 109  CO2 24 27 26   GLUCOSE 247* 144* 135*  BUN 75* 65* 46*  CREATININE 1.48* 1.09* 0.90  CALCIUM 9.6 9.4 8.9  MG  --   --  1.9  PHOS  --   --  3.4   Liver Function Tests: Recent Labs  Lab 04/30/21 1344  AST 15  ALT 10  ALKPHOS 59  BILITOT 0.7  PROT 5.9*  ALBUMIN 3.1*   Recent Labs  Lab 04/30/21 1344  LIPASE 27   No results for input(s): AMMONIA in the last 168 hours. CBC: Recent Labs  Lab 04/30/21 1344 05/01/21 0313 05/01/21 1645 05/02/21 0456 05/02/21 1746 05/03/21 0526  WBC 5.6 4.9  --   --   --   --   NEUTROABS 4.0  --   --   --   --   --   HGB 7.1* 7.5* 7.2* 6.9* 8.5* 7.9*  HCT 22.2* 22.6* 21.7* 21.2* 26.1* 24.2*  MCV 81.3 80.4  --   --   --   --   PLT 272 213  --   --   --   --    Cardiac Enzymes: No results for input(s): CKTOTAL, CKMB, CKMBINDEX, TROPONINI in the last 168 hours. BNP: Invalid input(s): POCBNP CBG: Recent Labs  Lab 05/02/21 1104 05/02/21 1629 05/02/21 2141 05/03/21 0717 05/03/21 1104  GLUCAP 127* 118* 167* 132*  177*   D-Dimer No results for input(s): DDIMER in the last 72 hours. Hgb A1c Recent Labs    04/30/21 1344  HGBA1C 6.4*   Lipid Profile No results for input(s): CHOL, HDL, LDLCALC, TRIG, CHOLHDL, LDLDIRECT in the last 72 hours. Thyroid function studies No results for input(s): TSH, T4TOTAL, T3FREE, THYROIDAB in the last 72 hours.  Invalid input(s): FREET3 Anemia work up Recent Labs    04/30/21 1344  VITAMINB12 168*  FOLATE 14.6  FERRITIN 5*  TIBC 360  IRON 30   Urinalysis    Component Value Date/Time   COLORURINE YELLOW (A) 03/14/2019 0030   APPEARANCEUR CLOUDY (A) 03/14/2019 0030   LABSPEC 1.012 03/14/2019 0030   PHURINE 5.0 03/14/2019 0030   GLUCOSEU NEGATIVE 03/14/2019 0030   HGBUR NEGATIVE 03/14/2019 0030   BILIRUBINUR NEGATIVE 03/14/2019 0030   KETONESUR NEGATIVE 03/14/2019 0030   PROTEINUR NEGATIVE 03/14/2019 0030   NITRITE NEGATIVE 03/14/2019 0030   LEUKOCYTESUR TRACE (A) 03/14/2019 0030   Sepsis Labs Invalid input(s): PROCALCITONIN,  WBC,  LACTICIDVEN Microbiology Recent Results (from the past 240 hour(s))  Resp Panel by RT-PCR (Flu A&B, Covid) Nasopharyngeal Swab     Status: None   Collection Time: 04/30/21  4:20 PM   Specimen: Nasopharyngeal Swab; Nasopharyngeal(NP) swabs in vial transport medium  Result Value Ref Range Status   SARS Coronavirus 2 by RT PCR NEGATIVE NEGATIVE Final    Comment: (NOTE) SARS-CoV-2 target nucleic acids are NOT DETECTED.  The SARS-CoV-2 RNA is generally detectable in upper respiratory specimens during the acute phase of infection. The lowest concentration of SARS-CoV-2 viral copies this assay can detect is 138 copies/mL. A negative result does not preclude SARS-Cov-2 infection and should not be used as the sole basis for treatment or other patient management decisions. A negative result may occur with  improper specimen collection/handling, submission of specimen other than nasopharyngeal swab, presence of viral  mutation(s) within the areas targeted by this assay, and inadequate number of viral copies(<138 copies/mL). A negative result must be combined with clinical observations, patient history, and epidemiological information. The expected result is Negative.  Fact Sheet for Patients:  EntrepreneurPulse.com.au  Fact Sheet for Healthcare Providers:  IncredibleEmployment.be  This test is no t yet approved or cleared by the [place] FDA and  has been authorized for detection and/or diagnosis of SARS-CoV-2 by FDA under  an Emergency Use Authorization (EUA). This EUA will remain  in effect (meaning this test can be used) for the duration of the COVID-19 declaration under Section 564(b)(1) of the Act, 21 U.S.C.section 360bbb-3(b)(1), unless the authorization is terminated  or revoked sooner.       Influenza A by PCR NEGATIVE NEGATIVE Final   Influenza B by PCR NEGATIVE NEGATIVE Final    Comment: (NOTE) The Xpert Xpress SARS-CoV-2/FLU/RSV plus assay is intended as an aid in the diagnosis of influenza from Nasopharyngeal swab specimens and should not be used as a sole basis for treatment. Nasal washings and aspirates are unacceptable for Xpert Xpress SARS-CoV-2/FLU/RSV testing.  Fact Sheet for Patients: EntrepreneurPulse.com.au  Fact Sheet for Healthcare Providers: IncredibleEmployment.be  This test is not yet approved or cleared by the [place] FDA and has been authorized for detection and/or diagnosis of SARS-CoV-2 by FDA under an Emergency Use Authorization (EUA). This EUA will remain in effect (meaning this test can be used) for the duration of the COVID-19 declaration under Section 564(b)(1) of the Act, 21 U.S.C. section 360bbb-3(b)(1), unless the authorization is terminated or revoked.  Performed at [facility], [address], [place], [place] [address]      Time coordinating  discharge:  40 minutes  SIGNED:   Barb Merino, MD  [facility] 05/03/2021, 11:51 AM

## 2021-05-03 NOTE — Progress Notes (Signed)
Blood pressure (!) 129/47, pulse 60, temperature 98 F (36.7 C), resp. rate 18, height 5\' 7"  (1.702 m), weight 112 kg, SpO2 98 %. Iv cath removed site c/d/I discussed d/c packet with pt and husband both understand teaching scripts electronically sent to the RX pt d/c via w/c down to private car.

## 2021-07-08 ENCOUNTER — Encounter: Payer: Self-pay | Admitting: *Deleted

## 2021-07-08 NOTE — Anesthesia Preprocedure Evaluation (Addendum)
Anesthesia Evaluation  Patient identified by MRN, date of birth, ID band Patient awake    Reviewed: Allergy & Precautions, NPO status , Patient's Chart, lab work & pertinent test results, reviewed documented beta blocker date and time   Airway Mallampati: III  TM Distance: >3 FB Neck ROM: full    Dental no notable dental hx.    Pulmonary neg pulmonary ROS, former smoker,    Pulmonary exam normal breath sounds clear to auscultation       Cardiovascular Exercise Tolerance: Good hypertension, Pt. on medications and Pt. on home beta blockers + dysrhythmias Atrial Fibrillation  Rhythm:Regular Rate:Normal + Peripheral Edema    Neuro/Psych Depression negative neurological ROS  negative psych ROS   GI/Hepatic Neg liver ROS, PUD (h/o ulcer),   Endo/Other  diabetes, Well Controlled, Type 2Hypothyroidism   Renal/GU negative Renal ROS  negative genitourinary   Musculoskeletal negative musculoskeletal ROS (+)   Abdominal Normal abdominal exam  (+)   Peds negative pediatric ROS (+)  Hematology  (+) Blood dyscrasia, anemia ,   Anesthesia Other Findings Past Medical History: No date: Anemia No date: Cancer ([place]) No date: Cystocele No date: Depression No date: Diabetes mellitus without complication (HCC) No date: Glaucoma No date: Hyperlipidemia No date: Hypertension No date: Hypothyroidism  Past Surgical History: 07/2012: BREAST EXCISIONAL BIOPSY; Left No date: CHOLECYSTECTOMY 08/17/2015: COLONOSCOPY WITH PROPOFOL; N/A     Comment:  Procedure: COLONOSCOPY WITH PROPOFOL;  Surgeon: Josefine Class, MD;  Location[facility] ENDOSCOPY;  Service:               Endoscopy;  Laterality: N/A; 02/25/2019: ESOPHAGOGASTRODUODENOSCOPY; N/A     Comment:  Procedure: ESOPHAGOGASTRODUODENOSCOPY (EGD);  Surgeon:               Virgel Manifold, MD;  Location: [facility] ENDOSCOPY;                Service: Endoscopy;  Laterality:  N/A; 05/02/2021: ESOPHAGOGASTRODUODENOSCOPY; N/A     Comment:  Procedure: ESOPHAGOGASTRODUODENOSCOPY (EGD);  Surgeon:               Lucilla Lame, MD;  Location: [facility] ENDOSCOPY;  Service:               Endoscopy;  Laterality: N/A; 04/13/2019: ESOPHAGOGASTRODUODENOSCOPY (EGD) WITH PROPOFOL; N/A     Comment:  Procedure: ESOPHAGOGASTRODUODENOSCOPY (EGD) WITH               PROPOFOL;  Surgeon: Virgel Manifold, MD;  Location:               [facility] ENDOSCOPY;  Service: Endoscopy;  Laterality: N/A;     Reproductive/Obstetrics negative OB ROS                            Anesthesia Physical  Anesthesia Plan  ASA: 2  Anesthesia Plan: General   Post-op Pain Management:    Induction: Intravenous  PONV Risk Score and Plan: Propofol infusion and TIVA  Airway Management Planned: Natural Airway and Nasal Cannula  Additional Equipment:   Intra-op Plan:   Post-operative Plan:   Informed Consent: I have reviewed the patients History and Physical, chart, labs and discussed the procedure including the risks, benefits and alternatives for the proposed anesthesia with the patient or authorized representative who has indicated his/her understanding and acceptance.     Dental advisory given  Plan  Discussed with: Anesthesiologist, CRNA and Surgeon  Anesthesia Plan Comments:        Anesthesia Quick Evaluation

## 2021-07-09 ENCOUNTER — Ambulatory Visit: Payer: Medicare PPO | Admitting: Anesthesiology

## 2021-07-09 ENCOUNTER — Ambulatory Visit
Admission: RE | Admit: 2021-07-09 | Discharge: 2021-07-09 | Disposition: A | Discharge status: Home / Self Care | Payer: Medicare PPO | Attending: Gastroenterology | Admitting: Gastroenterology

## 2021-07-09 ENCOUNTER — Encounter
Admission: RE | Disposition: A | Discharge status: Home / Self Care | Payer: Self-pay | Source: Home / Self Care | Attending: Gastroenterology

## 2021-07-09 DIAGNOSIS — Z7984 Long term (current) use of oral hypoglycemic drugs: Secondary | ICD-10-CM | POA: Diagnosis not present

## 2021-07-09 DIAGNOSIS — Z9049 Acquired absence of other specified parts of digestive tract: Secondary | ICD-10-CM | POA: Insufficient documentation

## 2021-07-09 DIAGNOSIS — E119 Type 2 diabetes mellitus without complications: Secondary | ICD-10-CM | POA: Insufficient documentation

## 2021-07-09 DIAGNOSIS — Z8711 Personal history of peptic ulcer disease: Secondary | ICD-10-CM | POA: Insufficient documentation

## 2021-07-09 DIAGNOSIS — Z87891 Personal history of nicotine dependence: Secondary | ICD-10-CM | POA: Insufficient documentation

## 2021-07-09 DIAGNOSIS — E039 Hypothyroidism, unspecified: Secondary | ICD-10-CM | POA: Diagnosis not present

## 2021-07-09 DIAGNOSIS — I10 Essential (primary) hypertension: Secondary | ICD-10-CM | POA: Insufficient documentation

## 2021-07-09 DIAGNOSIS — E785 Hyperlipidemia, unspecified: Secondary | ICD-10-CM | POA: Diagnosis not present

## 2021-07-09 DIAGNOSIS — Z79899 Other long term (current) drug therapy: Secondary | ICD-10-CM | POA: Insufficient documentation

## 2021-07-09 DIAGNOSIS — F32A Depression, unspecified: Secondary | ICD-10-CM | POA: Diagnosis not present

## 2021-07-09 DIAGNOSIS — D509 Iron deficiency anemia, unspecified: Secondary | ICD-10-CM | POA: Diagnosis not present

## 2021-07-09 HISTORY — PX: COLONOSCOPY WITH PROPOFOL: SHX5780

## 2021-07-09 LAB — GLUCOSE, CAPILLARY: Glucose-Capillary: 203 mg/dL — ABNORMAL HIGH (ref 70–99)

## 2021-07-09 SURGERY — COLONOSCOPY WITH PROPOFOL
Anesthesia: General

## 2021-07-09 MED ORDER — PROPOFOL 500 MG/50ML IV EMUL
INTRAVENOUS | Status: DC | PRN
  Administered 2021-07-09: 145 ug/kg/min via INTRAVENOUS

## 2021-07-09 MED ORDER — SODIUM CHLORIDE 0.9 % IV SOLN: INTRAVENOUS | Status: DC

## 2021-07-09 MED ORDER — PROPOFOL 10 MG/ML IV BOLUS
INTRAVENOUS | Status: DC | PRN
  Administered 2021-07-09: 40 mg via INTRAVENOUS
  Administered 2021-07-09: 50 mg via INTRAVENOUS
  Administered 2021-07-09: 20 mg via INTRAVENOUS

## 2021-07-09 MED ORDER — PHENYLEPHRINE 40 MCG/ML (10ML) SYRINGE FOR IV PUSH (FOR BLOOD PRESSURE SUPPORT)
PREFILLED_SYRINGE | INTRAVENOUS | Status: DC | PRN
  Administered 2021-07-09: 120 ug via INTRAVENOUS

## 2021-07-09 NOTE — H&P (Signed)
Outpatient short stay form Pre-procedure 07/09/2021  Lesly Rubenstein, MD  Primary Physician: [name], MD  Reason for visit:  IDA  History of present illness:    76 y/o lady with history of hypertension, DM II, hypothyroidism, and PUD here for colonoscopy for IDA. Last colonoscopy in 2017 was normal. Takes xarelto with last dose being 4 days ago. History of cholecystectomy. Had a grandmother with colon cancer in her 21's.    Current Facility-Administered Medications:    0.9 %  sodium chloride infusion, , Intravenous, Continuous, Wenceslaus Gist, Hilton Cork, MD  Medications Prior to Admission  Medication Sig Dispense Refill Last Dose   amLODipine (NORVASC) 5 MG tablet Take 1 tablet (5 mg total) by mouth at bedtime. 30 tablet 0 07/08/2021   Colesevelam HCl 3.75 g PACK Take 3.75 g by mouth.      diclofenac Sodium (VOLTAREN) 1 % GEL Apply 2 g topically 4 (four) times daily.      ferrous sulfate 325 (65 FE) MG tablet Take 325 mg by mouth daily with breakfast.      glimepiride (AMARYL) 2 MG tablet Take 2 mg by mouth 2 (two) times daily.   07/08/2021   lisinopril (ZESTRIL) 20 MG tablet Take 20 mg by mouth daily.   07/08/2021   metFORMIN (GLUCOPHAGE) 1000 MG tablet Take 1,000 mg by mouth 2 (two) times daily with a meal.    07/08/2021   metoprolol succinate (TOPROL-XL) 25 MG 24 hr tablet Take 25 mg by mouth daily.   07/08/2021   pantoprazole (PROTONIX) 40 MG tablet Take 1 tablet (40 mg total) by mouth 2 (two) times daily. 30 tablet 2 07/08/2021   pioglitazone (ACTOS) 30 MG tablet Take 30 mg by mouth daily.   07/08/2021   rivaroxaban (XARELTO) 20 MG TABS tablet Take 20 mg by mouth daily with supper.   07/05/2021   sitaGLIPtin (JANUVIA) 100 MG tablet Take 100 mg by mouth daily.   07/08/2021   SYNTHROID 150 MCG tablet Take 150 mcg by mouth daily.  11 07/08/2021   traMADol (ULTRAM) 50 MG tablet Take 50 mg by mouth every 6 (six) hours as needed.      cholecalciferol (VITAMIN D) 1000 units tablet Take  1,000 Units by mouth daily.      cyclobenzaprine (FLEXERIL) 5 MG tablet Take 1 tablet (5 mg total) by mouth 3 (three) times daily as needed for muscle spasms. 30 tablet 0    diazepam (VALIUM) 5 MG tablet Take 2.5-5 mg by mouth every 8 (eight) hours as needed for anxiety.       furosemide (LASIX) 40 MG tablet Take 40 mg by mouth daily.      lidocaine (LIDODERM) 5 % Place 1 patch onto the skin daily. Remove & Discard patch within 12 hours or as directed by MD 30 patch 0    liothyronine (CYTOMEL) 50 MCG tablet Take 50 mcg by mouth every morning.      Multiple Vitamin (MULTI-VITAMINS) TABS Take 1 tablet by mouth daily.       oxyCODONE (OXY IR/ROXICODONE) 5 MG immediate release tablet Take 1 tablet (5 mg total) by mouth every 6 (six) hours as needed for moderate pain or breakthrough pain. 20 tablet 0    vitamin B-12 1000 MCG tablet Take 1 tablet (1,000 mcg total) by mouth daily. 30 tablet 2      Allergies  Allergen Reactions   Lipitor [Atorvastatin]     Severe weakness, joint pain   Pravastatin  Severe weakness, joint pain   Codeine    Morphine And Related    Vytorin [Ezetimibe-Simvastatin]      Past Medical History:  Diagnosis Date   Anemia    Cancer ([place])    Cystocele    Depression    Diabetes mellitus without complication ([place])    Glaucoma    Hyperlipidemia    Hypertension    Hypothyroidism     Review of systems:  Otherwise negative.    Physical Exam  Gen: Alert, oriented. Appears stated age.  HEENT: PERRLA. Lungs: No respiratory distress CV: RRR Abd: soft, benign, no masses Ext: No edema    Planned procedures: Proceed with colonoscopy. The patient understands the nature of the planned procedure, indications, risks, alternatives and potential complications including but not limited to bleeding, infection, perforation, damage to internal organs and possible oversedation/side effects from anesthesia. The patient agrees and gives consent to proceed.  Please refer to  procedure notes for findings, recommendations and patient disposition/instructions.     Lesly Rubenstein, MD [facility] Gastroenter[facility]

## 2021-07-09 NOTE — Interval H&P Note (Signed)
History and Physical Interval Note:  07/09/2021 8:27 AM  Katherine [name]  has presented today for surgery, with the diagnosis of IDA.  The various methods of treatment have been discussed with the patient and family. After consideration of risks, benefits and other options for treatment, the patient has consented to  Procedure(s): COLONOSCOPY WITH PROPOFOL (N/A) as a surgical intervention.  The patient's history has been reviewed, patient examined, no change in status, stable for surgery.  I have reviewed the patient's chart and labs.  Questions were answered to the patient's satisfaction.     Lesly Rubenstein  Ok to proceed with colonoscopy

## 2021-07-09 NOTE — Anesthesia Postprocedure Evaluation (Signed)
Anesthesia Post Note  Patient: Katherine [name]  Procedure(s) Performed: COLONOSCOPY WITH PROPOFOL  Patient location during evaluation: Endoscopy Anesthesia Type: General Level of consciousness: awake and alert Pain management: pain level controlled Vital Signs Assessment: post-procedure vital signs reviewed and stable Respiratory status: spontaneous breathing, nonlabored ventilation and respiratory function stable Cardiovascular status: blood pressure returned to baseline and stable Postop Assessment: no apparent nausea or vomiting Anesthetic complications: no   No notable events documented.   Last Vitals:  Vitals:   07/09/21 0815 07/09/21 0913  BP: (!) 173/87   Pulse: 85   Resp: 18   Temp: (!) 35.7 C (!) 35.6 C  SpO2: 100% 97%    Last Pain:  Vitals:   07/09/21 0943  TempSrc:   PainSc: 0-No pain                 Iran Ouch

## 2021-07-09 NOTE — Op Note (Signed)
[facility] Gastroenterology Patient Name: Katherine [name] Procedure Date: 07/09/2021 8:20 AM MRN: [id number] Account #: [id number] Date of Birth: [birth year] Admit Type: Outpatient Age: 76 Room: [facility] ENDO ROOM 1 Gender: Female Note Status: Finalized Instrument Name: Jasper Riling [id number] Procedure:             Colonoscopy Indications:           Iron deficiency anemia Providers:             Andrey Farmer MD, MD Medicines:             Monitored Anesthesia Care Complications:         No immediate complications. Procedure:             Pre-Anesthesia Assessment:                        - Prior to the procedure, a History and Physical was                         performed, and patient medications and allergies were                         reviewed. The patient is competent. The risks and                         benefits of the procedure and the sedation options and                         risks were discussed with the patient. All questions                         were answered and informed consent was obtained.                         Patient identification and proposed procedure were                         verified by the physician, the nurse, the                         anesthesiologist, the anesthetist and the technician                         in the endoscopy suite. Mental Status Examination:                         alert and oriented. Airway Examination: normal                         oropharyngeal airway and neck mobility. Respiratory                         Examination: clear to auscultation. CV Examination:                         normal. Prophylactic Antibiotics: The patient does not                         require prophylactic antibiotics. Prior  Anticoagulants: The patient has taken Xarelto                         (rivaroxaban), last dose was 4 days prior to                         procedure. ASA Grade Assessment: II - A patient with                          mild systemic disease. After reviewing the risks and                         benefits, the patient was deemed in satisfactory                         condition to undergo the procedure. The anesthesia                         plan was to use monitored anesthesia care (MAC).                         Immediately prior to administration of medications,                         the patient was re-assessed for adequacy to receive                         sedatives. The heart rate, respiratory rate, oxygen                         saturations, blood pressure, adequacy of pulmonary                         ventilation, and response to care were monitored                         throughout the procedure. The physical status of the                         patient was re-assessed after the procedure.                        After obtaining informed consent, the colonoscope was                         passed under direct vision. Throughout the procedure,                         the patient's blood pressure, pulse, and oxygen                         saturations were monitored continuously. The                         Colonoscope was introduced through the anus and                         advanced to the the terminal ileum. The colonoscopy  was somewhat difficult due to a redundant colon.                         Successful completion of the procedure was aided by                         applying abdominal pressure. The patient tolerated the                         procedure well. The quality of the bowel preparation                         was good. Findings:      The perianal and digital rectal examinations were normal.      The terminal ileum appeared normal.      The entire examined colon appeared normal on direct and retroflexion       views. Impression:            - The examined portion of the ileum was normal.                        - The entire examined  colon is normal on direct and                         retroflexion views.                        - No specimens collected. Recommendation:        - Discharge patient to home.                        - Resume previous diet.                        - Continue present medications.                        - Repeat colonoscopy is not recommended due to current                         age (23 years or older) for screening purposes.                        - Return to referring physician as previously                         scheduled. Procedure Code(s):     --- Professional ---                        [phone], Colonoscopy, flexible; diagnostic, including                         collection of specimen(s) by brushing or washing, when                         performed (separate procedure) Diagnosis Code(s):     --- Professional ---                        D50.9, Iron deficiency  anemia, unspecified CPT copyright 2019 American Medical Association. All rights reserved. The codes documented in this report are preliminary and upon coder review may  be revised to meet current compliance requirements. Andrey Farmer MD, MD 07/09/2021 9:13:57 AM Number of Addenda: 0 Note Initiated On: 07/09/2021 8:20 AM Scope Withdrawal Time: 0 hours 10 minutes 25 seconds  Total Procedure Duration: 0 hours 25 minutes 18 seconds  Estimated Blood Loss:  Estimated blood loss: none.      [facility]

## 2021-07-09 NOTE — Transfer of Care (Signed)
Immediate Anesthesia Transfer of Care Note  Patient: Katherine [name]  Procedure(s) Performed: COLONOSCOPY WITH PROPOFOL  Patient Location: PACU  Anesthesia Type:General  Level of Consciousness: awake and alert   Airway & Oxygen Therapy: Patient Spontanous Breathing and Patient connected to nasal cannula oxygen  Post-op Assessment: Report given to RN and Post -op Vital signs reviewed and stable  Post vital signs: Reviewed and stable  Last Vitals:  Vitals Value Taken Time  BP 97/35 07/09/21 0914  Temp    Pulse 60 07/09/21 0914  Resp 23 07/09/21 0914  SpO2 72 % 07/09/21 0914  Vitals shown include unvalidated device data.  Last Pain:  Vitals:   07/09/21 0815  TempSrc: Temporal  PainSc: 0-No pain         Complications: No notable events documented.

## 2021-07-10 ENCOUNTER — Encounter: Payer: Self-pay | Admitting: Gastroenterology

## 2021-10-03 ENCOUNTER — Other Ambulatory Visit: Payer: Self-pay | Admitting: Family Medicine

## 2021-10-03 DIAGNOSIS — Z1231 Encounter for screening mammogram for malignant neoplasm of breast: Secondary | ICD-10-CM

## 2021-10-14 ENCOUNTER — Ambulatory Visit
Admission: RE | Admit: 2021-10-14 | Discharge: 2021-10-14 | Disposition: A | Discharge status: Home / Self Care | Payer: Medicare PPO | Source: Ambulatory Visit | Attending: Family Medicine | Admitting: Family Medicine

## 2021-10-14 DIAGNOSIS — Z1231 Encounter for screening mammogram for malignant neoplasm of breast: Secondary | ICD-10-CM | POA: Diagnosis present

## 2021-10-14 IMAGING — MG MM DIGITAL SCREENING BILAT W/ TOMO AND CAD
8 series · 8 of 24 positions shown · non-contrast
Comparison: Previous exam(s).

CLINICAL DATA: Screening.

EXAM:
DIGITAL SCREENING BILATERAL MAMMOGRAM WITH TOMOSYNTHESIS AND CAD
TECHNIQUE: Bilateral screening digital craniocaudal and mediolateral oblique
mammograms were obtained. Bilateral screening digital breast
tomosynthesis was performed. The images were evaluated with
computer-aided detection.

[L CC synth-2D]
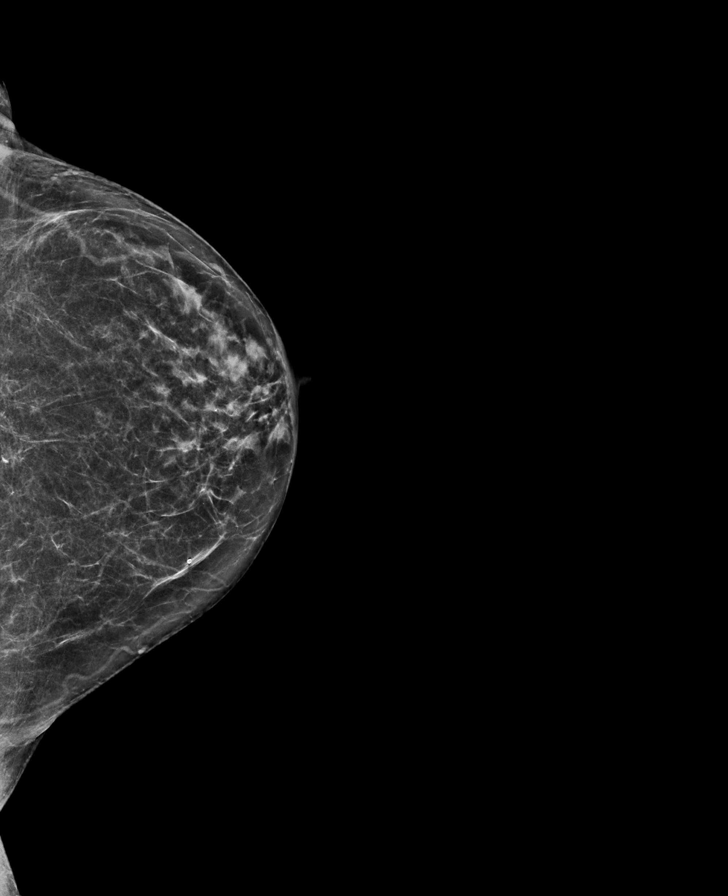

[R CC synth-2D]
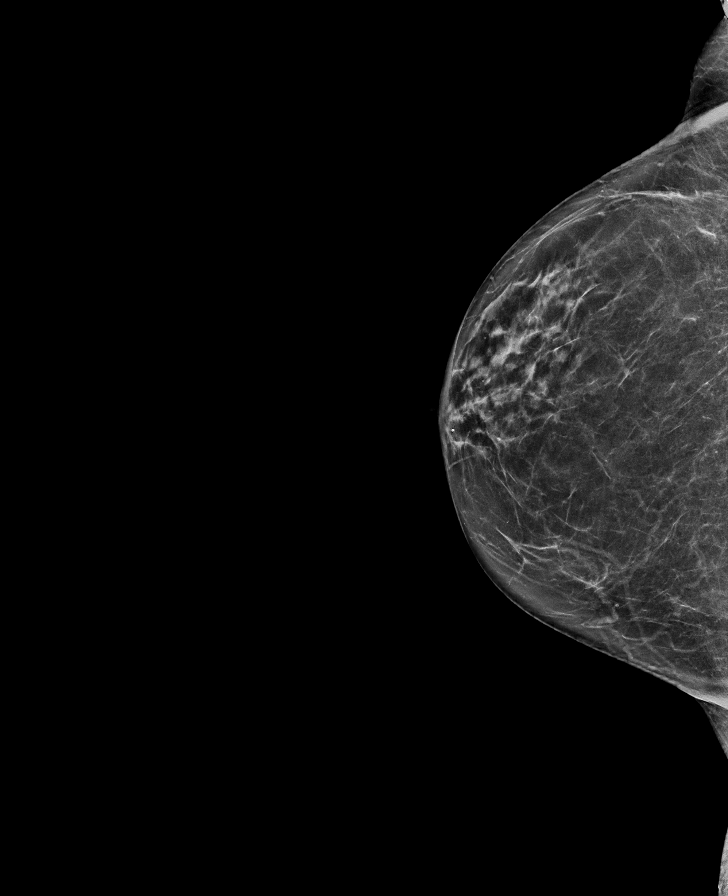

[R MLO synth-2D]
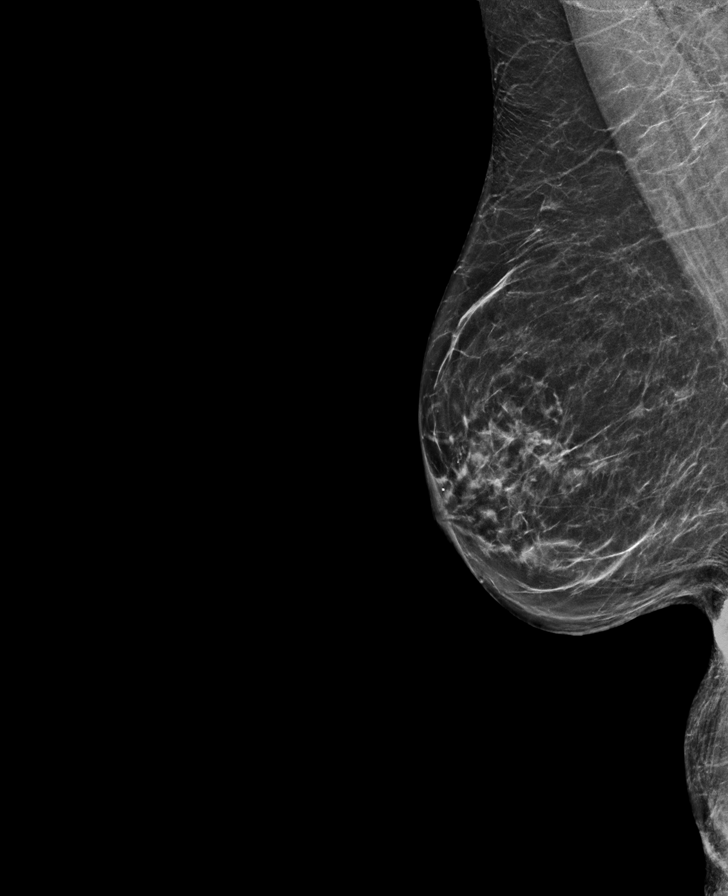

[L MLO synth-2D]
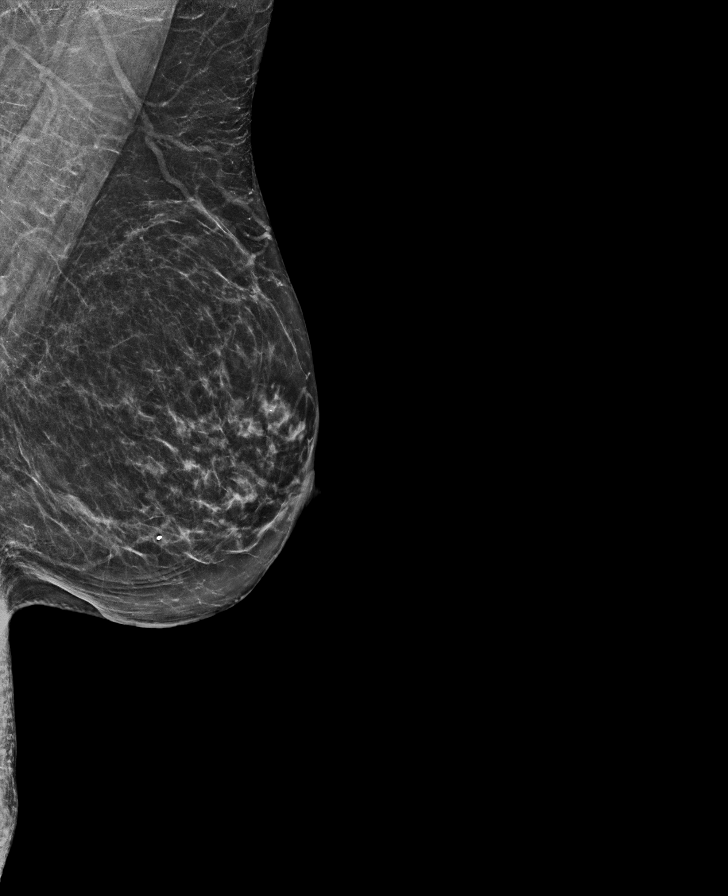

[L CC tomo · tomo slice 33/65.0]
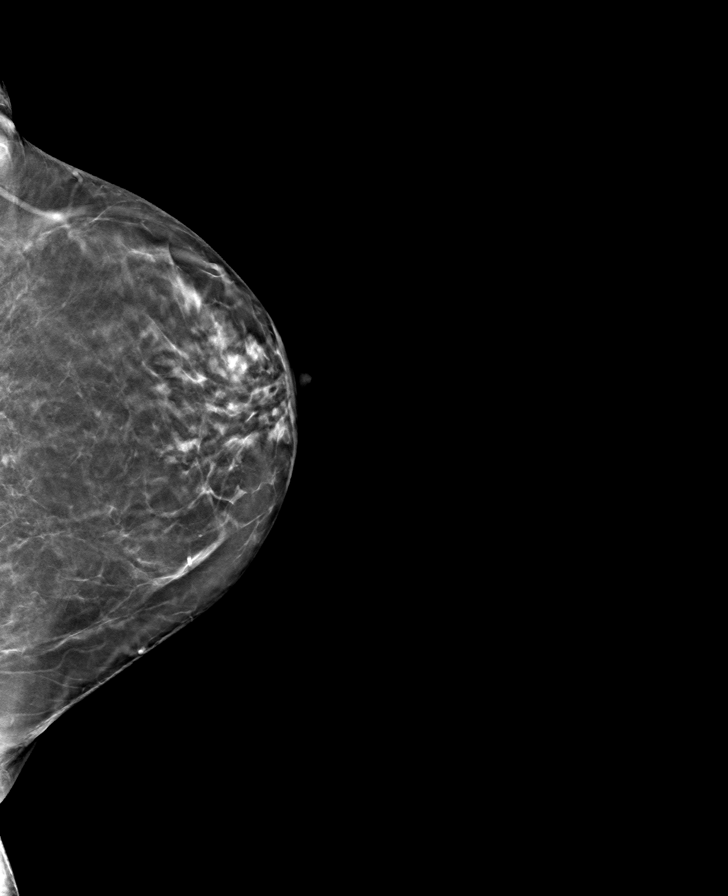

[R CC tomo · tomo slice 34/67.0]
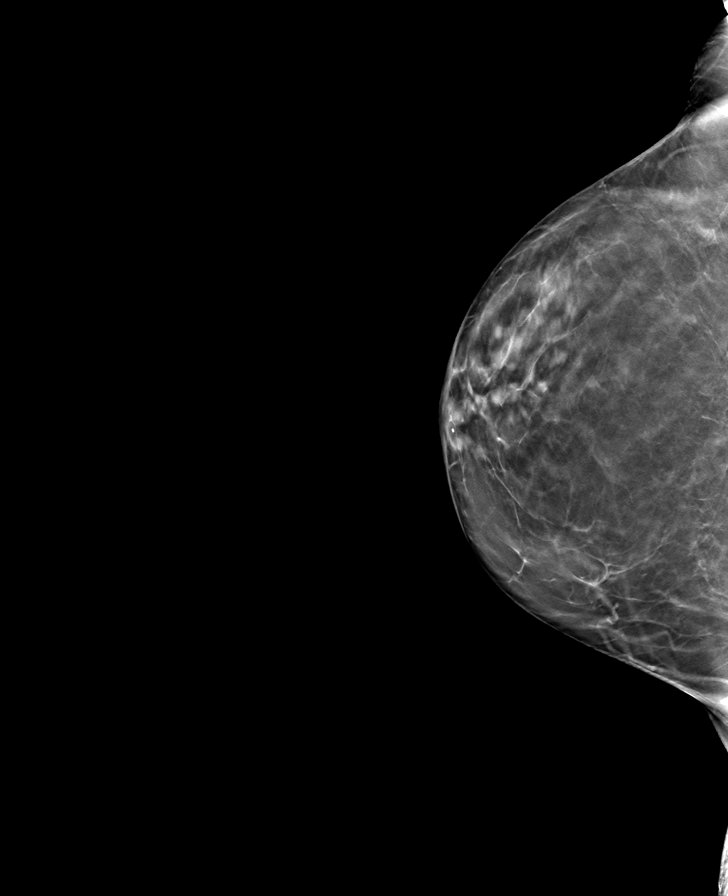

[R MLO tomo · tomo slice 35/69.0]
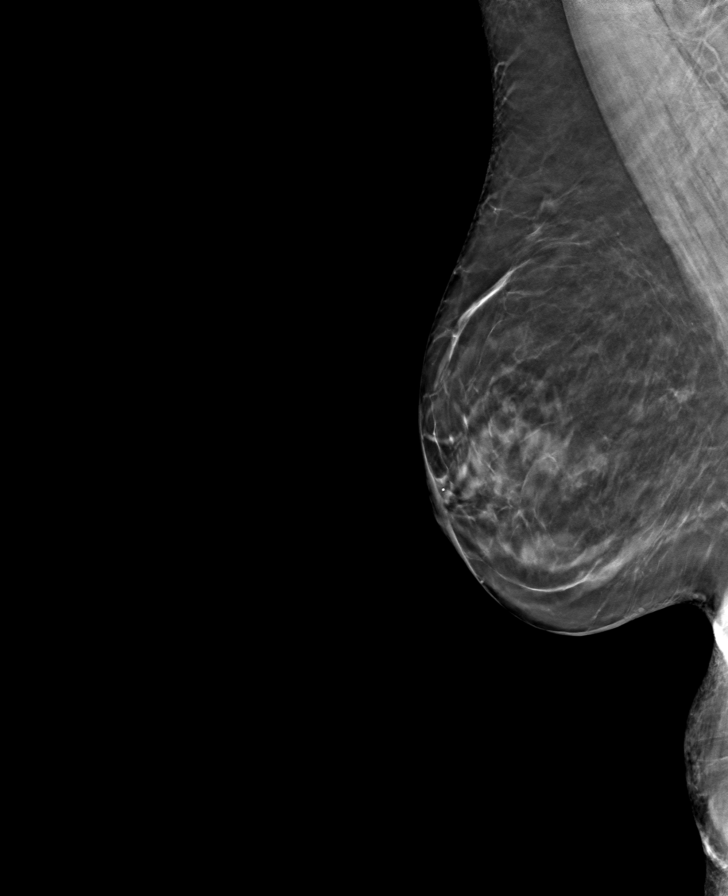

[L MLO tomo · tomo slice 35/68.0]
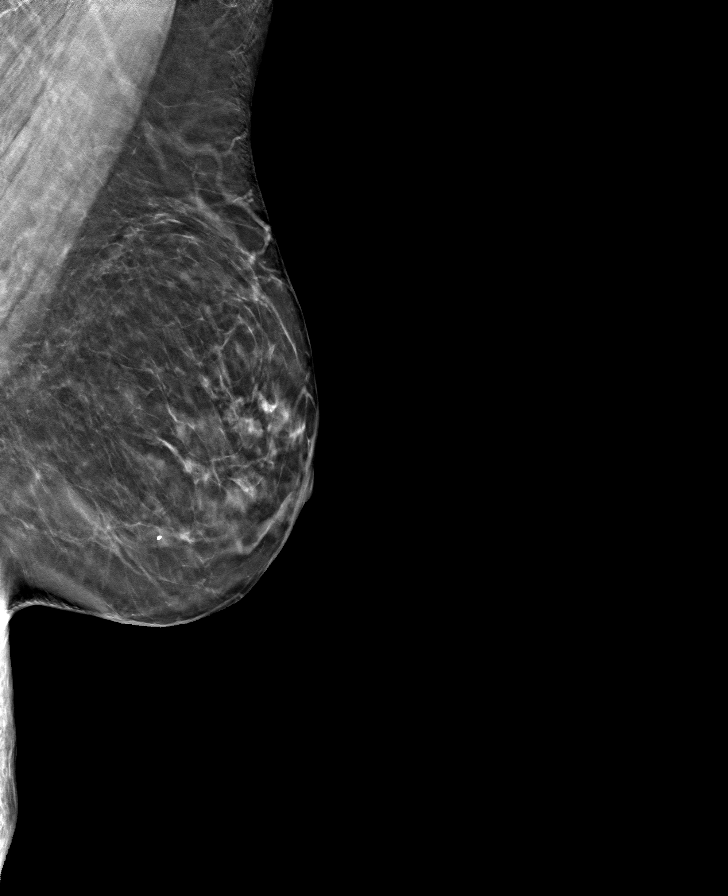

[8 of 24 positions shown; findings below may reference images not displayed]

ACR Breast Density Category b: There are scattered areas of
fibroglandular density.
FINDINGS: In the right breast, a possible asymmetry warrants further
evaluation. In the left breast, no findings suspicious for
malignancy.
IMPRESSION: Further evaluation is suggested for possible asymmetry in the right
breast.

RECOMMENDATION:
Diagnostic mammogram and possibly ultrasound of the right breast.
(Code:[CB])

The patient will be contacted regarding the findings, and additional
imaging will be scheduled.

BI-RADS CATEGORY  0: Incomplete. Need additional imaging evaluation
and/or prior mammograms for comparison.

## 2021-10-17 ENCOUNTER — Other Ambulatory Visit: Payer: Self-pay | Admitting: Family Medicine

## 2021-10-17 DIAGNOSIS — R928 Other abnormal and inconclusive findings on diagnostic imaging of breast: Secondary | ICD-10-CM

## 2021-10-17 DIAGNOSIS — N6489 Other specified disorders of breast: Secondary | ICD-10-CM

## 2021-11-05 ENCOUNTER — Other Ambulatory Visit: Payer: Medicare PPO

## 2021-11-27 ENCOUNTER — Ambulatory Visit
Admission: RE | Admit: 2021-11-27 | Discharge: 2021-11-27 | Disposition: A | Discharge status: Home / Self Care | Payer: Medicare PPO | Source: Ambulatory Visit | Attending: Family Medicine | Admitting: Family Medicine

## 2021-11-27 DIAGNOSIS — N6489 Other specified disorders of breast: Secondary | ICD-10-CM

## 2021-11-27 DIAGNOSIS — R928 Other abnormal and inconclusive findings on diagnostic imaging of breast: Secondary | ICD-10-CM

## 2021-11-29 ENCOUNTER — Other Ambulatory Visit: Payer: Self-pay | Admitting: Family Medicine

## 2021-11-29 DIAGNOSIS — R928 Other abnormal and inconclusive findings on diagnostic imaging of breast: Secondary | ICD-10-CM

## 2021-11-29 DIAGNOSIS — N6489 Other specified disorders of breast: Secondary | ICD-10-CM

## 2021-12-23 ENCOUNTER — Ambulatory Visit
Admission: RE | Admit: 2021-12-23 | Discharge: 2021-12-23 | Disposition: A | Discharge status: Home / Self Care | Payer: Medicare PPO | Source: Ambulatory Visit | Attending: Family Medicine | Admitting: Family Medicine

## 2021-12-23 DIAGNOSIS — N6489 Other specified disorders of breast: Secondary | ICD-10-CM | POA: Insufficient documentation

## 2021-12-23 DIAGNOSIS — R928 Other abnormal and inconclusive findings on diagnostic imaging of breast: Secondary | ICD-10-CM | POA: Diagnosis present

## 2021-12-23 HISTORY — PX: BREAST BIOPSY: SHX20

## 2021-12-24 LAB — SURGICAL PATHOLOGY

## 2022-12-08 ENCOUNTER — Other Ambulatory Visit: Payer: Self-pay | Admitting: Family Medicine

## 2022-12-08 DIAGNOSIS — Z1231 Encounter for screening mammogram for malignant neoplasm of breast: Secondary | ICD-10-CM

## 2022-12-16 ENCOUNTER — Ambulatory Visit
Admission: RE | Admit: 2022-12-16 | Discharge: 2022-12-16 | Disposition: A | Discharge status: Home / Self Care | Payer: Medicare PPO | Source: Ambulatory Visit | Attending: Family Medicine | Admitting: Family Medicine

## 2022-12-16 DIAGNOSIS — Z1231 Encounter for screening mammogram for malignant neoplasm of breast: Secondary | ICD-10-CM | POA: Diagnosis not present

## 2023-07-28 ENCOUNTER — Emergency Department

## 2023-07-28 ENCOUNTER — Inpatient Hospital Stay
Admission: EM | Admit: 2023-07-28 | Discharge: 2023-08-07 | DRG: 438 | Disposition: A | Discharge status: Home / Self Care | Attending: Student in an Organized Health Care Education/Training Program | Admitting: Student in an Organized Health Care Education/Training Program

## 2023-07-28 ENCOUNTER — Other Ambulatory Visit: Payer: Self-pay

## 2023-07-28 ENCOUNTER — Encounter: Payer: Self-pay | Admitting: Emergency Medicine

## 2023-07-28 DIAGNOSIS — Z66 Do not resuscitate: Secondary | ICD-10-CM | POA: Diagnosis present

## 2023-07-28 DIAGNOSIS — K529 Noninfective gastroenteritis and colitis, unspecified: Secondary | ICD-10-CM | POA: Diagnosis present

## 2023-07-28 DIAGNOSIS — Z888 Allergy status to other drugs, medicaments and biological substances status: Secondary | ICD-10-CM

## 2023-07-28 DIAGNOSIS — R680 Hypothermia, not associated with low environmental temperature: Secondary | ICD-10-CM | POA: Diagnosis present

## 2023-07-28 DIAGNOSIS — I480 Paroxysmal atrial fibrillation: Secondary | ICD-10-CM | POA: Diagnosis not present

## 2023-07-28 DIAGNOSIS — E871 Hypo-osmolality and hyponatremia: Secondary | ICD-10-CM | POA: Diagnosis present

## 2023-07-28 DIAGNOSIS — E1165 Type 2 diabetes mellitus with hyperglycemia: Secondary | ICD-10-CM | POA: Diagnosis present

## 2023-07-28 DIAGNOSIS — Z6831 Body mass index (BMI) 31.0-31.9, adult: Secondary | ICD-10-CM

## 2023-07-28 DIAGNOSIS — J111 Influenza due to unidentified influenza virus with other respiratory manifestations: Secondary | ICD-10-CM | POA: Diagnosis not present

## 2023-07-28 DIAGNOSIS — Z1152 Encounter for screening for COVID-19: Secondary | ICD-10-CM

## 2023-07-28 DIAGNOSIS — Z87891 Personal history of nicotine dependence: Secondary | ICD-10-CM

## 2023-07-28 DIAGNOSIS — N179 Acute kidney failure, unspecified: Secondary | ICD-10-CM | POA: Diagnosis not present

## 2023-07-28 DIAGNOSIS — R001 Bradycardia, unspecified: Secondary | ICD-10-CM | POA: Diagnosis present

## 2023-07-28 DIAGNOSIS — H409 Unspecified glaucoma: Secondary | ICD-10-CM | POA: Diagnosis present

## 2023-07-28 DIAGNOSIS — E876 Hypokalemia: Secondary | ICD-10-CM | POA: Diagnosis not present

## 2023-07-28 DIAGNOSIS — E8721 Acute metabolic acidosis: Secondary | ICD-10-CM | POA: Diagnosis present

## 2023-07-28 DIAGNOSIS — E11649 Type 2 diabetes mellitus with hypoglycemia without coma: Secondary | ICD-10-CM | POA: Diagnosis present

## 2023-07-28 DIAGNOSIS — E66811 Obesity, class 1: Secondary | ICD-10-CM | POA: Diagnosis present

## 2023-07-28 DIAGNOSIS — Z85828 Personal history of other malignant neoplasm of skin: Secondary | ICD-10-CM

## 2023-07-28 DIAGNOSIS — N170 Acute kidney failure with tubular necrosis: Secondary | ICD-10-CM | POA: Diagnosis present

## 2023-07-28 DIAGNOSIS — Z794 Long term (current) use of insulin: Secondary | ICD-10-CM | POA: Diagnosis not present

## 2023-07-28 DIAGNOSIS — T383X5A Adverse effect of insulin and oral hypoglycemic [antidiabetic] drugs, initial encounter: Secondary | ICD-10-CM | POA: Diagnosis present

## 2023-07-28 DIAGNOSIS — J09X1 Influenza due to identified novel influenza A virus with pneumonia: Secondary | ICD-10-CM | POA: Diagnosis not present

## 2023-07-28 DIAGNOSIS — E875 Hyperkalemia: Secondary | ICD-10-CM | POA: Diagnosis present

## 2023-07-28 DIAGNOSIS — I129 Hypertensive chronic kidney disease with stage 1 through stage 4 chronic kidney disease, or unspecified chronic kidney disease: Secondary | ICD-10-CM | POA: Diagnosis present

## 2023-07-28 DIAGNOSIS — K859 Acute pancreatitis without necrosis or infection, unspecified: Principal | ICD-10-CM | POA: Diagnosis present

## 2023-07-28 DIAGNOSIS — E872 Acidosis, unspecified: Secondary | ICD-10-CM

## 2023-07-28 DIAGNOSIS — Z833 Family history of diabetes mellitus: Secondary | ICD-10-CM

## 2023-07-28 DIAGNOSIS — F32A Depression, unspecified: Secondary | ICD-10-CM | POA: Diagnosis present

## 2023-07-28 DIAGNOSIS — K8500 Idiopathic acute pancreatitis without necrosis or infection: Secondary | ICD-10-CM

## 2023-07-28 DIAGNOSIS — Z992 Dependence on renal dialysis: Secondary | ICD-10-CM | POA: Diagnosis not present

## 2023-07-28 DIAGNOSIS — Z7984 Long term (current) use of oral hypoglycemic drugs: Secondary | ICD-10-CM

## 2023-07-28 DIAGNOSIS — E039 Hypothyroidism, unspecified: Secondary | ICD-10-CM | POA: Diagnosis present

## 2023-07-28 DIAGNOSIS — N1832 Chronic kidney disease, stage 3b: Secondary | ICD-10-CM | POA: Diagnosis present

## 2023-07-28 DIAGNOSIS — Z7901 Long term (current) use of anticoagulants: Secondary | ICD-10-CM

## 2023-07-28 DIAGNOSIS — E1122 Type 2 diabetes mellitus with diabetic chronic kidney disease: Secondary | ICD-10-CM | POA: Diagnosis present

## 2023-07-28 DIAGNOSIS — E16A3 Hypoglycemia level 3: Secondary | ICD-10-CM | POA: Diagnosis present

## 2023-07-28 DIAGNOSIS — R112 Nausea with vomiting, unspecified: Secondary | ICD-10-CM | POA: Diagnosis present

## 2023-07-28 DIAGNOSIS — K5900 Constipation, unspecified: Secondary | ICD-10-CM | POA: Diagnosis not present

## 2023-07-28 DIAGNOSIS — E785 Hyperlipidemia, unspecified: Secondary | ICD-10-CM | POA: Diagnosis present

## 2023-07-28 DIAGNOSIS — Z79899 Other long term (current) drug therapy: Secondary | ICD-10-CM

## 2023-07-28 DIAGNOSIS — Z7985 Long-term (current) use of injectable non-insulin antidiabetic drugs: Secondary | ICD-10-CM

## 2023-07-28 DIAGNOSIS — Z885 Allergy status to narcotic agent status: Secondary | ICD-10-CM

## 2023-07-28 DIAGNOSIS — Z79890 Hormone replacement therapy (postmenopausal): Secondary | ICD-10-CM

## 2023-07-28 LAB — TROPONIN I (HIGH SENSITIVITY)
Troponin I (High Sensitivity): 17 ng/L (ref <18)
Troponin I (High Sensitivity): 18 ng/L — ABNORMAL HIGH (ref <18)

## 2023-07-28 LAB — CORTISOL: Cortisol, Plasma: 45.3 ug/dL

## 2023-07-28 LAB — BASIC METABOLIC PANEL
Anion gap: 33 — ABNORMAL HIGH (ref 5–15)
BUN: 112 mg/dL — ABNORMAL HIGH (ref 8–23)
CO2: 9 mmol/L — ABNORMAL LOW (ref 22–32)
Calcium: 10.6 mg/dL — ABNORMAL HIGH (ref 8.9–10.3)
Chloride: 89 mmol/L — ABNORMAL LOW (ref 98–111)
Creatinine, Ser: 10.29 mg/dL — ABNORMAL HIGH (ref 0.44–1.00)
GFR, Estimated: 4 mL/min — ABNORMAL LOW (ref >60)
Glucose, Bld: 217 mg/dL — ABNORMAL HIGH (ref 70–99)
Potassium: 7.1 mmol/L (ref 3.5–5.1)
Sodium: 131 mmol/L — ABNORMAL LOW (ref 135–145)

## 2023-07-28 LAB — CBC
HCT: 37 % (ref 36.0–46.0)
Hemoglobin: 12.7 g/dL (ref 12.0–15.0)
MCH: 32.6 pg (ref 26.0–34.0)
MCHC: 34.3 g/dL (ref 30.0–36.0)
MCV: 95.1 fL (ref 80.0–100.0)
Platelets: 378 10*3/uL (ref 150–400)
RBC: 3.89 MIL/uL (ref 3.87–5.11)
RDW: 15.7 % — ABNORMAL HIGH (ref 11.5–15.5)
WBC: 8.4 10*3/uL (ref 4.0–10.5)
nRBC: 0 % (ref 0.0–0.2)

## 2023-07-28 LAB — COMPREHENSIVE METABOLIC PANEL
ALT: 15 U/L (ref 0–44)
AST: 33 U/L (ref 15–41)
Albumin: 3.9 g/dL (ref 3.5–5.0)
Alkaline Phosphatase: 29 U/L — ABNORMAL LOW (ref 38–126)
Anion gap: 31 — ABNORMAL HIGH (ref 5–15)
BUN: 116 mg/dL — ABNORMAL HIGH (ref 8–23)
CO2: 9 mmol/L — ABNORMAL LOW (ref 22–32)
Calcium: 10.6 mg/dL — ABNORMAL HIGH (ref 8.9–10.3)
Chloride: 90 mmol/L — ABNORMAL LOW (ref 98–111)
Creatinine, Ser: 10.64 mg/dL — ABNORMAL HIGH (ref 0.44–1.00)
GFR, Estimated: 3 mL/min — ABNORMAL LOW (ref >60)
Glucose, Bld: 38 mg/dL — CL (ref 70–99)
Potassium: 7.5 mmol/L (ref 3.5–5.1)
Sodium: 130 mmol/L — ABNORMAL LOW (ref 135–145)
Total Bilirubin: 1.1 mg/dL (ref 0.0–1.2)
Total Protein: 7.3 g/dL (ref 6.5–8.1)

## 2023-07-28 LAB — MRSA NEXT GEN BY PCR, NASAL: MRSA by PCR Next Gen: NOT DETECTED

## 2023-07-28 LAB — GLUCOSE, CAPILLARY
Glucose-Capillary: 139 mg/dL — ABNORMAL HIGH (ref 70–99)
Glucose-Capillary: 209 mg/dL — ABNORMAL HIGH (ref 70–99)
Glucose-Capillary: 201 mg/dL — ABNORMAL HIGH (ref 70–99)
Glucose-Capillary: 189 mg/dL — ABNORMAL HIGH (ref 70–99)

## 2023-07-28 LAB — LIPASE, BLOOD: Lipase: 810 U/L — ABNORMAL HIGH (ref 11–51)

## 2023-07-28 LAB — BLOOD GAS, VENOUS
Acid-base deficit: 21.4 mmol/L — ABNORMAL HIGH (ref 0.0–2.0)
Bicarbonate: 5.2 mmol/L — ABNORMAL LOW (ref 20.0–28.0)
O2 Saturation: 94 %
Patient temperature: 37
pCO2, Ven: <18 mmHg — CL (ref 44–60)
pH, Ven: 7.15 — CL (ref 7.25–7.43)
pO2, Ven: 69 mmHg — ABNORMAL HIGH (ref 32–45)

## 2023-07-28 LAB — CBG MONITORING, ED
Glucose-Capillary: 21 mg/dL — CL (ref 70–99)
Glucose-Capillary: 113 mg/dL — ABNORMAL HIGH (ref 70–99)
Glucose-Capillary: 106 mg/dL — ABNORMAL HIGH (ref 70–99)

## 2023-07-28 LAB — LACTIC ACID, PLASMA: Lactic Acid, Venous: 5.4 mmol/L (ref 0.5–1.9)

## 2023-07-28 LAB — TRIGLYCERIDES: Triglycerides: 163 mg/dL — ABNORMAL HIGH (ref <150)

## 2023-07-28 MED ORDER — PRISMASOL BGK 2/3.5 32-2-3.5 MEQ/L EC SOLN: Status: DC

## 2023-07-28 MED ORDER — PANTOPRAZOLE SODIUM 40 MG IV SOLR
40.0000 mg | Freq: Two times a day (BID) | INTRAVENOUS | Status: DC
  Administered 2023-07-28 – 2023-08-02 (×10): 40 mg via INTRAVENOUS
  Filled 2023-07-28 (×10): qty 10

## 2023-07-28 MED ORDER — SODIUM BICARBONATE 8.4 % IV SOLN: 50.0000 meq | Freq: Once | INTRAVENOUS | Status: DC

## 2023-07-28 MED ORDER — SODIUM BICARBONATE 8.4 % IV SOLN
50.0000 meq | Freq: Once | INTRAVENOUS | Status: AC
  Administered 2023-07-28: 50 meq via INTRAVENOUS
  Filled 2023-07-28: qty 50

## 2023-07-28 MED ORDER — HYDROMORPHONE HCL 1 MG/ML IJ SOLN
0.2500 mg | INTRAMUSCULAR | Status: DC | PRN
  Administered 2023-07-28 – 2023-08-02 (×24): 0.5 mg via INTRAVENOUS
  Filled 2023-07-28 (×4): qty 1
  Filled 2023-07-28 (×2): qty 0.5
  Filled 2023-07-28: qty 1
  Filled 2023-07-28 (×3): qty 0.5
  Filled 2023-07-28 (×2): qty 1
  Filled 2023-07-28 (×2): qty 0.5
  Filled 2023-07-28 (×2): qty 1
  Filled 2023-07-28: qty 0.5
  Filled 2023-07-28: qty 1
  Filled 2023-07-28: qty 0.5
  Filled 2023-07-28 (×2): qty 1
  Filled 2023-07-28 (×3): qty 0.5

## 2023-07-28 MED ORDER — SODIUM BICARBONATE 8.4 % IV SOLN
INTRAVENOUS | Status: AC
  Filled 2023-07-28: qty 1000

## 2023-07-28 MED ORDER — CHLORHEXIDINE GLUCONATE CLOTH 2 % EX PADS
6.0000 | MEDICATED_PAD | Freq: Every day | CUTANEOUS | Status: DC
  Administered 2023-07-28 – 2023-08-07 (×11): 6 via TOPICAL

## 2023-07-28 MED ORDER — PIPERACILLIN-TAZOBACTAM IN DEX 2-0.25 GM/50ML IV SOLN
2.2500 g | Freq: Three times a day (TID) | INTRAVENOUS | Status: DC
  Administered 2023-07-28: 2.25 g via INTRAVENOUS
  Filled 2023-07-28 (×3): qty 50

## 2023-07-28 MED ORDER — CALCIUM GLUCONATE-NACL 2-0.675 GM/100ML-% IV SOLN
2.0000 g | Freq: Once | INTRAVENOUS | Status: DC
  Filled 2023-07-28: qty 100

## 2023-07-28 MED ORDER — DEXTROSE 50 % IV SOLN
INTRAVENOUS | Status: AC
Start: 2023-07-28 — End: 2023-07-28
  Administered 2023-07-28: 50 mL
  Filled 2023-07-28: qty 50

## 2023-07-28 MED ORDER — HYDROMORPHONE HCL 1 MG/ML IJ SOLN
0.5000 mg | Freq: Once | INTRAMUSCULAR | Status: AC
  Administered 2023-07-28: 0.5 mg via INTRAVENOUS
  Filled 2023-07-28: qty 1

## 2023-07-28 MED ORDER — SODIUM CHLORIDE 0.9% FLUSH
10.0000 mL | INTRAVENOUS | Status: DC | PRN
  Administered 2023-08-01: 10 mL

## 2023-07-28 MED ORDER — ONDANSETRON HCL 4 MG/2ML IJ SOLN
4.0000 mg | INTRAMUSCULAR | Status: AC
  Administered 2023-07-28: 4 mg via INTRAVENOUS
  Filled 2023-07-28: qty 2

## 2023-07-28 MED ORDER — HEPARIN SODIUM (PORCINE) 1000 UNIT/ML DIALYSIS
1000.0000 [IU] | INTRAMUSCULAR | Status: DC | PRN
  Administered 2023-07-28: 3000 [IU] via INTRAVENOUS_CENTRAL
  Administered 2023-07-29: 4000 [IU] via INTRAVENOUS_CENTRAL
  Filled 2023-07-28 (×2): qty 6
  Filled 2023-07-28: qty 4

## 2023-07-28 MED ORDER — SODIUM POLYSTYRENE SULFONATE 15 GM/60ML CO SUSP
60.0000 g | Status: AC
  Administered 2023-07-28: 60 g via RECTAL
  Filled 2023-07-28 (×2): qty 240

## 2023-07-28 MED ORDER — DEXTROSE 50 % IV SOLN
1.0000 | Freq: Once | INTRAVENOUS | Status: AC
Start: 2023-07-28 — End: 2023-07-28
  Administered 2023-07-28: 50 mL via INTRAVENOUS

## 2023-07-28 MED ORDER — DOCUSATE SODIUM 100 MG PO CAPS
100.0000 mg | ORAL_CAPSULE | Freq: Two times a day (BID) | ORAL | Status: DC | PRN
  Administered 2023-08-02 – 2023-08-05 (×2): 100 mg via ORAL
  Filled 2023-07-28 (×2): qty 1

## 2023-07-28 MED ORDER — SODIUM CHLORIDE 0.9 % IV BOLUS: 1000.0000 mL | Freq: Once | INTRAVENOUS | Status: DC

## 2023-07-28 MED ORDER — SODIUM CHLORIDE 0.9 % IV BOLUS
500.0000 mL | Freq: Once | INTRAVENOUS | Status: AC
  Administered 2023-07-28: 500 mL via INTRAVENOUS

## 2023-07-28 MED ORDER — HEPARIN SODIUM (PORCINE) 5000 UNIT/ML IJ SOLN
5000.0000 [IU] | Freq: Three times a day (TID) | INTRAMUSCULAR | Status: DC
  Administered 2023-07-28 – 2023-08-02 (×16): 5000 [IU] via SUBCUTANEOUS
  Filled 2023-07-28 (×15): qty 1

## 2023-07-28 MED ORDER — CALCIUM GLUCONATE-NACL 1-0.675 GM/50ML-% IV SOLN
1.0000 g | Freq: Once | INTRAVENOUS | Status: AC
  Administered 2023-07-28: 1000 mg via INTRAVENOUS
  Filled 2023-07-28: qty 50

## 2023-07-28 MED ORDER — SODIUM BICARBONATE 8.4 % IV SOLN
INTRAVENOUS | Status: DC
  Filled 2023-07-28 (×3): qty 1000

## 2023-07-28 MED ORDER — FENTANYL CITRATE PF 50 MCG/ML IJ SOSY
12.5000 ug | PREFILLED_SYRINGE | INTRAMUSCULAR | Status: DC | PRN
  Administered 2023-07-28 (×2): 25 ug via INTRAVENOUS
  Filled 2023-07-28 (×2): qty 1

## 2023-07-28 MED ORDER — SODIUM CHLORIDE 0.9% FLUSH
10.0000 mL | Freq: Two times a day (BID) | INTRAVENOUS | Status: DC
  Administered 2023-07-28 – 2023-08-01 (×7): 10 mL
  Administered 2023-08-01: 30 mL
  Administered 2023-08-02 – 2023-08-06 (×10): 10 mL

## 2023-07-28 MED ORDER — POLYETHYLENE GLYCOL 3350 17 G PO PACK
17.0000 g | PACK | Freq: Every day | ORAL | Status: DC | PRN
  Administered 2023-08-02: 17 g via ORAL

## 2023-07-28 MED ORDER — DEXTROSE 50 % IV SOLN
1.0000 | Freq: Once | INTRAVENOUS | Status: DC
  Filled 2023-07-28: qty 50

## 2023-07-28 MED ORDER — INSULIN ASPART 100 UNIT/ML IV SOLN
5.0000 [IU] | Freq: Once | INTRAVENOUS | Status: AC
  Administered 2023-07-28: 5 [IU] via INTRAVENOUS
  Filled 2023-07-28: qty 0.05

## 2023-07-28 MED ORDER — SODIUM ZIRCONIUM CYCLOSILICATE 10 G PO PACK: 10.0000 g | PACK | Freq: Once | ORAL | Status: DC

## 2023-07-28 MED ORDER — ALBUTEROL SULFATE (2.5 MG/3ML) 0.083% IN NEBU
2.5000 mg | INHALATION_SOLUTION | Freq: Once | RESPIRATORY_TRACT | Status: AC
  Administered 2023-07-28: 2.5 mg via RESPIRATORY_TRACT
  Filled 2023-07-28: qty 3

## 2023-07-28 MED ORDER — ONDANSETRON HCL 4 MG/2ML IJ SOLN
4.0000 mg | Freq: Four times a day (QID) | INTRAMUSCULAR | Status: DC | PRN
  Administered 2023-07-30 (×2): 4 mg via INTRAVENOUS
  Filled 2023-07-28 (×2): qty 2

## 2023-07-28 MED ORDER — INSULIN ASPART 100 UNIT/ML IV SOLN
10.0000 [IU] | Freq: Once | INTRAVENOUS | Status: DC
  Filled 2023-07-28: qty 0.1

## 2023-07-28 MED ORDER — PIPERACILLIN-TAZOBACTAM 3.375 G IVPB
3.3750 g | Freq: Four times a day (QID) | INTRAVENOUS | Status: DC
  Administered 2023-07-28 – 2023-07-30 (×7): 3.375 g via INTRAVENOUS
  Filled 2023-07-28 (×7): qty 50

## 2023-07-28 MED ORDER — SODIUM BICARBONATE 8.4 % IV SOLN
100.0000 meq | Freq: Once | INTRAVENOUS | Status: AC
  Administered 2023-07-28: 100 meq via INTRAVENOUS
  Filled 2023-07-28: qty 50

## 2023-07-28 MED ORDER — DEXTROSE-SODIUM CHLORIDE 10-0.45 % IV SOLN
INTRAVENOUS | Status: DC
  Filled 2023-07-28 (×3): qty 1000

## 2023-07-28 MED ORDER — ORAL CARE MOUTH RINSE: 15.0000 mL | OROMUCOSAL | Status: DC | PRN

## 2023-07-28 NOTE — Procedures (Signed)
 Central Venous Catheter Insertion Procedure Note  Katherine [name]  [id number]  [birth year]  Date:07/28/23  Time:3:22 PM   Provider Performing:Arilynn Blakeney D Elvina Sidle   Procedure: Insertion of Non-tunneled Central Venous Catheter(36556)with US guidance (08657)    Indication(s) Medication administration, Difficult access, and Hemodialysis  Consent Risks of the procedure as well as the alternatives and risks of each were explained to the patient and/or caregiver.  Consent for the procedure was obtained and is signed in the bedside chart  Anesthesia Topical only with 1% lidocaine   Timeout Verified patient identification, verified procedure, site/side was marked, verified correct patient position, special equipment/implants available, medications/allergies/relevant history reviewed, required imaging and test results available.  Sterile Technique Maximal sterile technique including full sterile barrier drape, hand hygiene, sterile gown, sterile gloves, mask, hair covering, sterile ultrasound probe cover (if used).  Procedure Description Area of catheter insertion was cleaned with chlorhexidine and draped in sterile fashion.   With real-time ultrasound guidance a HD catheter was placed into the right femoral vein.  Nonpulsatile blood flow and easy flushing noted in all ports.  The catheter was sutured in place and sterile dressing applied.  Complications/Tolerance None; patient tolerated the procedure well. Chest X-ray is ordered to verify placement for internal jugular or subclavian cannulation.  Chest x-ray is not ordered for femoral cannulation.  EBL Minimal  Specimen(s) None    Line inserted to the 20 cm mark. BIOPATCH applied to the insertion site.   Harlon Ditty, AGACNP-BC [facility] Prefer epic messenger for cross cover needs If after hours, please call E-link

## 2023-07-28 NOTE — ED Provider Notes (Addendum)
 [facility] Provider Note    Event Date/Time   First MD Initiated Contact with Patient 07/28/23 1130     (approximate)   History   Emesis   HPI  Katherine [name] is a 78 y.o. female with a history of diabetes controlled with oral medications and Ozempic  Patient reports that starting on roughly a week and 1/2 to 2 weeks ago began having nausea vomiting loose stools and intermittent abdominal pain.  This persisted now.  She has had increasing weakness feels dehydrated fatigue.  Try to check her blood sugar at home but was unable believing her glucometer was reading wrong he was showing a very low reading.  EMS responded to her home, she was given juice, oral glucose solution and something to eat.  She did not wish to be transported by ambulance her husband brought her here  She has been using Ozempic now for 4 months, it has caused her nausea and vomiting prior, but she has been on it now for several months.  She denies any fevers or chills no pain or burning with urination no chest pain or shortness of breath no headaches.  She reports her abdomen feels sore throughout she feels nauseated.       Physical Exam   Triage Vital Signs: ED Triage Vitals  Encounter Vitals Group     BP 07/28/23 1103 (!) 125/54     Systolic BP Percentile --      Diastolic BP Percentile --      Pulse Rate 07/28/23 1103 (!) 53     Resp 07/28/23 1103 16     Temp --      Temp src --      SpO2 07/28/23 1103 100 %     Weight 07/28/23 1112 180 lb (81.6 kg)     Height 07/28/23 1112 5\' 7"  (1.702 m)     Head Circumference --      Peak Flow --      Pain Score 07/28/23 1111 8     Pain Loc --      Pain Education --      Exclude from Growth Chart --     Most recent vital signs: Vitals:   07/28/23 1338 07/28/23 1400  BP:  (!) 116/46  Pulse:  (!) 45  Resp:  15  Temp: (!) 90.7 F (32.6 C)   SpO2:  100%  Rectal temp ordered (patient in hallway)   General: Awake, no  distress.  Normocephalic atraumatic CV:  Good peripheral perfusion.  Normal tones and rate Resp:  Normal effort.  Abd:  No distention.  Soft mildly tender throughout with no obvious focal discomfort no rebound or guarding. Other:  Slightly cool to touch but well-perfused.  Mucous membranes are dry     ED Results / Procedures / Treatments   Labs (all labs ordered are listed, but only abnormal results are displayed) Labs Reviewed  LIPASE, BLOOD - Abnormal; Notable for the following components:      Result Value   Lipase 810 (*)    All other components within normal limits  COMPREHENSIVE METABOLIC PANEL - Abnormal; Notable for the following components:   Sodium 130 (*)    Potassium 7.5 (*)    Chloride 90 (*)    CO2 9 (*)    Glucose, Bld 38 (*)    BUN 116 (*)    Creatinine, Ser 10.64 (*)    Calcium 10.6 (*)    Alkaline Phosphatase 29 (*)  GFR, Estimated 3 (*)    Anion gap 31 (*)    All other components within normal limits  CBC - Abnormal; Notable for the following components:   RDW 15.7 (*)    All other components within normal limits  BLOOD GAS, VENOUS - Abnormal; Notable for the following components:   pH, Ven 7.15 (*)    pCO2, Ven <18 (*)    pO2, Ven 69 (*)    Bicarbonate 5.2 (*)    Acid-base deficit 21.4 (*)    All other components within normal limits  LACTIC ACID, PLASMA - Abnormal; Notable for the following components:   Lactic Acid, Venous 5.4 (*)    All other components within normal limits  TRIGLYCERIDES - Abnormal; Notable for the following components:   Triglycerides 163 (*)    All other components within normal limits  GLUCOSE, CAPILLARY - Abnormal; Notable for the following components:   Glucose-Capillary 139 (*)    All other components within normal limits  CBG MONITORING, ED - Abnormal; Notable for the following components:   Glucose-Capillary 21 (*)    All other components within normal limits  CBG MONITORING, ED - Abnormal; Notable for the  following components:   Glucose-Capillary 113 (*)    All other components within normal limits  CBG MONITORING, ED - Abnormal; Notable for the following components:   Glucose-Capillary 106 (*)    All other components within normal limits  GASTROINTESTINAL PANEL BY PCR, STOOL (REPLACES STOOL CULTURE)  CULTURE, BLOOD (ROUTINE X 2)  CULTURE, BLOOD (ROUTINE X 2)  C DIFFICILE QUICK SCREEN W PCR REFLEX    MRSA NEXT GEN BY PCR, NASAL  URINALYSIS, ROUTINE W REFLEX MICROSCOPIC  MISC [facility] TEST (SEND OUT)  MISC [facility] TEST (SEND OUT)  BASIC METABOLIC PANEL  THYROID PANEL WITH TSH  CORTISOL  URINE DRUG SCREEN, QUALITATIVE ([facility] ONLY)  CBG MONITORING, ED  CBG MONITORING, ED  CBG MONITORING, ED  CBG MONITORING, ED  CBG MONITORING, ED  CBG MONITORING, ED  CBG MONITORING, ED  CBG MONITORING, ED  CBG MONITORING, ED  CBG MONITORING, ED  CBG MONITORING, ED  CBG MONITORING, ED  CBG MONITORING, ED  CBG MONITORING, ED  CBG MONITORING, ED  TROPONIN I (HIGH SENSITIVITY)  TROPONIN I (HIGH SENSITIVITY)   Labs interpreted as normal CBC  Severe hypoglycemia on presentation, corrected to 113 after receiving additional juices and 1 amp of D50  Significant elevated lactic acid in the setting of acute renal failure.  Elevated pancreatic enzyme including lipase with associated abdominal pain is suspicious for acute intra-abdominal process or pancreatitis.  Severe renal failure with severe hyperkalemia.  EKG  Inter by me at 1240 heart rate 50 QRS 130 QTc 520 Sinus bradycardia.  Left bundle branch block.  Suspected there are very low amplitude somewhat dampened P waves notable in lead II.  I do not see evidence of complete heart block   RADIOLOGY CT ABDOMEN PELVIS WO CONTRAST Result Date: 07/28/2023 CLINICAL DATA:  Vomiting for 1 week. EXAM: CT ABDOMEN AND PELVIS WITHOUT CONTRAST TECHNIQUE: Multidetector CT imaging of the abdomen and pelvis was performed following the standard protocol without IV  contrast. RADIATION DOSE REDUCTION: This exam was performed according to the departmental dose-optimization program which includes automated exposure control, adjustment of the mA and/or kV according to patient size and/or use of iterative reconstruction technique. COMPARISON:  April 30, 2021. FINDINGS: Lower chest: No acute abnormality. Hepatobiliary: No focal liver abnormality is seen. Status post cholecystectomy. No biliary dilatation.  Pancreas: Some degree of fatty replacement of the pancreas is noted. Mild inflammatory changes are noted around the pancreas. No definite pseudocyst formation is noted. Spleen: Normal in size without focal abnormality. Adrenals/Urinary Tract: Adrenal glands are unremarkable. Kidneys are normal, without renal calculi, focal lesion, or hydronephrosis. Bladder is unremarkable. Stomach/Bowel: Stomach is unremarkable. The appendix appears normal. There is no evidence of bowel obstruction. Colon is unremarkable. However, there is interval development of moderate wall thickening of the duodenum and proximal jejunum with surrounding inflammatory changes. It is uncertain if this represents primary inflammation of the bowel or peptic ulcer disease, or if this is secondary to pancreatitis. Vascular/Lymphatic: Aortic atherosclerosis. No enlarged abdominal or pelvic lymph nodes. Reproductive: Uterus and bilateral adnexa are unremarkable. Other: No ascites or hernia is noted. Musculoskeletal: Stable old L2 compression fracture. No acute osseous abnormality is noted. IMPRESSION: Moderate wall thickening of the duodenum and proximal jejunum is noted with surrounding inflammatory changes. This is concerning for severe enteritis or possibly peptic ulcer disease although the possibility of this being secondary to some degree of pancreatitis cannot be excluded. Clinical correlation is recommended. Aortic Atherosclerosis (ICD10-I70.0). Electronically Signed   By: Lupita Raider M.D.   On:  07/28/2023 15:18      PROCEDURES:  Critical Care performed: Yes, see critical care procedure note(s)  CRITICAL CARE Performed by: Sharyn Creamer   Total critical care time: 35 minutes  Critical care time was exclusive of separately billable procedures and treating other patients.  Critical care was necessary to treat or prevent imminent or life-threatening deterioration.  Critical care was time spent personally by me on the following activities: development of treatment plan with patient and/or surrogate as well as nursing, discussions with consultants, evaluation of patient's response to treatment, examination of patient, obtaining history from patient or surrogate, ordering and performing treatments and interventions, ordering and review of laboratory studies, ordering and review of radiographic studies, pulse oximetry and re-evaluation of patient's condition.  Procedures   MEDICATIONS ORDERED IN ED: Medications  dextrose 10 % and 0.45 % NaCl infusion ( Intravenous Infusion Verify 07/28/23 1459)  sodium polystyrene (KAYEXALATE) 15 GM/60ML suspension 60 g (has no administration in time range)  sodium bicarbonate 150 mEq in dextrose 5 % 1,150 mL infusion ( Intravenous New Bag/Given 07/28/23 1552)  docusate sodium (COLACE) capsule 100 mg (has no administration in time range)  polyethylene glycol (MIRALAX / GLYCOLAX) packet 17 g (has no administration in time range)  heparin injection 5,000 Units (5,000 Units Subcutaneous Given 07/28/23 1600)  ondansetron (ZOFRAN) injection 4 mg (has no administration in time range)  piperacillin-tazobactam (ZOSYN) IVPB 2.25 g (has no administration in time range)  fentaNYL (SUBLIMAZE) injection 12.5-25 mcg (25 mcg Intravenous Given 07/28/23 1449)  dextrose 50 % solution (50 mLs  Given 07/28/23 1133)  ondansetron (ZOFRAN) injection 4 mg (4 mg Intravenous Given 07/28/23 1228)  sodium chloride 0.9 % bolus 500 mL (500 mLs Intravenous New Bag/Given 07/28/23 1226)   sodium bicarbonate injection 50 mEq (50 mEq Intravenous Given 07/28/23 1326)  albuterol (PROVENTIL) (2.5 MG/3ML) 0.083% nebulizer solution 2.5 mg (2.5 mg Nebulization Given 07/28/23 1328)  calcium gluconate 1 g/ 50 mL sodium chloride IVPB (0 mg Intravenous Stopped 07/28/23 1459)  sodium bicarbonate injection 50 mEq (50 mEq Intravenous Given 07/28/23 1504)     IMPRESSION / MDM / ASSESSMENT AND PLAN / ED COURSE  I reviewed the triage vital signs and the nursing notes.  Differential diagnosis includes, but is not limited to, dehydration nausea vomiting colitis gastroenteritis, pancreatitis cholelithiasis cholecystitis, etc.  Patient appears to have acute intra-abdominal symptoms primarily associated abdominal pain but also appears dehydrated slightly cool to touch.  She is alert and fully oriented  The patient is noted to have a lactate>4. With the current information available to me, I don't think the patient is in septic shock. The lactate>4, is related to acute renal failure with acidosis.  Hypothermia noted on check of temp, ICU team is already seen and applying Bair hugger  Patient's presentation is most consistent with acute presentation with potential threat to life or bodily function.   The patient is on the cardiac monitor to evaluate for evidence of arrhythmia and/or significant heart rate changes.  ----------------------------------------- 4:00 PM on 07/28/2023 ----------------------------------------- Appreciate consult by nephrology (Dr. Nunzio Cobbs), patient admitted to critical care medicine.  Patient with multiple electrolyte abnormalities acute renal failure severe hyperkalemia which are treating hypoglycemia etc.  Based on her imaging as well as her labs there is certainly high concern for acute intra-abdominal process but no evidence of clear perforation.  ICU team continue to follow and nephrology consulting.  It appears that the ICU team has ordered  broad-spectrum antibiotics and to cover in case of the event of sepsis at this time well as well which I am agreeable with.  My initial impression was that lactic acid likely elevated in the setting of acute renal failure and poor clearance with dehydration, however it is definitely resonable to administer broad spectrum antibiotics at this time. Patient is in acute renal failure, cautious use of large volume fluid bolusing pending nephrology final recommendations.  Patient and her husband both very understanding agreeable with plan for admission       FINAL CLINICAL IMPRESSION(S) / ED DIAGNOSES   Final diagnoses:  AKI (acute kidney injury) (HCC)  Lactic acid acidosis  Hyperkalemia     Rx / DC Orders   ED Discharge Orders     None        Note:  This document was prepared using Dragon voice recognition software and may include unintentional dictation errors.   Sharyn Creamer, MD 07/28/23 1604    Sharyn Creamer, MD 07/28/23 [phone]

## 2023-07-28 NOTE — Progress Notes (Signed)
 Pharmacy Antibiotic Note  Katherine [name] is a 78 y.o. female admitted on 07/28/2023 with  intra-abdominal infection .  Pharmacy has been consulted for Zosyn dosing.  Plan: Start Zosyn 2.25 g IV Q8H (dose appropriate for current renal function and if she goes on dialysis)  Continue to monitor renal function and follow culture results   Height: 5\' 7"  (170.2 cm) Weight: 81.6 kg (180 lb) IBW/kg (Calculated) : 61.6  No data recorded.  Recent Labs  Lab 07/28/23 1120  WBC 8.4  CREATININE 10.64*    Estimated Creatinine Clearance: 5 mL/min (A) (by C-G formula based on SCr of 10.64 mg/dL (H)).    Allergies  Allergen Reactions   Lipitor [Atorvastatin]     Severe weakness, joint pain   Pravastatin     Severe weakness, joint pain   Codeine    Morphine And Codeine    Vytorin [Ezetimibe-Simvastatin]     Antimicrobials this admission: 3/18 Zosyn >>   Microbiology results: None  Thank you for allowing pharmacy to be a part of this patient's care.  Merryl Hacker, PharmD Clinical Pharmacist  07/28/2023 1:31 PM

## 2023-07-28 NOTE — Plan of Care (Signed)
  Problem: Education: Goal: Knowledge of General Education information will improve Description Including pain rating scale, medication(s)/side effects and non-pharmacologic comfort measures Outcome: Progressing   Problem: Health Behavior/Discharge Planning: Goal: Ability to manage health-related needs will improve Outcome: Progressing   Problem: Clinical Measurements: Goal: Ability to maintain clinical measurements within normal limits will improve Outcome: Not Progressing Goal: Will remain free from infection Outcome: Not Progressing Goal: Diagnostic test results will improve Outcome: Not Progressing Goal: Respiratory complications will improve Outcome: Progressing Goal: Cardiovascular complication will be avoided Outcome: Progressing

## 2023-07-28 NOTE — ED Notes (Signed)
 Pt to CT

## 2023-07-28 NOTE — ED Triage Notes (Signed)
 Patient to ED via POV from home for vomiting. Ongoing x1 week. Has not been eating much due to same. States EMS was at the house and they checked her cbg but there glucometer was not working right but gave her oral glucose. Has been taking ozempic x3 months.

## 2023-07-28 NOTE — Consult Note (Signed)
 [facility] CONSULT NOTE    Date: 07/28/2023                  Patient Name:  Katherine [name]  MRN: [id number]  DOB: 1945-08-03  Age / Sex: 78 y.o., female         PCP: Dorothey Baseman, MD                 Service Requesting Consult: Emergency department                 Reason for Consult: Severe acute kidney injury, hyperkalemia, acute metabolic acidosis            History of Present Illness: Patient is a 78 y.o. female with a PMHx of hypothyroidism, diabetes mellitus type 2 treated with Ozempic and metformin, hypertension, hyperlipidemia, atrial fibrillation, basal cell carcinoma, cystocele, depression, glaucoma, urinary incontinence who was admitted to [facility] on 07/28/2023 for evaluation of nausea, vomiting, loose stools, and abdominal pain.  The symptoms apparently began 1.5 to 2 weeks ago.  Upon arrival here patient found to have severe metabolic derangements.  Serum potassium was very high at 7.5 with serum bicarbonate of 9, BUN 116, creatinine 10.6, calcium 10.6.  Anion gap was also quite elevated at 31.  ABG showed pH of 7.15 with pCO2 less than 18 and pO2 of 69.  Lactic acid also quite high at 5.4.  As above she is known to be on Ozempic and metformin.  Given the severity of her issues she was immediately taken to the intensive care unit and we have recommended initiation of CRRT.   Medications: Outpatient medications: Medications Prior to Admission  Medication Sig Dispense Refill Last Dose/Taking   amLODipine (NORVASC) 5 MG tablet Take 1 tablet (5 mg total) by mouth at bedtime. 30 tablet 0    cholecalciferol (VITAMIN D) 1000 units tablet Take 1,000 Units by mouth daily.      Colesevelam HCl 3.75 g PACK Take 3.75 g by mouth.      cyclobenzaprine (FLEXERIL) 5 MG tablet Take 1 tablet (5 mg total) by mouth 3 (three) times daily as needed for muscle spasms. 30 tablet 0    diazepam (VALIUM) 5 MG tablet Take 2.5-5 mg by mouth every 8 (eight) hours as needed for  anxiety.       diclofenac Sodium (VOLTAREN) 1 % GEL Apply 2 g topically 4 (four) times daily.      ferrous sulfate 325 (65 FE) MG tablet Take 325 mg by mouth daily with breakfast.      furosemide (LASIX) 40 MG tablet Take 40 mg by mouth daily.      glimepiride (AMARYL) 2 MG tablet Take 2 mg by mouth 2 (two) times daily.      lidocaine (LIDODERM) 5 % Place 1 patch onto the skin daily. Remove & Discard patch within 12 hours or as directed by MD 30 patch 0    liothyronine (CYTOMEL) 50 MCG tablet Take 50 mcg by mouth every morning.      lisinopril (ZESTRIL) 20 MG tablet Take 20 mg by mouth daily.      metFORMIN (GLUCOPHAGE) 1000 MG tablet Take 1,000 mg by mouth 2 (two) times daily with a meal.       metoprolol succinate (TOPROL-XL) 25 MG 24 hr tablet Take 25 mg by mouth daily.      Multiple Vitamin (MULTI-VITAMINS) TABS Take 1 tablet by mouth daily.       oxyCODONE (OXY IR/ROXICODONE) 5  MG immediate release tablet Take 1 tablet (5 mg total) by mouth every 6 (six) hours as needed for moderate pain or breakthrough pain. 20 tablet 0    pantoprazole (PROTONIX) 40 MG tablet Take 1 tablet (40 mg total) by mouth 2 (two) times daily. 30 tablet 2    pioglitazone (ACTOS) 30 MG tablet Take 30 mg by mouth daily.      rivaroxaban (XARELTO) 20 MG TABS tablet Take 20 mg by mouth daily with supper.      sitaGLIPtin (JANUVIA) 100 MG tablet Take 100 mg by mouth daily.      SYNTHROID 150 MCG tablet Take 150 mcg by mouth daily.  11    traMADol (ULTRAM) 50 MG tablet Take 50 mg by mouth every 6 (six) hours as needed.       Current medications: Current Facility-Administered Medications  Medication Dose Route Frequency Provider Last Rate Last Admin   dextrose 10 % and 0.45 % NaCl infusion   Intravenous Continuous Sharyn Creamer, MD 125 mL/hr at 07/28/23 1459 Infusion Verify at 07/28/23 1459   docusate sodium (COLACE) capsule 100 mg  100 mg Oral BID PRN Judithe Modest, NP       fentaNYL (SUBLIMAZE) injection 12.5-25 mcg   12.5-25 mcg Intravenous Q2H PRN Harlon Ditty D, NP   25 mcg at 07/28/23 1449   heparin injection 5,000 Units  5,000 Units Subcutaneous Q8H Harlon Ditty D, NP   5,000 Units at 07/28/23 1600   ondansetron (ZOFRAN) injection 4 mg  4 mg Intravenous Q6H PRN Judithe Modest, NP       piperacillin-tazobactam (ZOSYN) IVPB 2.25 g  2.25 g Intravenous Q8H Hunt, Madison H, RPH       polyethylene glycol (MIRALAX / GLYCOLAX) packet 17 g  17 g Oral Daily PRN Harlon Ditty D, NP       sodium bicarbonate 150 mEq in dextrose 5 % 1,150 mL infusion   Intravenous Continuous Harlon Ditty D, NP 150 mL/hr at 07/28/23 1552 New Bag at 07/28/23 1552      Allergies: Allergies  Allergen Reactions   Lipitor [Atorvastatin]     Severe weakness, joint pain   Pravastatin     Severe weakness, joint pain   Codeine    Morphine And Codeine    Vytorin [Ezetimibe-Simvastatin]       Past Medical History: Past Medical History:  Diagnosis Date   Anemia    Cancer (HCC)    Cystocele    Depression    Diabetes mellitus without complication (HCC)    Glaucoma    Hyperlipidemia    Hypertension    Hypothyroidism      Past Surgical History: Past Surgical History:  Procedure Laterality Date   BREAST BIOPSY Right 12/23/2021   rt br stereo,x clip,  BENIGN BREAST PARENCHYMA WITH FOCAL FIBROADENOMATOID CHANGE   BREAST EXCISIONAL BIOPSY Left 07/2012   CHOLECYSTECTOMY     COLONOSCOPY WITH PROPOFOL N/A 08/17/2015   Procedure: COLONOSCOPY WITH PROPOFOL;  Surgeon: Elnita Maxwell, MD;  Location: [facility] ENDOSCOPY;  Service: Endoscopy;  Laterality: N/A;   COLONOSCOPY WITH PROPOFOL N/A 07/09/2021   Procedure: COLONOSCOPY WITH PROPOFOL;  Surgeon: Regis Bill, MD;  Location: [facility] ENDOSCOPY;  Service: Endoscopy;  Laterality: N/A;   ESOPHAGOGASTRODUODENOSCOPY N/A 02/25/2019   Procedure: ESOPHAGOGASTRODUODENOSCOPY (EGD);  Surgeon: Pasty Spillers, MD;  Location[facility] ENDOSCOPY;  Service: Endoscopy;   Laterality: N/A;   ESOPHAGOGASTRODUODENOSCOPY N/A 05/02/2021   Procedure: ESOPHAGOGASTRODUODENOSCOPY (EGD);  Surgeon: Midge Minium, MD;  Location: [facility]  ENDOSCOPY;  Service: Endoscopy;  Laterality: N/A;   ESOPHAGOGASTRODUODENOSCOPY (EGD) WITH PROPOFOL N/A 04/13/2019   Procedure: ESOPHAGOGASTRODUODENOSCOPY (EGD) WITH PROPOFOL;  Surgeon: Pasty Spillers, MD;  Location: [facility] ENDOSCOPY;  Service: Endoscopy;  Laterality: N/A;     Family History: Family History  Problem Relation Age of Onset   Diabetes Mother    Diabetes Father    Diabetes Brother    Breast cancer Neg Hx      Social History: Social History   Socioeconomic History   Marital status: Married    Spouse name: Not on file   Number of children: Not on file   Years of education: Not on file   Highest education level: Not on file  Occupational History   Not on file  Tobacco Use   Smoking status: Former    Current packs/day: 0.00    Average packs/day: 1 pack/day for 8.0 years (8.0 ttl pk-yrs)    Types: Cigarettes    Start date: 05/12/1968    Quit date: 05/12/1976    Years since quitting: 47.2   Smokeless tobacco: Never  Vaping Use   Vaping status: Never Used  Substance and Sexual Activity   Alcohol use: Yes    Alcohol/week: 0.0 - 2.0 standard drinks of alcohol    Comment: occasional, not every week   Drug use: No   Sexual activity: Not on file  Other Topics Concern   Not on file  Social History Narrative   Not on file   Social Drivers of Health   Financial Resource Strain: Not on file  Food Insecurity: Not on file  Transportation Needs: Not on file  Physical Activity: Not on file  Stress: Not on file  Social Connections: Not on file  Intimate Partner Violence: Not on file     Review of Systems: Review of Systems  Constitutional:  Positive for malaise/fatigue. Negative for chills and fever.  HENT:  Negative for congestion, hearing loss and tinnitus.   Eyes:  Negative for blurred vision and double  vision.  Respiratory:  Negative for cough, sputum production and shortness of breath.   Cardiovascular:  Negative for chest pain, palpitations and orthopnea.  Gastrointestinal:  Positive for diarrhea, nausea and vomiting.  Genitourinary:  Negative for dysuria, frequency and urgency.  Musculoskeletal:  Negative for myalgias.  Skin:  Negative for itching and rash.  Neurological:  Positive for weakness. Negative for dizziness and focal weakness.  Endo/Heme/Allergies:  Negative for polydipsia. Does not bruise/bleed easily.  Psychiatric/Behavioral:  The patient is not nervous/anxious.      Vital Signs: Blood pressure (!) 116/46, pulse (!) 45, temperature (!) 90.7 F (32.6 C), temperature source Rectal, resp. rate 15, height 5\' 7"  (1.702 m), weight 81.6 kg, SpO2 100%.  Weight trends: Filed Weights   07/28/23 1112  Weight: 81.6 kg     Physical Exam: General: No acute distress  Head: Normocephalic, atraumatic. Moist oral mucosal membranes  Eyes: Anicteric  Neck: Supple  Lungs:  Clear to auscultation, normal effort  Heart: S1S2 no rubs  Abdomen:  Soft, nontender, bowel sounds present  Extremities: No peripheral edema.  Neurologic: Awake, alert, following commands  Skin: No acute rash  Access: Pending placement    Lab results: Basic Metabolic Panel: Recent Labs  Lab 07/28/23 1120  NA 130*  K 7.5*  CL 90*  CO2 9*  GLUCOSE 38*  BUN 116*  CREATININE 10.64*  CALCIUM 10.6*    Liver Function Tests: Recent Labs  Lab 07/28/23 1120  AST 33  ALT 15  ALKPHOS 29*  BILITOT 1.1  PROT 7.3  ALBUMIN 3.9   Recent Labs  Lab 07/28/23 1120  LIPASE 810*   No results for input(s): "AMMONIA" in the last 168 hours.  CBC: Recent Labs  Lab 07/28/23 1120  WBC 8.4  HGB 12.7  HCT 37.0  MCV 95.1  PLT 378    Cardiac Enzymes: No results for input(s): "CKTOTAL", "CKMB", "CKMBINDEX", "TROPONINI" in the last 168 hours.  BNP: Invalid input(s): "POCBNP"  CBG: Recent Labs   Lab 07/28/23 1113 07/28/23 1142 07/28/23 1300 07/28/23 1427  GLUCAP 21* 113* 106* 139*    Microbiology: Results for orders placed or performed during the hospital encounter of 07/28/23  MRSA Next Gen by PCR, Nasal     Status: None   Collection Time: 07/28/23  2:30 PM   Specimen: Nasal Mucosa; Nasal Swab  Result Value Ref Range Status   MRSA by PCR Next Gen NOT DETECTED NOT DETECTED Final    Comment: (NOTE) The GeneXpert MRSA Assay (FDA approved for NASAL specimens only), is one component of a comprehensive MRSA colonization surveillance program. It is not intended to diagnose MRSA infection nor to guide or monitor treatment for MRSA infections. Test performance is not FDA approved in patients less than 43 years old. Performed at [facility], [address] [place], [place] [address]     Coagulation Studies: No results for input(s): "LABPROT", "INR" in the last 72 hours.  Urinalysis: No results for input(s): "COLORURINE", "LABSPEC", "PHURINE", "GLUCOSEU", "HGBUR", "BILIRUBINUR", "KETONESUR", "PROTEINUR", "UROBILINOGEN", "NITRITE", "LEUKOCYTESUR" in the last 72 hours.  Invalid input(s): "APPERANCEUR"    Imaging: CT ABDOMEN PELVIS WO CONTRAST Result Date: 07/28/2023 CLINICAL DATA:  Vomiting for 1 week. EXAM: CT ABDOMEN AND PELVIS WITHOUT CONTRAST TECHNIQUE: Multidetector CT imaging of the abdomen and pelvis was performed following the standard protocol without IV contrast. RADIATION DOSE REDUCTION: This exam was performed according to the departmental dose-optimization program which includes automated exposure control, adjustment of the mA and/or kV according to patient size and/or use of iterative reconstruction technique. COMPARISON:  April 30, 2021. FINDINGS: Lower chest: No acute abnormality. Hepatobiliary: No focal liver abnormality is seen. Status post cholecystectomy. No biliary dilatation. Pancreas: Some degree of fatty replacement of the pancreas is noted.  Mild inflammatory changes are noted around the pancreas. No definite pseudocyst formation is noted. Spleen: Normal in size without focal abnormality. Adrenals/Urinary Tract: Adrenal glands are unremarkable. Kidneys are normal, without renal calculi, focal lesion, or hydronephrosis. Bladder is unremarkable. Stomach/Bowel: Stomach is unremarkable. The appendix appears normal. There is no evidence of bowel obstruction. Colon is unremarkable. However, there is interval development of moderate wall thickening of the duodenum and proximal jejunum with surrounding inflammatory changes. It is uncertain if this represents primary inflammation of the bowel or peptic ulcer disease, or if this is secondary to pancreatitis. Vascular/Lymphatic: Aortic atherosclerosis. No enlarged abdominal or pelvic lymph nodes. Reproductive: Uterus and bilateral adnexa are unremarkable. Other: No ascites or hernia is noted. Musculoskeletal: Stable old L2 compression fracture. No acute osseous abnormality is noted. IMPRESSION: Moderate wall thickening of the duodenum and proximal jejunum is noted with surrounding inflammatory changes. This is concerning for severe enteritis or possibly peptic ulcer disease although the possibility of this being secondary to some degree of pancreatitis cannot be excluded. Clinical correlation is recommended. Aortic Atherosclerosis (ICD10-I70.0). Electronically Signed   By: Lupita Raider M.D.   On: 07/28/2023 15:18     Assessment & Plan: Pt is a 77  y.o. female with a PMHx of hypothyroidism, diabetes mellitus type 2 treated with Ozempic and metformin, hypertension, hyperlipidemia, atrial fibrillation, basal cell carcinoma, cystocele, depression, glaucoma, urinary incontinence who was admitted to [facility] on 07/28/2023 for evaluation of nausea, vomiting, loose stools, and abdominal pain.  1.  Acute kidney injury/chronic kidney disease stage IIIb baseline EGFR 31/diabetes mellitus type 2 with chronic kidney  disease/hyperkalemia.  Patient now with severe acute kidney injury.  Suspect development of ATN from prolonged nausea and vomiting.  Given severity of acute kidney injury and hyperkalemia as well as acute metabolic acidosis recommend initiation of renal placement therapy.  For stability recommend CRRT.  Appreciate line placement by critical care team.  We will proceed with CRRT using 2K bath and keep fluid balance goal of net even for now.  2.  Acute metabolic acidosis.  Ozempic and metformin likely playing some role here.  Both of these have been discontinued.  CRRT should help to correct the underlying metabolic acidosis.  3.  Pancreatitis.  Lipase noted to be 810.  CT scan abdomen pelvis reviewed.  Would trend the lipase.  4.  Further plan as patient progresses.  Thanks for consultation.

## 2023-07-28 NOTE — H&P (Signed)
 NAME:  Katherine [name], MRN:  [id number], DOB:  21-Apr-1946, LOS: 0 ADMISSION DATE:  07/28/2023, CONSULTATION DATE:  07/28/23 REFERRING MD:  Dr. Fanny Bien, CHIEF COMPLAINT:  Abdominal pain, Nausea, vomiting, diarrhea   Brief Pt Description / Synopsis:  78 y.o. female admitted with Acute Pancreatitis vs. Gastroenteritis, along with Acute Kidney Injury, Hyperkalemia, and severe Anion Gap Metabolic Acidosis requiring initiation of CRRT.  History of Present Illness:  Katherine [name] is a 78 year old female with a past medical history significant for diabetes mellitus on metformin and Ozempic, hypertension, hyperlipidemia, anemia, cancer who presents to [facility] ED 07/28/2023 due to complaints of abdominal pain, nausea, vomiting, diarrhea.  Patient reports that approximately 1 week ago she began having nausea, vomiting, loose stools, and intermittent abdominal pain.  This has persisted and progressively worsened.  She also reports associated weakness, fatigue, and poor p.o. intake.  This morning she she tried to take her glucose reading on her glucometer, however was showing very low reading.  She denies fevers, chills, chest pain, shortness of breath, palpitations, dysuria, headaches.  She does report she has been on Ozempic now for 4 months and it has caused nausea and vomiting prior.  ED Course: Initial Vital Signs: Temperature F, HR 53, RR 16, BP 125/54, SpO2 100% on room air Significant Labs: Sodium 130, potassium 7.5, bicarb 9, glucose 38, BUN 116, creatinine 10.6, calcium 10.6, anion gap 31, lipase 810, high-sensitivity troponin 17, lactic acid 5.4 VBG: pH 7.15/pCO2 less than 18/pO2 69/bicarb 5.2 Urinalysis and urine drug screen currently pending Imaging CT abdomen & pelvis>>IMPRESSION: Moderate wall thickening of the duodenum and proximal jejunum is noted with surrounding inflammatory changes. This is concerning for severe enteritis or possibly peptic ulcer disease although the possibility of this  being secondary to some degree of pancreatitis cannot be excluded. Clinical correlation is recommended. Medications Administered: Calcium gluconate, 2 A of bicarb, 500 cc normal saline bolus, albuterol  PCCM asked to admit for further workup and treatment.  Nephrology consulted, likely will need urgent dialysis.  Please see "Significant Hospital Events" section below for full detailed hospital course.   Pertinent  Medical History   Past Medical History:  Diagnosis Date   Anemia    Cancer (HCC)    Cystocele    Depression    Diabetes mellitus without complication (HCC)    Glaucoma    Hyperlipidemia    Hypertension    Hypothyroidism     Micro Data:  3/18: GI panel>> 3/18: C. Difficile PCR>> 3/18: Blood culture x2>>  Antimicrobials:   Anti-infectives (From admission, onward)    Start     Dose/Rate Route Frequency Ordered Stop   07/28/23 1400  piperacillin-tazobactam (ZOSYN) IVPB 2.25 g        2.25 g 100 mL/hr over 30 Minutes Intravenous Every 8 hours 07/28/23 1336         Significant Hospital Events: Including procedures, antibiotic start and stop dates in addition to other pertinent events   3/18:  Admitted with Acute Pancreatitis, AKI, Hyperkalemia, and AG metabolic acidosis.  PCCM asked to admit.  Nephrology consulted, plan to initiate CRRT, will place temporary HD catheter.  Pt and husband confirm DNR/DNI status.  Interim History / Subjective:  -Pt seen in ED -Bradycardic with HR in 40's, BP stable, awake and alert -Reports epigastric abdominal pain ~ requesting something for pain, will order fentanyl -Spoke with Nephrology, requests that we place temporary dialysis catheter, likely will start CRRT   Objective   Blood pressure (!) 120/40,  pulse (!) 46, resp. rate 16, height 5\' 7"  (1.702 m), weight 81.6 kg, SpO2 100%.       No intake or output data in the 24 hours ending 07/28/23 1326 Filed Weights   07/28/23 1112  Weight: 81.6 kg    Examination: General:  Acutely ill-appearing female, laying in bed, on room air, in no acute distress HENT: Atraumatic, normocephalic, neck supple, no JVD, dry mucous membranes Lungs: Clear diminished breath sounds throughout, even, nonlabored Cardiovascular: Bradycardia, regular rhythm, S1-S2, no murmurs, rubs, gallops Abdomen: Soft, tender to palpation, no guarding or rebound tenderness, bowel sounds hypoactive Extremities: Normal bulk and tone, no deformities, 1+ edema bilateral extremities Neuro: Awake and alert, oriented x 4, moves all extremities to commands, no focal deficits, speech clear, pupils PERRLA GU: Deferred  Resolved Hospital Problem list     Assessment & Plan:   #Acute Kidney Injury #Hyperkalemia #Hyponatremia, suspect hypotonic hypovolemic with dehydration #Anion Gap Metabolic Acidosis #Lactic Acidosis -Monitor I&O's / urinary output -Follow BMP -Ensure adequate renal perfusion -Avoid nephrotoxic agents as able -Replace electrolytes as indicated ~ Pharmacy following for assistance with electrolyte replacement -IV fluids -Bicarb gtt -Shifting measures (Calcium gluconate, 2 amp bicarb, albuterol, kayexalate) -Nephrology consulted, appreciate input ~plan to initiate CRRT, will place temporary Dialysis catheter  #Concern for Acute Pancreatitis vs Gastroenteritis  #Nausea and vomiting #Hypothermia, ? Due to possible Sepsis (currently not meeting SIRS Criteria) -CT Abdomen & Pelvis 3/18: Moderate wall thickening of the duodenum and proximal jejunum is noted with surrounding inflammatory changes. This is concerning for severe enteritis or possibly peptic ulcer disease although the possibility of this being secondary to some degree of pancreatitis cannot be excluded. -Monitor fever curve -Trend WBC's -Follow cultures as above -Continue empiric Zosyn pending cultures & sensitivities -IV fluids -Pain control -Check Triglycerides, denies ETOH use  #Bradycardia, suspect due to  Hyperkalemia PMHx: A.fib on Xarelto, HTN, HLD -Continuous cardiac monitoring -Maintain MAP >65 -IV fluids -Vasopressors as needed to maintain MAP goal -Trend lactic acid until normalized -Trend HS Troponin until peaked -Check TSH  -Hold home antihypertensives for now: Amlodipine, Lasix, Lisinopril, Metoprolol  #Hypoglycemia PMHx: Hypothyroidism  -CBG's q4h; Target range of 140 to 180 -SSI as indicated -Follow ICU Hypo/Hyperglycemia protocol -Hold home Metformin, Pioglitazone, Sitagliptin -Check TSH and thyroid panel, resume home Synthroid        Pt is critically ill with multiorgan failure. Prognosis is guarded, high risk for further decompensation, cardiac arrest, and death.   Pt confirms DNR/DNI status.     Best Practice (right click and "Reselect all SmartList Selections" daily)   Diet/type: NPO DVT prophylaxis: prophylactic heparin  GI prophylaxis: PPI Lines: N/A Foley:  N/A Code Status:  DNR Last date of multidisciplinary goals of care discussion [N/A]  3/18: Pt and her husband updated at bedside on plan of care.  They are in agreement with dialysis if needed and give consent for placement of temporary dialysis catheter.  They both confirm DNR/DNI status.  Labs   CBC: Recent Labs  Lab 07/28/23 1120  WBC 8.4  HGB 12.7  HCT 37.0  MCV 95.1  PLT 378    Basic Metabolic Panel: Recent Labs  Lab 07/28/23 1120  NA 130*  K 7.5*  CL 90*  CO2 9*  GLUCOSE 38*  BUN 116*  CREATININE 10.64*  CALCIUM 10.6*   GFR: Estimated Creatinine Clearance: 5 mL/min (A) (by C-G formula based on SCr of 10.64 mg/dL (H)). Recent Labs  Lab 07/28/23 1120  WBC 8.4  Liver Function Tests: Recent Labs  Lab 07/28/23 1120  AST 33  ALT 15  ALKPHOS 29*  BILITOT 1.1  PROT 7.3  ALBUMIN 3.9   Recent Labs  Lab 07/28/23 1120  LIPASE 810*   No results for input(s): "AMMONIA" in the last 168 hours.  ABG No results found for: "PHART", "PCO2ART", "PO2ART", "HCO3",  "TCO2", "ACIDBASEDEF", "O2SAT"   Coagulation Profile: No results for input(s): "INR", "PROTIME" in the last 168 hours.  Cardiac Enzymes: No results for input(s): "CKTOTAL", "CKMB", "CKMBINDEX", "TROPONINI" in the last 168 hours.  HbA1C: Hgb A1c MFr Bld  Date/Time Value Ref Range Status  04/30/2021 01:44 PM 6.4 (H) 4.8 - 5.6 % Final    Comment:    (NOTE)         Prediabetes: 5.7 - 6.4         Diabetes: >6.4         Glycemic control for adults with diabetes: <7.0   02/25/2019 02:21 AM 6.2 (H) 4.8 - 5.6 % Final    Comment:    (NOTE) Pre diabetes:          5.7%-6.4% Diabetes:              >6.4% Glycemic control for   <7.0% adults with diabetes     CBG: Recent Labs  Lab 07/28/23 1113 07/28/23 1142 07/28/23 1300  GLUCAP 21* 113* 106*    Review of Systems:   Positives in BOLD: Gen: Denies fever, chills, weight change, fatigue, night sweats HEENT: Denies blurred vision, double vision, hearing loss, tinnitus, sinus congestion, rhinorrhea, sore throat, neck stiffness, dysphagia PULM: Denies shortness of breath, cough, sputum production, hemoptysis, wheezing CV: Denies chest pain, edema, orthopnea, paroxysmal nocturnal dyspnea, palpitations GI: Denies abdominal pain, nausea, vomiting, diarrhea, hematochezia, melena, constipation, change in bowel habits GU: Denies dysuria, hematuria, polyuria, oliguria, urethral discharge Endocrine: Denies hot or cold intolerance, polyuria, polyphagia or appetite change Derm: Denies rash, dry skin, scaling or peeling skin change Heme: Denies easy bruising, bleeding, bleeding gums Neuro: Denies headache, numbness, weakness, slurred speech, loss of memory or consciousness   Past Medical History:  She,  has a past medical history of Anemia, Cancer (HCC), Cystocele, Depression, Diabetes mellitus without complication (HCC), Glaucoma, Hyperlipidemia, Hypertension, and Hypothyroidism.   Surgical History:   Past Surgical History:  Procedure  Laterality Date   BREAST BIOPSY Right 12/23/2021   rt br stereo,x clip,  BENIGN BREAST PARENCHYMA WITH FOCAL FIBROADENOMATOID CHANGE   BREAST EXCISIONAL BIOPSY Left 07/2012   CHOLECYSTECTOMY     COLONOSCOPY WITH PROPOFOL N/A 08/17/2015   Procedure: COLONOSCOPY WITH PROPOFOL;  Surgeon: Elnita Maxwell, MD;  Location: [facility] ENDOSCOPY;  Service: Endoscopy;  Laterality: N/A;   COLONOSCOPY WITH PROPOFOL N/A 07/09/2021   Procedure: COLONOSCOPY WITH PROPOFOL;  Surgeon: Regis Bill, MD;  Location: [facility] ENDOSCOPY;  Service: Endoscopy;  Laterality: N/A;   ESOPHAGOGASTRODUODENOSCOPY N/A 02/25/2019   Procedure: ESOPHAGOGASTRODUODENOSCOPY (EGD);  Surgeon: Pasty Spillers, MD;  Location: [facility] ENDOSCOPY;  Service: Endoscopy;  Laterality: N/A;   ESOPHAGOGASTRODUODENOSCOPY N/A 05/02/2021   Procedure: ESOPHAGOGASTRODUODENOSCOPY (EGD);  Surgeon: Midge Minium, MD;  Location[facility] ENDOSCOPY;  Service: Endoscopy;  Laterality: N/A;   ESOPHAGOGASTRODUODENOSCOPY (EGD) WITH PROPOFOL N/A 04/13/2019   Procedure: ESOPHAGOGASTRODUODENOSCOPY (EGD) WITH PROPOFOL;  Surgeon: Pasty Spillers, MD;  Location: [facility] ENDOSCOPY;  Service: Endoscopy;  Laterality: N/A;     Social History:   reports that she quit smoking about 47 years ago. Her smoking use included cigarettes. She started smoking about 55  years ago. She has a 8 pack-year smoking history. She has never used smokeless tobacco. She reports current alcohol use. She reports that she does not use drugs.   Family History:  Her family history includes Diabetes in her brother, father, and mother. There is no history of Breast cancer.   Allergies Allergies  Allergen Reactions   Lipitor [Atorvastatin]     Severe weakness, joint pain   Pravastatin     Severe weakness, joint pain   Codeine    Morphine And Codeine    Vytorin [Ezetimibe-Simvastatin]      Home Medications  Prior to Admission medications   Medication Sig Start Date End Date Taking?  Authorizing Provider  amLODipine (NORVASC) 5 MG tablet Take 1 tablet (5 mg total) by mouth at bedtime. 03/14/19   Alford Highland, MD  cholecalciferol (VITAMIN D) 1000 units tablet Take 1,000 Units by mouth daily.    [provider]  Colesevelam HCl 3.75 g PACK Take 3.75 g by mouth.    [provider]  cyclobenzaprine (FLEXERIL) 5 MG tablet Take 1 tablet (5 mg total) by mouth 3 (three) times daily as needed for muscle spasms. 05/03/21   Dorcas Carrow, MD  diazepam (VALIUM) 5 MG tablet Take 2.5-5 mg by mouth every 8 (eight) hours as needed for anxiety.     [provider]  diclofenac Sodium (VOLTAREN) 1 % GEL Apply 2 g topically 4 (four) times daily.    [provider]  ferrous sulfate 325 (65 FE) MG tablet Take 325 mg by mouth daily with breakfast.    [provider]  furosemide (LASIX) 40 MG tablet Take 40 mg by mouth daily. 04/23/21   [provider]  glimepiride (AMARYL) 2 MG tablet Take 2 mg by mouth 2 (two) times daily. 03/11/21   [provider]  lidocaine (LIDODERM) 5 % Place 1 patch onto the skin daily. Remove & Discard patch within 12 hours or as directed by MD 05/04/21   Dorcas Carrow, MD  liothyronine (CYTOMEL) 50 MCG tablet Take 50 mcg by mouth every morning. 04/10/21   [provider]  lisinopril (ZESTRIL) 20 MG tablet Take 20 mg by mouth daily. 03/30/19   [provider]  metFORMIN (GLUCOPHAGE) 1000 MG tablet Take 1,000 mg by mouth 2 (two) times daily with a meal.     [provider]  metoprolol succinate (TOPROL-XL) 25 MG 24 hr tablet Take 25 mg by mouth daily. 03/25/21   [provider]  Multiple Vitamin (MULTI-VITAMINS) TABS Take 1 tablet by mouth daily.     [provider]  oxyCODONE (OXY IR/ROXICODONE) 5 MG immediate release tablet Take 1 tablet (5 mg total) by mouth every 6 (six) hours as needed for moderate pain or breakthrough pain. 05/03/21   Dorcas Carrow, MD   pantoprazole (PROTONIX) 40 MG tablet Take 1 tablet (40 mg total) by mouth 2 (two) times daily. 05/03/21 07/09/21  Dorcas Carrow, MD  pioglitazone (ACTOS) 30 MG tablet Take 30 mg by mouth daily. 02/20/21   [provider]  rivaroxaban (XARELTO) 20 MG TABS tablet Take 20 mg by mouth daily with supper.    [provider]  sitaGLIPtin (JANUVIA) 100 MG tablet Take 100 mg by mouth daily.    [provider]  SYNTHROID 150 MCG tablet Take 150 mcg by mouth daily. 03/11/18   [provider]  traMADol (ULTRAM) 50 MG tablet Take 50 mg by mouth every 6 (six) hours as needed.    [provider]     Critical care time: 60 minutes     Harlon Ditty, AGACNP-BC  Pulmonary & Critical Care Prefer epic messenger for cross cover needs If after hours, please call E-link

## 2023-07-28 NOTE — Progress Notes (Signed)
 PHARMACY CONSULT NOTE - FOLLOW UP  Pharmacy Consult for Electrolyte Monitoring and Replacement   Recent Labs: Potassium (mmol/L)  Date Value  07/28/2023 7.5 (HH)   Magnesium (mg/dL)  Date Value  29/52/8413 1.9   Calcium (mg/dL)  Date Value  24/40/1027 10.6 (H)   Albumin (g/dL)  Date Value  25/36/6440 3.9   Phosphorus (mg/dL)  Date Value  34/74/2595 3.4   Sodium (mmol/L)  Date Value  07/28/2023 130 (L)   Assessment: 78 y.o. female admitted with Acute Pancreatitis, along with Acute Kidney Injury, Hyperkalemia, and severe Anion Gap Metabolic Acidosis requiring initiation of renal replacement therapy.   Goal of Therapy:  Electrolytes WNL  Plan:  K elevated at 7.5, provider ordered: Albuterol 2.5 mg nebulized x 1 Calcium gluconate 1 g IV x 1 Sodium bicarbonate 50 mEq IV x 2, sodium bicarbonate gtt infusion Sodium polystyrene 60 g rectal x 1 Provider wants to re-check K at 1800  Merryl Hacker ,PharmD Clinical Pharmacist 07/28/2023 1:45 PM

## 2023-07-28 NOTE — ED Notes (Addendum)
 PT drank 2 grape juices while in triage. Attempted IV x3 with no success.

## 2023-07-28 NOTE — ED Notes (Signed)
 Elvina Sidle NP to bedside to assess pt, notified of rectal temp of 90.7, bair hugger in place.

## 2023-07-28 NOTE — Progress Notes (Signed)
 Patient A/O x4, stable soft pressures. MD aware. Patient on room air, hypothermic. Bear hugger in place. Medication given per MD.   K 7.1 called, MD made aware.   PRN medications given per orders, effective.

## 2023-07-29 LAB — URINE DRUG SCREEN, QUALITATIVE (ARMC ONLY)
Amphetamines, Ur Screen: NOT DETECTED
Barbiturates, Ur Screen: NOT DETECTED
Benzodiazepine, Ur Scrn: NOT DETECTED
Cannabinoid 50 Ng, Ur ~~LOC~~: NOT DETECTED
Cocaine Metabolite,Ur ~~LOC~~: NOT DETECTED
MDMA (Ecstasy)Ur Screen: NOT DETECTED
Methadone Scn, Ur: NOT DETECTED
Opiate, Ur Screen: NOT DETECTED
Phencyclidine (PCP) Ur S: NOT DETECTED
Tricyclic, Ur Screen: NOT DETECTED

## 2023-07-29 LAB — URINALYSIS, ROUTINE W REFLEX MICROSCOPIC
Bacteria, UA: NONE SEEN
Bilirubin Urine: NEGATIVE
Glucose, UA: 150 mg/dL — AB
Ketones, ur: 5 mg/dL — AB
Leukocytes,Ua: NEGATIVE
Nitrite: NEGATIVE
Protein, ur: 30 mg/dL — AB
Specific Gravity, Urine: 1.011 (ref 1.005–1.030)
pH: 5 (ref 5.0–8.0)

## 2023-07-29 LAB — GASTROINTESTINAL PANEL BY PCR, STOOL (REPLACES STOOL CULTURE)

## 2023-07-29 LAB — HEPATIC FUNCTION PANEL
ALT: 15 U/L (ref 0–44)
AST: 21 U/L (ref 15–41)
Albumin: 3.2 g/dL — ABNORMAL LOW (ref 3.5–5.0)
Alkaline Phosphatase: 24 U/L — ABNORMAL LOW (ref 38–126)
Bilirubin, Direct: 0.1 mg/dL (ref 0.0–0.2)
Indirect Bilirubin: 0.8 mg/dL (ref 0.3–0.9)
Total Bilirubin: 0.9 mg/dL (ref 0.0–1.2)
Total Protein: 5.8 g/dL — ABNORMAL LOW (ref 6.5–8.1)

## 2023-07-29 LAB — RENAL FUNCTION PANEL
Albumin: 3 g/dL — ABNORMAL LOW (ref 3.5–5.0)
Albumin: 3.1 g/dL — ABNORMAL LOW (ref 3.5–5.0)
Albumin: 2.8 g/dL — ABNORMAL LOW (ref 3.5–5.0)
Anion gap: 19 — ABNORMAL HIGH (ref 5–15)
Anion gap: 15 (ref 5–15)
Anion gap: 7 (ref 5–15)
BUN: 85 mg/dL — ABNORMAL HIGH (ref 8–23)
BUN: 63 mg/dL — ABNORMAL HIGH (ref 8–23)
BUN: 22 mg/dL (ref 8–23)
CO2: 17 mmol/L — ABNORMAL LOW (ref 22–32)
CO2: 22 mmol/L (ref 22–32)
CO2: 27 mmol/L (ref 22–32)
Calcium: 9.9 mg/dL (ref 8.9–10.3)
Calcium: 10.4 mg/dL — ABNORMAL HIGH (ref 8.9–10.3)
Calcium: 9.3 mg/dL (ref 8.9–10.3)
Chloride: 98 mmol/L (ref 98–111)
Chloride: 98 mmol/L (ref 98–111)
Chloride: 101 mmol/L (ref 98–111)
Creatinine, Ser: 7.13 mg/dL — ABNORMAL HIGH (ref 0.44–1.00)
Creatinine, Ser: 5.28 mg/dL — ABNORMAL HIGH (ref 0.44–1.00)
Creatinine, Ser: 2 mg/dL — ABNORMAL HIGH (ref 0.44–1.00)
GFR, Estimated: 6 mL/min — ABNORMAL LOW (ref >60)
GFR, Estimated: 8 mL/min — ABNORMAL LOW (ref >60)
GFR, Estimated: 26 mL/min — ABNORMAL LOW (ref >60)
Glucose, Bld: 180 mg/dL — ABNORMAL HIGH (ref 70–99)
Glucose, Bld: 100 mg/dL — ABNORMAL HIGH (ref 70–99)
Glucose, Bld: 89 mg/dL (ref 70–99)
Phosphorus: 5.7 mg/dL — ABNORMAL HIGH (ref 2.5–4.6)
Phosphorus: 4.4 mg/dL (ref 2.5–4.6)
Phosphorus: 1.9 mg/dL — ABNORMAL LOW (ref 2.5–4.6)
Potassium: 5.2 mmol/L — ABNORMAL HIGH (ref 3.5–5.1)
Potassium: 4.3 mmol/L (ref 3.5–5.1)
Potassium: 3.8 mmol/L (ref 3.5–5.1)
Sodium: 134 mmol/L — ABNORMAL LOW (ref 135–145)
Sodium: 135 mmol/L (ref 135–145)
Sodium: 135 mmol/L (ref 135–145)

## 2023-07-29 LAB — CBC
HCT: 29.2 % — ABNORMAL LOW (ref 36.0–46.0)
Hemoglobin: 10.3 g/dL — ABNORMAL LOW (ref 12.0–15.0)
MCH: 31.2 pg (ref 26.0–34.0)
MCHC: 35.3 g/dL (ref 30.0–36.0)
MCV: 88.5 fL (ref 80.0–100.0)
Platelets: 273 10*3/uL (ref 150–400)
RBC: 3.3 MIL/uL — ABNORMAL LOW (ref 3.87–5.11)
RDW: 15.2 % (ref 11.5–15.5)
WBC: 6.7 10*3/uL (ref 4.0–10.5)
nRBC: 0 % (ref 0.0–0.2)

## 2023-07-29 LAB — LIPASE, BLOOD: Lipase: 617 U/L — ABNORMAL HIGH (ref 11–51)

## 2023-07-29 LAB — MAGNESIUM: Magnesium: 2.1 mg/dL (ref 1.7–2.4)

## 2023-07-29 LAB — GLUCOSE, CAPILLARY
Glucose-Capillary: 105 mg/dL — ABNORMAL HIGH (ref 70–99)
Glucose-Capillary: 84 mg/dL (ref 70–99)
Glucose-Capillary: 88 mg/dL (ref 70–99)
Glucose-Capillary: 81 mg/dL (ref 70–99)
Glucose-Capillary: 100 mg/dL — ABNORMAL HIGH (ref 70–99)
Glucose-Capillary: 151 mg/dL — ABNORMAL HIGH (ref 70–99)

## 2023-07-29 LAB — LACTIC ACID, PLASMA
Lactic Acid, Venous: 7.3 mmol/L (ref 0.5–1.9)
Lactic Acid, Venous: 2.7 mmol/L (ref 0.5–1.9)

## 2023-07-29 LAB — C DIFFICILE QUICK SCREEN W PCR REFLEX
C Diff antigen: NEGATIVE
C Diff interpretation: NOT DETECTED
C Diff toxin: NEGATIVE

## 2023-07-29 MED ORDER — PRISMASOL BGK 4/2.5 32-4-2.5 MEQ/L EC SOLN: Status: DC

## 2023-07-29 MED ORDER — RENA-VITE PO TABS
1.0000 | ORAL_TABLET | Freq: Every day | ORAL | Status: DC
  Administered 2023-07-30 – 2023-08-06 (×8): 1 via ORAL
  Filled 2023-07-29 (×8): qty 1

## 2023-07-29 MED ORDER — RENA-VITE PO TABS
1.0000 | ORAL_TABLET | Freq: Every day | ORAL | Status: DC
  Administered 2023-07-29: 1
  Filled 2023-07-29: qty 1

## 2023-07-29 MED ORDER — BOOST / RESOURCE BREEZE PO LIQD CUSTOM
1.0000 | Freq: Three times a day (TID) | ORAL | Status: DC
  Administered 2023-07-29 – 2023-07-30 (×3): 1 via ORAL

## 2023-07-29 MED ORDER — METOPROLOL SUCCINATE ER 25 MG PO TB24
25.0000 mg | ORAL_TABLET | Freq: Every day | ORAL | Status: DC
  Administered 2023-07-29 – 2023-08-07 (×10): 25 mg via ORAL
  Filled 2023-07-29 (×10): qty 1

## 2023-07-29 MED ORDER — K PHOS MONO-SOD PHOS DI & MONO 155-852-130 MG PO TABS
500.0000 mg | ORAL_TABLET | ORAL | Status: AC
  Administered 2023-07-29 (×2): 500 mg via ORAL
  Filled 2023-07-29 (×2): qty 2

## 2023-07-29 NOTE — Inpatient Diabetes Management (Signed)
 Inpatient Diabetes Program Recommendations  AACE/ADA: New Consensus Statement on Inpatient Glycemic Control   Target Ranges:  Prepandial:   less than 140 mg/dL      Peak postprandial:   less than 180 mg/dL (1-2 hours)      Critically ill patients:  140 - 180 mg/dL    Latest Reference Range & Units 07/29/23 04:12 07/29/23 07:35  Glucose-Capillary 70 - 99 mg/dL 161 (H) 84    Latest Reference Range & Units 07/28/23 11:13 07/28/23 11:42 07/28/23 13:00 07/28/23 14:27 07/28/23 17:43 07/28/23 19:24 07/28/23 23:10  Glucose-Capillary 70 - 99 mg/dL 21 (LL) 096 (H) 045 (H) 139 (H) 209 (H) 201 (H)  Novolog 5 units & D50 50 ml (for hyperkalemia) 189 (H)    Latest Reference Range & Units 07/28/23 11:20 07/29/23 04:23  Lipase 11 - 51 U/L 810 (H) 617 (H)   Review of Glycemic Control  Diabetes history: DM2 Outpatient Diabetes medications: Amaryl 2 mg BID, Metformin 1000 mg BID, Actos 30 mg daily, Januvia 100 mg daily (per H&P, pt is taking Ozempic Qweek) Current orders for Inpatient glycemic control: CBGs Q4H  Inpatient Diabetes Program Recommendations:    Insulin: May want to consider ordering Novolog 0-6 units Q4H.  Outpatient DM: If patient is taking Ozempic outpatient, would recommend it be stopped at discharge due to acute pancreatitis.  Thanks, Orlando Penner, RN, MSN, CDCES Diabetes Coordinator Inpatient Diabetes Program [phone] (Team Pager from 8am to 5pm)

## 2023-07-29 NOTE — Progress Notes (Signed)
 NAME:  [name], MRN:  [id number], DOB:  [birth year], LOS: 1 ADMISSION DATE:  07/28/2023, CONSULTATION DATE:  07/28/23 REFERRING MD:  Dr. Fanny Bien, CHIEF COMPLAINT:  Abdominal pain, Nausea, vomiting, diarrhea   Brief Pt Description / Synopsis:  78 y.o. female admitted with Acute Pancreatitis vs. Gastroenteritis, along with Acute Kidney Injury, Hyperkalemia, and severe Anion Gap Metabolic Acidosis requiring initiation of CRRT.  History of Present Illness:  Katherine [name] is a 78 year old female with a past medical history significant for diabetes mellitus on metformin and Ozempic, hypertension, hyperlipidemia, anemia, cancer who presents to [facility] ED 07/28/2023 due to complaints of abdominal pain, nausea, vomiting, diarrhea.  Patient reports that approximately 1 week ago she began having nausea, vomiting, loose stools, and intermittent abdominal pain.  This has persisted and progressively worsened.  She also reports associated weakness, fatigue, and poor p.o. intake.  This morning she she tried to take her glucose reading on her glucometer, however was showing very low reading.  She denies fevers, chills, chest pain, shortness of breath, palpitations, dysuria, headaches.  She does report she has been on Ozempic now for 4 months and it has caused nausea and vomiting prior.  ED Course: Initial Vital Signs: Temperature F, HR 53, RR 16, BP 125/54, SpO2 100% on room air Significant Labs: Sodium 130, potassium 7.5, bicarb 9, glucose 38, BUN 116, creatinine 10.6, calcium 10.6, anion gap 31, lipase 810, high-sensitivity troponin 17, lactic acid 5.4 VBG: pH 7.15/pCO2 less than 18/pO2 69/bicarb 5.2 Urinalysis and urine drug screen currently pending Imaging CT abdomen & pelvis>>IMPRESSION: Moderate wall thickening of the duodenum and proximal jejunum is noted with surrounding inflammatory changes. This is concerning for severe enteritis or possibly peptic ulcer disease although the possibility of this  being secondary to some degree of pancreatitis cannot be excluded. Clinical correlation is recommended. Medications Administered: Calcium gluconate, 2 A of bicarb, 500 cc normal saline bolus, albuterol  07/29/23- patient awake but reports abd pain.  Her renal indices are improving on HD  Pertinent  Medical History   Past Medical History:  Diagnosis Date   Anemia    Cancer (HCC)    Cystocele    Depression    Diabetes mellitus without complication (HCC)    Glaucoma    Hyperlipidemia    Hypertension    Hypothyroidism     Micro Data:  3/18: GI panel>> 3/18: C. Difficile PCR>> 3/18: Blood culture x2>>  Antimicrobials:   Anti-infectives (From admission, onward)    Start     Dose/Rate Route Frequency Ordered Stop   07/29/23 0000  piperacillin-tazobactam (ZOSYN) IVPB 3.375 g        3.375 g 100 mL/hr over 30 Minutes Intravenous Every 6 hours 07/28/23 1704     07/28/23 1400  piperacillin-tazobactam (ZOSYN) IVPB 2.25 g  Status:  Discontinued        2.25 g 100 mL/hr over 30 Minutes Intravenous Every 8 hours 07/28/23 1336 07/28/23 1704       Significant Hospital Events: Including procedures, antibiotic start and stop dates in addition to other pertinent events   3/18:  Admitted with Acute Pancreatitis, AKI, Hyperkalemia, and AG metabolic acidosis.  PCCM asked to admit.  Nephrology consulted, plan to initiate CRRT, will place temporary HD catheter.  Pt and husband confirm DNR/DNI status.    Objective   Blood pressure (!) 136/49, pulse 70, temperature 97.8 F (36.6 C), temperature source Oral, resp. rate 18, height 5\' 7"  (1.702 m), weight 79.7 kg, SpO2 93%.  Intake/Output Summary (Last 24 hours) at 07/29/2023 2956 Last data filed at 07/29/2023 0700 Gross per 24 hour  Intake 1435.1 ml  Output 923 ml  Net 512.1 ml   Filed Weights   07/28/23 1112 07/29/23 0407  Weight: 81.6 kg 79.7 kg    Examination: General: Acutely ill-appearing female, laying in bed, on room air,  in no acute distress HENT: Atraumatic, normocephalic, neck supple, no JVD, dry mucous membranes Lungs: Clear diminished breath sounds throughout, even, nonlabored Cardiovascular: Bradycardia, regular rhythm, S1-S2, no murmurs, rubs, gallops Abdomen: Soft, tender to palpation, no guarding or rebound tenderness, bowel sounds hypoactive Extremities: Normal bulk and tone, no deformities, 1+ edema bilateral extremities Neuro: Awake and alert, oriented x 4, moves all extremities to commands, no focal deficits, speech clear, pupils PERRLA GU: Deferred  Resolved Hospital Problem list     Assessment & Plan:   #Acute Kidney Injury #Hyperkalemia #Hyponatremia, suspect hypotonic hypovolemic with dehydration #Anion Gap Metabolic Acidosis #Lactic Acidosis -Monitor I&O's / urinary output -Follow BMP -Ensure adequate renal perfusion -Avoid nephrotoxic agents as able -Replace electrolytes as indicated ~ Pharmacy following for assistance with electrolyte replacement -IV fluids -Bicarb gtt -Shifting measures (Calcium gluconate, 2 amp bicarb, albuterol, kayexalate) -Nephrology consulted, appreciate input ~plan to initiate CRRT, will place temporary Dialysis catheter  #Concern for Acute Pancreatitis vs Gastroenteritis  #Nausea and vomiting #Hypothermia, ? Due to possible Sepsis (currently not meeting SIRS Criteria) -CT Abdomen & Pelvis 3/18: Moderate wall thickening of the duodenum and proximal jejunum is noted with surrounding inflammatory changes. This is concerning for severe enteritis or possibly peptic ulcer disease although the possibility of this being secondary to some degree of pancreatitis cannot be excluded. -Monitor fever curve -Trend WBC's -Follow cultures as above -Continue empiric Zosyn pending cultures & sensitivities -IV fluids -Pain control -Check Triglycerides, denies ETOH use  #Bradycardia, suspect due to Hyperkalemia PMHx: A.fib on Xarelto, HTN, HLD -Continuous cardiac  monitoring -Maintain MAP >65 -IV fluids -Vasopressors as needed to maintain MAP goal -Trend lactic acid until normalized -Trend HS Troponin until peaked -Check TSH  -Hold home antihypertensives for now: Amlodipine, Lasix, Lisinopril, Metoprolol  #Hypoglycemia PMHx: Hypothyroidism  -CBG's q4h; Target range of 140 to 180 -SSI as indicated -Follow ICU Hypo/Hyperglycemia protocol -Hold home Metformin, Pioglitazone, Sitagliptin -Check TSH and thyroid panel, resume home Synthroid        Pt is critically ill with multiorgan failure. Prognosis is guarded, high risk for further decompensation, cardiac arrest, and death.   Pt confirms DNR/DNI status.     Best Practice (right click and "Reselect all SmartList Selections" daily)   Diet/type: NPO DVT prophylaxis: prophylactic heparin  GI prophylaxis: PPI Lines: N/A Foley:  N/A Code Status:  DNR Last date of multidisciplinary goals of care discussion [N/A]  3/18: Pt and her husband updated at bedside on plan of care.  They are in agreement with dialysis if needed and give consent for placement of temporary dialysis catheter.  They both confirm DNR/DNI status.  Labs   CBC: Recent Labs  Lab 07/28/23 1120 07/29/23 0423  WBC 8.4 6.7  HGB 12.7 10.3*  HCT 37.0 29.2*  MCV 95.1 88.5  PLT 378 273    Basic Metabolic Panel: Recent Labs  Lab 07/28/23 1120 07/28/23 1728 07/29/23 0029 07/29/23 0423  NA 130* 131* 134* 135  K 7.5* 7.1* 5.2* 4.3  CL 90* 89* 98 98  CO2 9* 9* 17* 22  GLUCOSE 38* 217* 180* 100*  BUN 116* 112* 85* 63*  CREATININE 10.64*  10.29* 7.13* 5.28*  CALCIUM 10.6* 10.6* 9.9 10.4*  MG  --   --   --  2.1  PHOS  --   --  5.7* 4.4   GFR: Estimated Creatinine Clearance: 10 mL/min (A) (by C-G formula based on SCr of 5.28 mg/dL (H)). Recent Labs  Lab 07/28/23 1120 07/28/23 1325 07/29/23 0029 07/29/23 0423  WBC 8.4  --   --  6.7  LATICACIDVEN  --  5.4* 7.3* 2.7*    Liver Function Tests: Recent Labs  Lab  07/28/23 1120 07/29/23 0029 07/29/23 0423  AST 33  --  21  ALT 15  --  15  ALKPHOS 29*  --  24*  BILITOT 1.1  --  0.9  PROT 7.3  --  5.8*  ALBUMIN 3.9 3.0* 3.1*  3.2*   Recent Labs  Lab 07/28/23 1120 07/29/23 0423  LIPASE 810* 617*   No results for input(s): "AMMONIA" in the last 168 hours.  ABG    Component Value Date/Time   HCO3 5.2 (L) 07/28/2023 1325   ACIDBASEDEF 21.4 (H) 07/28/2023 1325   O2SAT 94 07/28/2023 1325     Coagulation Profile: No results for input(s): "INR", "PROTIME" in the last 168 hours.  Cardiac Enzymes: No results for input(s): "CKTOTAL", "CKMB", "CKMBINDEX", "TROPONINI" in the last 168 hours.  HbA1C: Hgb A1c MFr Bld  Date/Time Value Ref Range Status  04/30/2021 01:44 PM 6.4 (H) 4.8 - 5.6 % Final    Comment:    (NOTE)         Prediabetes: 5.7 - 6.4         Diabetes: >6.4         Glycemic control for adults with diabetes: <7.0   02/25/2019 02:21 AM 6.2 (H) 4.8 - 5.6 % Final    Comment:    (NOTE) Pre diabetes:          5.7%-6.4% Diabetes:              >6.4% Glycemic control for   <7.0% adults with diabetes     CBG: Recent Labs  Lab 07/28/23 1743 07/28/23 1924 07/28/23 2310 07/29/23 0412 07/29/23 0735  GLUCAP 209* 201* 189* 105* 84    Review of Systems:   Positives in BOLD: Gen: Denies fever, chills, weight change, fatigue, night sweats HEENT: Denies blurred vision, double vision, hearing loss, tinnitus, sinus congestion, rhinorrhea, sore throat, neck stiffness, dysphagia PULM: Denies shortness of breath, cough, sputum production, hemoptysis, wheezing CV: Denies chest pain, edema, orthopnea, paroxysmal nocturnal dyspnea, palpitations GI: Denies abdominal pain, nausea, vomiting, diarrhea, hematochezia, melena, constipation, change in bowel habits GU: Denies dysuria, hematuria, polyuria, oliguria, urethral discharge Endocrine: Denies hot or cold intolerance, polyuria, polyphagia or appetite change Derm: Denies rash, dry skin,  scaling or peeling skin change Heme: Denies easy bruising, bleeding, bleeding gums Neuro: Denies headache, numbness, weakness, slurred speech, loss of memory or consciousness   Past Medical History:  She,  has a past medical history of Anemia, Cancer (HCC), Cystocele, Depression, Diabetes mellitus without complication (HCC), Glaucoma, Hyperlipidemia, Hypertension, and Hypothyroidism.   Surgical History:   Past Surgical History:  Procedure Laterality Date   BREAST BIOPSY Right 12/23/2021   rt br stereo,x clip,  BENIGN BREAST PARENCHYMA WITH FOCAL FIBROADENOMATOID CHANGE   BREAST EXCISIONAL BIOPSY Left 07/2012   CHOLECYSTECTOMY     COLONOSCOPY WITH PROPOFOL N/A 08/17/2015   Procedure: COLONOSCOPY WITH PROPOFOL;  Surgeon: Elnita Maxwell, MD;  Location: [facility] ENDOSCOPY;  Service: Endoscopy;  Laterality: N/A;  COLONOSCOPY WITH PROPOFOL N/A 07/09/2021   Procedure: COLONOSCOPY WITH PROPOFOL;  Surgeon: Regis Bill, MD;  Location: [facility] ENDOSCOPY;  Service: Endoscopy;  Laterality: N/A;   ESOPHAGOGASTRODUODENOSCOPY N/A 02/25/2019   Procedure: ESOPHAGOGASTRODUODENOSCOPY (EGD);  Surgeon: Pasty Spillers, MD;  Location: [facility] ENDOSCOPY;  Service: Endoscopy;  Laterality: N/A;   ESOPHAGOGASTRODUODENOSCOPY N/A 05/02/2021   Procedure: ESOPHAGOGASTRODUODENOSCOPY (EGD);  Surgeon: Midge Minium, MD;  Location: [facility] ENDOSCOPY;  Service: Endoscopy;  Laterality: N/A;   ESOPHAGOGASTRODUODENOSCOPY (EGD) WITH PROPOFOL N/A 04/13/2019   Procedure: ESOPHAGOGASTRODUODENOSCOPY (EGD) WITH PROPOFOL;  Surgeon: Pasty Spillers, MD;  Location: [facility] ENDOSCOPY;  Service: Endoscopy;  Laterality: N/A;     Social History:   reports that she quit smoking about 47 years ago. Her smoking use included cigarettes. She started smoking about 55 years ago. She has a 8 pack-year smoking history. She has never used smokeless tobacco. She reports current alcohol use. She reports that she does not use drugs.   Family  History:  Her family history includes Diabetes in her brother, father, and mother. There is no history of Breast cancer.   Allergies Allergies  Allergen Reactions   Lipitor [Atorvastatin]     Severe weakness, joint pain   Pravastatin     Severe weakness, joint pain   Codeine    Morphine And Codeine    Vytorin [Ezetimibe-Simvastatin]      Home Medications  Prior to Admission medications   Medication Sig Start Date End Date Taking? Authorizing Provider  amLODipine (NORVASC) 5 MG tablet Take 1 tablet (5 mg total) by mouth at bedtime. 03/14/19   Alford Highland, MD  cholecalciferol (VITAMIN D) 1000 units tablet Take 1,000 Units by mouth daily.    [provider]  Colesevelam HCl 3.75 g PACK Take 3.75 g by mouth.    [provider]  cyclobenzaprine (FLEXERIL) 5 MG tablet Take 1 tablet (5 mg total) by mouth 3 (three) times daily as needed for muscle spasms. 05/03/21   Dorcas Carrow, MD  diazepam (VALIUM) 5 MG tablet Take 2.5-5 mg by mouth every 8 (eight) hours as needed for anxiety.     [provider]  diclofenac Sodium (VOLTAREN) 1 % GEL Apply 2 g topically 4 (four) times daily.    [provider]  ferrous sulfate 325 (65 FE) MG tablet Take 325 mg by mouth daily with breakfast.    [provider]  furosemide (LASIX) 40 MG tablet Take 40 mg by mouth daily. 04/23/21   [provider]  glimepiride (AMARYL) 2 MG tablet Take 2 mg by mouth 2 (two) times daily. 03/11/21   [provider]  lidocaine (LIDODERM) 5 % Place 1 patch onto the skin daily. Remove & Discard patch within 12 hours or as directed by MD 05/04/21   Dorcas Carrow, MD  liothyronine (CYTOMEL) 50 MCG tablet Take 50 mcg by mouth every morning. 04/10/21   [provider]  lisinopril (ZESTRIL) 20 MG tablet Take 20 mg by mouth daily. 03/30/19   [provider]  metFORMIN (GLUCOPHAGE) 1000 MG tablet Take 1,000 mg by mouth 2 (two) times daily with a meal.      [provider]  metoprolol succinate (TOPROL-XL) 25 MG 24 hr tablet Take 25 mg by mouth daily. 03/25/21   [provider]  Multiple Vitamin (MULTI-VITAMINS) TABS Take 1 tablet by mouth daily.     [provider]  oxyCODONE (OXY IR/ROXICODONE) 5 MG immediate release tablet Take 1 tablet (5 mg total) by mouth every  6 (six) hours as needed for moderate pain or breakthrough pain. 05/03/21   Dorcas Carrow, MD  pantoprazole (PROTONIX) 40 MG tablet Take 1 tablet (40 mg total) by mouth 2 (two) times daily. 05/03/21 07/09/21  Dorcas Carrow, MD  pioglitazone (ACTOS) 30 MG tablet Take 30 mg by mouth daily. 02/20/21   [provider]  rivaroxaban (XARELTO) 20 MG TABS tablet Take 20 mg by mouth daily with supper.    [provider]  sitaGLIPtin (JANUVIA) 100 MG tablet Take 100 mg by mouth daily.    [provider]  SYNTHROID 150 MCG tablet Take 150 mcg by mouth daily. 03/11/18   [provider]  traMADol (ULTRAM) 50 MG tablet Take 50 mg by mouth every 6 (six) hours as needed.    [provider]     Critical care provider statement:   Total critical care time: 33 minutes   Performed by: Karna Christmas MD   Critical care time was exclusive of separately billable procedures and treating other patients.   Critical care was necessary to treat or prevent imminent or life-threatening deterioration.   Critical care was time spent personally by me on the following activities: development of treatment plan with patient and/or surrogate as well as nursing, discussions with consultants, evaluation of patient's response to treatment, examination of patient, obtaining history from patient or surrogate, ordering and performing treatments and interventions, ordering and review of laboratory studies, ordering and review of radiographic studies, pulse oximetry and re-evaluation of patient's condition.    Vida Rigger, M.D.  Pulmonary & Critical Care  Medicine

## 2023-07-29 NOTE — Progress Notes (Signed)
 PHARMACY CONSULT NOTE - FOLLOW UP  Pharmacy Consult for Electrolyte Monitoring and Replacement   Recent Labs: Potassium (mmol/L)  Date Value  07/29/2023 3.8   Magnesium (mg/dL)  Date Value  40/98/1191 2.1   Calcium (mg/dL)  Date Value  47/82/9562 9.3   Albumin (g/dL)  Date Value  13/12/6576 2.8 (L)   Phosphorus (mg/dL)  Date Value  46/96/2952 1.9 (L)   Sodium (mmol/L)  Date Value  07/29/2023 135   Assessment: 78 y.o. female admitted with Acute metabolic acidosis , Acute Pancreatitis, along with Acute Kidney Injury, Hyperkalemia, and severe Anion Gap Metabolic Acidosis requiring initiation of renal replacement therapy.  CRRT started 3/18  Goal of Therapy:  Electrolytes WNL  Plan:  - Phos 1.9  Will order Kphos neutral 500mg  po q4h x 2 doses - Nephro following, BMP BID ordered - Will recheck Mag, Phos, and BMP with morning labs  Angelique Blonder ,PharmD Clinical Pharmacist 07/29/2023 7:25 PM

## 2023-07-29 NOTE — Progress Notes (Signed)
 Patient has not voided this shift. Bladder scan revealed greater that 450.  In and out catheter preformed per policy and procedure with Claudette Stapler, RN as witness.  drained from bladder. Patient tolerated without complication.

## 2023-07-29 NOTE — Progress Notes (Signed)
 Patient with no urine output this shift. Bladder scan revealed 56ml of urine in bladder. Dr. Karna Christmas made aware.

## 2023-07-29 NOTE — Progress Notes (Addendum)
 PHARMACY CONSULT NOTE - FOLLOW UP  Pharmacy Consult for Electrolyte Monitoring and Replacement   Recent Labs: Potassium (mmol/L)  Date Value  07/29/2023 4.3   Magnesium (mg/dL)  Date Value  16/02/9603 2.1   Calcium (mg/dL)  Date Value  54/01/8118 10.4 (H)   Albumin (g/dL)  Date Value  14/78/2956 3.1 (L)  07/29/2023 3.2 (L)   Phosphorus (mg/dL)  Date Value  21/30/8657 4.4   Sodium (mmol/L)  Date Value  07/29/2023 135   Assessment: 78 y.o. female admitted with Acute Pancreatitis, along with Acute Kidney Injury, Hyperkalemia, and severe Anion Gap Metabolic Acidosis requiring initiation of renal replacement therapy.   Goal of Therapy:  Electrolytes WNL  Plan:  - No replacement indicated today - Nephro following, BMP BID ordered - Will recheck Mag, Phos, and BMP with morning labs  Bettey Costa ,PharmD Clinical Pharmacist 07/29/2023 7:52 AM

## 2023-07-29 NOTE — Plan of Care (Signed)
  Problem: Education: Goal: Knowledge of General Education information will improve Description: Including pain rating scale, medication(s)/side effects and non-pharmacologic comfort measures Outcome: Progressing   Problem: Health Behavior/Discharge Planning: Goal: Ability to manage health-related needs will improve Outcome: Progressing   Problem: Clinical Measurements: Goal: Ability to maintain clinical measurements within normal limits will improve Outcome: Progressing Goal: Will remain free from infection Outcome: Progressing   Problem: Coping: Goal: Level of anxiety will decrease Outcome: Progressing   Problem: Pain Managment: Goal: General experience of comfort will improve and/or be controlled Outcome: Progressing   Problem: Activity: Goal: Risk for activity intolerance will decrease Outcome: Not Progressing   Problem: Nutrition: Goal: Adequate nutrition will be maintained Outcome: Not Progressing   Problem: Elimination: Goal: Will not experience complications related to bowel motility Outcome: Not Progressing

## 2023-07-29 NOTE — Progress Notes (Signed)
 [facility]  ROUNDING NOTE   Subjective:    Patient initiated on CRRT yesterday. Creatinine trending down. Hyperkalemia corrected. Metabolic acidosis much improved.   03/18 0701 - 03/19 0700 In: 1435.1 [I.V.:735.1; IV Piggyback:700] Out: 923 [Urine:585; Stool:100] Lab Results  Component Value Date   CREATININE 5.28 (H) 07/29/2023   CREATININE 7.13 (H) 07/29/2023   CREATININE 10.29 (H) 07/28/2023     Objective:  Vital signs in last 24 hours:  Temp:  [97.3 F (36.3 C)-98.2 F (36.8 C)] 97.7 F (36.5 C) (03/19 1300) Pulse Rate:  [48-96] 87 (03/19 1500) Resp:  [13-23] 15 (03/19 1500) BP: (113-138)/(41-54) 130/49 (03/19 1500) SpO2:  [79 %-97 %] 94 % (03/19 1534) Weight:  [79.7 kg] 79.7 kg (03/19 0407)  Weight change:  Filed Weights   07/28/23 1112 07/29/23 0407  Weight: 81.6 kg 79.7 kg    Intake/Output: I/O last 3 completed shifts: In: 1435.1 [I.V.:735.1; IV Piggyback:700] Out: 923 [Urine:585; Stool:100]   Intake/Output this shift:  No intake/output data recorded.  Physical Exam: General: No acute distress  Head: Normocephalic, atraumatic. Moist oral mucosal membranes  Neck: Supple  Lungs:  Clear to auscultation, normal effort  Heart: S1S2 no rubs  Abdomen:  Soft, nontender, bowel sounds present  Extremities: Trace peripheral edema.  Neurologic: Awake, alert, following commands  Skin: No acute rash  Access: Right femoral dialysis catheter    Basic Metabolic Panel: Recent Labs  Lab 07/28/23 1120 07/28/23 1728 07/29/23 0029 07/29/23 0423  NA 130* 131* 134* 135  K 7.5* 7.1* 5.2* 4.3  CL 90* 89* 98 98  CO2 9* 9* 17* 22  GLUCOSE 38* 217* 180* 100*  BUN 116* 112* 85* 63*  CREATININE 10.64* 10.29* 7.13* 5.28*  CALCIUM 10.6* 10.6* 9.9 10.4*  MG  --   --   --  2.1  PHOS  --   --  5.7* 4.4    Liver Function Tests: Recent Labs  Lab 07/28/23 1120 07/29/23 0029 07/29/23 0423  AST 33  --  21  ALT 15  --  15  ALKPHOS 29*  --  24*   BILITOT 1.1  --  0.9  PROT 7.3  --  5.8*  ALBUMIN 3.9 3.0* 3.1*  3.2*   Recent Labs  Lab 07/28/23 1120 07/29/23 0423  LIPASE 810* 617*   No results for input(s): "AMMONIA" in the last 168 hours.  CBC: Recent Labs  Lab 07/28/23 1120 07/29/23 0423  WBC 8.4 6.7  HGB 12.7 10.3*  HCT 37.0 29.2*  MCV 95.1 88.5  PLT 378 273    Cardiac Enzymes: No results for input(s): "CKTOTAL", "CKMB", "CKMBINDEX", "TROPONINI" in the last 168 hours.  BNP: Invalid input(s): "POCBNP"  CBG: Recent Labs  Lab 07/28/23 2310 07/29/23 0412 07/29/23 0735 07/29/23 1107 07/29/23 1522  GLUCAP 189* 105* 84 88 81    Microbiology: Results for orders placed or performed during the hospital encounter of 07/28/23  MRSA Next Gen by PCR, Nasal     Status: None   Collection Time: 07/28/23  2:30 PM   Specimen: Nasal Mucosa; Nasal Swab  Result Value Ref Range Status   MRSA by PCR Next Gen NOT DETECTED NOT DETECTED Final    Comment: (NOTE) The GeneXpert MRSA Assay (FDA approved for NASAL specimens only), is one component of a comprehensive MRSA colonization surveillance program. It is not intended to diagnose MRSA infection nor to guide or monitor treatment for MRSA infections. Test performance is not FDA approved in patients less than 2  years old. Performed at [facility], [address] [place], [place] [address]   Culture, blood (Routine X 2) w Reflex to ID Panel     Status: None (Preliminary result)   Collection Time: 07/28/23  3:32 PM   Specimen: BLOOD  Result Value Ref Range Status   Specimen Description BLOOD BLOOD LEFT WRIST  Final   Special Requests   Final    BOTTLES DRAWN AEROBIC ONLY Blood Culture results may not be optimal due to an inadequate volume of blood received in culture bottles   Culture   Final    NO GROWTH < 24 HOURS Performed at [facility], [address] [place], [place] [address]    Report Status PENDING  Incomplete  Culture, blood  (Routine X 2) w Reflex to ID Panel     Status: None (Preliminary result)   Collection Time: 07/28/23  3:42 PM   Specimen: BLOOD  Result Value Ref Range Status   Specimen Description BLOOD BLOOD RIGHT HAND  Final   Special Requests   Final    BOTTLES DRAWN AEROBIC AND ANAEROBIC Blood Culture results may not be optimal due to an inadequate volume of blood received in culture bottles   Culture   Final    NO GROWTH < 24 HOURS Performed at [facility], [address] [place], [place] [address]    Report Status PENDING  Incomplete  Gastrointestinal Panel by PCR , Stool     Status: None   Collection Time: 07/28/23 10:29 PM   Specimen: Stool  Result Value Ref Range Status   Campylobacter species NOT DETECTED NOT DETECTED Corrected    Comment: CORRECTED ON 03/19 AT 0238: PREVIOUSLY REPORTED AS NONE DETECTED   Plesimonas shigelloides NOT DETECTED NOT DETECTED Corrected    Comment: CORRECTED ON 03/19 AT 0238: PREVIOUSLY REPORTED AS NONE DETECTED   Salmonella species NOT DETECTED NOT DETECTED Corrected    Comment: CORRECTED ON 03/19 AT 0238: PREVIOUSLY REPORTED AS NONE DETECTED   Yersinia enterocolitica NOT DETECTED NOT DETECTED Corrected    Comment: CORRECTED ON 03/19 AT 0238: PREVIOUSLY REPORTED AS NONE DETECTED   Vibrio species NOT DETECTED NOT DETECTED Corrected    Comment: CORRECTED ON 03/19 AT 0238: PREVIOUSLY REPORTED AS NONE DETECTED   Vibrio cholerae NOT DETECTED NOT DETECTED Corrected    Comment: CORRECTED ON 03/19 AT 0238: PREVIOUSLY REPORTED AS NONE DETECTED   Enteroaggregative E coli (EAEC) NOT DETECTED NOT DETECTED Corrected    Comment: CORRECTED ON 03/19 AT 0238: PREVIOUSLY REPORTED AS NONE DETECTED   Enteropathogenic E coli (EPEC) NOT DETECTED NOT DETECTED Corrected    Comment: CORRECTED ON 03/19 AT 0238: PREVIOUSLY REPORTED AS NONE DETECTED   Enterotoxigenic E coli (ETEC) NOT DETECTED NOT DETECTED Corrected    Comment: CORRECTED ON 03/19 AT 0238: PREVIOUSLY REPORTED  AS NONE DETECTED   Shiga like toxin producing E coli (STEC) NOT DETECTED NOT DETECTED Corrected    Comment: CORRECTED ON 03/19 AT 0238: PREVIOUSLY REPORTED AS NONE DETECTED   E. coli O157 NOT DETECTED NOT DETECTED Corrected    Comment: CORRECTED ON 03/19 AT 0238: PREVIOUSLY REPORTED AS NONE DETECTED   Shigella/Enteroinvasive E coli (EIEC) NOT DETECTED NOT DETECTED Corrected    Comment: CORRECTED ON 03/19 AT 0238: PREVIOUSLY REPORTED AS NONE DETECTED   Cryptosporidium NOT DETECTED NOT DETECTED Corrected    Comment: CORRECTED ON 03/19 AT 0238: PREVIOUSLY REPORTED AS NONE DETECTED   Cyclospora cayetanensis NOT DETECTED NOT DETECTED Corrected    Comment: CORRECTED  ON 03/19 AT 0238: PREVIOUSLY REPORTED AS NONE DETECTED   Entamoeba histolytica NOT DETECTED NOT DETECTED Corrected    Comment: CORRECTED ON 03/19 AT 0238: PREVIOUSLY REPORTED AS NONE DETECTED   Giardia lamblia NOT DETECTED NOT DETECTED Corrected    Comment: CORRECTED ON 03/19 AT 0238: PREVIOUSLY REPORTED AS NONE DETECTED   Adenovirus F40/41 NOT DETECTED NOT DETECTED Corrected    Comment: CORRECTED ON 03/19 AT 0238: PREVIOUSLY REPORTED AS NONE DETECTED   Astrovirus NOT DETECTED NOT DETECTED Corrected    Comment: CORRECTED ON 03/19 AT 0238: PREVIOUSLY REPORTED AS NONE DETECTED   Norovirus GI/GII NOT DETECTED NOT DETECTED Corrected    Comment: CORRECTED ON 03/19 AT 0238: PREVIOUSLY REPORTED AS NONE DETECTED   Rotavirus A NOT DETECTED NOT DETECTED Corrected    Comment: CORRECTED ON 03/19 AT 0238: PREVIOUSLY REPORTED AS NONE DETECTED   Sapovirus (I, II, IV, and V) NOT DETECTED NOT DETECTED Corrected    Comment: Performed at [facility], [address] [place], [place] [address] CORRECTED ON 03/19 AT 1324: PREVIOUSLY REPORTED AS NONE DETECTED   C Difficile Quick Screen w PCR reflex     Status: None   Collection Time: 07/28/23 10:29 PM   Specimen: STOOL  Result Value Ref Range Status   C Diff antigen NEGATIVE NEGATIVE Final    C Diff toxin NEGATIVE NEGATIVE Final   C Diff interpretation No C. difficile detected.  Final    Comment: Performed at [facility], [address] [place], [place] [address]    Coagulation Studies: No results for input(s): "LABPROT", "INR" in the last 72 hours.  Urinalysis: Recent Labs    07/29/23 0528  COLORURINE STRAW*  LABSPEC 1.011  PHURINE 5.0  GLUCOSEU 150*  HGBUR SMALL*  BILIRUBINUR NEGATIVE  KETONESUR 5*  PROTEINUR 30*  NITRITE NEGATIVE  LEUKOCYTESUR NEGATIVE      Imaging: CT ABDOMEN PELVIS WO CONTRAST Result Date: 07/28/2023 CLINICAL DATA:  Vomiting for 1 week. EXAM: CT ABDOMEN AND PELVIS WITHOUT CONTRAST TECHNIQUE: Multidetector CT imaging of the abdomen and pelvis was performed following the standard protocol without IV contrast. RADIATION DOSE REDUCTION: This exam was performed according to the departmental dose-optimization program which includes automated exposure control, adjustment of the mA and/or kV according to patient size and/or use of iterative reconstruction technique. COMPARISON:  April 30, 2021. FINDINGS: Lower chest: No acute abnormality. Hepatobiliary: No focal liver abnormality is seen. Status post cholecystectomy. No biliary dilatation. Pancreas: Some degree of fatty replacement of the pancreas is noted. Mild inflammatory changes are noted around the pancreas. No definite pseudocyst formation is noted. Spleen: Normal in size without focal abnormality. Adrenals/Urinary Tract: Adrenal glands are unremarkable. Kidneys are normal, without renal calculi, focal lesion, or hydronephrosis. Bladder is unremarkable. Stomach/Bowel: Stomach is unremarkable. The appendix appears normal. There is no evidence of bowel obstruction. Colon is unremarkable. However, there is interval development of moderate wall thickening of the duodenum and proximal jejunum with surrounding inflammatory changes. It is uncertain if this represents primary inflammation of the  bowel or peptic ulcer disease, or if this is secondary to pancreatitis. Vascular/Lymphatic: Aortic atherosclerosis. No enlarged abdominal or pelvic lymph nodes. Reproductive: Uterus and bilateral adnexa are unremarkable. Other: No ascites or hernia is noted. Musculoskeletal: Stable old L2 compression fracture. No acute osseous abnormality is noted. IMPRESSION: Moderate wall thickening of the duodenum and proximal jejunum is noted with surrounding inflammatory changes. This is concerning for severe enteritis or possibly peptic ulcer disease although the possibility of this being  secondary to some degree of pancreatitis cannot be excluded. Clinical correlation is recommended. Aortic Atherosclerosis (ICD10-I70.0). Electronically Signed   By: Lupita Raider M.D.   On: 07/28/2023 15:18     Medications:    piperacillin-tazobactam (ZOSYN)  IV 3.375 g (07/29/23 1202)   prismasol BGK 4/2.5 400 mL/hr at 07/29/23 1319   prismasol BGK 4/2.5 400 mL/hr at 07/29/23 1319   prismasol BGK 4/2.5 2,500 mL/hr at 07/29/23 1518    Chlorhexidine Gluconate Cloth  6 each Topical Daily   feeding supplement  1 Container Oral TID BM   heparin  5,000 Units Subcutaneous Q8H   metoprolol succinate  25 mg Oral Daily   multivitamin  1 tablet Per Tube QHS   pantoprazole (PROTONIX) IV  40 mg Intravenous Q12H   sodium chloride flush  10-40 mL Intracatheter Q12H   docusate sodium, heparin, HYDROmorphone (DILAUDID) injection, ondansetron (ZOFRAN) IV, mouth rinse, polyethylene glycol, sodium chloride flush  Assessment/ Plan:  78 y.o. female : Pt is a 78 y.o. female with a PMHx of hypothyroidism, diabetes mellitus type 2 treated with Ozempic and metformin, hypertension, hyperlipidemia, atrial fibrillation, basal cell carcinoma, cystocele, depression, glaucoma, urinary incontinence who was admitted to [facility] on 07/28/2023 for evaluation of nausea, vomiting, loose stools, and abdominal pain.   1.  Acute kidney injury/chronic kidney  disease stage IIIb baseline EGFR 31/diabetes mellitus type 2 with chronic kidney disease/hyperkalemia.  Patient admitted with severe acute kidney injury.  Suspect development of ATN from prolonged nausea and vomiting.   Update: Continue CRRT for now.  Hyperkalemia, acute metabolic acidosis, and azotemia all improved.  Change potassium bath to 4K.  2.  Acute metabolic acidosis.  Ozempic and metformin likely playing some role here.  Continue to hold both.  CRRT has significantly improved metabolic acidosis.   3.  Pancreatitis.  Lipase noted to be 810.  CT scan abdomen pelvis reviewed.  Lipase down to 617.   LOS: 1 Daysia Vandenboom 3/19/20254:05 PM

## 2023-07-29 NOTE — Progress Notes (Signed)
 Patient with known history of A-fib.  Patient in a-fib via cardiac monitor with heart rate 95-115. Patient with 02 sats 88-90%. Zada Girt, NP notified.  Order obtained for heart rate control and 2L nasal cannula applied. Nurse will continue to monitor.

## 2023-07-29 NOTE — Plan of Care (Signed)

## 2023-07-30 LAB — RENAL FUNCTION PANEL
Albumin: 2.8 g/dL — ABNORMAL LOW (ref 3.5–5.0)
Anion gap: 8 (ref 5–15)
BUN: 13 mg/dL (ref 8–23)
CO2: 29 mmol/L (ref 22–32)
Calcium: 8.5 mg/dL — ABNORMAL LOW (ref 8.9–10.3)
Chloride: 101 mmol/L (ref 98–111)
Creatinine, Ser: 1.56 mg/dL — ABNORMAL HIGH (ref 0.44–1.00)
GFR, Estimated: 34 mL/min — ABNORMAL LOW (ref >60)
Glucose, Bld: 123 mg/dL — ABNORMAL HIGH (ref 70–99)
Phosphorus: 2 mg/dL — ABNORMAL LOW (ref 2.5–4.6)
Potassium: 3.9 mmol/L (ref 3.5–5.1)
Sodium: 138 mmol/L (ref 135–145)

## 2023-07-30 LAB — CBC
HCT: 27.9 % — ABNORMAL LOW (ref 36.0–46.0)
Hemoglobin: 9.7 g/dL — ABNORMAL LOW (ref 12.0–15.0)
MCH: 31.6 pg (ref 26.0–34.0)
MCHC: 34.8 g/dL (ref 30.0–36.0)
MCV: 90.9 fL (ref 80.0–100.0)
Platelets: 168 10*3/uL (ref 150–400)
RBC: 3.07 MIL/uL — ABNORMAL LOW (ref 3.87–5.11)
RDW: 15.4 % (ref 11.5–15.5)
WBC: 7.8 10*3/uL (ref 4.0–10.5)
nRBC: 0 % (ref 0.0–0.2)

## 2023-07-30 LAB — THYROID PANEL WITH TSH
Free Thyroxine Index: 1.9 (ref 1.2–4.9)
T3 Uptake Ratio: 31 % (ref 24–39)
T4, Total: 6.1 ug/dL (ref 4.5–12.0)
TSH: 2.86 u[IU]/mL (ref 0.450–4.500)

## 2023-07-30 LAB — GLUCOSE, CAPILLARY
Glucose-Capillary: 124 mg/dL — ABNORMAL HIGH (ref 70–99)
Glucose-Capillary: 128 mg/dL — ABNORMAL HIGH (ref 70–99)
Glucose-Capillary: 124 mg/dL — ABNORMAL HIGH (ref 70–99)
Glucose-Capillary: 109 mg/dL — ABNORMAL HIGH (ref 70–99)
Glucose-Capillary: 126 mg/dL — ABNORMAL HIGH (ref 70–99)
Glucose-Capillary: 131 mg/dL — ABNORMAL HIGH (ref 70–99)

## 2023-07-30 LAB — MAGNESIUM: Magnesium: 2.2 mg/dL (ref 1.7–2.4)

## 2023-07-30 MED ORDER — K PHOS MONO-SOD PHOS DI & MONO 155-852-130 MG PO TABS
500.0000 mg | ORAL_TABLET | ORAL | Status: AC
  Administered 2023-07-30 (×3): 500 mg via ORAL
  Filled 2023-07-30 (×3): qty 2

## 2023-07-30 MED ORDER — OXYCODONE-ACETAMINOPHEN 5-325 MG PO TABS
1.0000 | ORAL_TABLET | ORAL | Status: DC | PRN
  Administered 2023-07-30 – 2023-07-31 (×4): 2 via ORAL
  Administered 2023-07-31 (×2): 1 via ORAL
  Administered 2023-08-01 – 2023-08-03 (×12): 2 via ORAL
  Administered 2023-08-04: 1 via ORAL
  Administered 2023-08-04 (×4): 2 via ORAL
  Administered 2023-08-05 (×3): 1 via ORAL
  Administered 2023-08-05 – 2023-08-07 (×5): 2 via ORAL
  Filled 2023-07-30: qty 1
  Filled 2023-07-30 (×2): qty 2
  Filled 2023-07-30: qty 1
  Filled 2023-07-30: qty 2
  Filled 2023-07-30: qty 1
  Filled 2023-07-30 (×2): qty 2
  Filled 2023-07-30: qty 1
  Filled 2023-07-30 (×5): qty 2
  Filled 2023-07-30: qty 1
  Filled 2023-07-30 (×7): qty 2
  Filled 2023-07-30: qty 1
  Filled 2023-07-30 (×9): qty 2

## 2023-07-30 MED ORDER — PIPERACILLIN-TAZOBACTAM 3.375 G IVPB
3.3750 g | Freq: Three times a day (TID) | INTRAVENOUS | Status: DC
  Administered 2023-07-30 – 2023-07-31 (×2): 3.375 g via INTRAVENOUS
  Filled 2023-07-30 (×2): qty 50

## 2023-07-30 MED ORDER — OXYCODONE-ACETAMINOPHEN 5-325 MG PO TABS
1.0000 | ORAL_TABLET | Freq: Four times a day (QID) | ORAL | Status: DC | PRN
  Administered 2023-07-30: 2 via ORAL
  Filled 2023-07-30: qty 2

## 2023-07-30 MED ORDER — OXYCODONE-ACETAMINOPHEN 5-325 MG PO TABS
1.0000 | ORAL_TABLET | ORAL | Status: DC | PRN
  Filled 2023-07-30: qty 2

## 2023-07-30 MED ORDER — ADULT MULTIVITAMIN W/MINERALS CH
1.0000 | ORAL_TABLET | Freq: Every day | ORAL | Status: DC
  Administered 2023-07-31 – 2023-08-05 (×6): 1 via ORAL
  Filled 2023-07-30 (×6): qty 1

## 2023-07-30 MED ORDER — ENSURE MAX PROTEIN PO LIQD
11.0000 [oz_av] | Freq: Two times a day (BID) | ORAL | Status: DC
  Administered 2023-07-30 – 2023-08-06 (×9): 11 [oz_av] via ORAL
  Filled 2023-07-30: qty 330

## 2023-07-30 NOTE — Progress Notes (Signed)
 NAME:  Katherine [name], MRN:  [id number], DOB:  [birth year], LOS: 2 ADMISSION DATE:  07/28/2023, CONSULTATION DATE:  07/28/23 REFERRING MD:  Dr. Fanny Bien, CHIEF COMPLAINT:  Abdominal pain, Nausea, vomiting, diarrhea   Brief Pt Description / Synopsis:  78 y.o. female admitted with Acute Pancreatitis vs. Gastroenteritis, along with Acute Kidney Injury, Hyperkalemia, and severe Anion Gap Metabolic Acidosis requiring initiation of CRRT.  History of Present Illness:  Katherine [name] is a 78 year old female with a past medical history significant for diabetes mellitus on metformin and Ozempic, hypertension, hyperlipidemia, anemia, cancer who presents to [facility] ED 07/28/2023 due to complaints of abdominal pain, nausea, vomiting, diarrhea.  Patient reports that approximately 1 week ago she began having nausea, vomiting, loose stools, and intermittent abdominal pain.  This has persisted and progressively worsened.  She also reports associated weakness, fatigue, and poor p.o. intake.  This morning she she tried to take her glucose reading on her glucometer, however was showing very low reading.  She denies fevers, chills, chest pain, shortness of breath, palpitations, dysuria, headaches.  She does report she has been on Ozempic now for 4 months and it has caused nausea and vomiting prior.   07/29/23- patient awake but reports abd pain.  Her renal indices are improving on HD 07/30/23- patient on CRRT, not eating well due to abd pain. Renal function has improved. S/p RD evaluation with refined nourished devoid of fat/sugars  Pertinent  Medical History   Past Medical History:  Diagnosis Date   Anemia    Cancer (HCC)    Cystocele    Depression    Diabetes mellitus without complication (HCC)    Glaucoma    Hyperlipidemia    Hypertension    Hypothyroidism     Micro Data:  3/18: GI panel>> 3/18: C. Difficile PCR>> 3/18: Blood culture x2>>  Antimicrobials:   Anti-infectives (From admission, onward)     Start     Dose/Rate Route Frequency Ordered Stop   07/29/23 0000  piperacillin-tazobactam (ZOSYN) IVPB 3.375 g        3.375 g 100 mL/hr over 30 Minutes Intravenous Every 6 hours 07/28/23 1704     07/28/23 1400  piperacillin-tazobactam (ZOSYN) IVPB 2.25 g  Status:  Discontinued        2.25 g 100 mL/hr over 30 Minutes Intravenous Every 8 hours 07/28/23 1336 07/28/23 1704       Significant Hospital Events: Including procedures, antibiotic start and stop dates in addition to other pertinent events   3/18:  Admitted with Acute Pancreatitis, AKI, Hyperkalemia, and AG metabolic acidosis.  PCCM asked to admit.  Nephrology consulted, plan to initiate CRRT, will place temporary HD catheter.  Pt and husband confirm DNR/DNI status.    Objective   Blood pressure 133/60, pulse 73, temperature 97.7 F (36.5 C), temperature source Oral, resp. rate 10, height 5\' 7"  (1.702 m), weight 83.1 kg, SpO2 99%.        Intake/Output Summary (Last 24 hours) at 07/30/2023 1046 Last data filed at 07/30/2023 1000 Gross per 24 hour  Intake 322.76 ml  Output 1619 ml  Net -1296.24 ml   Filed Weights   07/28/23 1112 07/29/23 0407 07/30/23 0407  Weight: 81.6 kg 79.7 kg 83.1 kg    Examination: General: Acutely ill-appearing female, laying in bed, on room air, in no acute distress HENT: Atraumatic, normocephalic, neck supple, no JVD, dry mucous membranes Lungs: Clear diminished breath sounds throughout, even, nonlabored Cardiovascular: Bradycardia, regular rhythm, S1-S2, no murmurs, rubs, gallops Abdomen:  Soft, tender to palpation, no guarding or rebound tenderness, bowel sounds hypoactive Extremities: Normal bulk and tone, no deformities, 1+ edema bilateral extremities Neuro: Awake and alert, oriented x 4, moves all extremities to commands, no focal deficits, speech clear, pupils PERRLA GU: Deferred  Resolved Hospital Problem list     Assessment & Plan:   #Acute Kidney Injury #Hyperkalemia #Hyponatremia,  suspect hypotonic hypovolemic with dehydration #Anion Gap Metabolic Acidosis #Lactic Acidosis -Monitor I&O's / urinary output -Follow BMP -Ensure adequate renal perfusion -Avoid nephrotoxic agents as able -Replace electrolytes as indicated ~ Pharmacy following for assistance with electrolyte replacement -IV fluids -Bicarb gtt -Shifting measures (Calcium gluconate, 2 amp bicarb, albuterol, kayexalate) -Nephrology consulted, appreciate input ~plan to initiate CRRT, will place temporary Dialysis catheter  # Acute Pancreatitis -suspect due to GLP1ag  #Nausea and vomiting #SEPSIS HAS BEEN RULED OUT  #Bradycardia, suspect due to Hyperkalemia PMHx: A.fib on Xarelto, HTN, HLD -Continuous cardiac monitoring -Maintain MAP >65 -IV fluids -Vasopressors as needed to maintain MAP goal -Trend lactic acid until normalized -Trend HS Troponin until peaked -Check TSH  -Hold home antihypertensives for now: Amlodipine, Lasix, Lisinopril, Metoprolol  #Hypoglycemia PMHx: Hypothyroidism  -CBG's q4h; Target range of 140 to 180 -SSI as indicated -Follow ICU Hypo/Hyperglycemia protocol -Hold home Metformin, Pioglitazone, Sitagliptin -Check TSH and thyroid panel, resume home Synthroid    Best Practice (right click and "Reselect all SmartList Selections" daily)   Diet/type: NPO DVT prophylaxis: prophylactic heparin  GI prophylaxis: PPI Lines: N/A Foley:  N/A Code Status:  DNR Last date of multidisciplinary goals of care discussion [N/A]   Labs   CBC: Recent Labs  Lab 07/28/23 1120 07/29/23 0423 07/30/23 0429  WBC 8.4 6.7 7.8  HGB 12.7 10.3* 9.7*  HCT 37.0 29.2* 27.9*  MCV 95.1 88.5 90.9  PLT 378 273 168    Basic Metabolic Panel: Recent Labs  Lab 07/28/23 1728 07/29/23 0029 07/29/23 0423 07/29/23 1621 07/30/23 0429  NA 131* 134* 135 135 138  K 7.1* 5.2* 4.3 3.8 3.9  CL 89* 98 98 101 101  CO2 9* 17* 22 27 29   GLUCOSE 217* 180* 100* 89 123*  BUN 112* 85* 63* 22 13   CREATININE 10.29* 7.13* 5.28* 2.00* 1.56*  CALCIUM 10.6* 9.9 10.4* 9.3 8.5*  MG  --   --  2.1  --  2.2  PHOS  --  5.7* 4.4 1.9* 2.0*   GFR: Estimated Creatinine Clearance: 34.5 mL/min (A) (by C-G formula based on SCr of 1.56 mg/dL (H)). Recent Labs  Lab 07/28/23 1120 07/28/23 1325 07/29/23 0029 07/29/23 0423 07/30/23 0429  WBC 8.4  --   --  6.7 7.8  LATICACIDVEN  --  5.4* 7.3* 2.7*  --     Liver Function Tests: Recent Labs  Lab 07/28/23 1120 07/29/23 0029 07/29/23 0423 07/29/23 1621 07/30/23 0429  AST 33  --  21  --   --   ALT 15  --  15  --   --   ALKPHOS 29*  --  24*  --   --   BILITOT 1.1  --  0.9  --   --   PROT 7.3  --  5.8*  --   --   ALBUMIN 3.9 3.0* 3.1*  3.2* 2.8* 2.8*   Recent Labs  Lab 07/28/23 1120 07/29/23 0423  LIPASE 810* 617*   No results for input(s): "AMMONIA" in the last 168 hours.  ABG    Component Value Date/Time   HCO3 5.2 (L) 07/28/2023  1325   ACIDBASEDEF 21.4 (H) 07/28/2023 1325   O2SAT 94 07/28/2023 1325     Coagulation Profile: No results for input(s): "INR", "PROTIME" in the last 168 hours.  Cardiac Enzymes: No results for input(s): "CKTOTAL", "CKMB", "CKMBINDEX", "TROPONINI" in the last 168 hours.  HbA1C: Hgb A1c MFr Bld  Date/Time Value Ref Range Status  04/30/2021 01:44 PM 6.4 (H) 4.8 - 5.6 % Final    Comment:    (NOTE)         Prediabetes: 5.7 - 6.4         Diabetes: >6.4         Glycemic control for adults with diabetes: <7.0   02/25/2019 02:21 AM 6.2 (H) 4.8 - 5.6 % Final    Comment:    (NOTE) Pre diabetes:          5.7%-6.4% Diabetes:              >6.4% Glycemic control for   <7.0% adults with diabetes     CBG: Recent Labs  Lab 07/29/23 1522 07/29/23 1919 07/29/23 2319 07/30/23 0405 07/30/23 0723  GLUCAP 81 100* 151* 124* 128*    Review of Systems:   Positives in BOLD: Gen: Denies fever, chills, weight change, fatigue, night sweats HEENT: Denies blurred vision, double vision, hearing loss,  tinnitus, sinus congestion, rhinorrhea, sore throat, neck stiffness, dysphagia PULM: Denies shortness of breath, cough, sputum production, hemoptysis, wheezing CV: Denies chest pain, edema, orthopnea, paroxysmal nocturnal dyspnea, palpitations GI: Denies abdominal pain, nausea, vomiting, diarrhea, hematochezia, melena, constipation, change in bowel habits GU: Denies dysuria, hematuria, polyuria, oliguria, urethral discharge Endocrine: Denies hot or cold intolerance, polyuria, polyphagia or appetite change Derm: Denies rash, dry skin, scaling or peeling skin change Heme: Denies easy bruising, bleeding, bleeding gums Neuro: Denies headache, numbness, weakness, slurred speech, loss of memory or consciousness   Past Medical History:  She,  has a past medical history of Anemia, Cancer (HCC), Cystocele, Depression, Diabetes mellitus without complication (HCC), Glaucoma, Hyperlipidemia, Hypertension, and Hypothyroidism.   Surgical History:   Past Surgical History:  Procedure Laterality Date   BREAST BIOPSY Right 12/23/2021   rt br stereo,x clip,  BENIGN BREAST PARENCHYMA WITH FOCAL FIBROADENOMATOID CHANGE   BREAST EXCISIONAL BIOPSY Left 07/2012   CHOLECYSTECTOMY     COLONOSCOPY WITH PROPOFOL N/A 08/17/2015   Procedure: COLONOSCOPY WITH PROPOFOL;  Surgeon: Elnita Maxwell, MD;  Location: [facility] ENDOSCOPY;  Service: Endoscopy;  Laterality: N/A;   COLONOSCOPY WITH PROPOFOL N/A 07/09/2021   Procedure: COLONOSCOPY WITH PROPOFOL;  Surgeon: Regis Bill, MD;  Location: [facility] ENDOSCOPY;  Service: Endoscopy;  Laterality: N/A;   ESOPHAGOGASTRODUODENOSCOPY N/A 02/25/2019   Procedure: ESOPHAGOGASTRODUODENOSCOPY (EGD);  Surgeon: Pasty Spillers, MD;  Location[facility] ENDOSCOPY;  Service: Endoscopy;  Laterality: N/A;   ESOPHAGOGASTRODUODENOSCOPY N/A 05/02/2021   Procedure: ESOPHAGOGASTRODUODENOSCOPY (EGD);  Surgeon: Midge Minium, MD;  Location: [facility] ENDOSCOPY;  Service: Endoscopy;  Laterality:  N/A;   ESOPHAGOGASTRODUODENOSCOPY (EGD) WITH PROPOFOL N/A 04/13/2019   Procedure: ESOPHAGOGASTRODUODENOSCOPY (EGD) WITH PROPOFOL;  Surgeon: Pasty Spillers, MD;  Location: [facility] ENDOSCOPY;  Service: Endoscopy;  Laterality: N/A;     Social History:   reports that she quit smoking about 47 years ago. Her smoking use included cigarettes. She started smoking about 55 years ago. She has a 8 pack-year smoking history. She has never used smokeless tobacco. She reports current alcohol use. She reports that she does not use drugs.   Family History:  Her family history includes Diabetes in her brother,  father, and mother. There is no history of Breast cancer.   Allergies Allergies  Allergen Reactions   Lipitor [Atorvastatin]     Severe weakness, joint pain   Pravastatin     Severe weakness, joint pain   Codeine    Morphine And Codeine    Vytorin [Ezetimibe-Simvastatin]      Home Medications  Prior to Admission medications   Medication Sig Start Date End Date Taking? Authorizing Provider  amLODipine (NORVASC) 5 MG tablet Take 1 tablet (5 mg total) by mouth at bedtime. 03/14/19   Alford Highland, MD  cholecalciferol (VITAMIN D) 1000 units tablet Take 1,000 Units by mouth daily.    [provider]  Colesevelam HCl 3.75 g PACK Take 3.75 g by mouth.    [provider]  cyclobenzaprine (FLEXERIL) 5 MG tablet Take 1 tablet (5 mg total) by mouth 3 (three) times daily as needed for muscle spasms. 05/03/21   Dorcas Carrow, MD  diazepam (VALIUM) 5 MG tablet Take 2.5-5 mg by mouth every 8 (eight) hours as needed for anxiety.     [provider]  diclofenac Sodium (VOLTAREN) 1 % GEL Apply 2 g topically 4 (four) times daily.    [provider]  ferrous sulfate 325 (65 FE) MG tablet Take 325 mg by mouth daily with breakfast.    [provider]  furosemide (LASIX) 40 MG tablet Take 40 mg by mouth daily. 04/23/21   [provider]  glimepiride  (AMARYL) 2 MG tablet Take 2 mg by mouth 2 (two) times daily. 03/11/21   [provider]  lidocaine (LIDODERM) 5 % Place 1 patch onto the skin daily. Remove & Discard patch within 12 hours or as directed by MD 05/04/21   Dorcas Carrow, MD  liothyronine (CYTOMEL) 50 MCG tablet Take 50 mcg by mouth every morning. 04/10/21   [provider]  lisinopril (ZESTRIL) 20 MG tablet Take 20 mg by mouth daily. 03/30/19   [provider]  metFORMIN (GLUCOPHAGE) 1000 MG tablet Take 1,000 mg by mouth 2 (two) times daily with a meal.     [provider]  metoprolol succinate (TOPROL-XL) 25 MG 24 hr tablet Take 25 mg by mouth daily. 03/25/21   [provider]  Multiple Vitamin (MULTI-VITAMINS) TABS Take 1 tablet by mouth daily.     [provider]  oxyCODONE (OXY IR/ROXICODONE) 5 MG immediate release tablet Take 1 tablet (5 mg total) by mouth every 6 (six) hours as needed for moderate pain or breakthrough pain. 05/03/21   Dorcas Carrow, MD  pantoprazole (PROTONIX) 40 MG tablet Take 1 tablet (40 mg total) by mouth 2 (two) times daily. 05/03/21 07/09/21  Dorcas Carrow, MD  pioglitazone (ACTOS) 30 MG tablet Take 30 mg by mouth daily. 02/20/21   [provider]  rivaroxaban (XARELTO) 20 MG TABS tablet Take 20 mg by mouth daily with supper.    [provider]  sitaGLIPtin (JANUVIA) 100 MG tablet Take 100 mg by mouth daily.    [provider]  SYNTHROID 150 MCG tablet Take 150 mcg by mouth daily. 03/11/18   [provider]  traMADol (ULTRAM) 50 MG tablet Take 50 mg by mouth every 6 (six) hours as needed.    [provider]     Critical care provider statement:   Total critical care time: 33 minutes   Performed by: Karna Christmas MD   Critical care time was exclusive of separately billable procedures and treating other patients.   Critical  care was necessary to treat or prevent imminent or life-threatening deterioration.    Critical care was time spent personally by me on the following activities: development of treatment plan with patient and/or surrogate as well as nursing, discussions with consultants, evaluation of patient's response to treatment, examination of patient, obtaining history from patient or surrogate, ordering and performing treatments and interventions, ordering and review of laboratory studies, ordering and review of radiographic studies, pulse oximetry and re-evaluation of patient's condition.    Vida Rigger, M.D.  Pulmonary & Critical Care Medicine

## 2023-07-30 NOTE — Plan of Care (Signed)
  Problem: Education: Goal: Knowledge of General Education information will improve Description: Including pain rating scale, medication(s)/side effects and non-pharmacologic comfort measures Outcome: Progressing   Problem: Health Behavior/Discharge Planning: Goal: Ability to manage health-related needs will improve Outcome: Progressing   Problem: Clinical Measurements: Goal: Ability to maintain clinical measurements within normal limits will improve Outcome: Progressing Goal: Will remain free from infection Outcome: Progressing Goal: Diagnostic test results will improve Outcome: Progressing   Problem: Activity: Goal: Risk for activity intolerance will decrease Outcome: Progressing   Problem: Coping: Goal: Level of anxiety will decrease Outcome: Progressing

## 2023-07-30 NOTE — Progress Notes (Signed)
 PHARMACY CONSULT NOTE - FOLLOW UP  Pharmacy Consult for Electrolyte Monitoring and Replacement   Recent Labs: Potassium (mmol/L)  Date Value  07/30/2023 3.9   Magnesium (mg/dL)  Date Value  84/69/6295 2.2   Calcium (mg/dL)  Date Value  28/41/3244 8.5 (L)   Albumin (g/dL)  Date Value  05/14/7251 2.8 (L)   Phosphorus (mg/dL)  Date Value  66/44/0347 2.0 (L)   Sodium (mmol/L)  Date Value  07/30/2023 138   Assessment: 78 y.o. female admitted with Acute Pancreatitis, along with Acute Kidney Injury, Hyperkalemia, and severe Anion Gap Metabolic Acidosis requiring initiation of renal replacement therapy.   Goal of Therapy:  Electrolytes WNL  Plan:  - Phos 2.0: Kphos 500mg  PO x 3 - Nephro following, BMP BID ordered - Will recheck Mag, Phos, and BMP with morning labs  Bettey Costa ,PharmD Clinical Pharmacist 07/30/2023 7:33 AM

## 2023-07-30 NOTE — Progress Notes (Signed)
 [facility]  ROUNDING NOTE   Subjective:    Creatinine trended down to 1.56. Urine output was 1.5 L over the preceding 24 hours. We instructed nursing to discontinue CRRT given these findings.  03/19 0701 - 03/20 0700 In: 262.8 [P.O.:80; IV Piggyback:182.8] Out: 1619 [Urine:1525] Lab Results  Component Value Date   CREATININE 1.56 (H) 07/30/2023   CREATININE 2.00 (H) 07/29/2023   CREATININE 5.28 (H) 07/29/2023     Objective:  Vital signs in last 24 hours:  Temp:  [97.7 F (36.5 C)-98.4 F (36.9 C)] 98 F (36.7 C) (03/20 0800) Pulse Rate:  [58-91] 65 (03/20 1600) Resp:  [8-23] 23 (03/20 1600) BP: (100-151)/(48-82) 151/65 (03/20 1600) SpO2:  [97 %-100 %] 97 % (03/20 1600) Weight:  [83.1 kg] 83.1 kg (03/20 0407)  Weight change: 1.453 kg Filed Weights   07/28/23 1112 07/29/23 0407 07/30/23 0407  Weight: 81.6 kg 79.7 kg 83.1 kg    Intake/Output: I/O last 3 completed shifts: In: 1058.1 [P.O.:80; I.V.:645.4; IV Piggyback:332.8] Out: 2542 [Urine:2110; Stool:100]   Intake/Output this shift:  Total I/O In: 190 [P.O.:30; Other:110; IV Piggyback:50] Out: 75 [Urine:75]  Physical Exam: General: No acute distress  Head: Normocephalic, atraumatic. Moist oral mucosal membranes  Neck: Supple  Lungs:  Clear to auscultation, normal effort  Heart: S1S2 no rubs  Abdomen:  Soft, nontender, bowel sounds present  Extremities: Trace peripheral edema.  Neurologic: Awake, alert, following commands  Skin: No acute rash  Access: Right femoral dialysis catheter    Basic Metabolic Panel: Recent Labs  Lab 07/28/23 1728 07/29/23 0029 07/29/23 0423 07/29/23 1621 07/30/23 0429  NA 131* 134* 135 135 138  K 7.1* 5.2* 4.3 3.8 3.9  CL 89* 98 98 101 101  CO2 9* 17* 22 27 29   GLUCOSE 217* 180* 100* 89 123*  BUN 112* 85* 63* 22 13  CREATININE 10.29* 7.13* 5.28* 2.00* 1.56*  CALCIUM 10.6* 9.9 10.4* 9.3 8.5*  MG  --   --  2.1  --  2.2  PHOS  --  5.7* 4.4 1.9* 2.0*     Liver Function Tests: Recent Labs  Lab 07/28/23 1120 07/29/23 0029 07/29/23 0423 07/29/23 1621 07/30/23 0429  AST 33  --  21  --   --   ALT 15  --  15  --   --   ALKPHOS 29*  --  24*  --   --   BILITOT 1.1  --  0.9  --   --   PROT 7.3  --  5.8*  --   --   ALBUMIN 3.9 3.0* 3.1*  3.2* 2.8* 2.8*   Recent Labs  Lab 07/28/23 1120 07/29/23 0423  LIPASE 810* 617*   No results for input(s): "AMMONIA" in the last 168 hours.  CBC: Recent Labs  Lab 07/28/23 1120 07/29/23 0423 07/30/23 0429  WBC 8.4 6.7 7.8  HGB 12.7 10.3* 9.7*  HCT 37.0 29.2* 27.9*  MCV 95.1 88.5 90.9  PLT 378 273 168    Cardiac Enzymes: No results for input(s): "CKTOTAL", "CKMB", "CKMBINDEX", "TROPONINI" in the last 168 hours.  BNP: Invalid input(s): "POCBNP"  CBG: Recent Labs  Lab 07/29/23 2319 07/30/23 0405 07/30/23 0723 07/30/23 1105 07/30/23 1556  GLUCAP 151* 124* 128* 124* 109*    Microbiology: Results for orders placed or performed during the hospital encounter of 07/28/23  MRSA Next Gen by PCR, Nasal     Status: None   Collection Time: 07/28/23  2:30 PM   Specimen: Nasal  Mucosa; Nasal Swab  Result Value Ref Range Status   MRSA by PCR Next Gen NOT DETECTED NOT DETECTED Final    Comment: (NOTE) The GeneXpert MRSA Assay (FDA approved for NASAL specimens only), is one component of a comprehensive MRSA colonization surveillance program. It is not intended to diagnose MRSA infection nor to guide or monitor treatment for MRSA infections. Test performance is not FDA approved in patients less than 20 years old. Performed at [facility], [address] [place], [place] [address]   Culture, blood (Routine X 2) w Reflex to ID Panel     Status: None (Preliminary result)   Collection Time: 07/28/23  3:32 PM   Specimen: BLOOD  Result Value Ref Range Status   Specimen Description BLOOD BLOOD LEFT WRIST  Final   Special Requests   Final    BOTTLES DRAWN AEROBIC ONLY Blood  Culture results may not be optimal due to an inadequate volume of blood received in culture bottles   Culture   Final    NO GROWTH 2 DAYS Performed at [facility], [address] [place], [place] [address]    Report Status PENDING  Incomplete  Culture, blood (Routine X 2) w Reflex to ID Panel     Status: None (Preliminary result)   Collection Time: 07/28/23  3:42 PM   Specimen: BLOOD  Result Value Ref Range Status   Specimen Description BLOOD BLOOD RIGHT HAND  Final   Special Requests   Final    BOTTLES DRAWN AEROBIC AND ANAEROBIC Blood Culture results may not be optimal due to an inadequate volume of blood received in culture bottles   Culture   Final    NO GROWTH 2 DAYS Performed at [facility], [address] [place], [place] [address]    Report Status PENDING  Incomplete  Gastrointestinal Panel by PCR , Stool     Status: None   Collection Time: 07/28/23 10:29 PM   Specimen: Stool  Result Value Ref Range Status   Campylobacter species NOT DETECTED NOT DETECTED Corrected    Comment: CORRECTED ON 03/19 AT 0238: PREVIOUSLY REPORTED AS NONE DETECTED   Plesimonas shigelloides NOT DETECTED NOT DETECTED Corrected    Comment: CORRECTED ON 03/19 AT 0238: PREVIOUSLY REPORTED AS NONE DETECTED   Salmonella species NOT DETECTED NOT DETECTED Corrected    Comment: CORRECTED ON 03/19 AT 0238: PREVIOUSLY REPORTED AS NONE DETECTED   Yersinia enterocolitica NOT DETECTED NOT DETECTED Corrected    Comment: CORRECTED ON 03/19 AT 0238: PREVIOUSLY REPORTED AS NONE DETECTED   Vibrio species NOT DETECTED NOT DETECTED Corrected    Comment: CORRECTED ON 03/19 AT 0238: PREVIOUSLY REPORTED AS NONE DETECTED   Vibrio cholerae NOT DETECTED NOT DETECTED Corrected    Comment: CORRECTED ON 03/19 AT 0238: PREVIOUSLY REPORTED AS NONE DETECTED   Enteroaggregative E coli (EAEC) NOT DETECTED NOT DETECTED Corrected    Comment: CORRECTED ON 03/19 AT 0238: PREVIOUSLY REPORTED AS NONE DETECTED    Enteropathogenic E coli (EPEC) NOT DETECTED NOT DETECTED Corrected    Comment: CORRECTED ON 03/19 AT 0238: PREVIOUSLY REPORTED AS NONE DETECTED   Enterotoxigenic E coli (ETEC) NOT DETECTED NOT DETECTED Corrected    Comment: CORRECTED ON 03/19 AT 0238: PREVIOUSLY REPORTED AS NONE DETECTED   Shiga like toxin producing E coli (STEC) NOT DETECTED NOT DETECTED Corrected    Comment: CORRECTED ON 03/19 AT 0238: PREVIOUSLY REPORTED AS NONE DETECTED   E. coli O157 NOT DETECTED NOT DETECTED Corrected  Comment: CORRECTED ON 03/19 AT 9147: PREVIOUSLY REPORTED AS NONE DETECTED   Shigella/Enteroinvasive E coli (EIEC) NOT DETECTED NOT DETECTED Corrected    Comment: CORRECTED ON 03/19 AT 0238: PREVIOUSLY REPORTED AS NONE DETECTED   Cryptosporidium NOT DETECTED NOT DETECTED Corrected    Comment: CORRECTED ON 03/19 AT 0238: PREVIOUSLY REPORTED AS NONE DETECTED   Cyclospora cayetanensis NOT DETECTED NOT DETECTED Corrected    Comment: CORRECTED ON 03/19 AT 0238: PREVIOUSLY REPORTED AS NONE DETECTED   Entamoeba histolytica NOT DETECTED NOT DETECTED Corrected    Comment: CORRECTED ON 03/19 AT 0238: PREVIOUSLY REPORTED AS NONE DETECTED   Giardia lamblia NOT DETECTED NOT DETECTED Corrected    Comment: CORRECTED ON 03/19 AT 0238: PREVIOUSLY REPORTED AS NONE DETECTED   Adenovirus F40/41 NOT DETECTED NOT DETECTED Corrected    Comment: CORRECTED ON 03/19 AT 0238: PREVIOUSLY REPORTED AS NONE DETECTED   Astrovirus NOT DETECTED NOT DETECTED Corrected    Comment: CORRECTED ON 03/19 AT 0238: PREVIOUSLY REPORTED AS NONE DETECTED   Norovirus GI/GII NOT DETECTED NOT DETECTED Corrected    Comment: CORRECTED ON 03/19 AT 0238: PREVIOUSLY REPORTED AS NONE DETECTED   Rotavirus A NOT DETECTED NOT DETECTED Corrected    Comment: CORRECTED ON 03/19 AT 0238: PREVIOUSLY REPORTED AS NONE DETECTED   Sapovirus (I, II, IV, and V) NOT DETECTED NOT DETECTED Corrected    Comment: Performed at [facility], [address] [place]  [place], [place] [address] CORRECTED ON 03/19 AT 2130: PREVIOUSLY REPORTED AS NONE DETECTED   C Difficile Quick Screen w PCR reflex     Status: None   Collection Time: 07/28/23 10:29 PM   Specimen: STOOL  Result Value Ref Range Status   C Diff antigen NEGATIVE NEGATIVE Final   C Diff toxin NEGATIVE NEGATIVE Final   C Diff interpretation No C. difficile detected.  Final    Comment: Performed at [facility], [address] [place], [place] [address]    Coagulation Studies: No results for input(s): "LABPROT", "INR" in the last 72 hours.  Urinalysis: Recent Labs    07/29/23 0528  COLORURINE STRAW*  LABSPEC 1.011  PHURINE 5.0  GLUCOSEU 150*  HGBUR SMALL*  BILIRUBINUR NEGATIVE  KETONESUR 5*  PROTEINUR 30*  NITRITE NEGATIVE  LEUKOCYTESUR NEGATIVE      Imaging: No results found.    Medications:    piperacillin-tazobactam (ZOSYN)  IV      Chlorhexidine Gluconate Cloth  6 each Topical Daily   heparin  5,000 Units Subcutaneous Q8H   metoprolol succinate  25 mg Oral Daily   multivitamin  1 tablet Oral QHS   [START ON 07/31/2023] multivitamin with minerals  1 tablet Oral Daily   pantoprazole (PROTONIX) IV  40 mg Intravenous Q12H   phosphorus  500 mg Oral Q4H   Ensure Max Protein  11 oz Oral BID   sodium chloride flush  10-40 mL Intracatheter Q12H   docusate sodium, HYDROmorphone (DILAUDID) injection, ondansetron (ZOFRAN) IV, mouth rinse, oxyCODONE-acetaminophen, polyethylene glycol, sodium chloride flush  Assessment/ Plan:  78 y.o. female : Pt is a 78 y.o. female with a PMHx of hypothyroidism, diabetes mellitus type 2 treated with Ozempic and metformin, hypertension, hyperlipidemia, atrial fibrillation, basal cell carcinoma, cystocele, depression, glaucoma, urinary incontinence who was admitted to [facility] on 07/28/2023 for evaluation of nausea, vomiting, loose stools, and abdominal pain.   1.  Acute kidney injury/chronic kidney disease stage IIIb baseline EGFR  31/diabetes mellitus type 2 with chronic kidney disease/hyperkalemia.  Patient admitted with severe acute  kidney injury.  Suspect development of ATN from prolonged nausea and vomiting.  CRRT stopped 07/30/2023. Update: We have stopped CRRT at this time.  Creatinine down to 1.56.  Reevaluate for further need of dialysis over the course of the hospitalization.  2.  Acute metabolic acidosis.  Ozempic and metformin likely playing some role here.  Serum bicarbonate currently 29.   3.  Pancreatitis.  Initial lipase noted to be 810.  CT scan abdomen pelvis reviewed.  Lipase may have been elevated due to renal failure.   LOS: 2 Carlton Sweaney 3/20/20254:54 PM

## 2023-07-30 NOTE — Plan of Care (Signed)

## 2023-07-30 NOTE — Progress Notes (Addendum)
 Initial Nutrition Assessment  DOCUMENTATION CODES:   Not applicable  INTERVENTION:   Recommend fluoroscopy guided post pyloric dobhoff tube placement and nutrition support if pt's oral intake does not improve over the next 24hrs.   Ensure Max protein supplement po BID, each supplement provides 150kcal and 30g of protein.  MVI po daily   Low fat diet   Pt at high refeed risk; recommend monitor potassium, magnesium and phosphorus labs daily until stable  Daily weights   If NGT placed, recommend:  Vital 1.5@55ml /hr- Initiate at 68ml/hr and increase by 96ml/hr q 8 hours until goal rate is reached.   Free water flushes 30ml q4 hours to maintain tube patency   Regimen provides 1980kcal/day, 89g/day of protein and 1130ml/day of free water.   NUTRITION DIAGNOSIS:   Inadequate oral intake related to acute illness as evidenced by per patient/family report.  GOAL:   Patient will meet greater than or equal to 90% of their needs  MONITOR:   PO intake, Supplement acceptance, Labs, Weight trends, Skin, I & O's  REASON FOR ASSESSMENT:   Consult Assessment of nutrition requirement/status  ASSESSMENT:   78 y/o female with h/o DM, IDA, PUD, HTN, depression, hypothyroidism and HLD who is admitted with acute pancreatitis, AKI and possible enteritis.  Met with pt in room today. Pt reports decreased appetite and oral intake for the past 3 months after starting Ozempic.  Pt reports early satiety for months and nausea, vomiting and abdominal pain that started about one week pta. Pt reports that her main complaint today is abdominal pain. Pt initiated on a clear liquid diet yesterday. Pt has been unable to eat more than bites secondary to abdominal pain. Pt initiated on a regular diet today as the liquid diets are high in sugar and fat. RD discussed with pt the importance of adequate nutrition needed to preserve lean muscle and support healing. Discussed with patient that pancreatitis outcomes  are better with initiation of early nutrition. Recommendation is for dobhoff tube placement and nutrition support if patient is unable to eat by tomorrow; pt is agreeable with this plan. Would recommend post pyloric placement if possible r/t ongoing abdominal pain. Pt is at high refeed risk. CRRT initiated on 3/18.   Per chart, pt is down 10lbs(5%) over the past 3 months; this is not significant.   Medications reviewed and include: heparin, MVI, protonix, Kphos, zosyn  Labs reviewed: K 3.9 wnl, creat 1.56(H), P 2.0(L), Mg 2.2 wnl Hgb 9.7(L), Hct 27.9(L) Cbgs- 124, 128, 124 x 24 hrs   NUTRITION - FOCUSED PHYSICAL EXAM:  Flowsheet Row Most Recent Value  Orbital Region No depletion  Upper Arm Region Mild depletion  Thoracic and Lumbar Region No depletion  Buccal Region No depletion  Temple Region Mild depletion  Clavicle Bone Region Moderate depletion  Clavicle and Acromion Bone Region Moderate depletion  Scapular Bone Region No depletion  Dorsal Hand No depletion  Patellar Region No depletion  Anterior Thigh Region No depletion  Posterior Calf Region No depletion  Edema (RD Assessment) Mild  Hair Reviewed  Eyes Reviewed  Mouth Reviewed  Skin Reviewed  Nails Reviewed   Diet Order:   Diet Order             Diet regular Room service appropriate? Yes; Fluid consistency: Thin  Diet effective now                  EDUCATION NEEDS:   Education needs have been addressed  Skin:  Skin  Assessment: Reviewed RN Assessment  Last BM:  3/19- type 7  Height:   Ht Readings from Last 1 Encounters:  07/28/23 5\' 7"  (1.702 m)    Weight:   Wt Readings from Last 1 Encounters:  07/30/23 83.1 kg    Ideal Body Weight:  61.36 kg  BMI:  Body mass index is 28.69 kg/m.  Estimated Nutritional Needs:   Kcal:  1700-1900kcal/day  Protein:  85-95g/day  Fluid:  1.7-1.9L/day  Betsey Holiday MS, RD, LDN If unable to be reached, please send secure chat to "RD inpatient" available  from 8:00a-4:00p daily

## 2023-07-30 NOTE — Plan of Care (Signed)
 Problem: Education: Goal: Knowledge of General Education information will improve Description: Including pain rating scale, medication(s)/side effects and non-pharmacologic comfort measures 07/30/2023 0907 by Madalyn Rob, RN Outcome: Progressing 07/30/2023 0907 by Madalyn Rob, RN Outcome: Progressing   Problem: Health Behavior/Discharge Planning: Goal: Ability to manage health-related needs will improve 07/30/2023 0907 by Ladeja Pelham, Dewitt Rota, RN Outcome: Progressing 07/30/2023 0907 by Madalyn Rob, RN Outcome: Progressing   Problem: Clinical Measurements: Goal: Ability to maintain clinical measurements within normal limits will improve 07/30/2023 0907 by Madalyn Rob, RN Outcome: Progressing 07/30/2023 0907 by Madalyn Rob, RN Outcome: Progressing Goal: Will remain free from infection 07/30/2023 0907 by Madalyn Rob, RN Outcome: Progressing 07/30/2023 0907 by Madalyn Rob, RN Outcome: Progressing Goal: Diagnostic test results will improve 07/30/2023 0907 by Madalyn Rob, RN Outcome: Progressing 07/30/2023 0907 by Madalyn Rob, RN Outcome: Progressing Goal: Respiratory complications will improve 07/30/2023 0907 by Madalyn Rob, RN Outcome: Progressing 07/30/2023 0907 by Madalyn Rob, RN Outcome: Progressing Goal: Cardiovascular complication will be avoided 07/30/2023 8756 by Madalyn Rob, RN Outcome: Progressing 07/30/2023 0907 by Madalyn Rob, RN Outcome: Progressing   Problem: Activity: Goal: Risk for activity intolerance will decrease 07/30/2023 0907 by Madalyn Rob, RN Outcome: Progressing 07/30/2023 0907 by Madalyn Rob, RN Outcome: Progressing   Problem: Nutrition: Goal: Adequate nutrition will be maintained 07/30/2023 0907 by Madalyn Rob, RN Outcome: Progressing 07/30/2023 0907 by Madalyn Rob, RN Outcome: Progressing   Problem: Coping: Goal: Level of anxiety will decrease 07/30/2023 0907 by Madalyn Rob, RN Outcome:  Progressing 07/30/2023 0907 by Madalyn Rob, RN Outcome: Progressing   Problem: Elimination: Goal: Will not experience complications related to bowel motility 07/30/2023 0907 by Madalyn Rob, RN Outcome: Progressing 07/30/2023 0907 by Madalyn Rob, RN Outcome: Progressing Goal: Will not experience complications related to urinary retention 07/30/2023 0907 by Madalyn Rob, RN Outcome: Progressing 07/30/2023 0907 by Madalyn Rob, RN Outcome: Progressing   Problem: Pain Managment: Goal: General experience of comfort will improve and/or be controlled 07/30/2023 0907 by Madalyn Rob, RN Outcome: Progressing 07/30/2023 0907 by Madalyn Rob, RN Outcome: Progressing   Problem: Safety: Goal: Ability to remain free from injury will improve 07/30/2023 0907 by Madalyn Rob, RN Outcome: Progressing 07/30/2023 0907 by Madalyn Rob, RN Outcome: Progressing   Problem: Skin Integrity: Goal: Risk for impaired skin integrity will decrease 07/30/2023 0907 by Madalyn Rob, RN Outcome: Progressing 07/30/2023 0907 by Madalyn Rob, RN Outcome: Progressing   Problem: Education: Goal: Ability to describe self-care measures that may prevent or decrease complications (Diabetes Survival Skills Education) will improve Outcome: Progressing Goal: Individualized Educational Video(s) Outcome: Progressing   Problem: Coping: Goal: Ability to adjust to condition or change in health will improve Outcome: Progressing   Problem: Fluid Volume: Goal: Ability to maintain a balanced intake and output will improve Outcome: Progressing   Problem: Health Behavior/Discharge Planning: Goal: Ability to identify and utilize available resources and services will improve Outcome: Progressing Goal: Ability to manage health-related needs will improve Outcome: Progressing   Problem: Metabolic: Goal: Ability to maintain appropriate glucose levels will improve Outcome: Progressing   Problem:  Nutritional: Goal: Maintenance of adequate nutrition will improve Outcome: Progressing Goal: Progress toward achieving an optimal weight will improve Outcome: Progressing   Problem: Skin Integrity: Goal: Risk for impaired skin integrity will decrease Outcome: Progressing   Problem: Tissue Perfusion: Goal: Adequacy of tissue perfusion  will improve Outcome: Progressing

## 2023-07-31 ENCOUNTER — Inpatient Hospital Stay

## 2023-07-31 DIAGNOSIS — N179 Acute kidney failure, unspecified: Secondary | ICD-10-CM | POA: Diagnosis not present

## 2023-07-31 LAB — BASIC METABOLIC PANEL
Anion gap: 9 (ref 5–15)
BUN: 20 mg/dL (ref 8–23)
CO2: 29 mmol/L (ref 22–32)
Calcium: 7.7 mg/dL — ABNORMAL LOW (ref 8.9–10.3)
Chloride: 100 mmol/L (ref 98–111)
Creatinine, Ser: 1.95 mg/dL — ABNORMAL HIGH (ref 0.44–1.00)
GFR, Estimated: 26 mL/min — ABNORMAL LOW (ref >60)
Glucose, Bld: 134 mg/dL — ABNORMAL HIGH (ref 70–99)
Potassium: 3.3 mmol/L — ABNORMAL LOW (ref 3.5–5.1)
Sodium: 138 mmol/L (ref 135–145)

## 2023-07-31 LAB — PHOSPHORUS: Phosphorus: 3.3 mg/dL (ref 2.5–4.6)

## 2023-07-31 LAB — CBC
HCT: 27.7 % — ABNORMAL LOW (ref 36.0–46.0)
Hemoglobin: 9.6 g/dL — ABNORMAL LOW (ref 12.0–15.0)
MCH: 31 pg (ref 26.0–34.0)
MCHC: 34.7 g/dL (ref 30.0–36.0)
MCV: 89.4 fL (ref 80.0–100.0)
Platelets: 156 10*3/uL (ref 150–400)
RBC: 3.1 MIL/uL — ABNORMAL LOW (ref 3.87–5.11)
RDW: 15.6 % — ABNORMAL HIGH (ref 11.5–15.5)
WBC: 8.7 10*3/uL (ref 4.0–10.5)
nRBC: 0 % (ref 0.0–0.2)

## 2023-07-31 LAB — MAGNESIUM: Magnesium: 2.2 mg/dL (ref 1.7–2.4)

## 2023-07-31 LAB — GLUCOSE, CAPILLARY
Glucose-Capillary: 120 mg/dL — ABNORMAL HIGH (ref 70–99)
Glucose-Capillary: 111 mg/dL — ABNORMAL HIGH (ref 70–99)
Glucose-Capillary: 115 mg/dL — ABNORMAL HIGH (ref 70–99)
Glucose-Capillary: 115 mg/dL — ABNORMAL HIGH (ref 70–99)
Glucose-Capillary: 109 mg/dL — ABNORMAL HIGH (ref 70–99)
Glucose-Capillary: 139 mg/dL — ABNORMAL HIGH (ref 70–99)

## 2023-07-31 MED ORDER — POTASSIUM CHLORIDE CRYS ER 20 MEQ PO TBCR
40.0000 meq | EXTENDED_RELEASE_TABLET | Freq: Once | ORAL | Status: AC
  Administered 2023-07-31: 40 meq via ORAL
  Filled 2023-07-31: qty 2

## 2023-07-31 NOTE — Progress Notes (Signed)
 Brief Nutrition Follow-Up Note  Case discussed with Dr Lyn Hollingshead. Pt still with very poor oral intake, eating almost nothing. Plan to attempt to have NGT placed via fluoroscopy today (post-pyloric preferred), however, at this time, MD unsure if this can be accomplished today. RD discussed current guidelines and recommendations for nutritional management for pancreatitis for MD. While post-pyloric tube placement is preferred, gastric placement can be pursued and trialed until post-pyloric tube can be done. Plan to try to obtain fluoro placement today; nursing staff will try beside NGT placement if unable if fluro unable to place.    Intervention:  Recommend fluoroscopy guided post pyloric dobhoff tube placement and nutrition support if pt's oral intake does not improve over the next 24hrs.    Ensure Max protein supplement po BID, each supplement provides 150kcal and 30g of protein.   MVI po daily    Low fat diet    Pt at high refeed risk; recommend monitor potassium, magnesium and phosphorus labs daily until stable   Daily weights    If NGT placed, recommend:   Vital 1.5@55ml /hr- Initiate at 58ml/hr and increase by 57ml/hr q 8 hours until goal rate is reached.    Free water flushes 30ml q4 hours to maintain tube patency    Regimen provides 1980kcal/day, 89g/day of protein and 1164ml/day of free water.   Levada Schilling, RD, LDN, CDCES Registered Dietitian III Certified Diabetes Care and Education Specialist If unable to reach this RD, please use "RD Inpatient" group chat on secure chat between hours of 8am-4 pm daily

## 2023-07-31 NOTE — Procedures (Signed)
 Technically successful placement of 8 Fr Dobhoff via right nostril under intermittent fluoroscopy  Tip lies within the second portion of the duodenum Affixed to nose with tape at 39 cm Flushed easily with 20 cc sterile water  Tube is ready for immediate use.  Please see imaging section of Epic for full dictation.  Lynnette Caffey, PA-C

## 2023-07-31 NOTE — Care Management Important Message (Signed)
 Important Message  Patient Details  Name: Katherine [name] MRN: [id number] Date of Birth: 1945/06/29   Important Message Given:  Yes - Medicare IM     Cristela Blue, CMA 07/31/2023, 11:29 AM

## 2023-07-31 NOTE — Hospital Course (Addendum)
 Hospital course / significant events:   HPI: Katherine [name] is a 78 year old female with a past medical history significant for diabetes mellitus on metformin and Ozempic, hypertension, hyperlipidemia, anemia, cancer who presents to [facility] ED 07/28/2023 due to complaints of abdominal pain, nausea, vomiting, diarrhea x1 week, worsening.   03/18: admitted with Acute Pancreatitis vs. Gastroenteritis, along with Acute Kidney Injury, Hyperkalemia, and severe Anion Gap Metabolic Acidosis requiring initiation of CRRT.  03/19: renal indices are improving on HD. Persistent abd pain. Initiated CLD. 03/20: on CRRT, not eating well due to abd pain. Renal function has improved. S/p RD evaluation - recs for post-pyloric dobhoff tube placement if no improvement po intake. Cr to 1.56, UOP adequate, CRRT d/c.  03/21: transfer to hospitalist service. Still not taking any po, feed tube placement w/ postpyloric Dobhoff  03/22: titrating tube feeds. Some po intake liquids but not much. Pain about same.  03/23: po intake improving, Cr trending up 03/24: Cr still trending up again, will add IV fluids, nephrology following.  03/25: CR no significant improvement --> dialysis today. Cough and chills, (+)influenza, starting tamiflu     Consultants:  PCCU initial admission Nephrology  Radiology   Procedures/Surgeries: 07/31/23: feed tube placement w/ postpyloric Dobhoff       ASSESSMENT & PLAN:   AKI on CKD IIIb (baseline EGFR 31) ATN from prolonged nausea and vomiting.   S/p CRRT 07/28/23-07/30/23. Nephrology following Dialysis today  Monitor I&O Monitor BMP Hold nephrotoxic medications   Pancreatitis likely d/t GLP1 Rx Sepsis has been RULED OUT  Poor po intake d/t abd pain S/p feeding tube placement 07/31/23 Tube feeds per dietary recs, see orders  Advance po intake and taper tube feeds as able   Influenza Tamiflu Supportive care   Hyperkalemia d/t renal failure - resolved Monitor  BMP  Hypokalemia Replace as needed Monitor BMP  Acute anion gap metabolic acidosis - resolved Lactic Acidosis - resolved  Ozempic and metformin likely playing some role here.   Monitor BMP    Bradycardia, suspect due to Hyperkalemia - resolved Can d/c telemetry, resume metoprolol   DM2 Hypoglycemia d/t poor po intake / antihyperglycemic medications - resolved now hyperglycemic Hold home Metformin, Pioglitazone, Sitagliptin SSI   Hypothyroidism  Synthroid  A.fib  Metoprolol Hold xarelto d/t renal fxn, can use eliquis  HTN Metoprolol, amlodipine Hold lasix, ACE/ARB d/t AKI  HLD Hold statin for now     Class 1 obesity based on BMI: Body mass index is 30.56 kg/m.  Underweight - under 18  overweight - 25 to 29 obese - 30 or more Class 1 obesity: BMI of 30.0 to 34 Class 2 obesity: BMI of 35.0 to 39 Class 3 obesity: BMI of 40.0 to 49 Super Morbid Obesity: BMI 50-59 Super-super Morbid Obesity: BMI 60+ Significantly low or high BMI is associated with higher medical risk.  Weight management advised as adjunct to other disease management and risk reduction treatments    DVT prophylaxis: eliquis IV fluids: per nephrology Nutrition: tube feeds, po to advance as tolerated Central lines / other devices: none  Code Status: DNR ACP documentation reviewed:  none on file in [facility]  TOC needs: TBD, will have PT/OT assess  Medical barriers to dispo: refeeding risk. Expected medical readiness for discharge once po intake and renal function improve but unfortunately needing dialysis today and may need this permanently

## 2023-07-31 NOTE — Plan of Care (Signed)

## 2023-07-31 NOTE — Progress Notes (Signed)
 PROGRESS NOTE    Katherine [name]   WUJ:[id number] DOB: 03/18/1946  DOA: 07/28/2023 Date of Service: 07/31/23 which is hospital day 3  PCP: Dorothey Baseman, MD    Hospital course / significant events:   HPI: Katherine [name] is a 78 year old female with a past medical history significant for diabetes mellitus on metformin and Ozempic, hypertension, hyperlipidemia, anemia, cancer who presents to [facility] ED 07/28/2023 due to complaints of abdominal pain, nausea, vomiting, diarrhea x1 week, worsening.   03/18: admitted with Acute Pancreatitis vs. Gastroenteritis, along with Acute Kidney Injury, Hyperkalemia, and severe Anion Gap Metabolic Acidosis requiring initiation of CRRT.  03/19: renal indices are improving on HD. Persistent abd pain. Initiated CLD. 03/20: on CRRT, not eating well due to abd pain. Renal function has improved. S/p RD evaluation - recs for post-pyloric dobhoff tube placement if no improvement po intake. Cr to 1.56, UOP adequate, CRRT d/c.  03/21: transfer to hospitalist service. Still not taking any po, d/w IR for feed tube placement this afternoon     Consultants:  PCCU initial admission Nephrology  Radiology   Procedures/Surgeries: [Anticipate feed tube placement today 07/31/23]      ASSESSMENT & PLAN:   AKI on CKD IIIb (baseline EGFR 31) ATN from prolonged nausea and vomiting.   S/p CRRT 07/28/23-07/30/23. Nephrology following, reevaluate for further need of dialysis over the course of the hospitalization. Monitor I&O Monitor BMP  Acute anion gap metabolic acidosis.   Lactic Acidosis  Ozempic and metformin likely playing some role here.   Monitor BMP Fluids per nephrology     Pancreatitis likely d/t GLP1 Rx Sepsis has been RULED OUT  Initial lipase noted to be 810 which may have been elevated due to renal failure.  Pt not taking po d/t abd pain Feed tube placement pending   Hypokalemia Replace as needed Monitor BMP  Hyperkalemia d/t renal  failure - resolved Monitor BMP  Bradycardia, suspect due to Hyperkalemia - resolved Can d/c telemetry  DM2 Hypoglycemia d/t poor po intake / antihyperglycemic medications  Glc have normalized Glc checks  Hold home Metformin, Pioglitazone, Sitagliptin hold SSI for now while NPO  Hypothyroidism  Check TSH and thyroid panel resume home Synthroid A.fib on Xarelto HTN - BP at goal here  HLD Hold home antihypertensives for now: Amlodipine, Lasix, Lisinopril, Metoprolol Hold on statin for now     Class 1 obesity based on BMI: Body mass index is 30.56 kg/m.  Underweight - under 18  overweight - 25 to 29 obese - 30 or more Class 1 obesity: BMI of 30.0 to 34 Class 2 obesity: BMI of 35.0 to 39 Class 3 obesity: BMI of 40.0 to 49 Super Morbid Obesity: BMI 50-59 Super-super Morbid Obesity: BMI 60+ Significantly low or high BMI is associated with higher medical risk.  Weight management advised as adjunct to other disease management and risk reduction treatments    DVT prophylaxis: heparin IV fluids: per nephrology continuous IV fluids  Nutrition: none at this time plan for feed tube and enteral nutrition w/ that once placed, dietician is following Central lines / other devices: none  Code Status: DNR ACP documentation reviewed:  none on file in [facility]  TOC needs: TBD, will have PT/OT assess likely tomorrow  Medical barriers to dispo: refeeding risk. Expected medical readiness for discharge after the weekend.              Subjective / Brief ROS:  Patient reports abd pain about the same Denies CP/SOB.  Pain =  not eating Denies new weakness..  Reports no concerns w/ urination/defecation.   Family Communication: husband at bedside on rounds     Objective Findings:  Vitals:   07/30/23 2300 07/31/23 0347 07/31/23 0500 07/31/23 0810  BP: (!) 143/71 135/78  138/82  Pulse: 64 73  79  Resp: 16 16  18   Temp: (!) 97.5 F (36.4 C) (!) 97.5 F (36.4 C)  98 F (36.7  C)  TempSrc:      SpO2: 93% 91%  95%  Weight:   88.5 kg   Height:        Intake/Output Summary (Last 24 hours) at 07/31/2023 1354 Last data filed at 07/30/2023 2128 Gross per 24 hour  Intake 50 ml  Output 125 ml  Net -75 ml   Filed Weights   07/29/23 0407 07/30/23 0407 07/31/23 0500  Weight: 79.7 kg 83.1 kg 88.5 kg    Examination:  Physical Exam Constitutional:      General: She is not in acute distress. Cardiovascular:     Rate and Rhythm: Normal rate and regular rhythm.  Pulmonary:     Effort: Pulmonary effort is normal.     Breath sounds: Normal breath sounds.  Abdominal:     General: Abdomen is flat. Bowel sounds are normal.     Palpations: Abdomen is soft.     Tenderness: There is abdominal tenderness (LLQ, LUQ). There is no guarding or rebound.  Musculoskeletal:     Right lower leg: No edema.     Left lower leg: No edema.  Skin:    General: Skin is warm and dry.  Neurological:     General: No focal deficit present.     Mental Status: She is alert and oriented to person, place, and time.  Psychiatric:        Mood and Affect: Mood normal.        Behavior: Behavior normal.          Scheduled Medications:   Chlorhexidine Gluconate Cloth  6 each Topical Daily   heparin  5,000 Units Subcutaneous Q8H   metoprolol succinate  25 mg Oral Daily   multivitamin  1 tablet Oral QHS   multivitamin with minerals  1 tablet Oral Daily   pantoprazole (PROTONIX) IV  40 mg Intravenous Q12H   Ensure Max Protein  11 oz Oral BID   sodium chloride flush  10-40 mL Intracatheter Q12H    Continuous Infusions:    PRN Medications:  docusate sodium, HYDROmorphone (DILAUDID) injection, ondansetron (ZOFRAN) IV, mouth rinse, oxyCODONE-acetaminophen, polyethylene glycol, sodium chloride flush  Antimicrobials from admission:  Anti-infectives (From admission, onward)    Start     Dose/Rate Route Frequency Ordered Stop   07/30/23 2200  piperacillin-tazobactam (ZOSYN) IVPB 3.375  g  Status:  Discontinued        3.375 g 12.5 mL/hr over 240 Minutes Intravenous Every 8 hours 07/30/23 1621 07/31/23 1132   07/29/23 0000  piperacillin-tazobactam (ZOSYN) IVPB 3.375 g  Status:  Discontinued        3.375 g 100 mL/hr over 30 Minutes Intravenous Every 6 hours 07/28/23 1704 07/30/23 1621   07/28/23 1400  piperacillin-tazobactam (ZOSYN) IVPB 2.25 g  Status:  Discontinued        2.25 g 100 mL/hr over 30 Minutes Intravenous Every 8 hours 07/28/23 1336 07/28/23 1704           Data Reviewed:  I have personally reviewed the following...  CBC: Recent Labs  Lab 07/28/23 1120 07/29/23 0423  07/30/23 0429 07/31/23 0457  WBC 8.4 6.7 7.8 8.7  HGB 12.7 10.3* 9.7* 9.6*  HCT 37.0 29.2* 27.9* 27.7*  MCV 95.1 88.5 90.9 89.4  PLT 378 273 168 156   Basic Metabolic Panel: Recent Labs  Lab 07/29/23 0029 07/29/23 0423 07/29/23 1621 07/30/23 0429 07/31/23 0457  NA 134* 135 135 138 138  K 5.2* 4.3 3.8 3.9 3.3*  CL 98 98 101 101 100  CO2 17* 22 27 29 29   GLUCOSE 180* 100* 89 123* 134*  BUN 85* 63* 22 13 20   CREATININE 7.13* 5.28* 2.00* 1.56* 1.95*  CALCIUM 9.9 10.4* 9.3 8.5* 7.7*  MG  --  2.1  --  2.2 2.2  PHOS 5.7* 4.4 1.9* 2.0* 3.3   GFR: Estimated Creatinine Clearance: 28.5 mL/min (A) (by C-G formula based on SCr of 1.95 mg/dL (H)). Liver Function Tests: Recent Labs  Lab 07/28/23 1120 07/29/23 0029 07/29/23 0423 07/29/23 1621 07/30/23 0429  AST 33  --  21  --   --   ALT 15  --  15  --   --   ALKPHOS 29*  --  24*  --   --   BILITOT 1.1  --  0.9  --   --   PROT 7.3  --  5.8*  --   --   ALBUMIN 3.9 3.0* 3.1*  3.2* 2.8* 2.8*   Recent Labs  Lab 07/28/23 1120 07/29/23 0423  LIPASE 810* 617*   No results for input(s): "AMMONIA" in the last 168 hours. Coagulation Profile: No results for input(s): "INR", "PROTIME" in the last 168 hours. Cardiac Enzymes: No results for input(s): "CKTOTAL", "CKMB", "CKMBINDEX", "TROPONINI" in the last 168 hours. BNP (last 3  results) No results for input(s): "PROBNP" in the last 8760 hours. HbA1C: No results for input(s): "HGBA1C" in the last 72 hours. CBG: Recent Labs  Lab 07/30/23 2304 07/31/23 0434 07/31/23 0813 07/31/23 1142 07/31/23 1207  GLUCAP 131* 120* 111* 115* 115*   Lipid Profile: No results for input(s): "CHOL", "HDL", "LDLCALC", "TRIG", "CHOLHDL", "LDLDIRECT" in the last 72 hours.  Thyroid Function Tests: Recent Labs    07/28/23 1728  TSH 2.860  T4TOTAL 6.1   Anemia Panel: No results for input(s): "VITAMINB12", "FOLATE", "FERRITIN", "TIBC", "IRON", "RETICCTPCT" in the last 72 hours. Most Recent Urinalysis On File:     Component Value Date/Time   COLORURINE STRAW (A) 07/29/2023 0528   APPEARANCEUR CLEAR (A) 07/29/2023 0528   LABSPEC 1.011 07/29/2023 0528   PHURINE 5.0 07/29/2023 0528   GLUCOSEU 150 (A) 07/29/2023 0528   HGBUR SMALL (A) 07/29/2023 0528   BILIRUBINUR NEGATIVE 07/29/2023 0528   KETONESUR 5 (A) 07/29/2023 0528   PROTEINUR 30 (A) 07/29/2023 0528   NITRITE NEGATIVE 07/29/2023 0528   LEUKOCYTESUR NEGATIVE 07/29/2023 0528   Sepsis Labs: @LABRCNTIP (procalcitonin:4,lacticidven:4) Microbiology: Recent Results (from the past 240 hours)  MRSA Next Gen by PCR, Nasal     Status: None   Collection Time: 07/28/23  2:30 PM   Specimen: Nasal Mucosa; Nasal Swab  Result Value Ref Range Status   MRSA by PCR Next Gen NOT DETECTED NOT DETECTED Final    Comment: (NOTE) The GeneXpert MRSA Assay (FDA approved for NASAL specimens only), is one component of a comprehensive MRSA colonization surveillance program. It is not intended to diagnose MRSA infection nor to guide or monitor treatment for MRSA infections. Test performance is not FDA approved in patients less than 70 years old. Performed at [facility], [address] [place], [place] [address]   Culture, blood (Routine X 2) w Reflex to ID Panel     Status: None (Preliminary result)   Collection Time: 07/28/23   3:32 PM   Specimen: BLOOD  Result Value Ref Range Status   Specimen Description BLOOD BLOOD LEFT WRIST  Final   Special Requests   Final    BOTTLES DRAWN AEROBIC ONLY Blood Culture results may not be optimal due to an inadequate volume of blood received in culture bottles   Culture   Final    NO GROWTH 3 DAYS Performed at [facility], [address] [place], [place] [address]    Report Status PENDING  Incomplete  Culture, blood (Routine X 2) w Reflex to ID Panel     Status: None (Preliminary result)   Collection Time: 07/28/23  3:42 PM   Specimen: BLOOD  Result Value Ref Range Status   Specimen Description BLOOD BLOOD RIGHT HAND  Final   Special Requests   Final    BOTTLES DRAWN AEROBIC AND ANAEROBIC Blood Culture results may not be optimal due to an inadequate volume of blood received in culture bottles   Culture   Final    NO GROWTH 3 DAYS Performed at [facility], [address] [place], [place] [address]    Report Status PENDING  Incomplete  Gastrointestinal Panel by PCR , Stool     Status: None   Collection Time: 07/28/23 10:29 PM   Specimen: Stool  Result Value Ref Range Status   Campylobacter species NOT DETECTED NOT DETECTED Corrected    Comment: CORRECTED ON 03/19 AT 0238: PREVIOUSLY REPORTED AS NONE DETECTED   Plesimonas shigelloides NOT DETECTED NOT DETECTED Corrected    Comment: CORRECTED ON 03/19 AT 0238: PREVIOUSLY REPORTED AS NONE DETECTED   Salmonella species NOT DETECTED NOT DETECTED Corrected    Comment: CORRECTED ON 03/19 AT 0238: PREVIOUSLY REPORTED AS NONE DETECTED   Yersinia enterocolitica NOT DETECTED NOT DETECTED Corrected    Comment: CORRECTED ON 03/19 AT 0238: PREVIOUSLY REPORTED AS NONE DETECTED   Vibrio species NOT DETECTED NOT DETECTED Corrected    Comment: CORRECTED ON 03/19 AT 0238: PREVIOUSLY REPORTED AS NONE DETECTED   Vibrio cholerae NOT DETECTED NOT DETECTED Corrected    Comment: CORRECTED ON 03/19 AT 0238: PREVIOUSLY  REPORTED AS NONE DETECTED   Enteroaggregative E coli (EAEC) NOT DETECTED NOT DETECTED Corrected    Comment: CORRECTED ON 03/19 AT 0238: PREVIOUSLY REPORTED AS NONE DETECTED   Enteropathogenic E coli (EPEC) NOT DETECTED NOT DETECTED Corrected    Comment: CORRECTED ON 03/19 AT 0238: PREVIOUSLY REPORTED AS NONE DETECTED   Enterotoxigenic E coli (ETEC) NOT DETECTED NOT DETECTED Corrected    Comment: CORRECTED ON 03/19 AT 0238: PREVIOUSLY REPORTED AS NONE DETECTED   Shiga like toxin producing E coli (STEC) NOT DETECTED NOT DETECTED Corrected    Comment: CORRECTED ON 03/19 AT 0238: PREVIOUSLY REPORTED AS NONE DETECTED   E. coli O157 NOT DETECTED NOT DETECTED Corrected    Comment: CORRECTED ON 03/19 AT 0238: PREVIOUSLY REPORTED AS NONE DETECTED   Shigella/Enteroinvasive E coli (EIEC) NOT DETECTED NOT DETECTED Corrected    Comment: CORRECTED ON 03/19 AT 0238: PREVIOUSLY REPORTED AS NONE DETECTED   Cryptosporidium NOT DETECTED NOT DETECTED Corrected    Comment: CORRECTED ON 03/19 AT 0238: PREVIOUSLY REPORTED AS NONE DETECTED   Cyclospora cayetanensis NOT DETECTED NOT DETECTED Corrected    Comment: CORRECTED ON 03/19 AT 0238: PREVIOUSLY REPORTED AS NONE DETECTED   Entamoeba  histolytica NOT DETECTED NOT DETECTED Corrected    Comment: CORRECTED ON 03/19 AT 0238: PREVIOUSLY REPORTED AS NONE DETECTED   Giardia lamblia NOT DETECTED NOT DETECTED Corrected    Comment: CORRECTED ON 03/19 AT 0238: PREVIOUSLY REPORTED AS NONE DETECTED   Adenovirus F40/41 NOT DETECTED NOT DETECTED Corrected    Comment: CORRECTED ON 03/19 AT 0238: PREVIOUSLY REPORTED AS NONE DETECTED   Astrovirus NOT DETECTED NOT DETECTED Corrected    Comment: CORRECTED ON 03/19 AT 7253: PREVIOUSLY REPORTED AS NONE DETECTED   Norovirus GI/GII NOT DETECTED NOT DETECTED Corrected    Comment: CORRECTED ON 03/19 AT 0238: PREVIOUSLY REPORTED AS NONE DETECTED   Rotavirus A NOT DETECTED NOT DETECTED Corrected    Comment: CORRECTED ON 03/19 AT 0238:  PREVIOUSLY REPORTED AS NONE DETECTED   Sapovirus (I, II, IV, and V) NOT DETECTED NOT DETECTED Corrected    Comment: Performed at [facility], [address] [place], [place] [address] CORRECTED ON 03/19 AT 3474: PREVIOUSLY REPORTED AS NONE DETECTED   C Difficile Quick Screen w PCR reflex     Status: None   Collection Time: 07/28/23 10:29 PM   Specimen: STOOL  Result Value Ref Range Status   C Diff antigen NEGATIVE NEGATIVE Final   C Diff toxin NEGATIVE NEGATIVE Final   C Diff interpretation No C. difficile detected.  Final    Comment: Performed at [facility], [address] [place], [place] [address]      Radiology Studies last 3 days: CT ABDOMEN PELVIS WO CONTRAST Result Date: 07/28/2023 CLINICAL DATA:  Vomiting for 1 week. EXAM: CT ABDOMEN AND PELVIS WITHOUT CONTRAST TECHNIQUE: Multidetector CT imaging of the abdomen and pelvis was performed following the standard protocol without IV contrast. RADIATION DOSE REDUCTION: This exam was performed according to the departmental dose-optimization program which includes automated exposure control, adjustment of the mA and/or kV according to patient size and/or use of iterative reconstruction technique. COMPARISON:  April 30, 2021. FINDINGS: Lower chest: No acute abnormality. Hepatobiliary: No focal liver abnormality is seen. Status post cholecystectomy. No biliary dilatation. Pancreas: Some degree of fatty replacement of the pancreas is noted. Mild inflammatory changes are noted around the pancreas. No definite pseudocyst formation is noted. Spleen: Normal in size without focal abnormality. Adrenals/Urinary Tract: Adrenal glands are unremarkable. Kidneys are normal, without renal calculi, focal lesion, or hydronephrosis. Bladder is unremarkable. Stomach/Bowel: Stomach is unremarkable. The appendix appears normal. There is no evidence of bowel obstruction. Colon is unremarkable. However, there is interval development of  moderate wall thickening of the duodenum and proximal jejunum with surrounding inflammatory changes. It is uncertain if this represents primary inflammation of the bowel or peptic ulcer disease, or if this is secondary to pancreatitis. Vascular/Lymphatic: Aortic atherosclerosis. No enlarged abdominal or pelvic lymph nodes. Reproductive: Uterus and bilateral adnexa are unremarkable. Other: No ascites or hernia is noted. Musculoskeletal: Stable old L2 compression fracture. No acute osseous abnormality is noted. IMPRESSION: Moderate wall thickening of the duodenum and proximal jejunum is noted with surrounding inflammatory changes. This is concerning for severe enteritis or possibly peptic ulcer disease although the possibility of this being secondary to some degree of pancreatitis cannot be excluded. Clinical correlation is recommended. Aortic Atherosclerosis (ICD10-I70.0). Electronically Signed   By: Lupita Raider M.D.   On: 07/28/2023 15:18         Sunnie Nielsen, DO [facility] 07/31/2023, 1:54 PM    Dictation software may have been used to generate the above note. Typos may  occur and escape review in typed/dictated notes. Please contact Dr Lyn Hollingshead directly for clarity if needed.  Staff may message me via secure chat in Epic  but this may not receive an immediate response,  please page me for urgent matters!  If 7PM-7AM, please contact night coverage [email or web address]

## 2023-07-31 NOTE — Progress Notes (Signed)
 [facility]  ROUNDING NOTE   Subjective:   Alert and oriented Room air States she feels well, tolerating meals  Creatinine slightly elevated today Questionable urine output, patient states she has urinated  03/20 0701 - 03/21 0700 In: 190 [P.O.:30; IV Piggyback:50] Out: 200 [Urine:200] Lab Results  Component Value Date   CREATININE 1.95 (H) 07/31/2023   CREATININE 1.56 (H) 07/30/2023   CREATININE 2.00 (H) 07/29/2023     Objective:  Vital signs in last 24 hours:  Temp:  [97.5 F (36.4 C)-98 F (36.7 C)] 98 F (36.7 C) (03/21 0810) Pulse Rate:  [62-79] 79 (03/21 0810) Resp:  [11-23] 18 (03/21 0810) BP: (135-151)/(60-82) 138/82 (03/21 0810) SpO2:  [91 %-99 %] 95 % (03/21 0810) Weight:  [88.5 kg] 88.5 kg (03/21 0500)  Weight change: 5.4 kg Filed Weights   07/29/23 0407 07/30/23 0407 07/31/23 0500  Weight: 79.7 kg 83.1 kg 88.5 kg    Intake/Output: I/O last 3 completed shifts: In: 370 [P.O.:110; Other:110; IV Piggyback:150] Out: 619 [Urine:525]   Intake/Output this shift:  Total I/O In: 240 [P.O.:240] Out: -   Physical Exam: General: No acute distress  Head: Normocephalic, atraumatic. Moist oral mucosal membranes  Neck: Supple  Lungs:  Clear to auscultation, normal effort  Heart: S1S2 no rubs  Abdomen:  Soft, nontender, bowel sounds present  Extremities: Trace peripheral edema.  Neurologic: Awake, alert, following commands  Skin: No acute rash  Access: Right femoral dialysis catheter    Basic Metabolic Panel: Recent Labs  Lab 07/29/23 0029 07/29/23 0423 07/29/23 1621 07/30/23 0429 07/31/23 0457  NA 134* 135 135 138 138  K 5.2* 4.3 3.8 3.9 3.3*  CL 98 98 101 101 100  CO2 17* 22 27 29 29   GLUCOSE 180* 100* 89 123* 134*  BUN 85* 63* 22 13 20   CREATININE 7.13* 5.28* 2.00* 1.56* 1.95*  CALCIUM 9.9 10.4* 9.3 8.5* 7.7*  MG  --  2.1  --  2.2 2.2  PHOS 5.7* 4.4 1.9* 2.0* 3.3    Liver Function Tests: Recent Labs  Lab 07/28/23 1120  07/29/23 0029 07/29/23 0423 07/29/23 1621 07/30/23 0429  AST 33  --  21  --   --   ALT 15  --  15  --   --   ALKPHOS 29*  --  24*  --   --   BILITOT 1.1  --  0.9  --   --   PROT 7.3  --  5.8*  --   --   ALBUMIN 3.9 3.0* 3.1*  3.2* 2.8* 2.8*   Recent Labs  Lab 07/28/23 1120 07/29/23 0423  LIPASE 810* 617*   No results for input(s): "AMMONIA" in the last 168 hours.  CBC: Recent Labs  Lab 07/28/23 1120 07/29/23 0423 07/30/23 0429 07/31/23 0457  WBC 8.4 6.7 7.8 8.7  HGB 12.7 10.3* 9.7* 9.6*  HCT 37.0 29.2* 27.9* 27.7*  MCV 95.1 88.5 90.9 89.4  PLT 378 273 168 156    Cardiac Enzymes: No results for input(s): "CKTOTAL", "CKMB", "CKMBINDEX", "TROPONINI" in the last 168 hours.  BNP: Invalid input(s): "POCBNP"  CBG: Recent Labs  Lab 07/30/23 2304 07/31/23 0434 07/31/23 0813 07/31/23 1142 07/31/23 1207  GLUCAP 131* 120* 111* 115* 115*    Microbiology: Results for orders placed or performed during the hospital encounter of 07/28/23  MRSA Next Gen by PCR, Nasal     Status: None   Collection Time: 07/28/23  2:30 PM   Specimen: Nasal Mucosa; Nasal  Swab  Result Value Ref Range Status   MRSA by PCR Next Gen NOT DETECTED NOT DETECTED Final    Comment: (NOTE) The GeneXpert MRSA Assay (FDA approved for NASAL specimens only), is one component of a comprehensive MRSA colonization surveillance program. It is not intended to diagnose MRSA infection nor to guide or monitor treatment for MRSA infections. Test performance is not FDA approved in patients less than 91 years old. Performed at [facility], [address] [place], [place] [address]   Culture, blood (Routine X 2) w Reflex to ID Panel     Status: None (Preliminary result)   Collection Time: 07/28/23  3:32 PM   Specimen: BLOOD  Result Value Ref Range Status   Specimen Description BLOOD BLOOD LEFT WRIST  Final   Special Requests   Final    BOTTLES DRAWN AEROBIC ONLY Blood Culture results may not be  optimal due to an inadequate volume of blood received in culture bottles   Culture   Final    NO GROWTH 3 DAYS Performed at [facility], [address] [place], [place] [address]    Report Status PENDING  Incomplete  Culture, blood (Routine X 2) w Reflex to ID Panel     Status: None (Preliminary result)   Collection Time: 07/28/23  3:42 PM   Specimen: BLOOD  Result Value Ref Range Status   Specimen Description BLOOD BLOOD RIGHT HAND  Final   Special Requests   Final    BOTTLES DRAWN AEROBIC AND ANAEROBIC Blood Culture results may not be optimal due to an inadequate volume of blood received in culture bottles   Culture   Final    NO GROWTH 3 DAYS Performed at [facility], [address] [place], [place] [address]    Report Status PENDING  Incomplete  Gastrointestinal Panel by PCR , Stool     Status: None   Collection Time: 07/28/23 10:29 PM   Specimen: Stool  Result Value Ref Range Status   Campylobacter species NOT DETECTED NOT DETECTED Corrected    Comment: CORRECTED ON 03/19 AT 0238: PREVIOUSLY REPORTED AS NONE DETECTED   Plesimonas shigelloides NOT DETECTED NOT DETECTED Corrected    Comment: CORRECTED ON 03/19 AT 0238: PREVIOUSLY REPORTED AS NONE DETECTED   Salmonella species NOT DETECTED NOT DETECTED Corrected    Comment: CORRECTED ON 03/19 AT 0238: PREVIOUSLY REPORTED AS NONE DETECTED   Yersinia enterocolitica NOT DETECTED NOT DETECTED Corrected    Comment: CORRECTED ON 03/19 AT 0238: PREVIOUSLY REPORTED AS NONE DETECTED   Vibrio species NOT DETECTED NOT DETECTED Corrected    Comment: CORRECTED ON 03/19 AT 0238: PREVIOUSLY REPORTED AS NONE DETECTED   Vibrio cholerae NOT DETECTED NOT DETECTED Corrected    Comment: CORRECTED ON 03/19 AT 0238: PREVIOUSLY REPORTED AS NONE DETECTED   Enteroaggregative E coli (EAEC) NOT DETECTED NOT DETECTED Corrected    Comment: CORRECTED ON 03/19 AT 0238: PREVIOUSLY REPORTED AS NONE DETECTED   Enteropathogenic E coli  (EPEC) NOT DETECTED NOT DETECTED Corrected    Comment: CORRECTED ON 03/19 AT 0238: PREVIOUSLY REPORTED AS NONE DETECTED   Enterotoxigenic E coli (ETEC) NOT DETECTED NOT DETECTED Corrected    Comment: CORRECTED ON 03/19 AT 0238: PREVIOUSLY REPORTED AS NONE DETECTED   Shiga like toxin producing E coli (STEC) NOT DETECTED NOT DETECTED Corrected    Comment: CORRECTED ON 03/19 AT 0238: PREVIOUSLY REPORTED AS NONE DETECTED   E. coli O157 NOT DETECTED NOT DETECTED Corrected    Comment: CORRECTED  ON 03/19 AT 0238: PREVIOUSLY REPORTED AS NONE DETECTED   Shigella/Enteroinvasive E coli (EIEC) NOT DETECTED NOT DETECTED Corrected    Comment: CORRECTED ON 03/19 AT 0238: PREVIOUSLY REPORTED AS NONE DETECTED   Cryptosporidium NOT DETECTED NOT DETECTED Corrected    Comment: CORRECTED ON 03/19 AT 0238: PREVIOUSLY REPORTED AS NONE DETECTED   Cyclospora cayetanensis NOT DETECTED NOT DETECTED Corrected    Comment: CORRECTED ON 03/19 AT 0238: PREVIOUSLY REPORTED AS NONE DETECTED   Entamoeba histolytica NOT DETECTED NOT DETECTED Corrected    Comment: CORRECTED ON 03/19 AT 0238: PREVIOUSLY REPORTED AS NONE DETECTED   Giardia lamblia NOT DETECTED NOT DETECTED Corrected    Comment: CORRECTED ON 03/19 AT 0238: PREVIOUSLY REPORTED AS NONE DETECTED   Adenovirus F40/41 NOT DETECTED NOT DETECTED Corrected    Comment: CORRECTED ON 03/19 AT 0238: PREVIOUSLY REPORTED AS NONE DETECTED   Astrovirus NOT DETECTED NOT DETECTED Corrected    Comment: CORRECTED ON 03/19 AT 0238: PREVIOUSLY REPORTED AS NONE DETECTED   Norovirus GI/GII NOT DETECTED NOT DETECTED Corrected    Comment: CORRECTED ON 03/19 AT 0238: PREVIOUSLY REPORTED AS NONE DETECTED   Rotavirus A NOT DETECTED NOT DETECTED Corrected    Comment: CORRECTED ON 03/19 AT 0238: PREVIOUSLY REPORTED AS NONE DETECTED   Sapovirus (I, II, IV, and V) NOT DETECTED NOT DETECTED Corrected    Comment: Performed at [facility], [address] [place], [place]  [address] CORRECTED ON 03/19 AT 6213: PREVIOUSLY REPORTED AS NONE DETECTED   C Difficile Quick Screen w PCR reflex     Status: None   Collection Time: 07/28/23 10:29 PM   Specimen: STOOL  Result Value Ref Range Status   C Diff antigen NEGATIVE NEGATIVE Final   C Diff toxin NEGATIVE NEGATIVE Final   C Diff interpretation No C. difficile detected.  Final    Comment: Performed at [facility], [address] [place], [place] [address]    Coagulation Studies: No results for input(s): "LABPROT", "INR" in the last 72 hours.  Urinalysis: Recent Labs    07/29/23 0528  COLORURINE STRAW*  LABSPEC 1.011  PHURINE 5.0  GLUCOSEU 150*  HGBUR SMALL*  BILIRUBINUR NEGATIVE  KETONESUR 5*  PROTEINUR 30*  NITRITE NEGATIVE  LEUKOCYTESUR NEGATIVE      Imaging: No results found.    Medications:      Chlorhexidine Gluconate Cloth  6 each Topical Daily   heparin  5,000 Units Subcutaneous Q8H   metoprolol succinate  25 mg Oral Daily   multivitamin  1 tablet Oral QHS   multivitamin with minerals  1 tablet Oral Daily   pantoprazole (PROTONIX) IV  40 mg Intravenous Q12H   Ensure Max Protein  11 oz Oral BID   sodium chloride flush  10-40 mL Intracatheter Q12H   docusate sodium, HYDROmorphone (DILAUDID) injection, ondansetron (ZOFRAN) IV, mouth rinse, oxyCODONE-acetaminophen, polyethylene glycol, sodium chloride flush  Assessment/ Plan:  78 y.o. female : Pt is a 78 y.o. female with a PMHx of hypothyroidism, diabetes mellitus type 2 treated with Ozempic and metformin, hypertension, hyperlipidemia, atrial fibrillation, basal cell carcinoma, cystocele, depression, glaucoma, urinary incontinence who was admitted to [facility] on 07/28/2023 for evaluation of nausea, vomiting, loose stools, and abdominal pain.   1.  Acute kidney injury/chronic kidney disease stage IIIb baseline EGFR 31/diabetes mellitus type 2 with chronic kidney disease/hyperkalemia.  Patient admitted with severe acute kidney  injury.  Suspect development of ATN from prolonged nausea and vomiting.  CRRT stopped 07/30/2023.  Update: Creatinine slightly  increased, 1.95.  Questionable urine output.  Did request Foley catheter placement for accurate I's and O's.  Will continue to monitor renal indices with urine output to determine further need of hemodialysis.  2.  Acute metabolic acidosis.  Ozempic and metformin likely playing some role here.  Serum bicarbonate improved   3.  Pancreatitis.  Initial lipase noted to be 810.  CT scan abdomen pelvis reviewed.  Lipase may have been elevated due to renal failure.   LOS: 3 Rosevelt Luu 3/21/20253:35 PM

## 2023-07-31 NOTE — Progress Notes (Signed)
 PHARMACY CONSULT NOTE - FOLLOW UP  Pharmacy Consult for Electrolyte Monitoring and Replacement   Recent Labs: Potassium (mmol/L)  Date Value  07/31/2023 3.3 (L)   Magnesium (mg/dL)  Date Value  16/02/9603 2.2   Calcium (mg/dL)  Date Value  54/01/8118 7.7 (L)   Albumin (g/dL)  Date Value  14/78/2956 2.8 (L)   Phosphorus (mg/dL)  Date Value  21/30/8657 3.3   Sodium (mmol/L)  Date Value  07/31/2023 138   Assessment: 78 y.o. female admitted with Acute Pancreatitis, along with Acute Kidney Injury, Hyperkalemia, and severe Anion Gap Metabolic Acidosis requiring initiation of renal replacement therapy.   Goal of Therapy:  Electrolytes WNL  Plan:  - K = 3.3, order Kcl 40 mEq po x 1 - Nephro following, BMP BID ordered - Electrolyte management was per CCM consult, since patient has moved out of ICU will sign-off consult.  Barrie Folk ,PharmD Clinical Pharmacist 07/31/2023 7:23 AM

## 2023-07-31 NOTE — Plan of Care (Signed)
  Problem: Education: Goal: Knowledge of General Education information will improve Description: Including pain rating scale, medication(s)/side effects and non-pharmacologic comfort measures Outcome: Progressing   Problem: Clinical Measurements: Goal: Cardiovascular complication will be avoided Outcome: Progressing   Problem: Skin Integrity: Goal: Risk for impaired skin integrity will decrease Outcome: Progressing

## 2023-08-01 ENCOUNTER — Encounter: Payer: Self-pay | Admitting: Pulmonary Disease

## 2023-08-01 ENCOUNTER — Inpatient Hospital Stay
Admit: 2023-08-01 | Discharge: 2023-08-01 | Disposition: A | Discharge status: Home / Self Care | Attending: Critical Care Medicine

## 2023-08-01 DIAGNOSIS — N179 Acute kidney failure, unspecified: Secondary | ICD-10-CM | POA: Diagnosis not present

## 2023-08-01 LAB — BASIC METABOLIC PANEL
Anion gap: 10 (ref 5–15)
BUN: 27 mg/dL — ABNORMAL HIGH (ref 8–23)
CO2: 23 mmol/L (ref 22–32)
Calcium: 7.7 mg/dL — ABNORMAL LOW (ref 8.9–10.3)
Chloride: 100 mmol/L (ref 98–111)
Creatinine, Ser: 2.95 mg/dL — ABNORMAL HIGH (ref 0.44–1.00)
GFR, Estimated: 16 mL/min — ABNORMAL LOW (ref >60)
Glucose, Bld: 189 mg/dL — ABNORMAL HIGH (ref 70–99)
Potassium: 3.8 mmol/L (ref 3.5–5.1)
Sodium: 133 mmol/L — ABNORMAL LOW (ref 135–145)

## 2023-08-01 LAB — CBC
HCT: 29.5 % — ABNORMAL LOW (ref 36.0–46.0)
Hemoglobin: 10.2 g/dL — ABNORMAL LOW (ref 12.0–15.0)
MCH: 31 pg (ref 26.0–34.0)
MCHC: 34.6 g/dL (ref 30.0–36.0)
MCV: 89.7 fL (ref 80.0–100.0)
Platelets: 172 10*3/uL (ref 150–400)
RBC: 3.29 MIL/uL — ABNORMAL LOW (ref 3.87–5.11)
RDW: 15.4 % (ref 11.5–15.5)
WBC: 9.2 10*3/uL (ref 4.0–10.5)
nRBC: 0 % (ref 0.0–0.2)

## 2023-08-01 LAB — GLUCOSE, CAPILLARY
Glucose-Capillary: 150 mg/dL — ABNORMAL HIGH (ref 70–99)
Glucose-Capillary: 163 mg/dL — ABNORMAL HIGH (ref 70–99)
Glucose-Capillary: 196 mg/dL — ABNORMAL HIGH (ref 70–99)
Glucose-Capillary: 236 mg/dL — ABNORMAL HIGH (ref 70–99)
Glucose-Capillary: 311 mg/dL — ABNORMAL HIGH (ref 70–99)

## 2023-08-01 LAB — ECHOCARDIOGRAM COMPLETE
AR max vel: 2.56 cm2
AV Peak grad: 7 mmHg
Ao pk vel: 1.33 m/s
Height: 67 in
S' Lateral: 2.9 cm
Weight: 3115.2 [oz_av]

## 2023-08-01 LAB — HEMOGLOBIN A1C
Hgb A1c MFr Bld: 6 % — ABNORMAL HIGH (ref 4.8–5.6)
Mean Plasma Glucose: 125.5 mg/dL

## 2023-08-01 MED ORDER — INSULIN ASPART 100 UNIT/ML IJ SOLN
2.0000 [IU] | Freq: Three times a day (TID) | INTRAMUSCULAR | Status: DC
  Administered 2023-08-01 – 2023-08-02 (×2): 2 [IU] via SUBCUTANEOUS
  Filled 2023-08-01 (×2): qty 1

## 2023-08-01 MED ORDER — INSULIN ASPART 100 UNIT/ML IJ SOLN: 0.0000 [IU] | Freq: Every day | INTRAMUSCULAR | Status: DC

## 2023-08-01 MED ORDER — VITAL 1.5 CAL PO LIQD
1000.0000 mL | ORAL | Status: DC
  Administered 2023-08-01: 1000 mL

## 2023-08-01 MED ORDER — INSULIN ASPART 100 UNIT/ML IJ SOLN
0.0000 [IU] | Freq: Three times a day (TID) | INTRAMUSCULAR | Status: DC
  Administered 2023-08-01 – 2023-08-02 (×2): 7 [IU] via SUBCUTANEOUS
  Filled 2023-08-01 (×2): qty 1

## 2023-08-01 MED ORDER — FREE WATER
100.0000 mL | Status: DC
  Administered 2023-08-01 – 2023-08-05 (×25): 100 mL

## 2023-08-01 MED ORDER — VITAL 1.5 CAL PO LIQD
1000.0000 mL | ORAL | Status: DC
  Administered 2023-08-01 – 2023-08-02 (×3): 1000 mL

## 2023-08-01 NOTE — Plan of Care (Signed)
  Problem: Education: Goal: Knowledge of General Education information will improve Description: Including pain rating scale, medication(s)/side effects and non-pharmacologic comfort measures Outcome: Progressing   Problem: Health Behavior/Discharge Planning: Goal: Ability to manage health-related needs will improve Outcome: Progressing   Problem: Clinical Measurements: Goal: Ability to maintain clinical measurements within normal limits will improve Outcome: Progressing Goal: Will remain free from infection Outcome: Progressing Goal: Diagnostic test results will improve Outcome: Progressing Goal: Respiratory complications will improve Outcome: Progressing Goal: Cardiovascular complication will be avoided Outcome: Progressing   Problem: Nutrition: Goal: Adequate nutrition will be maintained Outcome: Progressing   Problem: Coping: Goal: Level of anxiety will decrease Outcome: Progressing   Problem: Elimination: Goal: Will not experience complications related to bowel motility Outcome: Progressing Goal: Will not experience complications related to urinary retention Outcome: Progressing   Problem: Pain Managment: Goal: General experience of comfort will improve and/or be controlled Outcome: Progressing   Problem: Safety: Goal: Ability to remain free from injury will improve Outcome: Progressing   Problem: Skin Integrity: Goal: Risk for impaired skin integrity will decrease Outcome: Progressing   Problem: Education: Goal: Ability to describe self-care measures that may prevent or decrease complications (Diabetes Survival Skills Education) will improve Outcome: Progressing Goal: Individualized Educational Video(s) Outcome: Progressing   Problem: Coping: Goal: Ability to adjust to condition or change in health will improve Outcome: Progressing   Problem: Fluid Volume: Goal: Ability to maintain a balanced intake and output will improve Outcome: Progressing    Problem: Health Behavior/Discharge Planning: Goal: Ability to identify and utilize available resources and services will improve Outcome: Progressing Goal: Ability to manage health-related needs will improve Outcome: Progressing   Problem: Metabolic: Goal: Ability to maintain appropriate glucose levels will improve Outcome: Progressing   Problem: Nutritional: Goal: Maintenance of adequate nutrition will improve Outcome: Progressing Goal: Progress toward achieving an optimal weight will improve Outcome: Progressing   Problem: Skin Integrity: Goal: Risk for impaired skin integrity will decrease Outcome: Progressing   Problem: Tissue Perfusion: Goal: Adequacy of tissue perfusion will improve Outcome: Progressing

## 2023-08-01 NOTE — Progress Notes (Signed)
 PROGRESS NOTE    Katherine [name]   ZOX:[id number] DOB: Oct 02, 1945  DOA: 07/28/2023 Date of Service: 08/01/23 which is hospital day 4  PCP: Dorothey Baseman, MD    Hospital course / significant events:   HPI: Katherine [name] is a 78 year old female with a past medical history significant for diabetes mellitus on metformin and Ozempic, hypertension, hyperlipidemia, anemia, cancer who presents to [facility] ED 07/28/2023 due to complaints of abdominal pain, nausea, vomiting, diarrhea x1 week, worsening.   03/18: admitted with Acute Pancreatitis vs. Gastroenteritis, along with Acute Kidney Injury, Hyperkalemia, and severe Anion Gap Metabolic Acidosis requiring initiation of CRRT.  03/19: renal indices are improving on HD. Persistent abd pain. Initiated CLD. 03/20: on CRRT, not eating well due to abd pain. Renal function has improved. S/p RD evaluation - recs for post-pyloric dobhoff tube placement if no improvement po intake. Cr to 1.56, UOP adequate, CRRT d/c.  03/21: transfer to hospitalist service. Still not taking any po, feed tube placement w/ postpyloric Dobhoff  03/22: initiate tube feeds.     Consultants:  PCCU initial admission Nephrology  Radiology   Procedures/Surgeries: 07/31/23: feed tube placement w/ postpyloric Dobhoff       ASSESSMENT & PLAN:   AKI on CKD IIIb (baseline EGFR 31) ATN from prolonged nausea and vomiting.   S/p CRRT 07/28/23-07/30/23. Nephrology following, reevaluate for further need of dialysis over the course of the hospitalization. Monitor I&O Monitor BMP  Pancreatitis likely d/t GLP1 Rx Sepsis has been RULED OUT  Initial lipase noted to be 810 which may have been elevated due to renal failure.  Pt not taking po d/t abd pain Feed tube placement pending   Poor po intake d/t abd pain S/p feeding tube placement 07/31/23 Tube feeds per dietary recs, see orders   Hyperkalemia d/t renal failure - resolved Monitor BMP  Hypokalemia Replace as  needed Monitor BMP  Acute anion gap metabolic acidosis - resolved Lactic Acidosis - resolved  Ozempic and metformin likely playing some role here.   Monitor BMP Fluids per nephrology     Bradycardia, suspect due to Hyperkalemia - resolved Can d/c telemetry  DM2 Hypoglycemia d/t poor po intake / antihyperglycemic medications  Glc have normalized Glc checks  Hold home Metformin, Pioglitazone, Sitagliptin hold SSI for now while NPO  Hypothyroidism  Check TSH and thyroid panel resume home Synthroid  A.fib on Xarelto HTN - BP at goal here  HLD Hold home antihypertensives for now: Amlodipine, Lasix, Lisinopril, Metoprolol Hold on statin for now     Class 1 obesity based on BMI: Body mass index is 30.56 kg/m.  Underweight - under 18  overweight - 25 to 29 obese - 30 or more Class 1 obesity: BMI of 30.0 to 34 Class 2 obesity: BMI of 35.0 to 39 Class 3 obesity: BMI of 40.0 to 49 Super Morbid Obesity: BMI 50-59 Super-super Morbid Obesity: BMI 60+ Significantly low or high BMI is associated with higher medical risk.  Weight management advised as adjunct to other disease management and risk reduction treatments    DVT prophylaxis: heparin IV fluids: per nephrology continuous IV fluids  Nutrition: none at this time plan for feed tube and enteral nutrition w/ that once placed, dietician is following Central lines / other devices: none  Code Status: DNR ACP documentation reviewed:  none on file in [facility]  TOC needs: TBD, will have PT/OT assess  Medical barriers to dispo: refeeding risk. Expected medical readiness for discharge after the weekend.  Subjective / Brief ROS:  Patient reports abd pain about the same Denies CP/SOB.  Reports able to have some liquids and tolerating this ok no nausea  Denies new weakness..   Family Communication: none at this time    Objective Findings:  Vitals:   07/31/23 1954 08/01/23 0339 08/01/23 0500 08/01/23  0755  BP: (!) 143/76 (!) 145/89  (!) 151/79  Pulse: 85 91  96  Resp: 16 16  18   Temp: 98 F (36.7 C) 97.8 F (36.6 C)  98 F (36.7 C)  TempSrc: Oral Oral    SpO2: 95% 94%  93%  Weight:   88.3 kg   Height:        Intake/Output Summary (Last 24 hours) at 08/01/2023 0805 Last data filed at 07/31/2023 1300 Gross per 24 hour  Intake 240 ml  Output --  Net 240 ml   Filed Weights   07/30/23 0407 07/31/23 0500 08/01/23 0500  Weight: 83.1 kg 88.5 kg 88.3 kg    Examination:  Physical Exam Constitutional:      General: She is not in acute distress. Cardiovascular:     Rate and Rhythm: Normal rate and regular rhythm.  Pulmonary:     Effort: Pulmonary effort is normal.     Breath sounds: Normal breath sounds.  Abdominal:     General: Abdomen is flat. Bowel sounds are normal.     Palpations: Abdomen is soft.     Tenderness: There is abdominal tenderness (LLQ, LUQ). There is no guarding or rebound.  Musculoskeletal:     Right lower leg: No edema.     Left lower leg: No edema.  Skin:    General: Skin is warm and dry.  Neurological:     General: No focal deficit present.     Mental Status: She is alert and oriented to person, place, and time.  Psychiatric:        Mood and Affect: Mood normal.        Behavior: Behavior normal.          Scheduled Medications:   Chlorhexidine Gluconate Cloth  6 each Topical Daily   feeding supplement (VITAL 1.5 CAL)  1,000 mL Per Tube Q24H   free water  100 mL Per Tube Q4H   heparin  5,000 Units Subcutaneous Q8H   metoprolol succinate  25 mg Oral Daily   multivitamin  1 tablet Oral QHS   multivitamin with minerals  1 tablet Oral Daily   pantoprazole (PROTONIX) IV  40 mg Intravenous Q12H   Ensure Max Protein  11 oz Oral BID   sodium chloride flush  10-40 mL Intracatheter Q12H    Continuous Infusions:    PRN Medications:  docusate sodium, HYDROmorphone (DILAUDID) injection, ondansetron (ZOFRAN) IV, mouth rinse,  oxyCODONE-acetaminophen, polyethylene glycol, sodium chloride flush  Antimicrobials from admission:  Anti-infectives (From admission, onward)    Start     Dose/Rate Route Frequency Ordered Stop   07/30/23 2200  piperacillin-tazobactam (ZOSYN) IVPB 3.375 g  Status:  Discontinued        3.375 g 12.5 mL/hr over 240 Minutes Intravenous Every 8 hours 07/30/23 1621 07/31/23 1132   07/29/23 0000  piperacillin-tazobactam (ZOSYN) IVPB 3.375 g  Status:  Discontinued        3.375 g 100 mL/hr over 30 Minutes Intravenous Every 6 hours 07/28/23 1704 07/30/23 1621   07/28/23 1400  piperacillin-tazobactam (ZOSYN) IVPB 2.25 g  Status:  Discontinued        2.25 g 100 mL/hr  over 30 Minutes Intravenous Every 8 hours 07/28/23 1336 07/28/23 1704           Data Reviewed:  I have personally reviewed the following...  CBC: Recent Labs  Lab 07/28/23 1120 07/29/23 0423 07/30/23 0429 07/31/23 0457 08/01/23 0519  WBC 8.4 6.7 7.8 8.7 9.2  HGB 12.7 10.3* 9.7* 9.6* 10.2*  HCT 37.0 29.2* 27.9* 27.7* 29.5*  MCV 95.1 88.5 90.9 89.4 89.7  PLT 378 273 168 156 172   Basic Metabolic Panel: Recent Labs  Lab 07/29/23 0029 07/29/23 0423 07/29/23 1621 07/30/23 0429 07/31/23 0457 08/01/23 0519  NA 134* 135 135 138 138 133*  K 5.2* 4.3 3.8 3.9 3.3* 3.8  CL 98 98 101 101 100 100  CO2 17* 22 27 29 29 23   GLUCOSE 180* 100* 89 123* 134* 189*  BUN 85* 63* 22 13 20  27*  CREATININE 7.13* 5.28* 2.00* 1.56* 1.95* 2.95*  CALCIUM 9.9 10.4* 9.3 8.5* 7.7* 7.7*  MG  --  2.1  --  2.2 2.2  --   PHOS 5.7* 4.4 1.9* 2.0* 3.3  --    GFR: Estimated Creatinine Clearance: 18.8 mL/min (A) (by C-G formula based on SCr of 2.95 mg/dL (H)). Liver Function Tests: Recent Labs  Lab 07/28/23 1120 07/29/23 0029 07/29/23 0423 07/29/23 1621 07/30/23 0429  AST 33  --  21  --   --   ALT 15  --  15  --   --   ALKPHOS 29*  --  24*  --   --   BILITOT 1.1  --  0.9  --   --   PROT 7.3  --  5.8*  --   --   ALBUMIN 3.9 3.0* 3.1*   3.2* 2.8* 2.8*   Recent Labs  Lab 07/28/23 1120 07/29/23 0423  LIPASE 810* 617*   No results for input(s): "AMMONIA" in the last 168 hours. Coagulation Profile: No results for input(s): "INR", "PROTIME" in the last 168 hours. Cardiac Enzymes: No results for input(s): "CKTOTAL", "CKMB", "CKMBINDEX", "TROPONINI" in the last 168 hours. BNP (last 3 results) No results for input(s): "PROBNP" in the last 8760 hours. HbA1C: No results for input(s): "HGBA1C" in the last 72 hours. CBG: Recent Labs  Lab 07/31/23 1710 07/31/23 2141 08/01/23 0123 08/01/23 0337 08/01/23 0756  GLUCAP 109* 139* 150* 163* 196*   Lipid Profile: No results for input(s): "CHOL", "HDL", "LDLCALC", "TRIG", "CHOLHDL", "LDLDIRECT" in the last 72 hours.  Thyroid Function Tests: No results for input(s): "TSH", "T4TOTAL", "FREET4", "T3FREE", "THYROIDAB" in the last 72 hours.  Anemia Panel: No results for input(s): "VITAMINB12", "FOLATE", "FERRITIN", "TIBC", "IRON", "RETICCTPCT" in the last 72 hours. Most Recent Urinalysis On File:     Component Value Date/Time   COLORURINE STRAW (A) 07/29/2023 0528   APPEARANCEUR CLEAR (A) 07/29/2023 0528   LABSPEC 1.011 07/29/2023 0528   PHURINE 5.0 07/29/2023 0528   GLUCOSEU 150 (A) 07/29/2023 0528   HGBUR SMALL (A) 07/29/2023 0528   BILIRUBINUR NEGATIVE 07/29/2023 0528   KETONESUR 5 (A) 07/29/2023 0528   PROTEINUR 30 (A) 07/29/2023 0528   NITRITE NEGATIVE 07/29/2023 0528   LEUKOCYTESUR NEGATIVE 07/29/2023 0528   Sepsis Labs: @LABRCNTIP (procalcitonin:4,lacticidven:4) Microbiology: Recent Results (from the past 240 hours)  MRSA Next Gen by PCR, Nasal     Status: None   Collection Time: 07/28/23  2:30 PM   Specimen: Nasal Mucosa; Nasal Swab  Result Value Ref Range Status   MRSA by PCR Next Gen NOT DETECTED NOT DETECTED Final  Comment: (NOTE) The GeneXpert MRSA Assay (FDA approved for NASAL specimens only), is one component of a comprehensive MRSA colonization  surveillance program. It is not intended to diagnose MRSA infection nor to guide or monitor treatment for MRSA infections. Test performance is not FDA approved in patients less than 27 years old. Performed at [facility], [address] [place], [place] [address]   Culture, blood (Routine X 2) w Reflex to ID Panel     Status: None (Preliminary result)   Collection Time: 07/28/23  3:32 PM   Specimen: BLOOD  Result Value Ref Range Status   Specimen Description BLOOD BLOOD LEFT WRIST  Final   Special Requests   Final    BOTTLES DRAWN AEROBIC ONLY Blood Culture results may not be optimal due to an inadequate volume of blood received in culture bottles   Culture   Final    NO GROWTH 4 DAYS Performed at [facility], [address] [place], [place] [address]    Report Status PENDING  Incomplete  Culture, blood (Routine X 2) w Reflex to ID Panel     Status: None (Preliminary result)   Collection Time: 07/28/23  3:42 PM   Specimen: BLOOD  Result Value Ref Range Status   Specimen Description BLOOD BLOOD RIGHT HAND  Final   Special Requests   Final    BOTTLES DRAWN AEROBIC AND ANAEROBIC Blood Culture results may not be optimal due to an inadequate volume of blood received in culture bottles   Culture   Final    NO GROWTH 4 DAYS Performed at [facility], [address] [place], [place] [address]    Report Status PENDING  Incomplete  Gastrointestinal Panel by PCR , Stool     Status: None   Collection Time: 07/28/23 10:29 PM   Specimen: Stool  Result Value Ref Range Status   Campylobacter species NOT DETECTED NOT DETECTED Corrected    Comment: CORRECTED ON 03/19 AT 0238: PREVIOUSLY REPORTED AS NONE DETECTED   Plesimonas shigelloides NOT DETECTED NOT DETECTED Corrected    Comment: CORRECTED ON 03/19 AT 0238: PREVIOUSLY REPORTED AS NONE DETECTED   Salmonella species NOT DETECTED NOT DETECTED Corrected    Comment: CORRECTED ON 03/19 AT 0238: PREVIOUSLY  REPORTED AS NONE DETECTED   Yersinia enterocolitica NOT DETECTED NOT DETECTED Corrected    Comment: CORRECTED ON 03/19 AT 0238: PREVIOUSLY REPORTED AS NONE DETECTED   Vibrio species NOT DETECTED NOT DETECTED Corrected    Comment: CORRECTED ON 03/19 AT 0238: PREVIOUSLY REPORTED AS NONE DETECTED   Vibrio cholerae NOT DETECTED NOT DETECTED Corrected    Comment: CORRECTED ON 03/19 AT 0238: PREVIOUSLY REPORTED AS NONE DETECTED   Enteroaggregative E coli (EAEC) NOT DETECTED NOT DETECTED Corrected    Comment: CORRECTED ON 03/19 AT 0238: PREVIOUSLY REPORTED AS NONE DETECTED   Enteropathogenic E coli (EPEC) NOT DETECTED NOT DETECTED Corrected    Comment: CORRECTED ON 03/19 AT 0238: PREVIOUSLY REPORTED AS NONE DETECTED   Enterotoxigenic E coli (ETEC) NOT DETECTED NOT DETECTED Corrected    Comment: CORRECTED ON 03/19 AT 0238: PREVIOUSLY REPORTED AS NONE DETECTED   Shiga like toxin producing E coli (STEC) NOT DETECTED NOT DETECTED Corrected    Comment: CORRECTED ON 03/19 AT 0238: PREVIOUSLY REPORTED AS NONE DETECTED   E. coli O157 NOT DETECTED NOT DETECTED Corrected    Comment: CORRECTED ON 03/19 AT 0238: PREVIOUSLY REPORTED AS NONE DETECTED   Shigella/Enteroinvasive E coli (EIEC) NOT DETECTED NOT DETECTED Corrected  Comment: CORRECTED ON 03/19 AT 0238: PREVIOUSLY REPORTED AS NONE DETECTED   Cryptosporidium NOT DETECTED NOT DETECTED Corrected    Comment: CORRECTED ON 03/19 AT 0238: PREVIOUSLY REPORTED AS NONE DETECTED   Cyclospora cayetanensis NOT DETECTED NOT DETECTED Corrected    Comment: CORRECTED ON 03/19 AT 0238: PREVIOUSLY REPORTED AS NONE DETECTED   Entamoeba histolytica NOT DETECTED NOT DETECTED Corrected    Comment: CORRECTED ON 03/19 AT 0238: PREVIOUSLY REPORTED AS NONE DETECTED   Giardia lamblia NOT DETECTED NOT DETECTED Corrected    Comment: CORRECTED ON 03/19 AT 0238: PREVIOUSLY REPORTED AS NONE DETECTED   Adenovirus F40/41 NOT DETECTED NOT DETECTED Corrected    Comment: CORRECTED ON  03/19 AT 0238: PREVIOUSLY REPORTED AS NONE DETECTED   Astrovirus NOT DETECTED NOT DETECTED Corrected    Comment: CORRECTED ON 03/19 AT 0238: PREVIOUSLY REPORTED AS NONE DETECTED   Norovirus GI/GII NOT DETECTED NOT DETECTED Corrected    Comment: CORRECTED ON 03/19 AT 0238: PREVIOUSLY REPORTED AS NONE DETECTED   Rotavirus A NOT DETECTED NOT DETECTED Corrected    Comment: CORRECTED ON 03/19 AT 0238: PREVIOUSLY REPORTED AS NONE DETECTED   Sapovirus (I, II, IV, and V) NOT DETECTED NOT DETECTED Corrected    Comment: Performed at [facility], [address] [place] [place], [place] [address] CORRECTED ON 03/19 AT 6045: PREVIOUSLY REPORTED AS NONE DETECTED   C Difficile Quick Screen w PCR reflex     Status: None   Collection Time: 07/28/23 10:29 PM   Specimen: STOOL  Result Value Ref Range Status   C Diff antigen NEGATIVE NEGATIVE Final   C Diff toxin NEGATIVE NEGATIVE Final   C Diff interpretation No C. difficile detected.  Final    Comment: Performed at [facility], [address] [place], [place] [address]      Radiology Studies last 3 days: DG Basil Dess Tube Plc W/Fl W/Rad Result Date: 07/31/2023 INDICATION: Patient admitted with acute pancreatitis with poor oral intake. Request for post pyloric Dobhoff placement for nutritional needs. EXAM: NASO G TUBE PLACEMENT WITH FL AND WITH RAD FLUOROSCOPY TIME:  Radiation Exposure Index (as provided by the fluoroscopic device): 69.70 mGy Kerma COMPLICATIONS: None immediate PROCEDURE: The Dobhoff tube was lubricated with viscous lidocaine inserted into the right nostril. Under intermittent fluoroscopic guidance, the Dobhoff tube was advanced through the stomach and into the second portion of the duodenum. Despite several catheter manipulations the tube was unable to be advanced to the duodenal-jejunal junction. The patient requested to terminate the exam at this time and the tube was affixed to the patient's nose with tape at 39 cm. Spot  fluoroscopic images were saved for documentation purposes. The patient tolerated the procedure well without immediate postprocedural complication. IMPRESSION: Fluoroscopic-guided placement of Dobhoff tube with tip terminating within the second portion of the duodenum. The tube is ready for immediate use. This exam was performed by Lynnette Caffey, PA-C, and was supervised and interpreted by Agustin Cree, MD. Electronically Signed   By: Agustin Cree M.D.   On: 07/31/2023 15:57   CT ABDOMEN PELVIS WO CONTRAST Result Date: 07/28/2023 CLINICAL DATA:  Vomiting for 1 week. EXAM: CT ABDOMEN AND PELVIS WITHOUT CONTRAST TECHNIQUE: Multidetector CT imaging of the abdomen and pelvis was performed following the standard protocol without IV contrast. RADIATION DOSE REDUCTION: This exam was performed according to the departmental dose-optimization program which includes automated exposure control, adjustment of the mA and/or kV according to patient size and/or use of iterative reconstruction technique. COMPARISON:  April 30, 2021. FINDINGS: Lower chest: No acute abnormality. Hepatobiliary: No focal liver abnormality is seen. Status post cholecystectomy. No biliary dilatation. Pancreas: Some degree of fatty replacement of the pancreas is noted. Mild inflammatory changes are noted around the pancreas. No definite pseudocyst formation is noted. Spleen: Normal in size without focal abnormality. Adrenals/Urinary Tract: Adrenal glands are unremarkable. Kidneys are normal, without renal calculi, focal lesion, or hydronephrosis. Bladder is unremarkable. Stomach/Bowel: Stomach is unremarkable. The appendix appears normal. There is no evidence of bowel obstruction. Colon is unremarkable. However, there is interval development of moderate wall thickening of the duodenum and proximal jejunum with surrounding inflammatory changes. It is uncertain if this represents primary inflammation of the bowel or peptic ulcer disease, or if this is  secondary to pancreatitis. Vascular/Lymphatic: Aortic atherosclerosis. No enlarged abdominal or pelvic lymph nodes. Reproductive: Uterus and bilateral adnexa are unremarkable. Other: No ascites or hernia is noted. Musculoskeletal: Stable old L2 compression fracture. No acute osseous abnormality is noted. IMPRESSION: Moderate wall thickening of the duodenum and proximal jejunum is noted with surrounding inflammatory changes. This is concerning for severe enteritis or possibly peptic ulcer disease although the possibility of this being secondary to some degree of pancreatitis cannot be excluded. Clinical correlation is recommended. Aortic Atherosclerosis (ICD10-I70.0). Electronically Signed   By: Lupita Raider M.D.   On: 07/28/2023 15:18         Sunnie Nielsen, DO [facility] 08/01/2023, 8:05 AM    Dictation software may have been used to generate the above note. Typos may occur and escape review in typed/dictated notes. Please contact Dr Lyn Hollingshead directly for clarity if needed.  Staff may message me via secure chat in Epic  but this may not receive an immediate response,  please page me for urgent matters!  If 7PM-7AM, please contact night coverage [email or web address]

## 2023-08-01 NOTE — Progress Notes (Signed)
 [facility]  PROGRESS NOTE   Subjective:   Patient sitting up in the bed.  Feels weak and tired.  Objective:  Vital signs: Blood pressure 131/83, pulse 88, temperature 97.9 F (36.6 C), resp. rate 18, height 5\' 7"  (1.702 m), weight 88.3 kg, SpO2 94%.  Intake/Output Summary (Last 24 hours) at 08/01/2023 2110 Last data filed at 08/01/2023 1415 Gross per 24 hour  Intake 240 ml  Output --  Net 240 ml   Filed Weights   07/30/23 0407 07/31/23 0500 08/01/23 0500  Weight: 83.1 kg 88.5 kg 88.3 kg     Physical Exam: General:  No acute distress  Head:  Normocephalic, atraumatic. Moist oral mucosal membranes  Eyes:  Anicteric  Neck:  Supple  Lungs:   Clear to auscultation, normal effort  Heart:  S1S2 no rubs  Abdomen:   Soft, nontender, bowel sounds present  Extremities:  peripheral edema.  Neurologic:  Awake, alert, following commands  Skin:  No lesions  Access:     Basic Metabolic Panel: Recent Labs  Lab 07/29/23 0029 07/29/23 0423 07/29/23 1621 07/30/23 0429 07/31/23 0457 08/01/23 0519  NA 134* 135 135 138 138 133*  K 5.2* 4.3 3.8 3.9 3.3* 3.8  CL 98 98 101 101 100 100  CO2 17* 22 27 29 29 23   GLUCOSE 180* 100* 89 123* 134* 189*  BUN 85* 63* 22 13 20  27*  CREATININE 7.13* 5.28* 2.00* 1.56* 1.95* 2.95*  CALCIUM 9.9 10.4* 9.3 8.5* 7.7* 7.7*  MG  --  2.1  --  2.2 2.2  --   PHOS 5.7* 4.4 1.9* 2.0* 3.3  --    GFR: Estimated Creatinine Clearance: 18.8 mL/min (A) (by C-G formula based on SCr of 2.95 mg/dL (H)).  Liver Function Tests: Recent Labs  Lab 07/28/23 1120 07/29/23 0029 07/29/23 0423 07/29/23 1621 07/30/23 0429  AST 33  --  21  --   --   ALT 15  --  15  --   --   ALKPHOS 29*  --  24*  --   --   BILITOT 1.1  --  0.9  --   --   PROT 7.3  --  5.8*  --   --   ALBUMIN 3.9 3.0* 3.1*  3.2* 2.8* 2.8*   Recent Labs  Lab 07/28/23 1120 07/29/23 0423  LIPASE 810* 617*   No results for input(s): "AMMONIA" in the last 168  hours.  CBC: Recent Labs  Lab 07/28/23 1120 07/29/23 0423 07/30/23 0429 07/31/23 0457 08/01/23 0519  WBC 8.4 6.7 7.8 8.7 9.2  HGB 12.7 10.3* 9.7* 9.6* 10.2*  HCT 37.0 29.2* 27.9* 27.7* 29.5*  MCV 95.1 88.5 90.9 89.4 89.7  PLT 378 273 168 156 172     HbA1C: Hgb A1c MFr Bld  Date/Time Value Ref Range Status  08/01/2023 05:19 AM 6.0 (H) 4.8 - 5.6 % Final    Comment:    (NOTE) Pre diabetes:          5.7%-6.4%  Diabetes:              >6.4%  Glycemic control for   <7.0% adults with diabetes   04/30/2021 01:44 PM 6.4 (H) 4.8 - 5.6 % Final    Comment:    (NOTE)         Prediabetes: 5.7 - 6.4         Diabetes: >6.4         Glycemic control for adults with diabetes: <7.0  Urinalysis: No results for input(s): "COLORURINE", "LABSPEC", "PHURINE", "GLUCOSEU", "HGBUR", "BILIRUBINUR", "KETONESUR", "PROTEINUR", "UROBILINOGEN", "NITRITE", "LEUKOCYTESUR" in the last 72 hours.  Invalid input(s): "APPERANCEUR"    Imaging: ECHOCARDIOGRAM COMPLETE Result Date: 08/01/2023    ECHOCARDIOGRAM REPORT   Patient Name:   Katherine [name] Date of Exam: 08/01/2023 Medical Rec #:  [id number]         Height:       67.0 in Accession #:    [id number]        Weight:       194.7 lb Date of Birth:  [birth year]         BSA:          1.999 m Patient Age:    77 years          BP:           151/79 mmHg Patient Gender: F                 HR:           82 bpm. Exam Location:  [facility] Procedure: 2D Echo, Cardiac Doppler and Color Doppler (Both Spectral and Color            Flow Doppler were utilized during procedure). Indications:     Abnormal ECG R94.31  History:         Patient has no prior history of Echocardiogram examinations.  Sonographer:     Elwin Sleight RDCS Referring Phys:  [id number] Ezequiel Essex Diagnosing Phys: Adrian Blackwater  Sonographer Comments: Image acquisition challenging due to respiratory motion. IMPRESSIONS  1. Left ventricular ejection fraction, by estimation, is 60 to 65%. The left ventricle has  normal function. The left ventricle has no regional wall motion abnormalities. There is mild concentric left ventricular hypertrophy. Left ventricular diastolic parameters were normal.  2. Right ventricular systolic function is normal. The right ventricular size is normal.  3. Left atrial size was mild to moderately dilated.  4. Right atrial size was mildly dilated.  5. The mitral valve is normal in structure. Mild to moderate mitral valve regurgitation. No evidence of mitral stenosis.  6. The aortic valve is calcified. Aortic valve regurgitation is not visualized. Aortic valve sclerosis/calcification is present, without any evidence of aortic stenosis.  7. The inferior vena cava is normal in size with greater than 50% respiratory variability, suggesting right atrial pressure of 3 mmHg. FINDINGS  Left Ventricle: Left ventricular ejection fraction, by estimation, is 60 to 65%. The left ventricle has normal function. The left ventricle has no regional wall motion abnormalities. Strain was performed and the global longitudinal strain is indeterminate. The left ventricular internal cavity size was normal in size. There is mild concentric left ventricular hypertrophy. Left ventricular diastolic parameters were normal. Right Ventricle: The right ventricular size is normal. No increase in right ventricular wall thickness. Right ventricular systolic function is normal. Left Atrium: Left atrial size was mild to moderately dilated. Right Atrium: Right atrial size was mildly dilated. Pericardium: There is no evidence of pericardial effusion. Mitral Valve: The mitral valve is normal in structure. Mild to moderate mitral valve regurgitation. No evidence of mitral valve stenosis. Tricuspid Valve: The tricuspid valve is normal in structure. Tricuspid valve regurgitation is mild . No evidence of tricuspid stenosis. Aortic Valve: The aortic valve is calcified. Aortic valve regurgitation is not visualized. Aortic valve  sclerosis/calcification is present, without any evidence of aortic stenosis. Aortic valve peak gradient measures 7.0 mmHg. Pulmonic Valve: The pulmonic valve was  normal in structure. Pulmonic valve regurgitation is not visualized. No evidence of pulmonic stenosis. Aorta: The aortic root is normal in size and structure. Venous: The inferior vena cava is normal in size with greater than 50% respiratory variability, suggesting right atrial pressure of 3 mmHg. IAS/Shunts: No atrial level shunt detected by color flow Doppler. Additional Comments: 3D was performed not requiring image post processing on an independent workstation and was indeterminate.  LEFT VENTRICLE PLAX 2D LVIDd:         4.00 cm   Diastology LVIDs:         2.90 cm   LV e' medial:  9.46 cm/s LV PW:         1.10 cm   LV e' lateral: 13.70 cm/s LV IVS:        1.10 cm LVOT diam:     2.00 cm LV SV:         62 LV SV Index:   31 LVOT Area:     3.14 cm  RIGHT VENTRICLE RV Basal diam:  3.40 cm RV S prime:     13.60 cm/s TAPSE (M-mode): 1.9 cm LEFT ATRIUM             Index        RIGHT ATRIUM           Index LA diam:        4.70 cm 2.35 cm/m   RA Area:     16.70 cm LA Vol (A2C):   59.0 ml 29.51 ml/m  RA Volume:   36.70 ml  18.36 ml/m LA Vol (A4C):   45.3 ml 22.66 ml/m LA Biplane Vol: 53.4 ml 26.71 ml/m  AORTIC VALVE                 PULMONIC VALVE AV Area (Vmax): 2.56 cm     PV Vmax:        1.06 m/s AV Vmax:        132.50 cm/s  PV Peak grad:   4.5 mmHg AV Peak Grad:   7.0 mmHg     RVOT Peak grad: 2 mmHg LVOT Vmax:      108.00 cm/s LVOT Vmean:     71.600 cm/s LVOT VTI:       0.196 m  AORTA Ao Root diam: 3.00 cm Ao Asc diam:  3.25 cm  SHUNTS Systemic VTI:  0.20 m Systemic Diam: 2.00 cm Adrian Blackwater Electronically signed by Adrian Blackwater Signature Date/Time: 08/01/2023/4:20:41 PM    Final    DG Basil Dess Tube Plc W/Fl W/Rad Result Date: 07/31/2023 INDICATION: Patient admitted with acute pancreatitis with poor oral intake. Request for post pyloric Dobhoff  placement for nutritional needs. EXAM: NASO G TUBE PLACEMENT WITH FL AND WITH RAD FLUOROSCOPY TIME:  Radiation Exposure Index (as provided by the fluoroscopic device): 69.70 mGy Kerma COMPLICATIONS: None immediate PROCEDURE: The Dobhoff tube was lubricated with viscous lidocaine inserted into the right nostril. Under intermittent fluoroscopic guidance, the Dobhoff tube was advanced through the stomach and into the second portion of the duodenum. Despite several catheter manipulations the tube was unable to be advanced to the duodenal-jejunal junction. The patient requested to terminate the exam at this time and the tube was affixed to the patient's nose with tape at 39 cm. Spot fluoroscopic images were saved for documentation purposes. The patient tolerated the procedure well without immediate postprocedural complication. IMPRESSION: Fluoroscopic-guided placement of Dobhoff tube with tip terminating within the second portion of the duodenum. The  tube is ready for immediate use. This exam was performed by Lynnette Caffey, PA-C, and was supervised and interpreted by Agustin Cree, MD. Electronically Signed   By: Agustin Cree M.D.   On: 07/31/2023 15:57     Medications:    feeding supplement (VITAL 1.5 CAL) 1,000 mL (08/01/23 1857)    Chlorhexidine Gluconate Cloth  6 each Topical Daily   free water  100 mL Per Tube Q4H   heparin  5,000 Units Subcutaneous Q8H   insulin aspart  0-5 Units Subcutaneous QHS   insulin aspart  0-9 Units Subcutaneous TID WC   insulin aspart  2 Units Subcutaneous TID WC   metoprolol succinate  25 mg Oral Daily   multivitamin  1 tablet Oral QHS   multivitamin with minerals  1 tablet Oral Daily   pantoprazole (PROTONIX) IV  40 mg Intravenous Q12H   Ensure Max Protein  11 oz Oral BID   sodium chloride flush  10-40 mL Intracatheter Q12H    Assessment/ Plan:     78 year old female with history of diabetes, obesity, hypertension, hyperlipidemia admitted with history of abdominal  pain, nausea, vomiting and diarrhea.  She is found to have acute kidney injury.  Initially started on CRRT.  Subsequently the metabolic acidosis improved. Presently renal indices are stable. Patient with decreased p.o. intake.  Being evaluated for tube feeds. Will continue to monitor closely.  Labs and medications reviewed. Will continue to follow along with you.   LOS: 4 Lorain Childes, MD [facility] 3/22/20259:10 PM

## 2023-08-02 DIAGNOSIS — N179 Acute kidney failure, unspecified: Secondary | ICD-10-CM | POA: Diagnosis not present

## 2023-08-02 LAB — PHOSPHORUS: Phosphorus: 4.1 mg/dL (ref 2.5–4.6)

## 2023-08-02 LAB — GLUCOSE, CAPILLARY
Glucose-Capillary: 303 mg/dL — ABNORMAL HIGH (ref 70–99)
Glucose-Capillary: 172 mg/dL — ABNORMAL HIGH (ref 70–99)
Glucose-Capillary: 121 mg/dL — ABNORMAL HIGH (ref 70–99)
Glucose-Capillary: 161 mg/dL — ABNORMAL HIGH (ref 70–99)
Glucose-Capillary: 149 mg/dL — ABNORMAL HIGH (ref 70–99)

## 2023-08-02 LAB — CULTURE, BLOOD (ROUTINE X 2)
Culture: NO GROWTH
Culture: NO GROWTH

## 2023-08-02 LAB — BASIC METABOLIC PANEL
Anion gap: 11 (ref 5–15)
BUN: 35 mg/dL — ABNORMAL HIGH (ref 8–23)
CO2: 23 mmol/L (ref 22–32)
Calcium: 7.6 mg/dL — ABNORMAL LOW (ref 8.9–10.3)
Chloride: 99 mmol/L (ref 98–111)
Creatinine, Ser: 3.55 mg/dL — ABNORMAL HIGH (ref 0.44–1.00)
GFR, Estimated: 13 mL/min — ABNORMAL LOW (ref >60)
Glucose, Bld: 321 mg/dL — ABNORMAL HIGH (ref 70–99)
Potassium: 4.2 mmol/L (ref 3.5–5.1)
Sodium: 133 mmol/L — ABNORMAL LOW (ref 135–145)

## 2023-08-02 LAB — MAGNESIUM: Magnesium: 2 mg/dL (ref 1.7–2.4)

## 2023-08-02 MED ORDER — LIOTHYRONINE SODIUM 25 MCG PO TABS
50.0000 ug | ORAL_TABLET | Freq: Every morning | ORAL | Status: DC
  Administered 2023-08-03 – 2023-08-07 (×5): 50 ug via ORAL
  Filled 2023-08-02 (×5): qty 2

## 2023-08-02 MED ORDER — LEVOTHYROXINE SODIUM 50 MCG PO TABS
150.0000 ug | ORAL_TABLET | Freq: Every day | ORAL | Status: DC
  Administered 2023-08-03 – 2023-08-07 (×5): 150 ug via ORAL
  Filled 2023-08-02 (×5): qty 1

## 2023-08-02 MED ORDER — AMLODIPINE BESYLATE 5 MG PO TABS
5.0000 mg | ORAL_TABLET | Freq: Every day | ORAL | Status: DC
  Administered 2023-08-02 – 2023-08-06 (×5): 5 mg via ORAL
  Filled 2023-08-02 (×5): qty 1

## 2023-08-02 MED ORDER — INSULIN ASPART 100 UNIT/ML IJ SOLN
0.0000 [IU] | INTRAMUSCULAR | Status: DC
  Administered 2023-08-02: 3 [IU] via SUBCUTANEOUS
  Administered 2023-08-02: 2 [IU] via SUBCUTANEOUS
  Administered 2023-08-02: 3 [IU] via SUBCUTANEOUS
  Administered 2023-08-02: 2 [IU] via SUBCUTANEOUS
  Administered 2023-08-03 (×2): 5 [IU] via SUBCUTANEOUS
  Administered 2023-08-03 (×2): 3 [IU] via SUBCUTANEOUS
  Administered 2023-08-03: 5 [IU] via SUBCUTANEOUS
  Administered 2023-08-04: 3 [IU] via SUBCUTANEOUS
  Administered 2023-08-04: 5 [IU] via SUBCUTANEOUS
  Administered 2023-08-04 – 2023-08-05 (×3): 3 [IU] via SUBCUTANEOUS
  Administered 2023-08-05: 2 [IU] via SUBCUTANEOUS
  Administered 2023-08-05: 5 [IU] via SUBCUTANEOUS
  Administered 2023-08-06: 2 [IU] via SUBCUTANEOUS
  Administered 2023-08-06: 5 [IU] via SUBCUTANEOUS
  Administered 2023-08-06: 2 [IU] via SUBCUTANEOUS
  Administered 2023-08-06: 3 [IU] via SUBCUTANEOUS
  Filled 2023-08-02 (×19): qty 1

## 2023-08-02 MED ORDER — INSULIN ASPART 100 UNIT/ML IJ SOLN
3.0000 [IU] | INTRAMUSCULAR | Status: DC
  Administered 2023-08-02 – 2023-08-07 (×27): 3 [IU] via SUBCUTANEOUS
  Filled 2023-08-02 (×25): qty 1

## 2023-08-02 MED ORDER — PANTOPRAZOLE SODIUM 40 MG PO TBEC
40.0000 mg | DELAYED_RELEASE_TABLET | Freq: Two times a day (BID) | ORAL | Status: DC
  Administered 2023-08-02 – 2023-08-07 (×10): 40 mg via ORAL
  Filled 2023-08-02 (×10): qty 1

## 2023-08-02 MED ORDER — APIXABAN 5 MG PO TABS
5.0000 mg | ORAL_TABLET | Freq: Two times a day (BID) | ORAL | Status: DC
  Administered 2023-08-02 – 2023-08-07 (×10): 5 mg via ORAL
  Filled 2023-08-02 (×10): qty 1

## 2023-08-02 NOTE — Plan of Care (Signed)

## 2023-08-02 NOTE — Progress Notes (Signed)
 [facility]  PROGRESS NOTE   Subjective:   Patient lying in bed in NAD.  Tube feeds in progress.  Objective:  Vital signs: Blood pressure (!) 143/83, pulse 99, temperature (!) 97.4 F (36.3 C), resp. rate 16, height 5\' 7"  (1.702 m), weight 88.3 kg, SpO2 98%.  Intake/Output Summary (Last 24 hours) at 08/02/2023 1711 Last data filed at 08/02/2023 1206 Gross per 24 hour  Intake 1143.26 ml  Output --  Net 1143.26 ml   Filed Weights   07/30/23 0407 07/31/23 0500 08/01/23 0500  Weight: 83.1 kg 88.5 kg 88.3 kg     Physical Exam: General:  No acute distress  Head:  Normocephalic, atraumatic. Moist oral mucosal membranes  Eyes:  Anicteric  Neck:  Supple  Lungs:   Clear to auscultation, normal effort  Heart:  S1S2 no rubs  Abdomen:   Soft, nontender, bowel sounds present  Extremities:  peripheral edema.  Neurologic:  Awake, alert, following commands  Skin:  No lesions  Access:     Basic Metabolic Panel: Recent Labs  Lab 07/29/23 0423 07/29/23 1621 07/30/23 0429 07/31/23 0457 08/01/23 0519 08/02/23 0609  NA 135 135 138 138 133* 133*  K 4.3 3.8 3.9 3.3* 3.8 4.2  CL 98 101 101 100 100 99  CO2 22 27 29 29 23 23   GLUCOSE 100* 89 123* 134* 189* 321*  BUN 63* 22 13 20  27* 35*  CREATININE 5.28* 2.00* 1.56* 1.95* 2.95* 3.55*  CALCIUM 10.4* 9.3 8.5* 7.7* 7.7* 7.6*  MG 2.1  --  2.2 2.2  --  2.0  PHOS 4.4 1.9* 2.0* 3.3  --  4.1   GFR: Estimated Creatinine Clearance: 15.6 mL/min (A) (by C-G formula based on SCr of 3.55 mg/dL (H)).  Liver Function Tests: Recent Labs  Lab 07/28/23 1120 07/29/23 0029 07/29/23 0423 07/29/23 1621 07/30/23 0429  AST 33  --  21  --   --   ALT 15  --  15  --   --   ALKPHOS 29*  --  24*  --   --   BILITOT 1.1  --  0.9  --   --   PROT 7.3  --  5.8*  --   --   ALBUMIN 3.9 3.0* 3.1*  3.2* 2.8* 2.8*   Recent Labs  Lab 07/28/23 1120 07/29/23 0423  LIPASE 810* 617*   No results for input(s): "AMMONIA" in the last 168  hours.  CBC: Recent Labs  Lab 07/28/23 1120 07/29/23 0423 07/30/23 0429 07/31/23 0457 08/01/23 0519  WBC 8.4 6.7 7.8 8.7 9.2  HGB 12.7 10.3* 9.7* 9.6* 10.2*  HCT 37.0 29.2* 27.9* 27.7* 29.5*  MCV 95.1 88.5 90.9 89.4 89.7  PLT 378 273 168 156 172     HbA1C: Hgb A1c MFr Bld  Date/Time Value Ref Range Status  08/01/2023 05:19 AM 6.0 (H) 4.8 - 5.6 % Final    Comment:    (NOTE) Pre diabetes:          5.7%-6.4%  Diabetes:              >6.4%  Glycemic control for   <7.0% adults with diabetes   04/30/2021 01:44 PM 6.4 (H) 4.8 - 5.6 % Final    Comment:    (NOTE)         Prediabetes: 5.7 - 6.4         Diabetes: >6.4         Glycemic control for adults with diabetes: <7.0  Urinalysis: No results for input(s): "COLORURINE", "LABSPEC", "PHURINE", "GLUCOSEU", "HGBUR", "BILIRUBINUR", "KETONESUR", "PROTEINUR", "UROBILINOGEN", "NITRITE", "LEUKOCYTESUR" in the last 72 hours.  Invalid input(s): "APPERANCEUR"    Imaging: ECHOCARDIOGRAM COMPLETE Result Date: 08/01/2023    ECHOCARDIOGRAM REPORT   Patient Name:   Katherine [name] Date of Exam: 08/01/2023 Medical Rec #:  [id number]         Height:       67.0 in Accession #:    [id number]        Weight:       194.7 lb Date of Birth:  16-Jan-1946         BSA:          1.999 m Patient Age:    77 years          BP:           151/79 mmHg Patient Gender: F                 HR:           82 bpm. Exam Location:  [facility] Procedure: 2D Echo, Cardiac Doppler and Color Doppler (Both Spectral and Color            Flow Doppler were utilized during procedure). Indications:     Abnormal ECG R94.31  History:         Patient has no prior history of Echocardiogram examinations.  Sonographer:     Elwin Sleight RDCS Referring Phys:  [id number] Ezequiel Essex Diagnosing Phys: Adrian Blackwater  Sonographer Comments: Image acquisition challenging due to respiratory motion. IMPRESSIONS  1. Left ventricular ejection fraction, by estimation, is 60 to 65%. The left ventricle has  normal function. The left ventricle has no regional wall motion abnormalities. There is mild concentric left ventricular hypertrophy. Left ventricular diastolic parameters were normal.  2. Right ventricular systolic function is normal. The right ventricular size is normal.  3. Left atrial size was mild to moderately dilated.  4. Right atrial size was mildly dilated.  5. The mitral valve is normal in structure. Mild to moderate mitral valve regurgitation. No evidence of mitral stenosis.  6. The aortic valve is calcified. Aortic valve regurgitation is not visualized. Aortic valve sclerosis/calcification is present, without any evidence of aortic stenosis.  7. The inferior vena cava is normal in size with greater than 50% respiratory variability, suggesting right atrial pressure of 3 mmHg. FINDINGS  Left Ventricle: Left ventricular ejection fraction, by estimation, is 60 to 65%. The left ventricle has normal function. The left ventricle has no regional wall motion abnormalities. Strain was performed and the global longitudinal strain is indeterminate. The left ventricular internal cavity size was normal in size. There is mild concentric left ventricular hypertrophy. Left ventricular diastolic parameters were normal. Right Ventricle: The right ventricular size is normal. No increase in right ventricular wall thickness. Right ventricular systolic function is normal. Left Atrium: Left atrial size was mild to moderately dilated. Right Atrium: Right atrial size was mildly dilated. Pericardium: There is no evidence of pericardial effusion. Mitral Valve: The mitral valve is normal in structure. Mild to moderate mitral valve regurgitation. No evidence of mitral valve stenosis. Tricuspid Valve: The tricuspid valve is normal in structure. Tricuspid valve regurgitation is mild . No evidence of tricuspid stenosis. Aortic Valve: The aortic valve is calcified. Aortic valve regurgitation is not visualized. Aortic valve  sclerosis/calcification is present, without any evidence of aortic stenosis. Aortic valve peak gradient measures 7.0 mmHg. Pulmonic Valve: The pulmonic valve was  normal in structure. Pulmonic valve regurgitation is not visualized. No evidence of pulmonic stenosis. Aorta: The aortic root is normal in size and structure. Venous: The inferior vena cava is normal in size with greater than 50% respiratory variability, suggesting right atrial pressure of 3 mmHg. IAS/Shunts: No atrial level shunt detected by color flow Doppler. Additional Comments: 3D was performed not requiring image post processing on an independent workstation and was indeterminate.  LEFT VENTRICLE PLAX 2D LVIDd:         4.00 cm   Diastology LVIDs:         2.90 cm   LV e' medial:  9.46 cm/s LV PW:         1.10 cm   LV e' lateral: 13.70 cm/s LV IVS:        1.10 cm LVOT diam:     2.00 cm LV SV:         62 LV SV Index:   31 LVOT Area:     3.14 cm  RIGHT VENTRICLE RV Basal diam:  3.40 cm RV S prime:     13.60 cm/s TAPSE (M-mode): 1.9 cm LEFT ATRIUM             Index        RIGHT ATRIUM           Index LA diam:        4.70 cm 2.35 cm/m   RA Area:     16.70 cm LA Vol (A2C):   59.0 ml 29.51 ml/m  RA Volume:   36.70 ml  18.36 ml/m LA Vol (A4C):   45.3 ml 22.66 ml/m LA Biplane Vol: 53.4 ml 26.71 ml/m  AORTIC VALVE                 PULMONIC VALVE AV Area (Vmax): 2.56 cm     PV Vmax:        1.06 m/s AV Vmax:        132.50 cm/s  PV Peak grad:   4.5 mmHg AV Peak Grad:   7.0 mmHg     RVOT Peak grad: 2 mmHg LVOT Vmax:      108.00 cm/s LVOT Vmean:     71.600 cm/s LVOT VTI:       0.196 m  AORTA Ao Root diam: 3.00 cm Ao Asc diam:  3.25 cm  SHUNTS Systemic VTI:  0.20 m Systemic Diam: 2.00 cm [employer] Electronically signed by Adrian Blackwater Signature Date/Time: 08/01/2023/4:20:41 PM    Final      Medications:    feeding supplement (VITAL 1.5 CAL) 1,000 mL (08/02/23 0317)    amLODipine  5 mg Oral QHS   apixaban  5 mg Oral BID   Chlorhexidine Gluconate  Cloth  6 each Topical Daily   free water  100 mL Per Tube Q4H   insulin aspart  0-15 Units Subcutaneous Q4H   insulin aspart  3 Units Subcutaneous Q4H   [START ON 08/03/2023] levothyroxine  150 mcg Oral Daily   [START ON 08/03/2023] liothyronine  50 mcg Oral q morning   metoprolol succinate  25 mg Oral Daily   multivitamin  1 tablet Oral QHS   multivitamin with minerals  1 tablet Oral Daily   pantoprazole  40 mg Oral BID   Ensure Max Protein  11 oz Oral BID   sodium chloride flush  10-40 mL Intracatheter Q12H    Assessment/ Plan:     78 year old female with history of diabetes, hypertension, hyperlipidemia,  anemia, initially admitted with history of abdominal pain, nausea, vomiting and diarrhea.    #1: Acute kidney injury: Renal indices are still worsening.  Patient probably has prerenal azotemia.  Presently on tube feeds.  May need IV fluid supplementation if renal indices do not improve.  #2: Pancreatitis/acute gastroenteritis: Patient is presently on tube feeds.  #3: Hypertension: Continue amlodipine and metoprolol as ordered.  #4: Diabetes: Continue insulin protocol.  Labs and medications reviewed. Will continue to follow along with you.   LOS: 5 Lorain Childes, MD [facility] 3/23/20255:11 PM

## 2023-08-02 NOTE — Progress Notes (Signed)
 PROGRESS NOTE    Katherine [name]   WGN:[id number] DOB: Feb 02, 1946  DOA: 07/28/2023 Date of Service: 08/02/23 which is hospital day 5  PCP: Dorothey Baseman, MD    Hospital course / significant events:   HPI: Katherine [name] is a 78 year old female with a past medical history significant for diabetes mellitus on metformin and Ozempic, hypertension, hyperlipidemia, anemia, cancer who presents to [facility] ED 07/28/2023 due to complaints of abdominal pain, nausea, vomiting, diarrhea x1 week, worsening.   03/18: admitted with Acute Pancreatitis vs. Gastroenteritis, along with Acute Kidney Injury, Hyperkalemia, and severe Anion Gap Metabolic Acidosis requiring initiation of CRRT.  03/19: renal indices are improving on HD. Persistent abd pain. Initiated CLD. 03/20: on CRRT, not eating well due to abd pain. Renal function has improved. S/p RD evaluation - recs for post-pyloric dobhoff tube placement if no improvement po intake. Cr to 1.56, UOP adequate, CRRT d/c.  03/21: transfer to hospitalist service. Still not taking any po, feed tube placement w/ postpyloric Dobhoff  03/22: titrating tube feeds. Some po intake liquids but not much. Pain about same.  03/23: po intake improving    Consultants:  PCCU initial admission Nephrology  Radiology   Procedures/Surgeries: 07/31/23: feed tube placement w/ postpyloric Dobhoff       ASSESSMENT & PLAN:   AKI on CKD IIIb (baseline EGFR 31) ATN from prolonged nausea and vomiting.   S/p CRRT 07/28/23-07/30/23. Nephrology following, reevaluate for further need of dialysis over the course of the hospitalization. Monitor I&O Monitor BMP Hold nephrotoxic medications   Pancreatitis likely d/t GLP1 Rx Sepsis has been RULED OUT  Poor po intake d/t abd pain S/p feeding tube placement 07/31/23 Tube feeds per dietary recs, see orders  Advance po intake as able   Hyperkalemia d/t renal failure - resolved Monitor BMP  Hypokalemia Replace as  needed Monitor BMP  Acute anion gap metabolic acidosis - resolved Lactic Acidosis - resolved  Ozempic and metformin likely playing some role here.   Monitor BMP    Bradycardia, suspect due to Hyperkalemia - resolved Can d/c telemetry, resume metoprolol   DM2 Hypoglycemia d/t poor po intake / antihyperglycemic medications - resolved now hyperglycemic Hold home Metformin, Pioglitazone, Sitagliptin SSI   Hypothyroidism  Synthroid  A.fib  Metoprolol Hold xarelto d/t renal fxn, can use eliquis  HTN Metoprolol, amlodipine Hold lasix, ACE/ARB d/t AKI  HLD Hold statin for now     Class 1 obesity based on BMI: Body mass index is 30.56 kg/m.  Underweight - under 18  overweight - 25 to 29 obese - 30 or more Class 1 obesity: BMI of 30.0 to 34 Class 2 obesity: BMI of 35.0 to 39 Class 3 obesity: BMI of 40.0 to 49 Super Morbid Obesity: BMI 50-59 Super-super Morbid Obesity: BMI 60+ Significantly low or high BMI is associated with higher medical risk.  Weight management advised as adjunct to other disease management and risk reduction treatments    DVT prophylaxis: eliquis IV fluids: per nephrology Nutrition: tube feeds, po to advance as tolerated Central lines / other devices: none  Code Status: DNR ACP documentation reviewed:  none on file in [facility]  TOC needs: TBD, will have PT/OT assess  Medical barriers to dispo: refeeding risk. Expected medical readiness for discharge once po intake and renal function improve hopefully next 2-3 days               Subjective / Brief ROS:  Patient reports abd pain a bit better today Eating some  soft foods, liquids  Denies CP/SOB.  Denies new weakness..   Family Communication: none at this time    Objective Findings:  Vitals:   08/01/23 1555 08/01/23 2024 08/02/23 0359 08/02/23 0757  BP: 131/83 130/80 (!) 141/69 (!) 149/92  Pulse: 88 80 91 95  Resp: 18 16  16   Temp: 97.9 F (36.6 C) 98 F (36.7 C) 98.1 F (36.7  C) 97.7 F (36.5 C)  TempSrc:  Oral Oral Oral  SpO2: 94% 95% 94% 96%  Weight:      Height:        Intake/Output Summary (Last 24 hours) at 08/02/2023 1506 Last data filed at 08/02/2023 1206 Gross per 24 hour  Intake 1143.26 ml  Output --  Net 1143.26 ml   Filed Weights   07/30/23 0407 07/31/23 0500 08/01/23 0500  Weight: 83.1 kg 88.5 kg 88.3 kg    Examination:  Physical Exam Constitutional:      General: She is not in acute distress. Cardiovascular:     Rate and Rhythm: Normal rate and regular rhythm.  Pulmonary:     Effort: Pulmonary effort is normal.     Breath sounds: Normal breath sounds.  Abdominal:     General: Abdomen is flat. Bowel sounds are normal.     Palpations: Abdomen is soft.     Tenderness: There is no abdominal tenderness (LLQ, LUQ). There is no guarding or rebound.  Musculoskeletal:     Right lower leg: No edema.     Left lower leg: No edema.  Skin:    General: Skin is warm and dry.  Neurological:     General: No focal deficit present.     Mental Status: She is alert and oriented to person, place, and time.  Psychiatric:        Mood and Affect: Mood normal.        Behavior: Behavior normal.          Scheduled Medications:   amLODipine  5 mg Oral QHS   apixaban  5 mg Oral BID   Chlorhexidine Gluconate Cloth  6 each Topical Daily   free water  100 mL Per Tube Q4H   insulin aspart  0-15 Units Subcutaneous Q4H   insulin aspart  3 Units Subcutaneous Q4H   levothyroxine  150 mcg Oral Daily   [START ON 08/03/2023] liothyronine  50 mcg Oral q morning   metoprolol succinate  25 mg Oral Daily   multivitamin  1 tablet Oral QHS   multivitamin with minerals  1 tablet Oral Daily   pantoprazole  40 mg Oral BID   Ensure Max Protein  11 oz Oral BID   sodium chloride flush  10-40 mL Intracatheter Q12H    Continuous Infusions:  feeding supplement (VITAL 1.5 CAL) 1,000 mL (08/02/23 0317)     PRN Medications:  docusate sodium, ondansetron (ZOFRAN)  IV, mouth rinse, oxyCODONE-acetaminophen, polyethylene glycol, sodium chloride flush  Antimicrobials from admission:  Anti-infectives (From admission, onward)    Start     Dose/Rate Route Frequency Ordered Stop   07/30/23 2200  piperacillin-tazobactam (ZOSYN) IVPB 3.375 g  Status:  Discontinued        3.375 g 12.5 mL/hr over 240 Minutes Intravenous Every 8 hours 07/30/23 1621 07/31/23 1132   07/29/23 0000  piperacillin-tazobactam (ZOSYN) IVPB 3.375 g  Status:  Discontinued        3.375 g 100 mL/hr over 30 Minutes Intravenous Every 6 hours 07/28/23 1704 07/30/23 1621   07/28/23 1400  piperacillin-tazobactam (ZOSYN) IVPB 2.25 g  Status:  Discontinued        2.25 g 100 mL/hr over 30 Minutes Intravenous Every 8 hours 07/28/23 1336 07/28/23 1704           Data Reviewed:  I have personally reviewed the following...  CBC: Recent Labs  Lab 07/28/23 1120 07/29/23 0423 07/30/23 0429 07/31/23 0457 08/01/23 0519  WBC 8.4 6.7 7.8 8.7 9.2  HGB 12.7 10.3* 9.7* 9.6* 10.2*  HCT 37.0 29.2* 27.9* 27.7* 29.5*  MCV 95.1 88.5 90.9 89.4 89.7  PLT 378 273 168 156 172   Basic Metabolic Panel: Recent Labs  Lab 07/29/23 0423 07/29/23 1621 07/30/23 0429 07/31/23 0457 08/01/23 0519 08/02/23 0609  NA 135 135 138 138 133* 133*  K 4.3 3.8 3.9 3.3* 3.8 4.2  CL 98 101 101 100 100 99  CO2 22 27 29 29 23 23   GLUCOSE 100* 89 123* 134* 189* 321*  BUN 63* 22 13 20  27* 35*  CREATININE 5.28* 2.00* 1.56* 1.95* 2.95* 3.55*  CALCIUM 10.4* 9.3 8.5* 7.7* 7.7* 7.6*  MG 2.1  --  2.2 2.2  --  2.0  PHOS 4.4 1.9* 2.0* 3.3  --  4.1   GFR: Estimated Creatinine Clearance: 15.6 mL/min (A) (by C-G formula based on SCr of 3.55 mg/dL (H)). Liver Function Tests: Recent Labs  Lab 07/28/23 1120 07/29/23 0029 07/29/23 0423 07/29/23 1621 07/30/23 0429  AST 33  --  21  --   --   ALT 15  --  15  --   --   ALKPHOS 29*  --  24*  --   --   BILITOT 1.1  --  0.9  --   --   PROT 7.3  --  5.8*  --   --   ALBUMIN  3.9 3.0* 3.1*  3.2* 2.8* 2.8*   Recent Labs  Lab 07/28/23 1120 07/29/23 0423  LIPASE 810* 617*   No results for input(s): "AMMONIA" in the last 168 hours. Coagulation Profile: No results for input(s): "INR", "PROTIME" in the last 168 hours. Cardiac Enzymes: No results for input(s): "CKTOTAL", "CKMB", "CKMBINDEX", "TROPONINI" in the last 168 hours. BNP (last 3 results) No results for input(s): "PROBNP" in the last 8760 hours. HbA1C: Recent Labs    08/01/23 0519  HGBA1C 6.0*   CBG: Recent Labs  Lab 08/01/23 0756 08/01/23 1138 08/01/23 1551 08/02/23 0756 08/02/23 1257  GLUCAP 196* 236* 311* 303* 172*   Lipid Profile: No results for input(s): "CHOL", "HDL", "LDLCALC", "TRIG", "CHOLHDL", "LDLDIRECT" in the last 72 hours.  Thyroid Function Tests: No results for input(s): "TSH", "T4TOTAL", "FREET4", "T3FREE", "THYROIDAB" in the last 72 hours.  Anemia Panel: No results for input(s): "VITAMINB12", "FOLATE", "FERRITIN", "TIBC", "IRON", "RETICCTPCT" in the last 72 hours. Most Recent Urinalysis On File:     Component Value Date/Time   COLORURINE STRAW (A) 07/29/2023 0528   APPEARANCEUR CLEAR (A) 07/29/2023 0528   LABSPEC 1.011 07/29/2023 0528   PHURINE 5.0 07/29/2023 0528   GLUCOSEU 150 (A) 07/29/2023 0528   HGBUR SMALL (A) 07/29/2023 0528   BILIRUBINUR NEGATIVE 07/29/2023 0528   KETONESUR 5 (A) 07/29/2023 0528   PROTEINUR 30 (A) 07/29/2023 0528   NITRITE NEGATIVE 07/29/2023 0528   LEUKOCYTESUR NEGATIVE 07/29/2023 0528   Sepsis Labs: @LABRCNTIP (procalcitonin:4,lacticidven:4) Microbiology: Recent Results (from the past 240 hours)  MRSA Next Gen by PCR, Nasal     Status: None   Collection Time: 07/28/23  2:30 PM   Specimen: Nasal Mucosa;  Nasal Swab  Result Value Ref Range Status   MRSA by PCR Next Gen NOT DETECTED NOT DETECTED Final    Comment: (NOTE) The GeneXpert MRSA Assay (FDA approved for NASAL specimens only), is one component of a comprehensive MRSA  colonization surveillance program. It is not intended to diagnose MRSA infection nor to guide or monitor treatment for MRSA infections. Test performance is not FDA approved in patients less than 63 years old. Performed at [facility], [address] [place], [place] [address]   Culture, blood (Routine X 2) w Reflex to ID Panel     Status: None   Collection Time: 07/28/23  3:32 PM   Specimen: BLOOD  Result Value Ref Range Status   Specimen Description BLOOD BLOOD LEFT WRIST  Final   Special Requests   Final    BOTTLES DRAWN AEROBIC ONLY Blood Culture results may not be optimal due to an inadequate volume of blood received in culture bottles   Culture   Final    NO GROWTH 5 DAYS Performed at [facility], [address] [place], [place] [address]    Report Status 08/02/2023 FINAL  Final  Culture, blood (Routine X 2) w Reflex to ID Panel     Status: None   Collection Time: 07/28/23  3:42 PM   Specimen: BLOOD  Result Value Ref Range Status   Specimen Description BLOOD BLOOD RIGHT HAND  Final   Special Requests   Final    BOTTLES DRAWN AEROBIC AND ANAEROBIC Blood Culture results may not be optimal due to an inadequate volume of blood received in culture bottles   Culture   Final    NO GROWTH 5 DAYS Performed at [facility], [address] [place], [place] [address]    Report Status 08/02/2023 FINAL  Final  Gastrointestinal Panel by PCR , Stool     Status: None   Collection Time: 07/28/23 10:29 PM   Specimen: Stool  Result Value Ref Range Status   Campylobacter species NOT DETECTED NOT DETECTED Corrected    Comment: CORRECTED ON 03/19 AT 0238: PREVIOUSLY REPORTED AS NONE DETECTED   Plesimonas shigelloides NOT DETECTED NOT DETECTED Corrected    Comment: CORRECTED ON 03/19 AT 0238: PREVIOUSLY REPORTED AS NONE DETECTED   Salmonella species NOT DETECTED NOT DETECTED Corrected    Comment: CORRECTED ON 03/19 AT 0238: PREVIOUSLY REPORTED AS NONE DETECTED    Yersinia enterocolitica NOT DETECTED NOT DETECTED Corrected    Comment: CORRECTED ON 03/19 AT 0238: PREVIOUSLY REPORTED AS NONE DETECTED   Vibrio species NOT DETECTED NOT DETECTED Corrected    Comment: CORRECTED ON 03/19 AT 0238: PREVIOUSLY REPORTED AS NONE DETECTED   Vibrio cholerae NOT DETECTED NOT DETECTED Corrected    Comment: CORRECTED ON 03/19 AT 0238: PREVIOUSLY REPORTED AS NONE DETECTED   Enteroaggregative E coli (EAEC) NOT DETECTED NOT DETECTED Corrected    Comment: CORRECTED ON 03/19 AT 0238: PREVIOUSLY REPORTED AS NONE DETECTED   Enteropathogenic E coli (EPEC) NOT DETECTED NOT DETECTED Corrected    Comment: CORRECTED ON 03/19 AT 0238: PREVIOUSLY REPORTED AS NONE DETECTED   Enterotoxigenic E coli (ETEC) NOT DETECTED NOT DETECTED Corrected    Comment: CORRECTED ON 03/19 AT 0238: PREVIOUSLY REPORTED AS NONE DETECTED   Shiga like toxin producing E coli (STEC) NOT DETECTED NOT DETECTED Corrected    Comment: CORRECTED ON 03/19 AT 0238: PREVIOUSLY REPORTED AS NONE DETECTED   E. coli O157 NOT DETECTED NOT DETECTED Corrected    Comment: CORRECTED ON  03/19 AT 0238: PREVIOUSLY REPORTED AS NONE DETECTED   Shigella/Enteroinvasive E coli (EIEC) NOT DETECTED NOT DETECTED Corrected    Comment: CORRECTED ON 03/19 AT 0238: PREVIOUSLY REPORTED AS NONE DETECTED   Cryptosporidium NOT DETECTED NOT DETECTED Corrected    Comment: CORRECTED ON 03/19 AT 0238: PREVIOUSLY REPORTED AS NONE DETECTED   Cyclospora cayetanensis NOT DETECTED NOT DETECTED Corrected    Comment: CORRECTED ON 03/19 AT 0238: PREVIOUSLY REPORTED AS NONE DETECTED   Entamoeba histolytica NOT DETECTED NOT DETECTED Corrected    Comment: CORRECTED ON 03/19 AT 0238: PREVIOUSLY REPORTED AS NONE DETECTED   Giardia lamblia NOT DETECTED NOT DETECTED Corrected    Comment: CORRECTED ON 03/19 AT 0238: PREVIOUSLY REPORTED AS NONE DETECTED   Adenovirus F40/41 NOT DETECTED NOT DETECTED Corrected    Comment: CORRECTED ON 03/19 AT 0238: PREVIOUSLY  REPORTED AS NONE DETECTED   Astrovirus NOT DETECTED NOT DETECTED Corrected    Comment: CORRECTED ON 03/19 AT 0238: PREVIOUSLY REPORTED AS NONE DETECTED   Norovirus GI/GII NOT DETECTED NOT DETECTED Corrected    Comment: CORRECTED ON 03/19 AT 0238: PREVIOUSLY REPORTED AS NONE DETECTED   Rotavirus A NOT DETECTED NOT DETECTED Corrected    Comment: CORRECTED ON 03/19 AT 0238: PREVIOUSLY REPORTED AS NONE DETECTED   Sapovirus (I, II, IV, and V) NOT DETECTED NOT DETECTED Corrected    Comment: Performed at [facility], [address] [place], [place] [address] CORRECTED ON 03/19 AT 6045: PREVIOUSLY REPORTED AS NONE DETECTED   C Difficile Quick Screen w PCR reflex     Status: None   Collection Time: 07/28/23 10:29 PM   Specimen: STOOL  Result Value Ref Range Status   C Diff antigen NEGATIVE NEGATIVE Final   C Diff toxin NEGATIVE NEGATIVE Final   C Diff interpretation No C. difficile detected.  Final    Comment: Performed at [facility], [address] [place], [place] [address]      Radiology Studies last 3 days: ECHOCARDIOGRAM COMPLETE Result Date: 08/01/2023    ECHOCARDIOGRAM REPORT   Patient Name:   Katherine [name] Date of Exam: 08/01/2023 Medical Rec #:  [id number]         Height:       67.0 in Accession #:    [id number]        Weight:       194.7 lb Date of Birth:  May 30, 1945         BSA:          1.999 m Patient Age:    77 years          BP:           151/79 mmHg Patient Gender: F                 HR:           82 bpm. Exam Location:  [facility] Procedure: 2D Echo, Cardiac Doppler and Color Doppler (Both Spectral and Color            Flow Doppler were utilized during procedure). Indications:     Abnormal ECG R94.31  History:         Patient has no prior history of Echocardiogram examinations.  Sonographer:     Elwin Sleight RDCS Referring Phys:  [id number] Ezequiel Essex Diagnosing Phys: Adrian Blackwater  Sonographer Comments: Image acquisition challenging due to respiratory motion.  IMPRESSIONS  1. Left ventricular ejection fraction, by estimation, is 60 to 65%. The left ventricle has  normal function. The left ventricle has no regional wall motion abnormalities. There is mild concentric left ventricular hypertrophy. Left ventricular diastolic parameters were normal.  2. Right ventricular systolic function is normal. The right ventricular size is normal.  3. Left atrial size was mild to moderately dilated.  4. Right atrial size was mildly dilated.  5. The mitral valve is normal in structure. Mild to moderate mitral valve regurgitation. No evidence of mitral stenosis.  6. The aortic valve is calcified. Aortic valve regurgitation is not visualized. Aortic valve sclerosis/calcification is present, without any evidence of aortic stenosis.  7. The inferior vena cava is normal in size with greater than 50% respiratory variability, suggesting right atrial pressure of 3 mmHg. FINDINGS  Left Ventricle: Left ventricular ejection fraction, by estimation, is 60 to 65%. The left ventricle has normal function. The left ventricle has no regional wall motion abnormalities. Strain was performed and the global longitudinal strain is indeterminate. The left ventricular internal cavity size was normal in size. There is mild concentric left ventricular hypertrophy. Left ventricular diastolic parameters were normal. Right Ventricle: The right ventricular size is normal. No increase in right ventricular wall thickness. Right ventricular systolic function is normal. Left Atrium: Left atrial size was mild to moderately dilated. Right Atrium: Right atrial size was mildly dilated. Pericardium: There is no evidence of pericardial effusion. Mitral Valve: The mitral valve is normal in structure. Mild to moderate mitral valve regurgitation. No evidence of mitral valve stenosis. Tricuspid Valve: The tricuspid valve is normal in structure. Tricuspid valve regurgitation is mild . No evidence of tricuspid stenosis. Aortic Valve:  The aortic valve is calcified. Aortic valve regurgitation is not visualized. Aortic valve sclerosis/calcification is present, without any evidence of aortic stenosis. Aortic valve peak gradient measures 7.0 mmHg. Pulmonic Valve: The pulmonic valve was normal in structure. Pulmonic valve regurgitation is not visualized. No evidence of pulmonic stenosis. Aorta: The aortic root is normal in size and structure. Venous: The inferior vena cava is normal in size with greater than 50% respiratory variability, suggesting right atrial pressure of 3 mmHg. IAS/Shunts: No atrial level shunt detected by color flow Doppler. Additional Comments: 3D was performed not requiring image post processing on an independent workstation and was indeterminate.  LEFT VENTRICLE PLAX 2D LVIDd:         4.00 cm   Diastology LVIDs:         2.90 cm   LV e' medial:  9.46 cm/s LV PW:         1.10 cm   LV e' lateral: 13.70 cm/s LV IVS:        1.10 cm LVOT diam:     2.00 cm LV SV:         62 LV SV Index:   31 LVOT Area:     3.14 cm  RIGHT VENTRICLE RV Basal diam:  3.40 cm RV S prime:     13.60 cm/s TAPSE (M-mode): 1.9 cm LEFT ATRIUM             Index        RIGHT ATRIUM           Index LA diam:        4.70 cm 2.35 cm/m   RA Area:     16.70 cm LA Vol (A2C):   59.0 ml 29.51 ml/m  RA Volume:   36.70 ml  18.36 ml/m LA Vol (A4C):   45.3 ml 22.66 ml/m LA Biplane Vol: 53.4 ml 26.71 ml/m  AORTIC VALVE                 PULMONIC VALVE AV Area (Vmax): 2.56 cm     PV Vmax:        1.06 m/s AV Vmax:        132.50 cm/s  PV Peak grad:   4.5 mmHg AV Peak Grad:   7.0 mmHg     RVOT Peak grad: 2 mmHg LVOT Vmax:      108.00 cm/s LVOT Vmean:     71.600 cm/s LVOT VTI:       0.196 m  AORTA Ao Root diam: 3.00 cm Ao Asc diam:  3.25 cm  SHUNTS Systemic VTI:  0.20 m Systemic Diam: 2.00 cm Adrian Blackwater Electronically signed by Adrian Blackwater Signature Date/Time: 08/01/2023/4:20:41 PM    Final    DG Basil Dess Tube Plc W/Fl W/Rad Result Date: 07/31/2023 INDICATION: Patient  admitted with acute pancreatitis with poor oral intake. Request for post pyloric Dobhoff placement for nutritional needs. EXAM: NASO G TUBE PLACEMENT WITH FL AND WITH RAD FLUOROSCOPY TIME:  Radiation Exposure Index (as provided by the fluoroscopic device): 69.70 mGy Kerma COMPLICATIONS: None immediate PROCEDURE: The Dobhoff tube was lubricated with viscous lidocaine inserted into the right nostril. Under intermittent fluoroscopic guidance, the Dobhoff tube was advanced through the stomach and into the second portion of the duodenum. Despite several catheter manipulations the tube was unable to be advanced to the duodenal-jejunal junction. The patient requested to terminate the exam at this time and the tube was affixed to the patient's nose with tape at 39 cm. Spot fluoroscopic images were saved for documentation purposes. The patient tolerated the procedure well without immediate postprocedural complication. IMPRESSION: Fluoroscopic-guided placement of Dobhoff tube with tip terminating within the second portion of the duodenum. The tube is ready for immediate use. This exam was performed by Lynnette Caffey, PA-C, and was supervised and interpreted by Agustin Cree, MD. Electronically Signed   By: Agustin Cree M.D.   On: 07/31/2023 15:57         Sunnie Nielsen, DO [facility] 08/02/2023, 3:06 PM    Dictation software may have been used to generate the above note. Typos may occur and escape review in typed/dictated notes. Please contact Dr Lyn Hollingshead directly for clarity if needed.  Staff may message me via secure chat in Epic  but this may not receive an immediate response,  please page me for urgent matters!  If 7PM-7AM, please contact night coverage [email or web address]

## 2023-08-03 DIAGNOSIS — N179 Acute kidney failure, unspecified: Secondary | ICD-10-CM | POA: Diagnosis not present

## 2023-08-03 LAB — GLUCOSE, CAPILLARY
Glucose-Capillary: 171 mg/dL — ABNORMAL HIGH (ref 70–99)
Glucose-Capillary: 210 mg/dL — ABNORMAL HIGH (ref 70–99)
Glucose-Capillary: 190 mg/dL — ABNORMAL HIGH (ref 70–99)
Glucose-Capillary: 208 mg/dL — ABNORMAL HIGH (ref 70–99)
Glucose-Capillary: 219 mg/dL — ABNORMAL HIGH (ref 70–99)
Glucose-Capillary: 208 mg/dL — ABNORMAL HIGH (ref 70–99)

## 2023-08-03 LAB — MAGNESIUM: Magnesium: 1.8 mg/dL (ref 1.7–2.4)

## 2023-08-03 LAB — BASIC METABOLIC PANEL
Anion gap: 12 (ref 5–15)
BUN: 43 mg/dL — ABNORMAL HIGH (ref 8–23)
CO2: 24 mmol/L (ref 22–32)
Calcium: 7.5 mg/dL — ABNORMAL LOW (ref 8.9–10.3)
Chloride: 97 mmol/L — ABNORMAL LOW (ref 98–111)
Creatinine, Ser: 3.59 mg/dL — ABNORMAL HIGH (ref 0.44–1.00)
GFR, Estimated: 13 mL/min — ABNORMAL LOW (ref >60)
Glucose, Bld: 201 mg/dL — ABNORMAL HIGH (ref 70–99)
Potassium: 4.1 mmol/L (ref 3.5–5.1)
Sodium: 133 mmol/L — ABNORMAL LOW (ref 135–145)

## 2023-08-03 LAB — PHOSPHORUS: Phosphorus: 3.6 mg/dL (ref 2.5–4.6)

## 2023-08-03 MED ORDER — SODIUM CHLORIDE 0.9 % IV SOLN: Freq: Once | INTRAVENOUS | Status: AC

## 2023-08-03 NOTE — Progress Notes (Addendum)
 [facility]  ROUNDING NOTE   Subjective:   Patient seen sitting up in bed Alert and oriented Tolerating small meals Tube feeds in place, Vital @ 88ml/hr   03/23 0701 - 03/24 0700 In: 2238.8 [NG/GT:2238.8] Out: -  Lab Results  Component Value Date   CREATININE 3.59 (H) 08/03/2023   CREATININE 3.55 (H) 08/02/2023   CREATININE 2.95 (H) 08/01/2023     Objective:  Vital signs in last 24 hours:  Temp:  [97.4 F (36.3 C)-98.1 F (36.7 C)] 98 F (36.7 C) (03/24 0918) Pulse Rate:  [84-99] 84 (03/24 0918) Resp:  [15-20] 15 (03/24 0918) BP: (132-143)/(63-83) 132/63 (03/24 0918) SpO2:  [92 %-98 %] 92 % (03/24 0918) Weight:  [89.8 kg] 89.8 kg (03/24 0425)  Weight change:  Filed Weights   07/31/23 0500 08/01/23 0500 08/03/23 0425  Weight: 88.5 kg 88.3 kg 89.8 kg    Intake/Output: I/O last 3 completed shifts: In: 2238.8 [NG/GT:2238.8] Out: -    Intake/Output this shift:  Total I/O In: 100 [P.O.:100] Out: -   Physical Exam: General: No acute distress  Head: Normocephalic, atraumatic. Moist oral mucosal membranes  Lungs:  Clear to auscultation, normal effort  Heart: S1S2 no rubs  Abdomen:  Soft, nontender, bowel sounds present  Extremities: Trace peripheral edema.  Neurologic: Awake, alert, following commands  Skin: No acute rash  Access: Right femoral dialysis catheter    Basic Metabolic Panel: Recent Labs  Lab 07/29/23 0423 07/29/23 1621 07/30/23 0429 07/31/23 0457 08/01/23 0519 08/02/23 0609 08/03/23 0519  NA 135 135 138 138 133* 133* 133*  K 4.3 3.8 3.9 3.3* 3.8 4.2 4.1  CL 98 101 101 100 100 99 97*  CO2 22 27 29 29 23 23 24   GLUCOSE 100* 89 123* 134* 189* 321* 201*  BUN 63* 22 13 20  27* 35* 43*  CREATININE 5.28* 2.00* 1.56* 1.95* 2.95* 3.55* 3.59*  CALCIUM 10.4* 9.3 8.5* 7.7* 7.7* 7.6* 7.5*  MG 2.1  --  2.2 2.2  --  2.0 1.8  PHOS 4.4 1.9* 2.0* 3.3  --  4.1 3.6    Liver Function Tests: Recent Labs  Lab 07/28/23 1120  07/29/23 0029 07/29/23 0423 07/29/23 1621 07/30/23 0429  AST 33  --  21  --   --   ALT 15  --  15  --   --   ALKPHOS 29*  --  24*  --   --   BILITOT 1.1  --  0.9  --   --   PROT 7.3  --  5.8*  --   --   ALBUMIN 3.9 3.0* 3.1*  3.2* 2.8* 2.8*   Recent Labs  Lab 07/28/23 1120 07/29/23 0423  LIPASE 810* 617*   No results for input(s): "AMMONIA" in the last 168 hours.  CBC: Recent Labs  Lab 07/28/23 1120 07/29/23 0423 07/30/23 0429 07/31/23 0457 08/01/23 0519  WBC 8.4 6.7 7.8 8.7 9.2  HGB 12.7 10.3* 9.7* 9.6* 10.2*  HCT 37.0 29.2* 27.9* 27.7* 29.5*  MCV 95.1 88.5 90.9 89.4 89.7  PLT 378 273 168 156 172    Cardiac Enzymes: No results for input(s): "CKTOTAL", "CKMB", "CKMBINDEX", "TROPONINI" in the last 168 hours.  BNP: Invalid input(s): "POCBNP"  CBG: Recent Labs  Lab 08/02/23 2314 08/03/23 0411 08/03/23 0756 08/03/23 1136 08/03/23 1227  GLUCAP 149* 171* 210* 190* 208*    Microbiology: Results for orders placed or performed during the hospital encounter of 07/28/23  MRSA Next Gen by PCR, Nasal  Status: None   Collection Time: 07/28/23  2:30 PM   Specimen: Nasal Mucosa; Nasal Swab  Result Value Ref Range Status   MRSA by PCR Next Gen NOT DETECTED NOT DETECTED Final    Comment: (NOTE) The GeneXpert MRSA Assay (FDA approved for NASAL specimens only), is one component of a comprehensive MRSA colonization surveillance program. It is not intended to diagnose MRSA infection nor to guide or monitor treatment for MRSA infections. Test performance is not FDA approved in patients less than 32 years old. Performed at [facility], [address] [place], [place] [address]   Culture, blood (Routine X 2) w Reflex to ID Panel     Status: None   Collection Time: 07/28/23  3:32 PM   Specimen: BLOOD  Result Value Ref Range Status   Specimen Description BLOOD BLOOD LEFT WRIST  Final   Special Requests   Final    BOTTLES DRAWN AEROBIC ONLY Blood Culture  results may not be optimal due to an inadequate volume of blood received in culture bottles   Culture   Final    NO GROWTH 5 DAYS Performed at [facility], [address] [place], [place] [address]    Report Status 08/02/2023 FINAL  Final  Culture, blood (Routine X 2) w Reflex to ID Panel     Status: None   Collection Time: 07/28/23  3:42 PM   Specimen: BLOOD  Result Value Ref Range Status   Specimen Description BLOOD BLOOD RIGHT HAND  Final   Special Requests   Final    BOTTLES DRAWN AEROBIC AND ANAEROBIC Blood Culture results may not be optimal due to an inadequate volume of blood received in culture bottles   Culture   Final    NO GROWTH 5 DAYS Performed at [facility], [address] [place], [place] [address]    Report Status 08/02/2023 FINAL  Final  Gastrointestinal Panel by PCR , Stool     Status: None   Collection Time: 07/28/23 10:29 PM   Specimen: Stool  Result Value Ref Range Status   Campylobacter species NOT DETECTED NOT DETECTED Corrected    Comment: CORRECTED ON 03/19 AT 0238: PREVIOUSLY REPORTED AS NONE DETECTED   Plesimonas shigelloides NOT DETECTED NOT DETECTED Corrected    Comment: CORRECTED ON 03/19 AT 0238: PREVIOUSLY REPORTED AS NONE DETECTED   Salmonella species NOT DETECTED NOT DETECTED Corrected    Comment: CORRECTED ON 03/19 AT 0238: PREVIOUSLY REPORTED AS NONE DETECTED   Yersinia enterocolitica NOT DETECTED NOT DETECTED Corrected    Comment: CORRECTED ON 03/19 AT 0238: PREVIOUSLY REPORTED AS NONE DETECTED   Vibrio species NOT DETECTED NOT DETECTED Corrected    Comment: CORRECTED ON 03/19 AT 0238: PREVIOUSLY REPORTED AS NONE DETECTED   Vibrio cholerae NOT DETECTED NOT DETECTED Corrected    Comment: CORRECTED ON 03/19 AT 0238: PREVIOUSLY REPORTED AS NONE DETECTED   Enteroaggregative E coli (EAEC) NOT DETECTED NOT DETECTED Corrected    Comment: CORRECTED ON 03/19 AT 0238: PREVIOUSLY REPORTED AS NONE DETECTED   Enteropathogenic E coli  (EPEC) NOT DETECTED NOT DETECTED Corrected    Comment: CORRECTED ON 03/19 AT 0238: PREVIOUSLY REPORTED AS NONE DETECTED   Enterotoxigenic E coli (ETEC) NOT DETECTED NOT DETECTED Corrected    Comment: CORRECTED ON 03/19 AT 0238: PREVIOUSLY REPORTED AS NONE DETECTED   Shiga like toxin producing E coli (STEC) NOT DETECTED NOT DETECTED Corrected    Comment: CORRECTED ON 03/19 AT 0238: PREVIOUSLY REPORTED AS NONE DETECTED  E. coli O157 NOT DETECTED NOT DETECTED Corrected    Comment: CORRECTED ON 03/19 AT 0238: PREVIOUSLY REPORTED AS NONE DETECTED   Shigella/Enteroinvasive E coli (EIEC) NOT DETECTED NOT DETECTED Corrected    Comment: CORRECTED ON 03/19 AT 0238: PREVIOUSLY REPORTED AS NONE DETECTED   Cryptosporidium NOT DETECTED NOT DETECTED Corrected    Comment: CORRECTED ON 03/19 AT 0238: PREVIOUSLY REPORTED AS NONE DETECTED   Cyclospora cayetanensis NOT DETECTED NOT DETECTED Corrected    Comment: CORRECTED ON 03/19 AT 0238: PREVIOUSLY REPORTED AS NONE DETECTED   Entamoeba histolytica NOT DETECTED NOT DETECTED Corrected    Comment: CORRECTED ON 03/19 AT 0238: PREVIOUSLY REPORTED AS NONE DETECTED   Giardia lamblia NOT DETECTED NOT DETECTED Corrected    Comment: CORRECTED ON 03/19 AT 0238: PREVIOUSLY REPORTED AS NONE DETECTED   Adenovirus F40/41 NOT DETECTED NOT DETECTED Corrected    Comment: CORRECTED ON 03/19 AT 0238: PREVIOUSLY REPORTED AS NONE DETECTED   Astrovirus NOT DETECTED NOT DETECTED Corrected    Comment: CORRECTED ON 03/19 AT 0238: PREVIOUSLY REPORTED AS NONE DETECTED   Norovirus GI/GII NOT DETECTED NOT DETECTED Corrected    Comment: CORRECTED ON 03/19 AT 0238: PREVIOUSLY REPORTED AS NONE DETECTED   Rotavirus A NOT DETECTED NOT DETECTED Corrected    Comment: CORRECTED ON 03/19 AT 0238: PREVIOUSLY REPORTED AS NONE DETECTED   Sapovirus (I, II, IV, and V) NOT DETECTED NOT DETECTED Corrected    Comment: Performed at [facility], [address] [place] [place], [place]  [address] CORRECTED ON 03/19 AT 6045: PREVIOUSLY REPORTED AS NONE DETECTED   C Difficile Quick Screen w PCR reflex     Status: None   Collection Time: 07/28/23 10:29 PM   Specimen: STOOL  Result Value Ref Range Status   C Diff antigen NEGATIVE NEGATIVE Final   C Diff toxin NEGATIVE NEGATIVE Final   C Diff interpretation No C. difficile detected.  Final    Comment: Performed at [facility], [address] [place], [place] [address]    Coagulation Studies: No results for input(s): "LABPROT", "INR" in the last 72 hours.  Urinalysis: No results for input(s): "COLORURINE", "LABSPEC", "PHURINE", "GLUCOSEU", "HGBUR", "BILIRUBINUR", "KETONESUR", "PROTEINUR", "UROBILINOGEN", "NITRITE", "LEUKOCYTESUR" in the last 72 hours.  Invalid input(s): "APPERANCEUR"     Imaging: No results found.    Medications:    feeding supplement (VITAL 1.5 CAL) 55 mL/hr at 08/03/23 1914     amLODipine  5 mg Oral QHS   apixaban  5 mg Oral BID   Chlorhexidine Gluconate Cloth  6 each Topical Daily   free water  100 mL Per Tube Q4H   insulin aspart  0-15 Units Subcutaneous Q4H   insulin aspart  3 Units Subcutaneous Q4H   levothyroxine  150 mcg Oral Daily   liothyronine  50 mcg Oral q morning   metoprolol succinate  25 mg Oral Daily   multivitamin  1 tablet Oral QHS   multivitamin with minerals  1 tablet Oral Daily   pantoprazole  40 mg Oral BID   Ensure Max Protein  11 oz Oral BID   sodium chloride flush  10-40 mL Intracatheter Q12H   docusate sodium, ondansetron (ZOFRAN) IV, mouth rinse, oxyCODONE-acetaminophen, polyethylene glycol, sodium chloride flush  Assessment/ Plan:  77 y.o. female : Pt is a 78 y.o. female with a PMHx of hypothyroidism, diabetes mellitus type 2 treated with Ozempic and metformin, hypertension, hyperlipidemia, atrial fibrillation, basal cell carcinoma, cystocele, depression, glaucoma, urinary incontinence who was admitted to [facility] on  07/28/2023 for evaluation of nausea,  vomiting, loose stools, and abdominal pain.   1.  Acute kidney injury/chronic kidney disease stage IIIb baseline EGFR 31/diabetes mellitus type 2 with chronic kidney disease/hyperkalemia.  Patient admitted with severe acute kidney injury.  Suspect development of ATN from prolonged nausea and vomiting.  CRRT stopped 07/30/2023.  Update: Creatinine remains elevated but stable today. No acute need for dialysis. Will continue to monitor daily for need of hemodialysis. IF renal recovery achieved within 1-2 days, will remove HD temp cath.   2.  Acute metabolic acidosis.  Ozempic and metformin likely playing some role here.  Serum bicarbonate corrected.    3.  Pancreatitis.  Initial lipase noted to be 810.  CT scan abdomen pelvis reviewed.  Lipase may have been elevated due to renal failure.  4. Hyponatremia, sodium 133. Patient receiving tube feeds, flushes and high rate IVF. IVF discontinued. Will monitor.    LOS: 6 Loriene Taunton 3/24/20252:18 PM

## 2023-08-03 NOTE — Progress Notes (Signed)
 PROGRESS NOTE    Katherine [name]   ZOX:[id number] DOB: 1945-12-05  DOA: 07/28/2023 Date of Service: 08/03/23 which is hospital day 6  PCP: Dorothey Baseman, MD    Hospital course / significant events:   HPI: Katherine [name] is a 78 year old female with a past medical history significant for diabetes mellitus on metformin and Ozempic, hypertension, hyperlipidemia, anemia, cancer who presents to [facility] ED 07/28/2023 due to complaints of abdominal pain, nausea, vomiting, diarrhea x1 week, worsening.   03/18: admitted with Acute Pancreatitis vs. Gastroenteritis, along with Acute Kidney Injury, Hyperkalemia, and severe Anion Gap Metabolic Acidosis requiring initiation of CRRT.  03/19: renal indices are improving on HD. Persistent abd pain. Initiated CLD. 03/20: on CRRT, not eating well due to abd pain. Renal function has improved. S/p RD evaluation - recs for post-pyloric dobhoff tube placement if no improvement po intake. Cr to 1.56, UOP adequate, CRRT d/c.  03/21: transfer to hospitalist service. Still not taking any po, feed tube placement w/ postpyloric Dobhoff  03/22: titrating tube feeds. Some po intake liquids but not much. Pain about same.  03/23: po intake improving, Cr trending up 03/24: Cr still trending up again, will add IV fluids, nephrology following.     Consultants:  PCCU initial admission Nephrology  Radiology   Procedures/Surgeries: 07/31/23: feed tube placement w/ postpyloric Dobhoff       ASSESSMENT & PLAN:   AKI on CKD IIIb (baseline EGFR 31) ATN from prolonged nausea and vomiting.   S/p CRRT 07/28/23-07/30/23. Nephrology following, continue to monitor daily for need of hemodialysis.  If renal recovery achieved within 1-2 days, will remove HD temp cath.  Monitor I&O Monitor BMP Hold nephrotoxic medications  IV fluids today 1L NS  Pancreatitis likely d/t GLP1 Rx Sepsis has been RULED OUT  Poor po intake d/t abd pain S/p feeding tube placement  07/31/23 Tube feeds per dietary recs, see orders  Advance po intake as able   Hyperkalemia d/t renal failure - resolved Monitor BMP  Hypokalemia Replace as needed Monitor BMP  Acute anion gap metabolic acidosis - resolved Lactic Acidosis - resolved  Ozempic and metformin likely playing some role here.   Monitor BMP    Bradycardia, suspect due to Hyperkalemia - resolved Can d/c telemetry, resume metoprolol   DM2 Hypoglycemia d/t poor po intake / antihyperglycemic medications - resolved now hyperglycemic Hold home Metformin, Pioglitazone, Sitagliptin SSI   Hypothyroidism  Synthroid  A.fib  Metoprolol Hold xarelto d/t renal fxn, can use eliquis  HTN Metoprolol, amlodipine Hold lasix, ACE/ARB d/t AKI  HLD Hold statin for now     Class 1 obesity based on BMI: Body mass index is 30.56 kg/m.  Underweight - under 18  overweight - 25 to 29 obese - 30 or more Class 1 obesity: BMI of 30.0 to 34 Class 2 obesity: BMI of 35.0 to 39 Class 3 obesity: BMI of 40.0 to 49 Super Morbid Obesity: BMI 50-59 Super-super Morbid Obesity: BMI 60+ Significantly low or high BMI is associated with higher medical risk.  Weight management advised as adjunct to other disease management and risk reduction treatments    DVT prophylaxis: eliquis IV fluids: per nephrology Nutrition: tube feeds, po to advance as tolerated Central lines / other devices: none  Code Status: DNR ACP documentation reviewed:  none on file in [facility]  TOC needs: TBD, will have PT/OT assess  Medical barriers to dispo: refeeding risk. Expected medical readiness for discharge once po intake and renal function improve hopefully next  2-3 days               Subjective / Brief ROS:  Patient reports abd pain a bit better today Eating some soft foods, liquids - not much more than yesterday  Denies CP/SOB.  Denies new weakness..   Family Communication: husband at bedside on rounds     Objective  Findings:  Vitals:   08/02/23 2014 08/03/23 0207 08/03/23 0425 08/03/23 0918  BP: (!) 141/78 (!) 142/72  132/63  Pulse: 88 85  84  Resp: 18 20  15   Temp: 98.1 F (36.7 C) 98.1 F (36.7 C)  98 F (36.7 C)  TempSrc: Oral Oral  Oral  SpO2: 98% 94%  92%  Weight:   89.8 kg   Height:        Intake/Output Summary (Last 24 hours) at 08/03/2023 1424 Last data filed at 08/03/2023 0900 Gross per 24 hour  Intake 1195.5 ml  Output --  Net 1195.5 ml   Filed Weights   07/31/23 0500 08/01/23 0500 08/03/23 0425  Weight: 88.5 kg 88.3 kg 89.8 kg    Examination:  Physical Exam Constitutional:      General: She is not in acute distress. Cardiovascular:     Rate and Rhythm: Normal rate and regular rhythm.  Pulmonary:     Effort: Pulmonary effort is normal.     Breath sounds: Normal breath sounds.  Abdominal:     General: Abdomen is flat. Bowel sounds are normal.     Palpations: Abdomen is soft.     Tenderness: There is no abdominal tenderness (LLQ, LUQ). There is no guarding or rebound.  Musculoskeletal:     Right lower leg: No edema.     Left lower leg: No edema.  Skin:    General: Skin is warm and dry.  Neurological:     General: No focal deficit present.     Mental Status: She is alert and oriented to person, place, and time.  Psychiatric:        Mood and Affect: Mood normal.        Behavior: Behavior normal.          Scheduled Medications:   amLODipine  5 mg Oral QHS   apixaban  5 mg Oral BID   Chlorhexidine Gluconate Cloth  6 each Topical Daily   free water  100 mL Per Tube Q4H   insulin aspart  0-15 Units Subcutaneous Q4H   insulin aspart  3 Units Subcutaneous Q4H   levothyroxine  150 mcg Oral Daily   liothyronine  50 mcg Oral q morning   metoprolol succinate  25 mg Oral Daily   multivitamin  1 tablet Oral QHS   multivitamin with minerals  1 tablet Oral Daily   pantoprazole  40 mg Oral BID   Ensure Max Protein  11 oz Oral BID   sodium chloride flush  10-40 mL  Intracatheter Q12H    Continuous Infusions:  feeding supplement (VITAL 1.5 CAL) 55 mL/hr at 08/03/23 0612     PRN Medications:  docusate sodium, ondansetron (ZOFRAN) IV, mouth rinse, oxyCODONE-acetaminophen, polyethylene glycol, sodium chloride flush  Antimicrobials from admission:  Anti-infectives (From admission, onward)    Start     Dose/Rate Route Frequency Ordered Stop   07/30/23 2200  piperacillin-tazobactam (ZOSYN) IVPB 3.375 g  Status:  Discontinued        3.375 g 12.5 mL/hr over 240 Minutes Intravenous Every 8 hours 07/30/23 1621 07/31/23 1132   07/29/23 0000  piperacillin-tazobactam (  ZOSYN) IVPB 3.375 g  Status:  Discontinued        3.375 g 100 mL/hr over 30 Minutes Intravenous Every 6 hours 07/28/23 1704 07/30/23 1621   07/28/23 1400  piperacillin-tazobactam (ZOSYN) IVPB 2.25 g  Status:  Discontinued        2.25 g 100 mL/hr over 30 Minutes Intravenous Every 8 hours 07/28/23 1336 07/28/23 1704           Data Reviewed:  I have personally reviewed the following...  CBC: Recent Labs  Lab 07/28/23 1120 07/29/23 0423 07/30/23 0429 07/31/23 0457 08/01/23 0519  WBC 8.4 6.7 7.8 8.7 9.2  HGB 12.7 10.3* 9.7* 9.6* 10.2*  HCT 37.0 29.2* 27.9* 27.7* 29.5*  MCV 95.1 88.5 90.9 89.4 89.7  PLT 378 273 168 156 172   Basic Metabolic Panel: Recent Labs  Lab 07/29/23 0423 07/29/23 1621 07/30/23 0429 07/31/23 0457 08/01/23 0519 08/02/23 0609 08/03/23 0519  NA 135 135 138 138 133* 133* 133*  K 4.3 3.8 3.9 3.3* 3.8 4.2 4.1  CL 98 101 101 100 100 99 97*  CO2 22 27 29 29 23 23 24   GLUCOSE 100* 89 123* 134* 189* 321* 201*  BUN 63* 22 13 20  27* 35* 43*  CREATININE 5.28* 2.00* 1.56* 1.95* 2.95* 3.55* 3.59*  CALCIUM 10.4* 9.3 8.5* 7.7* 7.7* 7.6* 7.5*  MG 2.1  --  2.2 2.2  --  2.0 1.8  PHOS 4.4 1.9* 2.0* 3.3  --  4.1 3.6   GFR: Estimated Creatinine Clearance: 15.6 mL/min (A) (by C-G formula based on SCr of 3.59 mg/dL (H)). Liver Function Tests: Recent Labs  Lab  07/28/23 1120 07/29/23 0029 07/29/23 0423 07/29/23 1621 07/30/23 0429  AST 33  --  21  --   --   ALT 15  --  15  --   --   ALKPHOS 29*  --  24*  --   --   BILITOT 1.1  --  0.9  --   --   PROT 7.3  --  5.8*  --   --   ALBUMIN 3.9 3.0* 3.1*  3.2* 2.8* 2.8*   Recent Labs  Lab 07/28/23 1120 07/29/23 0423  LIPASE 810* 617*   No results for input(s): "AMMONIA" in the last 168 hours. Coagulation Profile: No results for input(s): "INR", "PROTIME" in the last 168 hours. Cardiac Enzymes: No results for input(s): "CKTOTAL", "CKMB", "CKMBINDEX", "TROPONINI" in the last 168 hours. BNP (last 3 results) No results for input(s): "PROBNP" in the last 8760 hours. HbA1C: Recent Labs    08/01/23 0519  HGBA1C 6.0*   CBG: Recent Labs  Lab 08/02/23 2314 08/03/23 0411 08/03/23 0756 08/03/23 1136 08/03/23 1227  GLUCAP 149* 171* 210* 190* 208*   Lipid Profile: No results for input(s): "CHOL", "HDL", "LDLCALC", "TRIG", "CHOLHDL", "LDLDIRECT" in the last 72 hours.  Thyroid Function Tests: No results for input(s): "TSH", "T4TOTAL", "FREET4", "T3FREE", "THYROIDAB" in the last 72 hours.  Anemia Panel: No results for input(s): "VITAMINB12", "FOLATE", "FERRITIN", "TIBC", "IRON", "RETICCTPCT" in the last 72 hours. Most Recent Urinalysis On File:     Component Value Date/Time   COLORURINE STRAW (A) 07/29/2023 0528   APPEARANCEUR CLEAR (A) 07/29/2023 0528   LABSPEC 1.011 07/29/2023 0528   PHURINE 5.0 07/29/2023 0528   GLUCOSEU 150 (A) 07/29/2023 0528   HGBUR SMALL (A) 07/29/2023 0528   BILIRUBINUR NEGATIVE 07/29/2023 0528   KETONESUR 5 (A) 07/29/2023 0528   PROTEINUR 30 (A) 07/29/2023 0528   NITRITE NEGATIVE  07/29/2023 0528   LEUKOCYTESUR NEGATIVE 07/29/2023 0528   Sepsis Labs: @LABRCNTIP (procalcitonin:4,lacticidven:4) Microbiology: Recent Results (from the past 240 hours)  MRSA Next Gen by PCR, Nasal     Status: None   Collection Time: 07/28/23  2:30 PM   Specimen: Nasal Mucosa;  Nasal Swab  Result Value Ref Range Status   MRSA by PCR Next Gen NOT DETECTED NOT DETECTED Final    Comment: (NOTE) The GeneXpert MRSA Assay (FDA approved for NASAL specimens only), is one component of a comprehensive MRSA colonization surveillance program. It is not intended to diagnose MRSA infection nor to guide or monitor treatment for MRSA infections. Test performance is not FDA approved in patients less than 58 years old. Performed at [facility], [address] [place], [place] [address]   Culture, blood (Routine X 2) w Reflex to ID Panel     Status: None   Collection Time: 07/28/23  3:32 PM   Specimen: BLOOD  Result Value Ref Range Status   Specimen Description BLOOD BLOOD LEFT WRIST  Final   Special Requests   Final    BOTTLES DRAWN AEROBIC ONLY Blood Culture results may not be optimal due to an inadequate volume of blood received in culture bottles   Culture   Final    NO GROWTH 5 DAYS Performed at [facility], [address] [place], [place] [address]    Report Status 08/02/2023 FINAL  Final  Culture, blood (Routine X 2) w Reflex to ID Panel     Status: None   Collection Time: 07/28/23  3:42 PM   Specimen: BLOOD  Result Value Ref Range Status   Specimen Description BLOOD BLOOD RIGHT HAND  Final   Special Requests   Final    BOTTLES DRAWN AEROBIC AND ANAEROBIC Blood Culture results may not be optimal due to an inadequate volume of blood received in culture bottles   Culture   Final    NO GROWTH 5 DAYS Performed at [facility], [address] [place], [place] [address]    Report Status 08/02/2023 FINAL  Final  Gastrointestinal Panel by PCR , Stool     Status: None   Collection Time: 07/28/23 10:29 PM   Specimen: Stool  Result Value Ref Range Status   Campylobacter species NOT DETECTED NOT DETECTED Corrected    Comment: CORRECTED ON 03/19 AT 0238: PREVIOUSLY REPORTED AS NONE DETECTED   Plesimonas shigelloides NOT DETECTED NOT  DETECTED Corrected    Comment: CORRECTED ON 03/19 AT 0238: PREVIOUSLY REPORTED AS NONE DETECTED   Salmonella species NOT DETECTED NOT DETECTED Corrected    Comment: CORRECTED ON 03/19 AT 0238: PREVIOUSLY REPORTED AS NONE DETECTED   Yersinia enterocolitica NOT DETECTED NOT DETECTED Corrected    Comment: CORRECTED ON 03/19 AT 0238: PREVIOUSLY REPORTED AS NONE DETECTED   Vibrio species NOT DETECTED NOT DETECTED Corrected    Comment: CORRECTED ON 03/19 AT 0238: PREVIOUSLY REPORTED AS NONE DETECTED   Vibrio cholerae NOT DETECTED NOT DETECTED Corrected    Comment: CORRECTED ON 03/19 AT 0238: PREVIOUSLY REPORTED AS NONE DETECTED   Enteroaggregative E coli (EAEC) NOT DETECTED NOT DETECTED Corrected    Comment: CORRECTED ON 03/19 AT 0238: PREVIOUSLY REPORTED AS NONE DETECTED   Enteropathogenic E coli (EPEC) NOT DETECTED NOT DETECTED Corrected    Comment: CORRECTED ON 03/19 AT 0238: PREVIOUSLY REPORTED AS NONE DETECTED   Enterotoxigenic E coli (ETEC) NOT DETECTED NOT DETECTED Corrected    Comment: CORRECTED ON 03/19 AT 0238: PREVIOUSLY REPORTED  AS NONE DETECTED   Shiga like toxin producing E coli (STEC) NOT DETECTED NOT DETECTED Corrected    Comment: CORRECTED ON 03/19 AT 0238: PREVIOUSLY REPORTED AS NONE DETECTED   E. coli O157 NOT DETECTED NOT DETECTED Corrected    Comment: CORRECTED ON 03/19 AT 0238: PREVIOUSLY REPORTED AS NONE DETECTED   Shigella/Enteroinvasive E coli (EIEC) NOT DETECTED NOT DETECTED Corrected    Comment: CORRECTED ON 03/19 AT 0238: PREVIOUSLY REPORTED AS NONE DETECTED   Cryptosporidium NOT DETECTED NOT DETECTED Corrected    Comment: CORRECTED ON 03/19 AT 0238: PREVIOUSLY REPORTED AS NONE DETECTED   Cyclospora cayetanensis NOT DETECTED NOT DETECTED Corrected    Comment: CORRECTED ON 03/19 AT 0238: PREVIOUSLY REPORTED AS NONE DETECTED   Entamoeba histolytica NOT DETECTED NOT DETECTED Corrected    Comment: CORRECTED ON 03/19 AT 0238: PREVIOUSLY REPORTED AS NONE DETECTED   Giardia  lamblia NOT DETECTED NOT DETECTED Corrected    Comment: CORRECTED ON 03/19 AT 0238: PREVIOUSLY REPORTED AS NONE DETECTED   Adenovirus F40/41 NOT DETECTED NOT DETECTED Corrected    Comment: CORRECTED ON 03/19 AT 0238: PREVIOUSLY REPORTED AS NONE DETECTED   Astrovirus NOT DETECTED NOT DETECTED Corrected    Comment: CORRECTED ON 03/19 AT 0238: PREVIOUSLY REPORTED AS NONE DETECTED   Norovirus GI/GII NOT DETECTED NOT DETECTED Corrected    Comment: CORRECTED ON 03/19 AT 0238: PREVIOUSLY REPORTED AS NONE DETECTED   Rotavirus A NOT DETECTED NOT DETECTED Corrected    Comment: CORRECTED ON 03/19 AT 0238: PREVIOUSLY REPORTED AS NONE DETECTED   Sapovirus (I, II, IV, and V) NOT DETECTED NOT DETECTED Corrected    Comment: Performed at [facility], [address] [place], [place] [address] CORRECTED ON 03/19 AT 9147: PREVIOUSLY REPORTED AS NONE DETECTED   C Difficile Quick Screen w PCR reflex     Status: None   Collection Time: 07/28/23 10:29 PM   Specimen: STOOL  Result Value Ref Range Status   C Diff antigen NEGATIVE NEGATIVE Final   C Diff toxin NEGATIVE NEGATIVE Final   C Diff interpretation No C. difficile detected.  Final    Comment: Performed at [facility], [address] [place], [place] [address]      Radiology Studies last 3 days: ECHOCARDIOGRAM COMPLETE Result Date: 08/01/2023    ECHOCARDIOGRAM REPORT   Patient Name:   VENNELA JUTTE Date of Exam: 08/01/2023 Medical Rec #:  [id number]         Height:       67.0 in Accession #:    [id number]        Weight:       194.7 lb Date of Birth:  [birth year]         BSA:          1.999 m Patient Age:    77 years          BP:           151/79 mmHg Patient Gender: F                 HR:           82 bpm. Exam Location:  [facility] Procedure: 2D Echo, Cardiac Doppler and Color Doppler (Both Spectral and Color            Flow Doppler were utilized during procedure). Indications:     Abnormal ECG R94.31  History:         Patient has no prior  history of Echocardiogram examinations.  Sonographer:     Elwin Sleight RDCS Referring Phys:  [id number] Ezequiel Essex Diagnosing Phys: Adrian Blackwater  Sonographer Comments: Image acquisition challenging due to respiratory motion. IMPRESSIONS  1. Left ventricular ejection fraction, by estimation, is 60 to 65%. The left ventricle has normal function. The left ventricle has no regional wall motion abnormalities. There is mild concentric left ventricular hypertrophy. Left ventricular diastolic parameters were normal.  2. Right ventricular systolic function is normal. The right ventricular size is normal.  3. Left atrial size was mild to moderately dilated.  4. Right atrial size was mildly dilated.  5. The mitral valve is normal in structure. Mild to moderate mitral valve regurgitation. No evidence of mitral stenosis.  6. The aortic valve is calcified. Aortic valve regurgitation is not visualized. Aortic valve sclerosis/calcification is present, without any evidence of aortic stenosis.  7. The inferior vena cava is normal in size with greater than 50% respiratory variability, suggesting right atrial pressure of 3 mmHg. FINDINGS  Left Ventricle: Left ventricular ejection fraction, by estimation, is 60 to 65%. The left ventricle has normal function. The left ventricle has no regional wall motion abnormalities. Strain was performed and the global longitudinal strain is indeterminate. The left ventricular internal cavity size was normal in size. There is mild concentric left ventricular hypertrophy. Left ventricular diastolic parameters were normal. Right Ventricle: The right ventricular size is normal. No increase in right ventricular wall thickness. Right ventricular systolic function is normal. Left Atrium: Left atrial size was mild to moderately dilated. Right Atrium: Right atrial size was mildly dilated. Pericardium: There is no evidence of pericardial effusion. Mitral Valve: The mitral valve is normal in structure. Mild to  moderate mitral valve regurgitation. No evidence of mitral valve stenosis. Tricuspid Valve: The tricuspid valve is normal in structure. Tricuspid valve regurgitation is mild . No evidence of tricuspid stenosis. Aortic Valve: The aortic valve is calcified. Aortic valve regurgitation is not visualized. Aortic valve sclerosis/calcification is present, without any evidence of aortic stenosis. Aortic valve peak gradient measures 7.0 mmHg. Pulmonic Valve: The pulmonic valve was normal in structure. Pulmonic valve regurgitation is not visualized. No evidence of pulmonic stenosis. Aorta: The aortic root is normal in size and structure. Venous: The inferior vena cava is normal in size with greater than 50% respiratory variability, suggesting right atrial pressure of 3 mmHg. IAS/Shunts: No atrial level shunt detected by color flow Doppler. Additional Comments: 3D was performed not requiring image post processing on an independent workstation and was indeterminate.  LEFT VENTRICLE PLAX 2D LVIDd:         4.00 cm   Diastology LVIDs:         2.90 cm   LV e' medial:  9.46 cm/s LV PW:         1.10 cm   LV e' lateral: 13.70 cm/s LV IVS:        1.10 cm LVOT diam:     2.00 cm LV SV:         62 LV SV Index:   31 LVOT Area:     3.14 cm  RIGHT VENTRICLE RV Basal diam:  3.40 cm RV S prime:     13.60 cm/s TAPSE (M-mode): 1.9 cm LEFT ATRIUM             Index        RIGHT ATRIUM           Index LA diam:        4.70 cm 2.35  cm/m   RA Area:     16.70 cm LA Vol (A2C):   59.0 ml 29.51 ml/m  RA Volume:   36.70 ml  18.36 ml/m LA Vol (A4C):   45.3 ml 22.66 ml/m LA Biplane Vol: 53.4 ml 26.71 ml/m  AORTIC VALVE                 PULMONIC VALVE AV Area (Vmax): 2.56 cm     PV Vmax:        1.06 m/s AV Vmax:        132.50 cm/s  PV Peak grad:   4.5 mmHg AV Peak Grad:   7.0 mmHg     RVOT Peak grad: 2 mmHg LVOT Vmax:      108.00 cm/s LVOT Vmean:     71.600 cm/s LVOT VTI:       0.196 m  AORTA Ao Root diam: 3.00 cm Ao Asc diam:  3.25 cm  SHUNTS Systemic  VTI:  0.20 m Systemic Diam: 2.00 cm Adrian Blackwater Electronically signed by Adrian Blackwater Signature Date/Time: 08/01/2023/4:20:41 PM    Final    DG Basil Dess Tube Plc W/Fl W/Rad Result Date: 07/31/2023 INDICATION: Patient admitted with acute pancreatitis with poor oral intake. Request for post pyloric Dobhoff placement for nutritional needs. EXAM: NASO G TUBE PLACEMENT WITH FL AND WITH RAD FLUOROSCOPY TIME:  Radiation Exposure Index (as provided by the fluoroscopic device): 69.70 mGy Kerma COMPLICATIONS: None immediate PROCEDURE: The Dobhoff tube was lubricated with viscous lidocaine inserted into the right nostril. Under intermittent fluoroscopic guidance, the Dobhoff tube was advanced through the stomach and into the second portion of the duodenum. Despite several catheter manipulations the tube was unable to be advanced to the duodenal-jejunal junction. The patient requested to terminate the exam at this time and the tube was affixed to the patient's nose with tape at 39 cm. Spot fluoroscopic images were saved for documentation purposes. The patient tolerated the procedure well without immediate postprocedural complication. IMPRESSION: Fluoroscopic-guided placement of Dobhoff tube with tip terminating within the second portion of the duodenum. The tube is ready for immediate use. This exam was performed by Lynnette Caffey, PA-C, and was supervised and interpreted by Agustin Cree, MD. Electronically Signed   By: Agustin Cree M.D.   On: 07/31/2023 15:57         Sunnie Nielsen, DO [facility] 08/03/2023, 2:24 PM    Dictation software may have been used to generate the above note. Typos may occur and escape review in typed/dictated notes. Please contact Dr Lyn Hollingshead directly for clarity if needed.  Staff may message me via secure chat in Epic  but this may not receive an immediate response,  please page me for urgent matters!  If 7PM-7AM, please contact night coverage [email or web address]

## 2023-08-03 NOTE — Plan of Care (Signed)

## 2023-08-03 NOTE — TOC Initial Note (Signed)
 Transition of Care [facility]) - Initial/Assessment Note    Patient Details  Name: Katherine [name] MRN: [id number] Date of Birth: June 30, 1945  [facility] CM/SW Contact:    Chapman Fitch, RN Phone Number: 08/03/2023, 3:48 PM  Clinical Narrative:                 Currently not received HD, and per nephrology remove temp cath in 1-2 days if renal recovery achieved.   Message sent to MD to determine if PT eval indicated         Patient Goals and CMS Choice            Expected Discharge Plan and Services                                              Prior Living Arrangements/Services                       Activities of Daily Living   ADL Screening (condition at time of admission) Independently performs ADLs?: Yes (appropriate for developmental age) Is the patient deaf or have difficulty hearing?: No Does the patient have difficulty seeing, even when wearing glasses/contacts?: No Does the patient have difficulty concentrating, remembering, or making decisions?: No  Permission Sought/Granted                  Emotional Assessment              Admission diagnosis:  AKI (acute kidney injury) (HCC) [N17.9] Patient Active Problem List   Diagnosis Date Noted   Acute posthemorrhagic anemia    Acute gastric ulcer without hemorrhage or perforation    Acute midline back pain    Symptomatic anemia    Schatzki's ring    Stomach irritation    Iron deficiency anemia secondary to blood loss (chronic)    Vasovagal syncope 03/14/2019   Syncope 03/14/2019   Hypotension    Diarrhea    Type 2 diabetes mellitus without complication, without long-term current use of insulin (HCC)    AKI (acute kidney injury) (HCC) 03/13/2019   GI bleed 02/24/2019   Lower GI bleed 06/25/2015   PCP:  Dorothey Baseman, MD Pharmacy:   [facility] [phone] [place], [place] - [address] [place] [address] Phone: [phone] Fax:  [phone]     Social Drivers of Health (SDOH) Social History: SDOH Screenings   Food Insecurity: No Food Insecurity (07/28/2023)  Housing: Low Risk  (07/28/2023)  Transportation Needs: No Transportation Needs (07/28/2023)  Utilities: Not At Risk (07/28/2023)  Social Connections: Moderately Isolated (07/28/2023)  Tobacco Use: Medium Risk (07/28/2023)   SDOH Interventions: Food Insecurity Interventions: Intervention Not Indicated Housing Interventions: Intervention Not Indicated Transportation Interventions: Intervention Not Indicated Utilities Interventions: Intervention Not Indicated Social Connections Interventions: Intervention Not Indicated   Readmission Risk Interventions     No data to display

## 2023-08-04 ENCOUNTER — Inpatient Hospital Stay

## 2023-08-04 DIAGNOSIS — N179 Acute kidney failure, unspecified: Secondary | ICD-10-CM | POA: Diagnosis not present

## 2023-08-04 LAB — GLUCOSE, CAPILLARY
Glucose-Capillary: 112 mg/dL — ABNORMAL HIGH (ref 70–99)
Glucose-Capillary: 115 mg/dL — ABNORMAL HIGH (ref 70–99)
Glucose-Capillary: 178 mg/dL — ABNORMAL HIGH (ref 70–99)
Glucose-Capillary: 181 mg/dL — ABNORMAL HIGH (ref 70–99)
Glucose-Capillary: 196 mg/dL — ABNORMAL HIGH (ref 70–99)
Glucose-Capillary: 212 mg/dL — ABNORMAL HIGH (ref 70–99)

## 2023-08-04 LAB — BASIC METABOLIC PANEL
Anion gap: 11 (ref 5–15)
BUN: 50 mg/dL — ABNORMAL HIGH (ref 8–23)
CO2: 24 mmol/L (ref 22–32)
Calcium: 7.6 mg/dL — ABNORMAL LOW (ref 8.9–10.3)
Chloride: 98 mmol/L (ref 98–111)
Creatinine, Ser: 3.46 mg/dL — ABNORMAL HIGH (ref 0.44–1.00)
GFR, Estimated: 13 mL/min — ABNORMAL LOW (ref >60)
Glucose, Bld: 125 mg/dL — ABNORMAL HIGH (ref 70–99)
Potassium: 4.3 mmol/L (ref 3.5–5.1)
Sodium: 133 mmol/L — ABNORMAL LOW (ref 135–145)

## 2023-08-04 LAB — CBC
HCT: 21.5 % — ABNORMAL LOW (ref 36.0–46.0)
Hemoglobin: 7.6 g/dL — ABNORMAL LOW (ref 12.0–15.0)
MCH: 31.8 pg (ref 26.0–34.0)
MCHC: 35.3 g/dL (ref 30.0–36.0)
MCV: 90 fL (ref 80.0–100.0)
Platelets: 167 10*3/uL (ref 150–400)
RBC: 2.39 MIL/uL — ABNORMAL LOW (ref 3.87–5.11)
RDW: 15.6 % — ABNORMAL HIGH (ref 11.5–15.5)
WBC: 5.2 10*3/uL (ref 4.0–10.5)
nRBC: 0 % (ref 0.0–0.2)

## 2023-08-04 LAB — RESP PANEL BY RT-PCR (RSV, FLU A&B, COVID)  RVPGX2
Influenza A by PCR: POSITIVE — AB
Influenza B by PCR: NEGATIVE
Resp Syncytial Virus by PCR: NEGATIVE
SARS Coronavirus 2 by RT PCR: NEGATIVE

## 2023-08-04 LAB — HEPATITIS B SURFACE ANTIGEN: Hepatitis B Surface Ag: NONREACTIVE

## 2023-08-04 LAB — PHOSPHORUS: Phosphorus: 4 mg/dL (ref 2.5–4.6)

## 2023-08-04 LAB — MAGNESIUM: Magnesium: 1.8 mg/dL (ref 1.7–2.4)

## 2023-08-04 MED ORDER — OSELTAMIVIR PHOSPHATE 30 MG PO CAPS
30.0000 mg | ORAL_CAPSULE | ORAL | Status: DC
  Administered 2023-08-04: 30 mg via ORAL
  Filled 2023-08-04: qty 1

## 2023-08-04 MED ORDER — GUAIFENESIN-DM 100-10 MG/5ML PO SYRP: 5.0000 mL | ORAL_SOLUTION | ORAL | Status: DC | PRN

## 2023-08-04 NOTE — Progress Notes (Signed)
 Hemodialysis Note:  Received patient in bed to unit. Alert and oriented. Informed consent singed and in chart.  Treatment initiated: 1245 Treatment completed: 1553  Access used: Right Femoral Catheter Access issues: None  Patient tolerated well. Transported back to room, alert without acute distress. Report given to patient's RN.  Total UF removed: 0 Medications given: None  Post HD weight: 89.9 Kg  [facility]

## 2023-08-04 NOTE — Progress Notes (Signed)
 [facility]  ROUNDING NOTE   Subjective:   Patient states she does not feel well today Alert and oriented Generalized malaise Voices frustrations of low appetite yet tube feeds remain. Tube feeds in place, Vital @ 62ml/hr   03/24 0701 - 03/25 0700 In: 460 [P.O.:460] Out: -  Lab Results  Component Value Date   CREATININE 3.46 (H) 08/04/2023   CREATININE 3.59 (H) 08/03/2023   CREATININE 3.55 (H) 08/02/2023     Objective:  Vital signs in last 24 hours:  Temp:  [97.7 F (36.5 C)-99.3 F (37.4 C)] 99.3 F (37.4 C) (03/25 1220) Pulse Rate:  [84-107] 97 (03/25 1220) Resp:  [12-20] 17 (03/25 1220) BP: (114-147)/(53-72) 114/53 (03/25 1220) SpO2:  [92 %-100 %] 92 % (03/25 1220) Weight:  [89.9 kg-91.3 kg] 89.9 kg (03/25 1220)  Weight change: 1.5 kg Filed Weights   08/03/23 0425 08/04/23 0451 08/04/23 1220  Weight: 89.8 kg 91.3 kg 89.9 kg    Intake/Output: I/O last 3 completed shifts: In: 1455.5 [P.O.:460; NG/GT:995.5] Out: -    Intake/Output this shift:  Total I/O In: -  Out: 100 [Urine:100]  Physical Exam: General: No acute distress  Head: Normocephalic, atraumatic. Moist oral mucosal membranes  Lungs:  Clear to auscultation, normal effort  Heart: S1S2 no rubs  Abdomen:  Soft, nontender, bowel sounds present  Extremities: No peripheral edema.  Neurologic: Awake, alert, following commands  Skin: No acute rash  Access: Right femoral dialysis catheter    Basic Metabolic Panel: Recent Labs  Lab 07/30/23 0429 07/31/23 0457 08/01/23 0519 08/02/23 0609 08/03/23 0519 08/04/23 0508  NA 138 138 133* 133* 133* 133*  K 3.9 3.3* 3.8 4.2 4.1 4.3  CL 101 100 100 99 97* 98  CO2 29 29 23 23 24 24   GLUCOSE 123* 134* 189* 321* 201* 125*  BUN 13 20 27* 35* 43* 50*  CREATININE 1.56* 1.95* 2.95* 3.55* 3.59* 3.46*  CALCIUM 8.5* 7.7* 7.7* 7.6* 7.5* 7.6*  MG 2.2 2.2  --  2.0 1.8 1.8  PHOS 2.0* 3.3  --  4.1 3.6 4.0    Liver Function Tests: Recent Labs   Lab 07/29/23 0029 07/29/23 0423 07/29/23 1621 07/30/23 0429  AST  --  21  --   --   ALT  --  15  --   --   ALKPHOS  --  24*  --   --   BILITOT  --  0.9  --   --   PROT  --  5.8*  --   --   ALBUMIN 3.0* 3.1*  3.2* 2.8* 2.8*   Recent Labs  Lab 07/29/23 0423  LIPASE 617*   No results for input(s): "AMMONIA" in the last 168 hours.  CBC: Recent Labs  Lab 07/29/23 0423 07/30/23 0429 07/31/23 0457 08/01/23 0519  WBC 6.7 7.8 8.7 9.2  HGB 10.3* 9.7* 9.6* 10.2*  HCT 29.2* 27.9* 27.7* 29.5*  MCV 88.5 90.9 89.4 89.7  PLT 273 168 156 172    Cardiac Enzymes: No results for input(s): "CKTOTAL", "CKMB", "CKMBINDEX", "TROPONINI" in the last 168 hours.  BNP: Invalid input(s): "POCBNP"  CBG: Recent Labs  Lab 08/03/23 2103 08/04/23 0025 08/04/23 0414 08/04/23 0733 08/04/23 1203  GLUCAP 208* 178* 112* 115* 181*    Microbiology: Results for orders placed or performed during the hospital encounter of 07/28/23  MRSA Next Gen by PCR, Nasal     Status: None   Collection Time: 07/28/23  2:30 PM   Specimen: Nasal Mucosa;  Nasal Swab  Result Value Ref Range Status   MRSA by PCR Next Gen NOT DETECTED NOT DETECTED Final    Comment: (NOTE) The GeneXpert MRSA Assay (FDA approved for NASAL specimens only), is one component of a comprehensive MRSA colonization surveillance program. It is not intended to diagnose MRSA infection nor to guide or monitor treatment for MRSA infections. Test performance is not FDA approved in patients less than 51 years old. Performed at [facility], [address] [place], [place] [address]   Culture, blood (Routine X 2) w Reflex to ID Panel     Status: None   Collection Time: 07/28/23  3:32 PM   Specimen: BLOOD  Result Value Ref Range Status   Specimen Description BLOOD BLOOD LEFT WRIST  Final   Special Requests   Final    BOTTLES DRAWN AEROBIC ONLY Blood Culture results may not be optimal due to an inadequate volume of blood received  in culture bottles   Culture   Final    NO GROWTH 5 DAYS Performed at [facility], [address] [place], [place] [address]    Report Status 08/02/2023 FINAL  Final  Culture, blood (Routine X 2) w Reflex to ID Panel     Status: None   Collection Time: 07/28/23  3:42 PM   Specimen: BLOOD  Result Value Ref Range Status   Specimen Description BLOOD BLOOD RIGHT HAND  Final   Special Requests   Final    BOTTLES DRAWN AEROBIC AND ANAEROBIC Blood Culture results may not be optimal due to an inadequate volume of blood received in culture bottles   Culture   Final    NO GROWTH 5 DAYS Performed at [facility], [address] [place], [place] [address]    Report Status 08/02/2023 FINAL  Final  Gastrointestinal Panel by PCR , Stool     Status: None   Collection Time: 07/28/23 10:29 PM   Specimen: Stool  Result Value Ref Range Status   Campylobacter species NOT DETECTED NOT DETECTED Corrected    Comment: CORRECTED ON 03/19 AT 0238: PREVIOUSLY REPORTED AS NONE DETECTED   Plesimonas shigelloides NOT DETECTED NOT DETECTED Corrected    Comment: CORRECTED ON 03/19 AT 0238: PREVIOUSLY REPORTED AS NONE DETECTED   Salmonella species NOT DETECTED NOT DETECTED Corrected    Comment: CORRECTED ON 03/19 AT 0238: PREVIOUSLY REPORTED AS NONE DETECTED   Yersinia enterocolitica NOT DETECTED NOT DETECTED Corrected    Comment: CORRECTED ON 03/19 AT 0238: PREVIOUSLY REPORTED AS NONE DETECTED   Vibrio species NOT DETECTED NOT DETECTED Corrected    Comment: CORRECTED ON 03/19 AT 0238: PREVIOUSLY REPORTED AS NONE DETECTED   Vibrio cholerae NOT DETECTED NOT DETECTED Corrected    Comment: CORRECTED ON 03/19 AT 0238: PREVIOUSLY REPORTED AS NONE DETECTED   Enteroaggregative E coli (EAEC) NOT DETECTED NOT DETECTED Corrected    Comment: CORRECTED ON 03/19 AT 0238: PREVIOUSLY REPORTED AS NONE DETECTED   Enteropathogenic E coli (EPEC) NOT DETECTED NOT DETECTED Corrected    Comment: CORRECTED ON  03/19 AT 0238: PREVIOUSLY REPORTED AS NONE DETECTED   Enterotoxigenic E coli (ETEC) NOT DETECTED NOT DETECTED Corrected    Comment: CORRECTED ON 03/19 AT 0238: PREVIOUSLY REPORTED AS NONE DETECTED   Shiga like toxin producing E coli (STEC) NOT DETECTED NOT DETECTED Corrected    Comment: CORRECTED ON 03/19 AT 0238: PREVIOUSLY REPORTED AS NONE DETECTED   E. coli O157 NOT DETECTED NOT DETECTED Corrected    Comment: CORRECTED ON  03/19 AT 0238: PREVIOUSLY REPORTED AS NONE DETECTED   Shigella/Enteroinvasive E coli (EIEC) NOT DETECTED NOT DETECTED Corrected    Comment: CORRECTED ON 03/19 AT 0238: PREVIOUSLY REPORTED AS NONE DETECTED   Cryptosporidium NOT DETECTED NOT DETECTED Corrected    Comment: CORRECTED ON 03/19 AT 0238: PREVIOUSLY REPORTED AS NONE DETECTED   Cyclospora cayetanensis NOT DETECTED NOT DETECTED Corrected    Comment: CORRECTED ON 03/19 AT 0238: PREVIOUSLY REPORTED AS NONE DETECTED   Entamoeba histolytica NOT DETECTED NOT DETECTED Corrected    Comment: CORRECTED ON 03/19 AT 0238: PREVIOUSLY REPORTED AS NONE DETECTED   Giardia lamblia NOT DETECTED NOT DETECTED Corrected    Comment: CORRECTED ON 03/19 AT 0238: PREVIOUSLY REPORTED AS NONE DETECTED   Adenovirus F40/41 NOT DETECTED NOT DETECTED Corrected    Comment: CORRECTED ON 03/19 AT 0238: PREVIOUSLY REPORTED AS NONE DETECTED   Astrovirus NOT DETECTED NOT DETECTED Corrected    Comment: CORRECTED ON 03/19 AT 0238: PREVIOUSLY REPORTED AS NONE DETECTED   Norovirus GI/GII NOT DETECTED NOT DETECTED Corrected    Comment: CORRECTED ON 03/19 AT 0238: PREVIOUSLY REPORTED AS NONE DETECTED   Rotavirus A NOT DETECTED NOT DETECTED Corrected    Comment: CORRECTED ON 03/19 AT 0238: PREVIOUSLY REPORTED AS NONE DETECTED   Sapovirus (I, II, IV, and V) NOT DETECTED NOT DETECTED Corrected    Comment: Performed at [facility], [address] [place] [place], [place] [address] CORRECTED ON 03/19 AT 4403: PREVIOUSLY REPORTED AS NONE DETECTED   C  Difficile Quick Screen w PCR reflex     Status: None   Collection Time: 07/28/23 10:29 PM   Specimen: STOOL  Result Value Ref Range Status   C Diff antigen NEGATIVE NEGATIVE Final   C Diff toxin NEGATIVE NEGATIVE Final   C Diff interpretation No C. difficile detected.  Final    Comment: Performed at [facility], [address] [place], [place] [address]  Resp panel by RT-PCR (RSV, Flu A&B, Covid) Anterior Nasal Swab     Status: Abnormal   Collection Time: 08/04/23 10:14 AM   Specimen: Anterior Nasal Swab  Result Value Ref Range Status   SARS Coronavirus 2 by RT PCR NEGATIVE NEGATIVE Final    Comment: (NOTE) SARS-CoV-2 target nucleic acids are NOT DETECTED.  The SARS-CoV-2 RNA is generally detectable in upper respiratory specimens during the acute phase of infection. The lowest concentration of SARS-CoV-2 viral copies this assay can detect is 138 copies/mL. A negative result does not preclude SARS-Cov-2 infection and should not be used as the sole basis for treatment or other patient management decisions. A negative result may occur with  improper specimen collection/handling, submission of specimen other than nasopharyngeal swab, presence of viral mutation(s) within the areas targeted by this assay, and inadequate number of viral copies(<138 copies/mL). A negative result must be combined with clinical observations, patient history, and epidemiological information. The expected result is Negative.  Fact Sheet for Patients:  BloggerCourse.com  Fact Sheet for Healthcare Providers:  SeriousBroker.it  This test is no t yet approved or cleared by the [place] FDA and  has been authorized for detection and/or diagnosis of SARS-CoV-2 by FDA under an Emergency Use Authorization (EUA). This EUA will remain  in effect (meaning this test can be used) for the duration of the COVID-19 declaration under Section 564(b)(1) of the  Act, 21 U.S.C.section 360bbb-3(b)(1), unless the authorization is terminated  or revoked sooner.       Influenza A by PCR POSITIVE (A) NEGATIVE  Final   Influenza B by PCR NEGATIVE NEGATIVE Final    Comment: (NOTE) The Xpert Xpress SARS-CoV-2/FLU/RSV plus assay is intended as an aid in the diagnosis of influenza from Nasopharyngeal swab specimens and should not be used as a sole basis for treatment. Nasal washings and aspirates are unacceptable for Xpert Xpress SARS-CoV-2/FLU/RSV testing.  Fact Sheet for Patients: BloggerCourse.com  Fact Sheet for Healthcare Providers: SeriousBroker.it  This test is not yet approved or cleared by the [place] FDA and has been authorized for detection and/or diagnosis of SARS-CoV-2 by FDA under an Emergency Use Authorization (EUA). This EUA will remain in effect (meaning this test can be used) for the duration of the COVID-19 declaration under Section 564(b)(1) of the Act, 21 U.S.C. section 360bbb-3(b)(1), unless the authorization is terminated or revoked.     Resp Syncytial Virus by PCR NEGATIVE NEGATIVE Final    Comment: (NOTE) Fact Sheet for Patients: BloggerCourse.com  Fact Sheet for Healthcare Providers: SeriousBroker.it  This test is not yet approved or cleared by the [place] FDA and has been authorized for detection and/or diagnosis of SARS-CoV-2 by FDA under an Emergency Use Authorization (EUA). This EUA will remain in effect (meaning this test can be used) for the duration of the COVID-19 declaration under Section 564(b)(1) of the Act, 21 U.S.C. section 360bbb-3(b)(1), unless the authorization is terminated or revoked.  Performed at [facility], [address] [place], [place] [address]     Coagulation Studies: No results for input(s): "LABPROT", "INR" in the last 72 hours.  Urinalysis: No results for  input(s): "COLORURINE", "LABSPEC", "PHURINE", "GLUCOSEU", "HGBUR", "BILIRUBINUR", "KETONESUR", "PROTEINUR", "UROBILINOGEN", "NITRITE", "LEUKOCYTESUR" in the last 72 hours.  Invalid input(s): "APPERANCEUR"     Imaging: No results found.    Medications:    feeding supplement (VITAL 1.5 CAL) 55 mL/hr at 08/03/23 6295     amLODipine  5 mg Oral QHS   apixaban  5 mg Oral BID   Chlorhexidine Gluconate Cloth  6 each Topical Daily   free water  100 mL Per Tube Q4H   insulin aspart  0-15 Units Subcutaneous Q4H   insulin aspart  3 Units Subcutaneous Q4H   levothyroxine  150 mcg Oral Daily   liothyronine  50 mcg Oral q morning   metoprolol succinate  25 mg Oral Daily   multivitamin  1 tablet Oral QHS   multivitamin with minerals  1 tablet Oral Daily   pantoprazole  40 mg Oral BID   Ensure Max Protein  11 oz Oral BID   sodium chloride flush  10-40 mL Intracatheter Q12H   docusate sodium, ondansetron (ZOFRAN) IV, mouth rinse, oxyCODONE-acetaminophen, polyethylene glycol, sodium chloride flush  Assessment/ Plan:  78 y.o. female : Pt is a 78 y.o. female with a PMHx of hypothyroidism, diabetes mellitus type 2 treated with Ozempic and metformin, hypertension, hyperlipidemia, atrial fibrillation, basal cell carcinoma, cystocele, depression, glaucoma, urinary incontinence who was admitted to [facility] on 07/28/2023 for evaluation of nausea, vomiting, loose stools, and abdominal pain.   1.  Acute kidney injury/chronic kidney disease stage IIIb baseline EGFR 31/diabetes mellitus type 2 with chronic kidney disease/hyperkalemia.  Patient admitted with severe acute kidney injury.  Suspect development of ATN from prolonged nausea and vomiting.  CRRT stopped 07/30/2023.  Update: Creatinine remains elevated with little improvement over the past few days.  Questionable urine output.  Encourage staff to monitor strict urine output.  Patient agreeable to dialysis treatment today, no UF.  Will continue to monitor  renal indices and urine output for recovery.  2.  Acute metabolic acidosis.  Ozempic and metformin likely playing some role here.  Corrected   3.  Pancreatitis.  Initial lipase noted to be 810.  CT scan abdomen pelvis reviewed.  Lipase may have been elevated due to renal failure.  4. Hyponatremia, sodium remains 133. Patient receiving tube feeds with ordered flushes.  IV fluid discontinued yesterday.   LOS: 7 Rafeef Lau 3/25/202512:35 PM

## 2023-08-04 NOTE — Plan of Care (Signed)
  Problem: Clinical Measurements: Goal: Ability to maintain clinical measurements within normal limits will improve Outcome: Progressing Goal: Will remain free from infection Outcome: Progressing Goal: Diagnostic test results will improve Outcome: Progressing Goal: Respiratory complications will improve Outcome: Progressing Goal: Cardiovascular complication will be avoided Outcome: Progressing   Problem: Nutrition: Goal: Adequate nutrition will be maintained Outcome: Progressing   Problem: Coping: Goal: Level of anxiety will decrease Outcome: Progressing   Problem: Elimination: Goal: Will not experience complications related to bowel motility Outcome: Progressing Goal: Will not experience complications related to urinary retention Outcome: Progressing   

## 2023-08-04 NOTE — TOC Progression Note (Signed)
 Transition of Care [facility]) - Progression Note    Patient Details  Name: Katherine [name] MRN: [id number] Date of Birth: November 04, 1945  [facility] CM/SW Contact  Chapman Fitch, RN Phone Number: 08/04/2023, 3:34 PM  Clinical Narrative:      Plan for HD today.  Has not been determined if patient will require outpatient HD Per MD and RN therapy evals not indicated       Expected Discharge Plan and Services                                               Social Determinants of Health (SDOH) Interventions SDOH Screenings   Food Insecurity: No Food Insecurity (07/28/2023)  Housing: Low Risk  (07/28/2023)  Transportation Needs: No Transportation Needs (07/28/2023)  Utilities: Not At Risk (07/28/2023)  Social Connections: Moderately Isolated (07/28/2023)  Tobacco Use: Medium Risk (07/28/2023)    Readmission Risk Interventions     No data to display

## 2023-08-04 NOTE — Progress Notes (Addendum)
 PROGRESS NOTE    Katherine [name]   RUE:[id number] DOB: 11/13/1945  DOA: 07/28/2023 Date of Service: 08/04/23 which is hospital day 7  PCP: Dorothey Baseman, MD    Hospital course / significant events:   HPI: Katherine [name] is a 78 year old female with a past medical history significant for diabetes mellitus on metformin and Ozempic, hypertension, hyperlipidemia, anemia, cancer who presents to [facility] ED 07/28/2023 due to complaints of abdominal pain, nausea, vomiting, diarrhea x1 week, worsening.   03/18: admitted with Acute Pancreatitis vs. Gastroenteritis, along with Acute Kidney Injury, Hyperkalemia, and severe Anion Gap Metabolic Acidosis requiring initiation of CRRT.  03/19: renal indices are improving on HD. Persistent abd pain. Initiated CLD. 03/20: on CRRT, not eating well due to abd pain. Renal function has improved. S/p RD evaluation - recs for post-pyloric dobhoff tube placement if no improvement po intake. Cr to 1.56, UOP adequate, CRRT d/c.  03/21: transfer to hospitalist service. Still not taking any po, feed tube placement w/ postpyloric Dobhoff  03/22: titrating tube feeds. Some po intake liquids but not much. Pain about same.  03/23: po intake improving, Cr trending up 03/24: Cr still trending up again, will add IV fluids, nephrology following.  03/25: CR no significant improvement --> dialysis today. Cough and chills, (+)influenza, starting tamiflu     Consultants:  PCCU initial admission Nephrology  Radiology   Procedures/Surgeries: 07/31/23: feed tube placement w/ postpyloric Dobhoff       ASSESSMENT & PLAN:   AKI on CKD IIIb (baseline EGFR 31) ATN from prolonged nausea and vomiting.   S/p CRRT 07/28/23-07/30/23. Nephrology following Dialysis today  Monitor I&O Monitor BMP Hold nephrotoxic medications   Pancreatitis likely d/t GLP1 Rx Sepsis has been RULED OUT  Poor po intake d/t abd pain S/p feeding tube placement 07/31/23 Tube feeds per  dietary recs, see orders  Advance po intake and taper tube feeds as able   Influenza Tamiflu Supportive care   Hyperkalemia d/t renal failure - resolved Monitor BMP  Hypokalemia Replace as needed Monitor BMP  Acute anion gap metabolic acidosis - resolved Lactic Acidosis - resolved  Ozempic and metformin likely playing some role here.   Monitor BMP    Bradycardia, suspect due to Hyperkalemia - resolved Can d/c telemetry, resume metoprolol   DM2 Hypoglycemia d/t poor po intake / antihyperglycemic medications - resolved now hyperglycemic Hold home Metformin, Pioglitazone, Sitagliptin SSI   Hypothyroidism  Synthroid  A.fib  Metoprolol Hold xarelto d/t renal fxn, can use eliquis  HTN Metoprolol, amlodipine Hold lasix, ACE/ARB d/t AKI  HLD Hold statin for now     Class 1 obesity based on BMI: Body mass index is 30.56 kg/m.  Underweight - under 18  overweight - 25 to 29 obese - 30 or more Class 1 obesity: BMI of 30.0 to 34 Class 2 obesity: BMI of 35.0 to 39 Class 3 obesity: BMI of 40.0 to 49 Super Morbid Obesity: BMI 50-59 Super-super Morbid Obesity: BMI 60+ Significantly low or high BMI is associated with higher medical risk.  Weight management advised as adjunct to other disease management and risk reduction treatments    DVT prophylaxis: eliquis IV fluids: per nephrology Nutrition: tube feeds, po to advance as tolerated Central lines / other devices: none  Code Status: DNR ACP documentation reviewed:  none on file in [facility]  TOC needs: TBD, will have PT/OT assess  Medical barriers to dispo: refeeding risk. Expected medical readiness for discharge once po intake and renal function improve but  unfortunately needing dialysis today and may need this permanently             Subjective / Brief ROS:  Patient reports abd pain about the same today Notes chills/shivering, feels cold. Mild cough  Eating some soft foods, liquids - about same as  yesterday  Denies CP/SOB.  Denies new weakness..   Family Communication: called husband 08/04/23 3:49 PM and left HIPAA compliant voicemail - no urgent updates and call floor w/ any questions     Objective Findings:  Vitals:   08/04/23 1330 08/04/23 1400 08/04/23 1430 08/04/23 1500  BP: (!) 118/56 115/61 (!) 96/58 117/66  Pulse: 95 96 96 95  Resp: 19 (!) 9 (!) 8 16  Temp:      TempSrc:      SpO2: 93% 92% 93% 94%  Weight:      Height:        Intake/Output Summary (Last 24 hours) at 08/04/2023 1549 Last data filed at 08/04/2023 0956 Gross per 24 hour  Intake 120 ml  Output 100 ml  Net 20 ml   Filed Weights   08/03/23 0425 08/04/23 0451 08/04/23 1220  Weight: 89.8 kg 91.3 kg 89.9 kg    Examination:  Physical Exam Constitutional:      General: She is not in acute distress. Cardiovascular:     Rate and Rhythm: Normal rate and regular rhythm.  Pulmonary:     Effort: Pulmonary effort is normal.     Breath sounds: Normal breath sounds.  Abdominal:     General: Abdomen is flat. Bowel sounds are normal.     Palpations: Abdomen is soft.     Tenderness: There is no abdominal tenderness. There is no guarding or rebound.  Musculoskeletal:     Right lower leg: No edema.     Left lower leg: No edema.  Skin:    General: Skin is warm and dry.  Neurological:     General: No focal deficit present.     Mental Status: She is alert and oriented to person, place, and time.  Psychiatric:        Mood and Affect: Mood normal.        Behavior: Behavior normal.          Scheduled Medications:   amLODipine  5 mg Oral QHS   apixaban  5 mg Oral BID   Chlorhexidine Gluconate Cloth  6 each Topical Daily   free water  100 mL Per Tube Q4H   insulin aspart  0-15 Units Subcutaneous Q4H   insulin aspart  3 Units Subcutaneous Q4H   levothyroxine  150 mcg Oral Daily   liothyronine  50 mcg Oral q morning   metoprolol succinate  25 mg Oral Daily   multivitamin  1 tablet Oral QHS    multivitamin with minerals  1 tablet Oral Daily   oseltamivir  30 mg Oral Q48H   pantoprazole  40 mg Oral BID   Ensure Max Protein  11 oz Oral BID   sodium chloride flush  10-40 mL Intracatheter Q12H    Continuous Infusions:  feeding supplement (VITAL 1.5 CAL) 55 mL/hr at 08/03/23 0612     PRN Medications:  docusate sodium, guaiFENesin-dextromethorphan, ondansetron (ZOFRAN) IV, mouth rinse, oxyCODONE-acetaminophen, polyethylene glycol, sodium chloride flush  Antimicrobials from admission:  Anti-infectives (From admission, onward)    Start     Dose/Rate Route Frequency Ordered Stop   08/04/23 1800  oseltamivir (TAMIFLU) capsule 30 mg  30 mg Oral Every 48 hours 08/04/23 1319 08/10/23 1759   07/30/23 2200  piperacillin-tazobactam (ZOSYN) IVPB 3.375 g  Status:  Discontinued        3.375 g 12.5 mL/hr over 240 Minutes Intravenous Every 8 hours 07/30/23 1621 07/31/23 1132   07/29/23 0000  piperacillin-tazobactam (ZOSYN) IVPB 3.375 g  Status:  Discontinued        3.375 g 100 mL/hr over 30 Minutes Intravenous Every 6 hours 07/28/23 1704 07/30/23 1621   07/28/23 1400  piperacillin-tazobactam (ZOSYN) IVPB 2.25 g  Status:  Discontinued        2.25 g 100 mL/hr over 30 Minutes Intravenous Every 8 hours 07/28/23 1336 07/28/23 1704           Data Reviewed:  I have personally reviewed the following...  CBC: Recent Labs  Lab 07/29/23 0423 07/30/23 0429 07/31/23 0457 08/01/23 0519 08/04/23 1319  WBC 6.7 7.8 8.7 9.2 5.2  HGB 10.3* 9.7* 9.6* 10.2* 7.6*  HCT 29.2* 27.9* 27.7* 29.5* 21.5*  MCV 88.5 90.9 89.4 89.7 90.0  PLT 273 168 156 172 167   Basic Metabolic Panel: Recent Labs  Lab 07/30/23 0429 07/31/23 0457 08/01/23 0519 08/02/23 0609 08/03/23 0519 08/04/23 0508  NA 138 138 133* 133* 133* 133*  K 3.9 3.3* 3.8 4.2 4.1 4.3  CL 101 100 100 99 97* 98  CO2 29 29 23 23 24 24   GLUCOSE 123* 134* 189* 321* 201* 125*  BUN 13 20 27* 35* 43* 50*  CREATININE 1.56* 1.95*  2.95* 3.55* 3.59* 3.46*  CALCIUM 8.5* 7.7* 7.7* 7.6* 7.5* 7.6*  MG 2.2 2.2  --  2.0 1.8 1.8  PHOS 2.0* 3.3  --  4.1 3.6 4.0   GFR: Estimated Creatinine Clearance: 16.2 mL/min (A) (by C-G formula based on SCr of 3.46 mg/dL (H)). Liver Function Tests: Recent Labs  Lab 07/29/23 0029 07/29/23 0423 07/29/23 1621 07/30/23 0429  AST  --  21  --   --   ALT  --  15  --   --   ALKPHOS  --  24*  --   --   BILITOT  --  0.9  --   --   PROT  --  5.8*  --   --   ALBUMIN 3.0* 3.1*  3.2* 2.8* 2.8*   Recent Labs  Lab 07/29/23 0423  LIPASE 617*   No results for input(s): "AMMONIA" in the last 168 hours. Coagulation Profile: No results for input(s): "INR", "PROTIME" in the last 168 hours. Cardiac Enzymes: No results for input(s): "CKTOTAL", "CKMB", "CKMBINDEX", "TROPONINI" in the last 168 hours. BNP (last 3 results) No results for input(s): "PROBNP" in the last 8760 hours. HbA1C: No results for input(s): "HGBA1C" in the last 72 hours.  CBG: Recent Labs  Lab 08/03/23 2103 08/04/23 0025 08/04/23 0414 08/04/23 0733 08/04/23 1203  GLUCAP 208* 178* 112* 115* 181*   Lipid Profile: No results for input(s): "CHOL", "HDL", "LDLCALC", "TRIG", "CHOLHDL", "LDLDIRECT" in the last 72 hours.  Thyroid Function Tests: No results for input(s): "TSH", "T4TOTAL", "FREET4", "T3FREE", "THYROIDAB" in the last 72 hours.  Anemia Panel: No results for input(s): "VITAMINB12", "FOLATE", "FERRITIN", "TIBC", "IRON", "RETICCTPCT" in the last 72 hours. Most Recent Urinalysis On File:     Component Value Date/Time   COLORURINE STRAW (A) 07/29/2023 0528   APPEARANCEUR CLEAR (A) 07/29/2023 0528   LABSPEC 1.011 07/29/2023 0528   PHURINE 5.0 07/29/2023 0528   GLUCOSEU 150 (A) 07/29/2023 0528   HGBUR SMALL (A)  07/29/2023 0528   BILIRUBINUR NEGATIVE 07/29/2023 0528   KETONESUR 5 (A) 07/29/2023 0528   PROTEINUR 30 (A) 07/29/2023 0528   NITRITE NEGATIVE 07/29/2023 0528   LEUKOCYTESUR NEGATIVE 07/29/2023 0528    Sepsis Labs: @LABRCNTIP (procalcitonin:4,lacticidven:4) Microbiology: Recent Results (from the past 240 hours)  MRSA Next Gen by PCR, Nasal     Status: None   Collection Time: 07/28/23  2:30 PM   Specimen: Nasal Mucosa; Nasal Swab  Result Value Ref Range Status   MRSA by PCR Next Gen NOT DETECTED NOT DETECTED Final    Comment: (NOTE) The GeneXpert MRSA Assay (FDA approved for NASAL specimens only), is one component of a comprehensive MRSA colonization surveillance program. It is not intended to diagnose MRSA infection nor to guide or monitor treatment for MRSA infections. Test performance is not FDA approved in patients less than 82 years old. Performed at [facility], [address] [place], [place] [address]   Culture, blood (Routine X 2) w Reflex to ID Panel     Status: None   Collection Time: 07/28/23  3:32 PM   Specimen: BLOOD  Result Value Ref Range Status   Specimen Description BLOOD BLOOD LEFT WRIST  Final   Special Requests   Final    BOTTLES DRAWN AEROBIC ONLY Blood Culture results may not be optimal due to an inadequate volume of blood received in culture bottles   Culture   Final    NO GROWTH 5 DAYS Performed at [facility], [address] [place], [place] [address]    Report Status 08/02/2023 FINAL  Final  Culture, blood (Routine X 2) w Reflex to ID Panel     Status: None   Collection Time: 07/28/23  3:42 PM   Specimen: BLOOD  Result Value Ref Range Status   Specimen Description BLOOD BLOOD RIGHT HAND  Final   Special Requests   Final    BOTTLES DRAWN AEROBIC AND ANAEROBIC Blood Culture results may not be optimal due to an inadequate volume of blood received in culture bottles   Culture   Final    NO GROWTH 5 DAYS Performed at [facility], [address] [place], [place] [address]    Report Status 08/02/2023 FINAL  Final  Gastrointestinal Panel by PCR , Stool     Status: None   Collection Time: 07/28/23 10:29 PM    Specimen: Stool  Result Value Ref Range Status   Campylobacter species NOT DETECTED NOT DETECTED Corrected    Comment: CORRECTED ON 03/19 AT 0238: PREVIOUSLY REPORTED AS NONE DETECTED   Plesimonas shigelloides NOT DETECTED NOT DETECTED Corrected    Comment: CORRECTED ON 03/19 AT 0238: PREVIOUSLY REPORTED AS NONE DETECTED   Salmonella species NOT DETECTED NOT DETECTED Corrected    Comment: CORRECTED ON 03/19 AT 0238: PREVIOUSLY REPORTED AS NONE DETECTED   Yersinia enterocolitica NOT DETECTED NOT DETECTED Corrected    Comment: CORRECTED ON 03/19 AT 0238: PREVIOUSLY REPORTED AS NONE DETECTED   Vibrio species NOT DETECTED NOT DETECTED Corrected    Comment: CORRECTED ON 03/19 AT 0238: PREVIOUSLY REPORTED AS NONE DETECTED   Vibrio cholerae NOT DETECTED NOT DETECTED Corrected    Comment: CORRECTED ON 03/19 AT 0238: PREVIOUSLY REPORTED AS NONE DETECTED   Enteroaggregative E coli (EAEC) NOT DETECTED NOT DETECTED Corrected    Comment: CORRECTED ON 03/19 AT 0238: PREVIOUSLY REPORTED AS NONE DETECTED   Enteropathogenic E coli (EPEC) NOT DETECTED NOT DETECTED Corrected    Comment: CORRECTED ON 03/19 AT 0238: PREVIOUSLY  REPORTED AS NONE DETECTED   Enterotoxigenic E coli (ETEC) NOT DETECTED NOT DETECTED Corrected    Comment: CORRECTED ON 03/19 AT 8119: PREVIOUSLY REPORTED AS NONE DETECTED   Shiga like toxin producing E coli (STEC) NOT DETECTED NOT DETECTED Corrected    Comment: CORRECTED ON 03/19 AT 0238: PREVIOUSLY REPORTED AS NONE DETECTED   E. coli O157 NOT DETECTED NOT DETECTED Corrected    Comment: CORRECTED ON 03/19 AT 0238: PREVIOUSLY REPORTED AS NONE DETECTED   Shigella/Enteroinvasive E coli (EIEC) NOT DETECTED NOT DETECTED Corrected    Comment: CORRECTED ON 03/19 AT 0238: PREVIOUSLY REPORTED AS NONE DETECTED   Cryptosporidium NOT DETECTED NOT DETECTED Corrected    Comment: CORRECTED ON 03/19 AT 0238: PREVIOUSLY REPORTED AS NONE DETECTED   Cyclospora cayetanensis NOT DETECTED NOT DETECTED  Corrected    Comment: CORRECTED ON 03/19 AT 0238: PREVIOUSLY REPORTED AS NONE DETECTED   Entamoeba histolytica NOT DETECTED NOT DETECTED Corrected    Comment: CORRECTED ON 03/19 AT 0238: PREVIOUSLY REPORTED AS NONE DETECTED   Giardia lamblia NOT DETECTED NOT DETECTED Corrected    Comment: CORRECTED ON 03/19 AT 0238: PREVIOUSLY REPORTED AS NONE DETECTED   Adenovirus F40/41 NOT DETECTED NOT DETECTED Corrected    Comment: CORRECTED ON 03/19 AT 0238: PREVIOUSLY REPORTED AS NONE DETECTED   Astrovirus NOT DETECTED NOT DETECTED Corrected    Comment: CORRECTED ON 03/19 AT 0238: PREVIOUSLY REPORTED AS NONE DETECTED   Norovirus GI/GII NOT DETECTED NOT DETECTED Corrected    Comment: CORRECTED ON 03/19 AT 0238: PREVIOUSLY REPORTED AS NONE DETECTED   Rotavirus A NOT DETECTED NOT DETECTED Corrected    Comment: CORRECTED ON 03/19 AT 0238: PREVIOUSLY REPORTED AS NONE DETECTED   Sapovirus (I, II, IV, and V) NOT DETECTED NOT DETECTED Corrected    Comment: Performed at [facility], [address] [place], [place] [address] CORRECTED ON 03/19 AT 9562: PREVIOUSLY REPORTED AS NONE DETECTED   C Difficile Quick Screen w PCR reflex     Status: None   Collection Time: 07/28/23 10:29 PM   Specimen: STOOL  Result Value Ref Range Status   C Diff antigen NEGATIVE NEGATIVE Final   C Diff toxin NEGATIVE NEGATIVE Final   C Diff interpretation No C. difficile detected.  Final    Comment: Performed at [facility], [address] [place], [place] [address]  Resp panel by RT-PCR (RSV, Flu A&B, Covid) Anterior Nasal Swab     Status: Abnormal   Collection Time: 08/04/23 10:14 AM   Specimen: Anterior Nasal Swab  Result Value Ref Range Status   SARS Coronavirus 2 by RT PCR NEGATIVE NEGATIVE Final    Comment: (NOTE) SARS-CoV-2 target nucleic acids are NOT DETECTED.  The SARS-CoV-2 RNA is generally detectable in upper respiratory specimens during the acute phase of infection. The  lowest concentration of SARS-CoV-2 viral copies this assay can detect is 138 copies/mL. A negative result does not preclude SARS-Cov-2 infection and should not be used as the sole basis for treatment or other patient management decisions. A negative result may occur with  improper specimen collection/handling, submission of specimen other than nasopharyngeal swab, presence of viral mutation(s) within the areas targeted by this assay, and inadequate number of viral copies(<138 copies/mL). A negative result must be combined with clinical observations, patient history, and epidemiological information. The expected result is Negative.  Fact Sheet for Patients:  BloggerCourse.com  Fact Sheet for Healthcare Providers:  SeriousBroker.it  This test is no t yet approved or cleared by the  [place] [employer] and  has been authorized for detection and/or diagnosis of SARS-CoV-2 by FDA under an TEFL teacher (EUA). This EUA will remain  in effect (meaning this test can be used) for the duration of the COVID-19 declaration under Section 564(b)(1) of the Act, 21 U.S.C.section 360bbb-3(b)(1), unless the authorization is terminated  or revoked sooner.       Influenza A by PCR POSITIVE (A) NEGATIVE Final   Influenza B by PCR NEGATIVE NEGATIVE Final    Comment: (NOTE) The Xpert Xpress SARS-CoV-2/FLU/RSV plus assay is intended as an aid in the diagnosis of influenza from Nasopharyngeal swab specimens and should not be used as a sole basis for treatment. Nasal washings and aspirates are unacceptable for Xpert Xpress SARS-CoV-2/FLU/RSV testing.  Fact Sheet for Patients: BloggerCourse.com  Fact Sheet for Healthcare Providers: SeriousBroker.it  This test is not yet approved or cleared by the [place] FDA and has been authorized for detection and/or diagnosis of SARS-CoV-2 by FDA  under an Emergency Use Authorization (EUA). This EUA will remain in effect (meaning this test can be used) for the duration of the COVID-19 declaration under Section 564(b)(1) of the Act, 21 U.S.C. section 360bbb-3(b)(1), unless the authorization is terminated or revoked.     Resp Syncytial Virus by PCR NEGATIVE NEGATIVE Final    Comment: (NOTE) Fact Sheet for Patients: BloggerCourse.com  Fact Sheet for Healthcare Providers: SeriousBroker.it  This test is not yet approved or cleared by the [place] FDA and has been authorized for detection and/or diagnosis of SARS-CoV-2 by FDA under an Emergency Use Authorization (EUA). This EUA will remain in effect (meaning this test can be used) for the duration of the COVID-19 declaration under Section 564(b)(1) of the Act, 21 U.S.C. section 360bbb-3(b)(1), unless the authorization is terminated or revoked.  Performed at [facility], [address], [place], [place] [address]       Radiology Studies last 3 days: [facility] Chest [facility] 1 View Result Date: 08/04/2023 CLINICAL DATA:  Cough. EXAM: PORTABLE CHEST 1 VIEW COMPARISON:  Chest radiograph dated 02/24/2019 and CT dated 04/30/2021. FINDINGS: Enteric tube extends below the diaphragm with tip beyond the inferior margin of the image. An area of increased density in the right mid lung field, likely related to skin fold or atelectasis. Developing infiltrate is less likely. No pleural effusion pneumothorax. The cardiac silhouette is within normal limits. Atherosclerotic calcification of the aorta. No acute osseous pathology. IMPRESSION: 1. Enteric tube extends below the diaphragm with tip beyond the inferior margin of the image. 2. Probable skin fold or atelectasis in the right mid lung field. Electronically Signed   By: Elgie Collard M.D.   On: 08/04/2023 14:57   ECHOCARDIOGRAM COMPLETE Result Date: 08/01/2023    ECHOCARDIOGRAM REPORT    Patient Name:   Katherine [name] Date of Exam: 08/01/2023 Medical Rec #:  [id number]         Height:       67.0 in Accession #:    [id number]        Weight:       194.7 lb Date of Birth:  1945-07-01         BSA:          1.999 m Patient Age:    77 years          BP:           151/79 mmHg Patient Gender: F  HR:           82 bpm. Exam Location:  [facility] Procedure: 2D Echo, Cardiac Doppler and Color Doppler (Both Spectral and Color            Flow Doppler were utilized during procedure). Indications:     Abnormal ECG R94.31  History:         Patient has no prior history of Echocardiogram examinations.  Sonographer:     Elwin Sleight RDCS Referring Phys:  [id number] Ezequiel Essex Diagnosing Phys: Adrian Blackwater  Sonographer Comments: Image acquisition challenging due to respiratory motion. IMPRESSIONS  1. Left ventricular ejection fraction, by estimation, is 60 to 65%. The left ventricle has normal function. The left ventricle has no regional wall motion abnormalities. There is mild concentric left ventricular hypertrophy. Left ventricular diastolic parameters were normal.  2. Right ventricular systolic function is normal. The right ventricular size is normal.  3. Left atrial size was mild to moderately dilated.  4. Right atrial size was mildly dilated.  5. The mitral valve is normal in structure. Mild to moderate mitral valve regurgitation. No evidence of mitral stenosis.  6. The aortic valve is calcified. Aortic valve regurgitation is not visualized. Aortic valve sclerosis/calcification is present, without any evidence of aortic stenosis.  7. The inferior vena cava is normal in size with greater than 50% respiratory variability, suggesting right atrial pressure of 3 mmHg. FINDINGS  Left Ventricle: Left ventricular ejection fraction, by estimation, is 60 to 65%. The left ventricle has normal function. The left ventricle has no regional wall motion abnormalities. Strain was performed and the global longitudinal  strain is indeterminate. The left ventricular internal cavity size was normal in size. There is mild concentric left ventricular hypertrophy. Left ventricular diastolic parameters were normal. Right Ventricle: The right ventricular size is normal. No increase in right ventricular wall thickness. Right ventricular systolic function is normal. Left Atrium: Left atrial size was mild to moderately dilated. Right Atrium: Right atrial size was mildly dilated. Pericardium: There is no evidence of pericardial effusion. Mitral Valve: The mitral valve is normal in structure. Mild to moderate mitral valve regurgitation. No evidence of mitral valve stenosis. Tricuspid Valve: The tricuspid valve is normal in structure. Tricuspid valve regurgitation is mild . No evidence of tricuspid stenosis. Aortic Valve: The aortic valve is calcified. Aortic valve regurgitation is not visualized. Aortic valve sclerosis/calcification is present, without any evidence of aortic stenosis. Aortic valve peak gradient measures 7.0 mmHg. Pulmonic Valve: The pulmonic valve was normal in structure. Pulmonic valve regurgitation is not visualized. No evidence of pulmonic stenosis. Aorta: The aortic root is normal in size and structure. Venous: The inferior vena cava is normal in size with greater than 50% respiratory variability, suggesting right atrial pressure of 3 mmHg. IAS/Shunts: No atrial level shunt detected by color flow Doppler. Additional Comments: 3D was performed not requiring image post processing on an independent workstation and was indeterminate.  LEFT VENTRICLE PLAX 2D LVIDd:         4.00 cm   Diastology LVIDs:         2.90 cm   LV e' medial:  9.46 cm/s LV PW:         1.10 cm   LV e' lateral: 13.70 cm/s LV IVS:        1.10 cm LVOT diam:     2.00 cm LV SV:         62 LV SV Index:   31 LVOT Area:  3.14 cm  RIGHT VENTRICLE RV Basal diam:  3.40 cm RV S prime:     13.60 cm/s TAPSE (M-mode): 1.9 cm LEFT ATRIUM             Index        RIGHT  ATRIUM           Index LA diam:        4.70 cm 2.35 cm/m   RA Area:     16.70 cm LA Vol (A2C):   59.0 ml 29.51 ml/m  RA Volume:   36.70 ml  18.36 ml/m LA Vol (A4C):   45.3 ml 22.66 ml/m LA Biplane Vol: 53.4 ml 26.71 ml/m  AORTIC VALVE                 PULMONIC VALVE AV Area (Vmax): 2.56 cm     PV Vmax:        1.06 m/s AV Vmax:        132.50 cm/s  PV Peak grad:   4.5 mmHg AV Peak Grad:   7.0 mmHg     RVOT Peak grad: 2 mmHg LVOT Vmax:      108.00 cm/s LVOT Vmean:     71.600 cm/s LVOT VTI:       0.196 m  AORTA Ao Root diam: 3.00 cm Ao Asc diam:  3.25 cm  SHUNTS Systemic VTI:  0.20 m Systemic Diam: 2.00 cm Adrian Blackwater Electronically signed by Adrian Blackwater Signature Date/Time: 08/01/2023/4:20:41 PM    Final          Sunnie Nielsen, DO [facility] 08/04/2023, 3:49 PM    Dictation software may have been used to generate the above note. Typos may occur and escape review in typed/dictated notes. Please contact Dr Lyn Hollingshead directly for clarity if needed.  Staff may message me via secure chat in Epic  but this may not receive an immediate response,  please page me for urgent matters!  If 7PM-7AM, please contact night coverage [email or web address]

## 2023-08-05 DIAGNOSIS — N170 Acute kidney failure with tubular necrosis: Secondary | ICD-10-CM | POA: Diagnosis not present

## 2023-08-05 DIAGNOSIS — K8500 Idiopathic acute pancreatitis without necrosis or infection: Secondary | ICD-10-CM

## 2023-08-05 DIAGNOSIS — J09X1 Influenza due to identified novel influenza A virus with pneumonia: Secondary | ICD-10-CM

## 2023-08-05 LAB — GLUCOSE, CAPILLARY
Glucose-Capillary: 133 mg/dL — ABNORMAL HIGH (ref 70–99)
Glucose-Capillary: 139 mg/dL — ABNORMAL HIGH (ref 70–99)
Glucose-Capillary: 145 mg/dL — ABNORMAL HIGH (ref 70–99)
Glucose-Capillary: 169 mg/dL — ABNORMAL HIGH (ref 70–99)
Glucose-Capillary: 175 mg/dL — ABNORMAL HIGH (ref 70–99)
Glucose-Capillary: 179 mg/dL — ABNORMAL HIGH (ref 70–99)
Glucose-Capillary: 248 mg/dL — ABNORMAL HIGH (ref 70–99)

## 2023-08-05 LAB — BASIC METABOLIC PANEL
Anion gap: 6 (ref 5–15)
BUN: 31 mg/dL — ABNORMAL HIGH (ref 8–23)
CO2: 28 mmol/L (ref 22–32)
Calcium: 7.6 mg/dL — ABNORMAL LOW (ref 8.9–10.3)
Chloride: 96 mmol/L — ABNORMAL LOW (ref 98–111)
Creatinine, Ser: 2.24 mg/dL — ABNORMAL HIGH (ref 0.44–1.00)
GFR, Estimated: 22 mL/min — ABNORMAL LOW (ref >60)
Glucose, Bld: 270 mg/dL — ABNORMAL HIGH (ref 70–99)
Potassium: 4.4 mmol/L (ref 3.5–5.1)
Sodium: 130 mmol/L — ABNORMAL LOW (ref 135–145)

## 2023-08-05 LAB — HEPATITIS B SURFACE ANTIBODY, QUANTITATIVE: Hep B S AB Quant (Post): 2390 m[IU]/mL

## 2023-08-05 LAB — PHOSPHORUS: Phosphorus: 2.8 mg/dL (ref 2.5–4.6)

## 2023-08-05 LAB — MAGNESIUM: Magnesium: 1.7 mg/dL (ref 1.7–2.4)

## 2023-08-05 MED ORDER — LACTULOSE 10 GM/15ML PO SOLN: 10.0000 g | Freq: Every day | ORAL | Status: DC | PRN

## 2023-08-05 MED ORDER — OSELTAMIVIR PHOSPHATE 30 MG PO CAPS
30.0000 mg | ORAL_CAPSULE | ORAL | Status: DC
  Administered 2023-08-05 – 2023-08-07 (×3): 30 mg via ORAL
  Filled 2023-08-05 (×4): qty 1

## 2023-08-05 MED ORDER — FREE WATER
30.0000 mL | Status: DC
  Administered 2023-08-05 – 2023-08-07 (×9): 30 mL

## 2023-08-05 MED ORDER — VITAL 1.5 CAL PO LIQD
1000.0000 mL | ORAL | Status: DC
Start: 2023-08-05 — End: 2023-08-07
  Administered 2023-08-05 – 2023-08-06 (×2): 1000 mL

## 2023-08-05 MED ORDER — POLYETHYLENE GLYCOL 3350 17 G PO PACK
17.0000 g | PACK | Freq: Two times a day (BID) | ORAL | Status: DC
  Administered 2023-08-05 – 2023-08-06 (×4): 17 g via ORAL
  Filled 2023-08-05 (×5): qty 1

## 2023-08-05 NOTE — Progress Notes (Signed)
 Nutrition Follow Up Note   DOCUMENTATION CODES:   Not applicable  INTERVENTION:   Change to nocturnal tube feeds:  Vital 1.5@70ml /hr x 14 hrs overnight (from 1800-0800)  Free water flushes 30ml q4 hours to maintain tube patency   Regimen provides 1470kcal/day, 66g/day of protein and 930ml/day of free water.   Ensure Max protein supplement po BID, each supplement provides 150kcal and 30g of protein.  Rena-vit po daily   NUTRITION DIAGNOSIS:   Inadequate oral intake related to acute illness as evidenced by per patient/family report. -improving   GOAL:   Patient will meet greater than or equal to 90% of their needs -met   MONITOR:   PO intake, Supplement acceptance, Labs, Weight trends, Skin, I & O's, tube feed tolerance   ASSESSMENT:   78 y/o female with h/o DM, IDA, PUD, HTN, depression, hypothyroidism and HLD who is admitted with acute pancreatitis, AKI and possible enteritis.  Met with pt and family in room today. Pt sitting up on the edge of the bed and in pretty good spirits. Pt reports that she is feeling better but continues to have significant abdominal pain. Pt is tolerating post pyloric tube feeds without issue but feels as though the tube feeds may be causing her early satiety and making her too full too eat. Pt documented to only be eating sips/bites of meals. Pt has not been drinking the Ensure Max. Discussed with patient, will change tube feeds over to nocturnal feeds to meet 75% of her estimated needs. Recommended for tube feeds to continue until patient is able to eat a decent amount of a regular diet without any abdominal pain; pt is in agreement with this plan. Encouraged for patient to start drinking the Ensure to help her meet her estimated needs once feeds discontinued. Pt noted to have a BM today.   Per chart, pt is up ~23lbs since admission and appears to be up ~5lbs from her UBW of 193lbs. Last HD 3/25.   Medications reviewed and include: insulin,  synthroid, rena-vit, protonix, miralax  Labs reviewed: Na 133(L), K 4.4 wnl, BUN 31(H), creat 2.24(H), P 2.8 wnl, Mg 1.7wnl Hgb 7.6(L), Hct 21.5(L) Cbgs- 169, 175, 248, 179 x 24 hrs   UOP-   NUTRITION - FOCUSED PHYSICAL EXAM:  Flowsheet Row Most Recent Value  Orbital Region No depletion  Upper Arm Region Mild depletion  Thoracic and Lumbar Region No depletion  Buccal Region No depletion  Temple Region Mild depletion  Clavicle Bone Region Moderate depletion  Clavicle and Acromion Bone Region Moderate depletion  Scapular Bone Region No depletion  Dorsal Hand No depletion  Patellar Region No depletion  Anterior Thigh Region No depletion  Posterior Calf Region No depletion  Edema (RD Assessment) Mild  Hair Reviewed  Eyes Reviewed  Mouth Reviewed  Skin Reviewed  Nails Reviewed   Diet Order:   Diet Order             Diet regular Room service appropriate? Yes; Fluid consistency: Thin  Diet effective now                  EDUCATION NEEDS:   Education needs have been addressed  Skin:  Skin Assessment: Reviewed RN Assessment  Last BM:  3/26  Height:   Ht Readings from Last 1 Encounters:  07/28/23 5\' 7"  (1.702 m)    Weight:   Wt Readings from Last 1 Encounters:  08/04/23 89.9 kg    Ideal Body Weight:  61.36 kg  BMI:  Body mass index is 31.04 kg/m.  Estimated Nutritional Needs:   Kcal:  1700-1900kcal/day  Protein:  85-95g/day  Fluid:  1.7-1.9L/day  Betsey Holiday MS, RD, LDN If unable to be reached, please send secure chat to "RD inpatient" available from 8:00a-4:00p daily

## 2023-08-05 NOTE — Progress Notes (Signed)
 [facility]  ROUNDING NOTE   Subjective:   Patient seen ambulating in room Continues to tolerate small meals States due to tube feeds, she feels full  Tube feeds in place, Vital @ 18ml/hr   03/25 0701 - 03/26 0700 In: 320 [P.O.:320] Out: 450 [Urine:450] Lab Results  Component Value Date   CREATININE 2.24 (H) 08/05/2023   CREATININE 3.46 (H) 08/04/2023   CREATININE 3.59 (H) 08/03/2023     Objective:  Vital signs in last 24 hours:  Temp:  [97.8 F (36.6 C)-99.3 F (37.4 C)] 97.8 F (36.6 C) (03/26 0722) Pulse Rate:  [87-104] 87 (03/26 0722) Resp:  [8-19] 18 (03/26 0722) BP: (96-128)/(52-66) 120/60 (03/26 0722) SpO2:  [92 %-98 %] 97 % (03/26 0722) Weight:  [89.9 kg] 89.9 kg (03/25 1553)  Weight change: -1.4 kg Filed Weights   08/04/23 0451 08/04/23 1220 08/04/23 1553  Weight: 91.3 kg 89.9 kg 89.9 kg    Intake/Output: I/O last 3 completed shifts: In: 320 [P.O.:320] Out: 450 [Urine:450]   Intake/Output this shift:  Total I/O In: 3801.2 [P.O.:240; NG/GT:3561.2] Out: 850 [Urine:850]  Physical Exam: General: No acute distress  Head: Normocephalic, atraumatic. Moist oral mucosal membranes  Lungs:  Clear to auscultation, normal effort  Heart: S1S2 no rubs  Abdomen:  Soft, nontender, bowel sounds present  Extremities: No peripheral edema.  Neurologic: Awake, alert, following commands  Skin: No acute rash  Access: Right femoral dialysis catheter    Basic Metabolic Panel: Recent Labs  Lab 07/31/23 0457 08/01/23 0519 08/02/23 0609 08/03/23 0519 08/04/23 0508 08/05/23 0502  NA 138 133* 133* 133* 133* 130*  K 3.3* 3.8 4.2 4.1 4.3 4.4  CL 100 100 99 97* 98 96*  CO2 29 23 23 24 24 28   GLUCOSE 134* 189* 321* 201* 125* 270*  BUN 20 27* 35* 43* 50* 31*  CREATININE 1.95* 2.95* 3.55* 3.59* 3.46* 2.24*  CALCIUM 7.7* 7.7* 7.6* 7.5* 7.6* 7.6*  MG 2.2  --  2.0 1.8 1.8 1.7  PHOS 3.3  --  4.1 3.6 4.0 2.8    Liver Function Tests: Recent Labs  Lab  07/29/23 1621 07/30/23 0429  ALBUMIN 2.8* 2.8*   No results for input(s): "LIPASE", "AMYLASE" in the last 168 hours.  No results for input(s): "AMMONIA" in the last 168 hours.  CBC: Recent Labs  Lab 07/30/23 0429 07/31/23 0457 08/01/23 0519 08/04/23 1319  WBC 7.8 8.7 9.2 5.2  HGB 9.7* 9.6* 10.2* 7.6*  HCT 27.9* 27.7* 29.5* 21.5*  MCV 90.9 89.4 89.7 90.0  PLT 168 156 172 167    Cardiac Enzymes: No results for input(s): "CKTOTAL", "CKMB", "CKMBINDEX", "TROPONINI" in the last 168 hours.  BNP: Invalid input(s): "POCBNP"  CBG: Recent Labs  Lab 08/04/23 1934 08/05/23 0036 08/05/23 0414 08/05/23 0721 08/05/23 1129  GLUCAP 212* 179* 248* 175* 169*    Microbiology: Results for orders placed or performed during the hospital encounter of 07/28/23  MRSA Next Gen by PCR, Nasal     Status: None   Collection Time: 07/28/23  2:30 PM   Specimen: Nasal Mucosa; Nasal Swab  Result Value Ref Range Status   MRSA by PCR Next Gen NOT DETECTED NOT DETECTED Final    Comment: (NOTE) The GeneXpert MRSA Assay (FDA approved for NASAL specimens only), is one component of a comprehensive MRSA colonization surveillance program. It is not intended to diagnose MRSA infection nor to guide or monitor treatment for MRSA infections. Test performance is not FDA approved in patients less  than 61 years old. Performed at [facility], [address] [place], [place] [address]   Culture, blood (Routine X 2) w Reflex to ID Panel     Status: None   Collection Time: 07/28/23  3:32 PM   Specimen: BLOOD  Result Value Ref Range Status   Specimen Description BLOOD BLOOD LEFT WRIST  Final   Special Requests   Final    BOTTLES DRAWN AEROBIC ONLY Blood Culture results may not be optimal due to an inadequate volume of blood received in culture bottles   Culture   Final    NO GROWTH 5 DAYS Performed at [facility], [address] [place], [place] [address]    Report Status  08/02/2023 FINAL  Final  Culture, blood (Routine X 2) w Reflex to ID Panel     Status: None   Collection Time: 07/28/23  3:42 PM   Specimen: BLOOD  Result Value Ref Range Status   Specimen Description BLOOD BLOOD RIGHT HAND  Final   Special Requests   Final    BOTTLES DRAWN AEROBIC AND ANAEROBIC Blood Culture results may not be optimal due to an inadequate volume of blood received in culture bottles   Culture   Final    NO GROWTH 5 DAYS Performed at [facility], [address] [place], [place] [address]    Report Status 08/02/2023 FINAL  Final  Gastrointestinal Panel by PCR , Stool     Status: None   Collection Time: 07/28/23 10:29 PM   Specimen: Stool  Result Value Ref Range Status   Campylobacter species NOT DETECTED NOT DETECTED Corrected    Comment: CORRECTED ON 03/19 AT 0238: PREVIOUSLY REPORTED AS NONE DETECTED   Plesimonas shigelloides NOT DETECTED NOT DETECTED Corrected    Comment: CORRECTED ON 03/19 AT 0238: PREVIOUSLY REPORTED AS NONE DETECTED   Salmonella species NOT DETECTED NOT DETECTED Corrected    Comment: CORRECTED ON 03/19 AT 0238: PREVIOUSLY REPORTED AS NONE DETECTED   Yersinia enterocolitica NOT DETECTED NOT DETECTED Corrected    Comment: CORRECTED ON 03/19 AT 0238: PREVIOUSLY REPORTED AS NONE DETECTED   Vibrio species NOT DETECTED NOT DETECTED Corrected    Comment: CORRECTED ON 03/19 AT 0238: PREVIOUSLY REPORTED AS NONE DETECTED   Vibrio cholerae NOT DETECTED NOT DETECTED Corrected    Comment: CORRECTED ON 03/19 AT 0238: PREVIOUSLY REPORTED AS NONE DETECTED   Enteroaggregative E coli (EAEC) NOT DETECTED NOT DETECTED Corrected    Comment: CORRECTED ON 03/19 AT 0238: PREVIOUSLY REPORTED AS NONE DETECTED   Enteropathogenic E coli (EPEC) NOT DETECTED NOT DETECTED Corrected    Comment: CORRECTED ON 03/19 AT 0238: PREVIOUSLY REPORTED AS NONE DETECTED   Enterotoxigenic E coli (ETEC) NOT DETECTED NOT DETECTED Corrected    Comment: CORRECTED ON 03/19 AT 0238:  PREVIOUSLY REPORTED AS NONE DETECTED   Shiga like toxin producing E coli (STEC) NOT DETECTED NOT DETECTED Corrected    Comment: CORRECTED ON 03/19 AT 0238: PREVIOUSLY REPORTED AS NONE DETECTED   E. coli O157 NOT DETECTED NOT DETECTED Corrected    Comment: CORRECTED ON 03/19 AT 0238: PREVIOUSLY REPORTED AS NONE DETECTED   Shigella/Enteroinvasive E coli (EIEC) NOT DETECTED NOT DETECTED Corrected    Comment: CORRECTED ON 03/19 AT 0238: PREVIOUSLY REPORTED AS NONE DETECTED   Cryptosporidium NOT DETECTED NOT DETECTED Corrected    Comment: CORRECTED ON 03/19 AT 0238: PREVIOUSLY REPORTED AS NONE DETECTED   Cyclospora cayetanensis NOT DETECTED NOT DETECTED Corrected    Comment: CORRECTED ON 03/19  AT 0238: PREVIOUSLY REPORTED AS NONE DETECTED   Entamoeba histolytica NOT DETECTED NOT DETECTED Corrected    Comment: CORRECTED ON 03/19 AT 0238: PREVIOUSLY REPORTED AS NONE DETECTED   Giardia lamblia NOT DETECTED NOT DETECTED Corrected    Comment: CORRECTED ON 03/19 AT 0238: PREVIOUSLY REPORTED AS NONE DETECTED   Adenovirus F40/41 NOT DETECTED NOT DETECTED Corrected    Comment: CORRECTED ON 03/19 AT 0238: PREVIOUSLY REPORTED AS NONE DETECTED   Astrovirus NOT DETECTED NOT DETECTED Corrected    Comment: CORRECTED ON 03/19 AT 0238: PREVIOUSLY REPORTED AS NONE DETECTED   Norovirus GI/GII NOT DETECTED NOT DETECTED Corrected    Comment: CORRECTED ON 03/19 AT 0238: PREVIOUSLY REPORTED AS NONE DETECTED   Rotavirus A NOT DETECTED NOT DETECTED Corrected    Comment: CORRECTED ON 03/19 AT 0238: PREVIOUSLY REPORTED AS NONE DETECTED   Sapovirus (I, II, IV, and V) NOT DETECTED NOT DETECTED Corrected    Comment: Performed at [facility], [address] [place] [place], [place] [address] CORRECTED ON 03/19 AT 6045: PREVIOUSLY REPORTED AS NONE DETECTED   C Difficile Quick Screen w PCR reflex     Status: None   Collection Time: 07/28/23 10:29 PM   Specimen: STOOL  Result Value Ref Range Status   C Diff antigen  NEGATIVE NEGATIVE Final   C Diff toxin NEGATIVE NEGATIVE Final   C Diff interpretation No C. difficile detected.  Final    Comment: Performed at [facility], [address] [place], [place] [address]  Resp panel by RT-PCR (RSV, Flu A&B, Covid) Anterior Nasal Swab     Status: Abnormal   Collection Time: 08/04/23 10:14 AM   Specimen: Anterior Nasal Swab  Result Value Ref Range Status   SARS Coronavirus 2 by RT PCR NEGATIVE NEGATIVE Final    Comment: (NOTE) SARS-CoV-2 target nucleic acids are NOT DETECTED.  The SARS-CoV-2 RNA is generally detectable in upper respiratory specimens during the acute phase of infection. The lowest concentration of SARS-CoV-2 viral copies this assay can detect is 138 copies/mL. A negative result does not preclude SARS-Cov-2 infection and should not be used as the sole basis for treatment or other patient management decisions. A negative result may occur with  improper specimen collection/handling, submission of specimen other than nasopharyngeal swab, presence of viral mutation(s) within the areas targeted by this assay, and inadequate number of viral copies(<138 copies/mL). A negative result must be combined with clinical observations, patient history, and epidemiological information. The expected result is Negative.  Fact Sheet for Patients:  BloggerCourse.com  Fact Sheet for Healthcare Providers:  SeriousBroker.it  This test is no t yet approved or cleared by the [place] FDA and  has been authorized for detection and/or diagnosis of SARS-CoV-2 by FDA under an Emergency Use Authorization (EUA). This EUA will remain  in effect (meaning this test can be used) for the duration of the COVID-19 declaration under Section 564(b)(1) of the Act, 21 U.S.C.section 360bbb-3(b)(1), unless the authorization is terminated  or revoked sooner.       Influenza A by PCR POSITIVE (A) NEGATIVE Final    Influenza B by PCR NEGATIVE NEGATIVE Final    Comment: (NOTE) The Xpert Xpress SARS-CoV-2/FLU/RSV plus assay is intended as an aid in the diagnosis of influenza from Nasopharyngeal swab specimens and should not be used as a sole basis for treatment. Nasal washings and aspirates are unacceptable for Xpert Xpress SARS-CoV-2/FLU/RSV testing.  Fact Sheet for Patients: BloggerCourse.com  Fact Sheet for Healthcare Providers: SeriousBroker.it  This test is not yet approved or cleared by the [place] and has been authorized for detection and/or diagnosis of SARS-CoV-2 by FDA under an Emergency Use Authorization (EUA). This EUA will remain in effect (meaning this test can be used) for the duration of the COVID-19 declaration under Section 564(b)(1) of the Act, 21 U.S.C. section 360bbb-3(b)(1), unless the authorization is terminated or revoked.     Resp Syncytial Virus by PCR NEGATIVE NEGATIVE Final    Comment: (NOTE) Fact Sheet for Patients: BloggerCourse.com  Fact Sheet for Healthcare Providers: SeriousBroker.it  This test is not yet approved or cleared by the [place] FDA and has been authorized for detection and/or diagnosis of SARS-CoV-2 by FDA under an Emergency Use Authorization (EUA). This EUA will remain in effect (meaning this test can be used) for the duration of the COVID-19 declaration under Section 564(b)(1) of the Act, 21 U.S.C. section 360bbb-3(b)(1), unless the authorization is terminated or revoked.  Performed at [facility], [address] [place], [place] [address]     Coagulation Studies: No results for input(s): "LABPROT", "INR" in the last 72 hours.  Urinalysis: No results for input(s): "COLORURINE", "LABSPEC", "PHURINE", "GLUCOSEU", "HGBUR", "BILIRUBINUR", "KETONESUR", "PROTEINUR", "UROBILINOGEN", "NITRITE", "LEUKOCYTESUR" in the  last 72 hours.  Invalid input(s): "APPERANCEUR"     Imaging: DG Chest Port 1 View Result Date: 08/04/2023 CLINICAL DATA:  Cough. EXAM: PORTABLE CHEST 1 VIEW COMPARISON:  Chest radiograph dated 02/24/2019 and CT dated 04/30/2021. FINDINGS: Enteric tube extends below the diaphragm with tip beyond the inferior margin of the image. An area of increased density in the right mid lung field, likely related to skin fold or atelectasis. Developing infiltrate is less likely. No pleural effusion pneumothorax. The cardiac silhouette is within normal limits. Atherosclerotic calcification of the aorta. No acute osseous pathology. IMPRESSION: 1. Enteric tube extends below the diaphragm with tip beyond the inferior margin of the image. 2. Probable skin fold or atelectasis in the right mid lung field. Electronically Signed   By: Elgie Collard M.D.   On: 08/04/2023 14:57      Medications:    feeding supplement (VITAL 1.5 CAL) 55 mL/hr at 08/05/23 0800     amLODipine  5 mg Oral QHS   apixaban  5 mg Oral BID   Chlorhexidine Gluconate Cloth  6 each Topical Daily   free water  100 mL Per Tube Q4H   insulin aspart  0-15 Units Subcutaneous Q4H   insulin aspart  3 Units Subcutaneous Q4H   levothyroxine  150 mcg Oral Daily   liothyronine  50 mcg Oral q morning   metoprolol succinate  25 mg Oral Daily   multivitamin  1 tablet Oral QHS   multivitamin with minerals  1 tablet Oral Daily   oseltamivir  30 mg Oral Q48H   pantoprazole  40 mg Oral BID   polyethylene glycol  17 g Oral BID   Ensure Max Protein  11 oz Oral BID   sodium chloride flush  10-40 mL Intracatheter Q12H   guaiFENesin-dextromethorphan, lactulose, ondansetron (ZOFRAN) IV, mouth rinse, oxyCODONE-acetaminophen, sodium chloride flush  Assessment/ Plan:  78 y.o. female : Pt is a 77 y.o. female with a PMHx of hypothyroidism, diabetes mellitus type 2 treated with Ozempic and metformin, hypertension, hyperlipidemia, atrial fibrillation, basal  cell carcinoma, cystocele, depression, glaucoma, urinary incontinence who was admitted to [facility] on 07/28/2023 for evaluation of nausea, vomiting, loose stools, and abdominal pain.   1.  Acute kidney injury/chronic kidney disease stage  IIIb baseline EGFR 31/diabetes mellitus type 2 with chronic kidney disease/hyperkalemia.  Patient admitted with severe acute kidney injury.  Suspect development of ATN from prolonged nausea and vomiting.  CRRT stopped 07/30/2023.  Update: Patient received dialysis yesterday, no UF.  Creatinine is improved today for this reason.  Will continue to monitor renal indices and encourage staff to monitor and record all urine output.  Will hold dialysis and assess renal recovery.  2.  Acute metabolic acidosis.  Ozempic and metformin likely playing some role here.  Corrected   3.  Pancreatitis.  Initial lipase noted to be 810.  CT scan abdomen pelvis reviewed.  Lipase may have been elevated due to renal failure.  Continues to complain of intermittent abdominal pain.  4. Hyponatremia, sodium 130. Patient receiving tube feeds with ordered flushes.  May consider decreasing scheduled free water flushes.   LOS: 8 Katherine [name] 3/26/20251:32 PM

## 2023-08-05 NOTE — Plan of Care (Signed)
 Received report, sitting up at bedside when checked. Reports abdominal pain, points to upper med abdomen. Denies nausea. Tolerating fluids, peg tube in place to L nares. Taped securely. R femoral triple lumen dialysis catheter in place and clamped. Continue to monitor.  Up to BR X3, medicated for abdominal pain Q4hrs as ordered. Unsure of last BM, given stool softener. Tolerating fluids well. Slept 4-6 hrs.

## 2023-08-05 NOTE — Progress Notes (Signed)
 PROGRESS NOTE  Katherine [name]    DOB: 1945/05/31, 78 y.o.  ZOX:[id number]    Code Status: Limited: Do not attempt resuscitation (DNR) -DNR-LIMITED -Do Not Intubate/DNI    DOA: 07/28/2023   LOS: 8   Brief hospital course  Katherine [name] is a 78 year old female with a past medical history significant for diabetes mellitus on metformin and Ozempic, hypertension, hyperlipidemia, anemia, cancer who presents to [facility] ED 07/28/2023 due to complaints of abdominal pain, nausea, vomiting, diarrhea x1 week, worsening.    03/18: admitted with Acute Pancreatitis vs. Gastroenteritis, along with Acute Kidney Injury, Hyperkalemia, and severe Anion Gap Metabolic Acidosis requiring initiation of CRRT.  03/19: renal indices are improving on HD. Persistent abd pain. Initiated CLD. 3/25: repeat HD due to continually increasing creatinine. Also diagnosed with influenza and started on tamiflu. NG tube in place- post pylorus.    08/05/23 -tolerating modest amounts of PO intake. NG feeds changed to nightly continuous.   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   S/p CRRT 07/28/23-07/30/23. Last HD 3/25 Nephrology following Monitor I&O Monitor BMP Hold nephrotoxic medications    Pancreatitis likely d/t GLP1 Rx Sepsis has been RULED OUT  Poor po intake d/t abd pain S/p feeding tube placement 07/31/23 Tube feeds per dietary recs, see orders  Advance po intake and taper tube feeds as able   Constipation- last BM two days ago - adding bowel regimen   Influenza- minimally symptomatic Tamiflu Supportive care    Hyperkalemia d/t renal failure - resolved Monitor BMP   Hypokalemia Replace as needed Monitor BMP   Acute anion gap metabolic acidosis - resolved Lactic Acidosis - resolved  Ozempic and metformin likely playing some role here.   Monitor BMP    Bradycardia, suspect due to  Hyperkalemia - resolved Can d/c telemetry, resume metoprolol    DM2 Hypoglycemia d/t poor po intake / antihyperglycemic medications - resolved now hyperglycemic Hold home Metformin, Pioglitazone, Sitagliptin SSI    Hypothyroidism  Synthroid   A.fib  Metoprolol Hold xarelto d/t renal fxn, can use eliquis   HTN Metoprolol, amlodipine Hold lasix, ACE/ARB d/t AKI   HLD Hold statin for now     Class 1 obesity based on BMI: Body mass index is 30.56 kg/m.  Body mass index is 31.04 kg/m.  VTE ppx: SCDs Start: 07/28/23 1333 apixaban (ELIQUIS) tablet 5 mg   Diet:     Diet   Diet regular Room service appropriate? Yes; Fluid consistency: Thin   Consultants: Nephrology  CCM  Subjective 08/05/23    Pt reports feeling improved today. Mild abdominal pain continues. Able to tolerate breakfast without nausea but has minimal appetite. Last BM 2 days ago but states that she tends to be constipated and go days between Bms. Has mild cough but no SOB.    Objective   Vitals:   08/04/23 1647 08/04/23 1937 08/05/23 0521 08/05/23 0722  BP: (!) 128/52 (!) 119/52 124/62 120/60  Pulse: (!) 104 94 89 87  Resp: 19 19 16 18   Temp: 99.3 F (37.4 C) 99.3 F (37.4 C) 98.5 F (36.9 C) 97.8 F (36.6 C)  TempSrc: Oral Oral Oral Oral  SpO2: 98% 97% 98% 97%  Weight:      Height:        Intake/Output Summary (Last 24 hours) at 08/05/2023 0730 Last data filed at 08/05/2023 0427 Gross per 24 hour  Intake 320 ml  Output 450 ml  Net -130 ml   Filed Weights   08/04/23 0451 08/04/23 1220 08/04/23 1553  Weight: 91.3 kg 89.9 kg 89.9 kg     Physical Exam:  General: awake, alert, NAD HEENT: atraumatic, clear conjunctiva, anicteric sclera, MMM, hearing grossly normal. NG tube in place Respiratory: normal respiratory effort. CTAB Cardiovascular: quick capillary refill, normal S1/S2, RRR, no JVD, murmurs Gastrointestinal: soft, ND, mildly tender at epigastric area Nervous: A&O x3. no gross  focal neurologic deficits, normal speech Extremities: moves all equally, no edema, normal tone Skin: dry, intact, normal temperature, normal color. No rashes, lesions or ulcers on exposed skin Psychiatry: normal mood, congruent affect  Labs   I have personally reviewed the following labs and imaging studies CBC    Component Value Date/Time   WBC 5.2 08/04/2023 1319   RBC 2.39 (L) 08/04/2023 1319   HGB 7.6 (L) 08/04/2023 1319   HGB 10.2 (L) 04/26/2019 1034   HCT 21.5 (L) 08/04/2023 1319   PLT 167 08/04/2023 1319   MCV 90.0 08/04/2023 1319   MCH 31.8 08/04/2023 1319   MCHC 35.3 08/04/2023 1319   RDW 15.6 (H) 08/04/2023 1319   LYMPHSABS 1.2 04/30/2021 1344   MONOABS 0.3 04/30/2021 1344   EOSABS 0.0 04/30/2021 1344   BASOSABS 0.1 04/30/2021 1344      Latest Ref Rng & Units 08/05/2023    5:02 AM 08/04/2023    5:08 AM 08/03/2023    5:19 AM  BMP  Glucose 70 - 99 mg/dL 191  478  295   BUN 8 - 23 mg/dL 31  50  43   Creatinine 0.44 - 1.00 mg/dL 6.21  3.08  6.57   Sodium 135 - 145 mmol/L 130  133  133   Potassium 3.5 - 5.1 mmol/L 4.4  4.3  4.1   Chloride 98 - 111 mmol/L 96  98  97   CO2 22 - 32 mmol/L 28  24  24    Calcium 8.9 - 10.3 mg/dL 7.6  7.6  7.5     DG Chest Port 1 View Result Date: 08/04/2023 CLINICAL DATA:  Cough. EXAM: PORTABLE CHEST 1 VIEW COMPARISON:  Chest radiograph dated 02/24/2019 and CT dated 04/30/2021. FINDINGS: Enteric tube extends below the diaphragm with tip beyond the inferior margin of the image. An area of increased density in the right mid lung field, likely related to skin fold or atelectasis. Developing infiltrate is less likely. No pleural effusion pneumothorax. The cardiac silhouette is within normal limits. Atherosclerotic calcification of the aorta. No acute osseous pathology. IMPRESSION: 1. Enteric tube extends below the diaphragm with tip beyond the inferior margin of the image. 2. Probable skin fold or atelectasis in the right mid lung field.  Electronically Signed   By: Elgie Collard M.D.   On: 08/04/2023 14:57    Disposition Plan & Communication  Patient status: Inpatient  Admitted From: Home Planned disposition location: Home Anticipated discharge date: 3/28 pending clinical improvement. Removal of NG tube  Family Communication: none at bedside    Author: Leeroy Bock, DO [facility] 08/05/2023, 7:30 AM   Available by Epic secure chat 7AM-7PM. If 7PM-7AM, please contact night-coverage.  [facility] contact information found on [email or web address].

## 2023-08-05 NOTE — Inpatient Diabetes Management (Signed)
 Inpatient Diabetes Program Recommendations  AACE/ADA: New Consensus Statement on Inpatient Glycemic Control  Target Ranges:  Prepandial:   less than 140 mg/dL      Peak postprandial:   less than 180 mg/dL (1-2 hours)      Critically ill patients:  140 - 180 mg/dL    Latest Reference Range & Units 08/05/23 00:36 08/05/23 04:14 08/05/23 07:21  Glucose-Capillary 70 - 99 mg/dL 161 (H) 096 (H)  Novolog 5 units  175 (H)    Latest Reference Range & Units 08/04/23 04:14 08/04/23 07:33 08/04/23 12:03 08/04/23 17:01 08/04/23 19:34  Glucose-Capillary 70 - 99 mg/dL 045 (H)  Novolog 3 units  115 (H)  Novolog 3 units  181 (H) 196 (H)  Novolog 6 units  212 (H)  Novolog 5 units    Review of Glycemic Control  Diabetes history: DM2 Outpatient Diabetes medications: Amaryl 2 mg BID, Metformin 1000 mg BID, Actos 30 mg daily, Januvia 100 mg daily (per H&P, pt is taking Ozempic Qweek)  Current orders for Inpatient glycemic control: Novolog 0-15 units Q4H, Novolog 3 units Q4H for tube feeding; Vital @ 55 ml/hr  Inpatient Diabetes Program Recommendations:    Insulin: Noted Novolog 3 units for tube feeding was not given at all during the night. NURSING: If patient is receiving tube feeding, please administer Novolog 3 units Q4H as ordered.   Thanks, Orlando Penner, RN, MSN, CDCES Diabetes Coordinator Inpatient Diabetes Program [phone] (Team Pager from 8am to 5pm)

## 2023-08-05 NOTE — Progress Notes (Addendum)
 PHARMACY NOTE:  ANTIMICROBIAL RENAL DOSAGE ADJUSTMENT  Current antimicrobial regimen includes a mismatch between antimicrobial dosage and estimated renal function.  As per policy approved by the Pharmacy & Therapeutics and Medical Executive Committees, the antimicrobial dosage will be adjusted accordingly.  Current antimicrobial dosage:  Tamiflu 30 mg Q48H  Indication: Influenza A  Renal Function:  Estimated Creatinine Clearance: 25 mL/min (A) (by C-G formula based on SCr of 2.24 mg/dL (H)). []      On intermittent HD, scheduled: []      On CRRT    Antimicrobial dosage has been changed to:  Tamiflu 30 mg daily  Additional comments: Monitor renal function and if patient requires any additional dialysis treatments. Right now just monitoring for renal function improvement.   Thank you for allowing pharmacy to be a part of this patient's care.  Effie Shy, PharmD Pharmacy Resident  08/05/2023 3:37 PM

## 2023-08-06 ENCOUNTER — Inpatient Hospital Stay

## 2023-08-06 DIAGNOSIS — K8500 Idiopathic acute pancreatitis without necrosis or infection: Secondary | ICD-10-CM | POA: Diagnosis not present

## 2023-08-06 DIAGNOSIS — N170 Acute kidney failure with tubular necrosis: Secondary | ICD-10-CM | POA: Diagnosis not present

## 2023-08-06 DIAGNOSIS — J09X1 Influenza due to identified novel influenza A virus with pneumonia: Secondary | ICD-10-CM | POA: Diagnosis not present

## 2023-08-06 LAB — BASIC METABOLIC PANEL WITH GFR
Anion gap: 10 (ref 5–15)
BUN: 41 mg/dL — ABNORMAL HIGH (ref 8–23)
CO2: 26 mmol/L (ref 22–32)
Calcium: 8.2 mg/dL — ABNORMAL LOW (ref 8.9–10.3)
Chloride: 100 mmol/L (ref 98–111)
Creatinine, Ser: 2.55 mg/dL — ABNORMAL HIGH (ref 0.44–1.00)
GFR, Estimated: 19 mL/min — ABNORMAL LOW (ref >60)
Glucose, Bld: 155 mg/dL — ABNORMAL HIGH (ref 70–99)
Potassium: 4.5 mmol/L (ref 3.5–5.1)
Sodium: 136 mmol/L (ref 135–145)

## 2023-08-06 LAB — GLUCOSE, CAPILLARY
Glucose-Capillary: 140 mg/dL — ABNORMAL HIGH (ref 70–99)
Glucose-Capillary: 142 mg/dL — ABNORMAL HIGH (ref 70–99)
Glucose-Capillary: 160 mg/dL — ABNORMAL HIGH (ref 70–99)
Glucose-Capillary: 184 mg/dL — ABNORMAL HIGH (ref 70–99)
Glucose-Capillary: 210 mg/dL — ABNORMAL HIGH (ref 70–99)

## 2023-08-06 LAB — MAGNESIUM: Magnesium: 1.8 mg/dL (ref 1.7–2.4)

## 2023-08-06 LAB — PHOSPHORUS: Phosphorus: 3.9 mg/dL (ref 2.5–4.6)

## 2023-08-06 MED ORDER — LACTULOSE 10 GM/15ML PO SOLN
10.0000 g | Freq: Every day | ORAL | Status: DC
  Administered 2023-08-06: 10 g
  Filled 2023-08-06 (×2): qty 30

## 2023-08-06 MED ORDER — INSULIN ASPART 100 UNIT/ML IJ SOLN
0.0000 [IU] | Freq: Three times a day (TID) | INTRAMUSCULAR | Status: DC
  Administered 2023-08-06: 2 [IU] via SUBCUTANEOUS
  Administered 2023-08-07 (×2): 8 [IU] via SUBCUTANEOUS
  Filled 2023-08-06 (×3): qty 1

## 2023-08-06 NOTE — Progress Notes (Signed)
 [facility]  ROUNDING NOTE   Subjective:   Patient seen sitting up in bed Appears well Husband and son at bedside Tube feeds transitioned to nocturnal Patient states she was able to consume more of breakfast.   03/26 0701 - 03/27 0700 In: 4171.2 [P.O.:480; NG/GT:3691.2] Out: 1550 [Urine:1550] Lab Results  Component Value Date   CREATININE 2.55 (H) 08/06/2023   CREATININE 2.24 (H) 08/05/2023   CREATININE 3.46 (H) 08/04/2023     Objective:  Vital signs in last 24 hours:  Temp:  [97.8 F (36.6 C)-98.4 F (36.9 C)] 98.4 F (36.9 C) (03/27 0905) Pulse Rate:  [86-94] 89 (03/27 0905) Resp:  [16] 16 (03/27 0403) BP: (123-137)/(63-66) 137/66 (03/27 0905) SpO2:  [98 %] 98 % (03/27 0905) Weight:  [93.6 kg] 93.6 kg (03/27 0425)  Weight change: 3.7 kg Filed Weights   08/04/23 1220 08/04/23 1553 08/06/23 0425  Weight: 89.9 kg 89.9 kg 93.6 kg    Intake/Output: I/O last 3 completed shifts: In: 4491.2 [P.O.:800; NG/GT:3691.2] Out: 1900 [Urine:1900]   Intake/Output this shift:  Total I/O In: 0  Out: 300 [Urine:300]  Physical Exam: General: No acute distress  Head: Normocephalic, atraumatic. Moist oral mucosal membranes  Lungs:  Clear to auscultation, normal effort  Heart: S1S2 no rubs  Abdomen:  Soft, nontender, bowel sounds present  Extremities: No peripheral edema.  Neurologic: Awake, alert, following commands  Skin: No acute rash  Access: Right femoral dialysis catheter    Basic Metabolic Panel: Recent Labs  Lab 08/02/23 0609 08/03/23 0519 08/04/23 0508 08/05/23 0502 08/06/23 0452  NA 133* 133* 133* 130* 136  K 4.2 4.1 4.3 4.4 4.5  CL 99 97* 98 96* 100  CO2 23 24 24 28 26   GLUCOSE 321* 201* 125* 270* 155*  BUN 35* 43* 50* 31* 41*  CREATININE 3.55* 3.59* 3.46* 2.24* 2.55*  CALCIUM 7.6* 7.5* 7.6* 7.6* 8.2*  MG 2.0 1.8 1.8 1.7 1.8  PHOS 4.1 3.6 4.0 2.8 3.9    Liver Function Tests: No results for input(s): "AST", "ALT", "ALKPHOS",  "BILITOT", "PROT", "ALBUMIN" in the last 168 hours.  No results for input(s): "LIPASE", "AMYLASE" in the last 168 hours.  No results for input(s): "AMMONIA" in the last 168 hours.  CBC: Recent Labs  Lab 07/31/23 0457 08/01/23 0519 08/04/23 1319  WBC 8.7 9.2 5.2  HGB 9.6* 10.2* 7.6*  HCT 27.7* 29.5* 21.5*  MCV 89.4 89.7 90.0  PLT 156 172 167    Cardiac Enzymes: No results for input(s): "CKTOTAL", "CKMB", "CKMBINDEX", "TROPONINI" in the last 168 hours.  BNP: Invalid input(s): "POCBNP"  CBG: Recent Labs  Lab 08/05/23 2118 08/06/23 0002 08/06/23 0414 08/06/23 0859 08/06/23 1138  GLUCAP 133* 210* 140* 142* 184*    Microbiology: Results for orders placed or performed during the hospital encounter of 07/28/23  MRSA Next Gen by PCR, Nasal     Status: None   Collection Time: 07/28/23  2:30 PM   Specimen: Nasal Mucosa; Nasal Swab  Result Value Ref Range Status   MRSA by PCR Next Gen NOT DETECTED NOT DETECTED Final    Comment: (NOTE) The GeneXpert MRSA Assay (FDA approved for NASAL specimens only), is one component of a comprehensive MRSA colonization surveillance program. It is not intended to diagnose MRSA infection nor to guide or monitor treatment for MRSA infections. Test performance is not FDA approved in patients less than 69 years old. Performed at [facility], [address] [place], [place] [address]   Culture,  blood (Routine X 2) w Reflex to ID Panel     Status: None   Collection Time: 07/28/23  3:32 PM   Specimen: BLOOD  Result Value Ref Range Status   Specimen Description BLOOD BLOOD LEFT WRIST  Final   Special Requests   Final    BOTTLES DRAWN AEROBIC ONLY Blood Culture results may not be optimal due to an inadequate volume of blood received in culture bottles   Culture   Final    NO GROWTH 5 DAYS Performed at [facility], [address] [place], [place] [address]    Report Status 08/02/2023 FINAL  Final  Culture, blood  (Routine X 2) w Reflex to ID Panel     Status: None   Collection Time: 07/28/23  3:42 PM   Specimen: BLOOD  Result Value Ref Range Status   Specimen Description BLOOD BLOOD RIGHT HAND  Final   Special Requests   Final    BOTTLES DRAWN AEROBIC AND ANAEROBIC Blood Culture results may not be optimal due to an inadequate volume of blood received in culture bottles   Culture   Final    NO GROWTH 5 DAYS Performed at [facility], [address] [place], [place] [address]    Report Status 08/02/2023 FINAL  Final  Gastrointestinal Panel by PCR , Stool     Status: None   Collection Time: 07/28/23 10:29 PM   Specimen: Stool  Result Value Ref Range Status   Campylobacter species NOT DETECTED NOT DETECTED Corrected    Comment: CORRECTED ON 03/19 AT 0238: PREVIOUSLY REPORTED AS NONE DETECTED   Plesimonas shigelloides NOT DETECTED NOT DETECTED Corrected    Comment: CORRECTED ON 03/19 AT 0238: PREVIOUSLY REPORTED AS NONE DETECTED   Salmonella species NOT DETECTED NOT DETECTED Corrected    Comment: CORRECTED ON 03/19 AT 0238: PREVIOUSLY REPORTED AS NONE DETECTED   Yersinia enterocolitica NOT DETECTED NOT DETECTED Corrected    Comment: CORRECTED ON 03/19 AT 0238: PREVIOUSLY REPORTED AS NONE DETECTED   Vibrio species NOT DETECTED NOT DETECTED Corrected    Comment: CORRECTED ON 03/19 AT 0238: PREVIOUSLY REPORTED AS NONE DETECTED   Vibrio cholerae NOT DETECTED NOT DETECTED Corrected    Comment: CORRECTED ON 03/19 AT 0238: PREVIOUSLY REPORTED AS NONE DETECTED   Enteroaggregative E coli (EAEC) NOT DETECTED NOT DETECTED Corrected    Comment: CORRECTED ON 03/19 AT 0238: PREVIOUSLY REPORTED AS NONE DETECTED   Enteropathogenic E coli (EPEC) NOT DETECTED NOT DETECTED Corrected    Comment: CORRECTED ON 03/19 AT 0238: PREVIOUSLY REPORTED AS NONE DETECTED   Enterotoxigenic E coli (ETEC) NOT DETECTED NOT DETECTED Corrected    Comment: CORRECTED ON 03/19 AT 0238: PREVIOUSLY REPORTED AS NONE DETECTED    Shiga like toxin producing E coli (STEC) NOT DETECTED NOT DETECTED Corrected    Comment: CORRECTED ON 03/19 AT 0238: PREVIOUSLY REPORTED AS NONE DETECTED   E. coli O157 NOT DETECTED NOT DETECTED Corrected    Comment: CORRECTED ON 03/19 AT 0238: PREVIOUSLY REPORTED AS NONE DETECTED   Shigella/Enteroinvasive E coli (EIEC) NOT DETECTED NOT DETECTED Corrected    Comment: CORRECTED ON 03/19 AT 0238: PREVIOUSLY REPORTED AS NONE DETECTED   Cryptosporidium NOT DETECTED NOT DETECTED Corrected    Comment: CORRECTED ON 03/19 AT 0238: PREVIOUSLY REPORTED AS NONE DETECTED   Cyclospora cayetanensis NOT DETECTED NOT DETECTED Corrected    Comment: CORRECTED ON 03/19 AT 0238: PREVIOUSLY REPORTED AS NONE DETECTED   Entamoeba histolytica NOT DETECTED NOT DETECTED Corrected  Comment: CORRECTED ON 03/19 AT 0238: PREVIOUSLY REPORTED AS NONE DETECTED   Giardia lamblia NOT DETECTED NOT DETECTED Corrected    Comment: CORRECTED ON 03/19 AT 0238: PREVIOUSLY REPORTED AS NONE DETECTED   Adenovirus F40/41 NOT DETECTED NOT DETECTED Corrected    Comment: CORRECTED ON 03/19 AT 0238: PREVIOUSLY REPORTED AS NONE DETECTED   Astrovirus NOT DETECTED NOT DETECTED Corrected    Comment: CORRECTED ON 03/19 AT 7829: PREVIOUSLY REPORTED AS NONE DETECTED   Norovirus GI/GII NOT DETECTED NOT DETECTED Corrected    Comment: CORRECTED ON 03/19 AT 0238: PREVIOUSLY REPORTED AS NONE DETECTED   Rotavirus A NOT DETECTED NOT DETECTED Corrected    Comment: CORRECTED ON 03/19 AT 0238: PREVIOUSLY REPORTED AS NONE DETECTED   Sapovirus (I, II, IV, and V) NOT DETECTED NOT DETECTED Corrected    Comment: Performed at [facility], [address] [place], [place] [address] CORRECTED ON 03/19 AT 0865: PREVIOUSLY REPORTED AS NONE DETECTED   C Difficile Quick Screen w PCR reflex     Status: None   Collection Time: 07/28/23 10:29 PM   Specimen: STOOL  Result Value Ref Range Status   C Diff antigen NEGATIVE NEGATIVE Final   C Diff toxin  NEGATIVE NEGATIVE Final   C Diff interpretation No C. difficile detected.  Final    Comment: Performed at [facility], [address] [place], [place] [address]  Resp panel by RT-PCR (RSV, Flu A&B, Covid) Anterior Nasal Swab     Status: Abnormal   Collection Time: 08/04/23 10:14 AM   Specimen: Anterior Nasal Swab  Result Value Ref Range Status   SARS Coronavirus 2 by RT PCR NEGATIVE NEGATIVE Final    Comment: (NOTE) SARS-CoV-2 target nucleic acids are NOT DETECTED.  The SARS-CoV-2 RNA is generally detectable in upper respiratory specimens during the acute phase of infection. The lowest concentration of SARS-CoV-2 viral copies this assay can detect is 138 copies/mL. A negative result does not preclude SARS-Cov-2 infection and should not be used as the sole basis for treatment or other patient management decisions. A negative result may occur with  improper specimen collection/handling, submission of specimen other than nasopharyngeal swab, presence of viral mutation(s) within the areas targeted by this assay, and inadequate number of viral copies(<138 copies/mL). A negative result must be combined with clinical observations, patient history, and epidemiological information. The expected result is Negative.  Fact Sheet for Patients:  BloggerCourse.com  Fact Sheet for Healthcare Providers:  SeriousBroker.it  This test is no t yet approved or cleared by the [place] FDA and  has been authorized for detection and/or diagnosis of SARS-CoV-2 by FDA under an Emergency Use Authorization (EUA). This EUA will remain  in effect (meaning this test can be used) for the duration of the COVID-19 declaration under Section 564(b)(1) of the Act, 21 U.S.C.section 360bbb-3(b)(1), unless the authorization is terminated  or revoked sooner.       Influenza A by PCR POSITIVE (A) NEGATIVE Final   Influenza B by PCR NEGATIVE NEGATIVE  Final    Comment: (NOTE) The Xpert Xpress SARS-CoV-2/FLU/RSV plus assay is intended as an aid in the diagnosis of influenza from Nasopharyngeal swab specimens and should not be used as a sole basis for treatment. Nasal washings and aspirates are unacceptable for Xpert Xpress SARS-CoV-2/FLU/RSV testing.  Fact Sheet for Patients: BloggerCourse.com  Fact Sheet for Healthcare Providers: SeriousBroker.it  This test is not yet approved or cleared by the [place] FDA and has been authorized for detection  and/or diagnosis of SARS-CoV-2 by FDA under an Emergency Use Authorization (EUA). This EUA will remain in effect (meaning this test can be used) for the duration of the COVID-19 declaration under Section 564(b)(1) of the Act, 21 U.S.C. section 360bbb-3(b)(1), unless the authorization is terminated or revoked.     Resp Syncytial Virus by PCR NEGATIVE NEGATIVE Final    Comment: (NOTE) Fact Sheet for Patients: BloggerCourse.com  Fact Sheet for Healthcare Providers: SeriousBroker.it  This test is not yet approved or cleared by the [place] FDA and has been authorized for detection and/or diagnosis of SARS-CoV-2 by FDA under an Emergency Use Authorization (EUA). This EUA will remain in effect (meaning this test can be used) for the duration of the COVID-19 declaration under Section 564(b)(1) of the Act, 21 U.S.C. section 360bbb-3(b)(1), unless the authorization is terminated or revoked.  Performed at [facility], [address] [place], [place] [address]     Coagulation Studies: No results for input(s): "LABPROT", "INR" in the last 72 hours.  Urinalysis: No results for input(s): "COLORURINE", "LABSPEC", "PHURINE", "GLUCOSEU", "HGBUR", "BILIRUBINUR", "KETONESUR", "PROTEINUR", "UROBILINOGEN", "NITRITE", "LEUKOCYTESUR" in the last 72 hours.  Invalid input(s):  "APPERANCEUR"     Imaging: DG Abd 1 View Result Date: 08/06/2023 CLINICAL DATA:  [id number] Constipation [id number] EXAM: ABDOMEN - 1 VIEW COMPARISON:  07/28/2023 FINDINGS: Enteric tube terminates within the third portion of the duodenum. Right femoral central line terminates in the right common iliac region. Nonobstructive bowel gas pattern with air-filled loops of large and small bowel. Stool is seen throughout the colon and rectum, overall mild in volume. Height loss of the L2 and L3 vertebral bodies. IMPRESSION: 1. Nonobstructive bowel gas pattern. 2. Mild stool burden. 3. Height loss of the L3 vertebral body which was not seen on the previous CT. Recommend dedicated lumbar spine radiographs. Electronically Signed   By: Duanne Guess D.O.   On: 08/06/2023 11:47      Medications:       amLODipine  5 mg Oral QHS   apixaban  5 mg Oral BID   Chlorhexidine Gluconate Cloth  6 each Topical Daily   feeding supplement (VITAL 1.5 CAL)  1,000 mL Per Tube Q24H   free water  30 mL Per Tube Q4H   insulin aspart  0-15 Units Subcutaneous TID WC   insulin aspart  3 Units Subcutaneous Q4H   lactulose  10 g Per Tube Daily   levothyroxine  150 mcg Oral Daily   liothyronine  50 mcg Oral q morning   metoprolol succinate  25 mg Oral Daily   multivitamin  1 tablet Oral QHS   oseltamivir  30 mg Oral Q24H   pantoprazole  40 mg Oral BID   polyethylene glycol  17 g Oral BID   Ensure Max Protein  11 oz Oral BID   sodium chloride flush  10-40 mL Intracatheter Q12H   guaiFENesin-dextromethorphan, ondansetron (ZOFRAN) IV, mouth rinse, oxyCODONE-acetaminophen, sodium chloride flush  Assessment/ Plan:  78 y.o. female : Pt is a 78 y.o. female with a PMHx of hypothyroidism, diabetes mellitus type 2 treated with Ozempic and metformin, hypertension, hyperlipidemia, atrial fibrillation, basal cell carcinoma, cystocele, depression, glaucoma, urinary incontinence who was admitted to [facility] on 07/28/2023 for evaluation of  nausea, vomiting, loose stools, and abdominal pain.   1.  Acute kidney injury/chronic kidney disease stage IIIb baseline EGFR 31/diabetes mellitus type 2 with chronic kidney disease/hyperkalemia.  Patient admitted with severe acute kidney injury.  Suspect development of ATN from prolonged  nausea and vomiting.  CRRT stopped 07/30/2023.  Update: Last dialysis treatment received on 08/04/2023.  Patient has made adequate amount of urine, 1.5 L in preceding 24 hours.  Slightly elevated creatinine as expected today.  Will request HD temp cath to be removed and monitor renal indices.  If further dialysis is needed, we will request tunneled access.  Will continue to monitor patient during this admission.  2.  Acute metabolic acidosis.  Ozempic and metformin likely playing some role here.  Corrected   3.  Pancreatitis.  Initial lipase noted to be 810.  CT scan abdomen pelvis reviewed.  Lipase may have been elevated due to renal failure.  Continues to complain of intermittent abdominal pain.  4. Hyponatremia, sodium 136. Patient receiving tube feeds with ordered flushes.  Free water flushes decreased.   LOS: 9 Emerlyn Mehlhoff 3/27/20254:19 PM

## 2023-08-06 NOTE — Care Management Important Message (Signed)
 Important Message  Patient Details  Name: Katherine [name] MRN: [id number] Date of Birth: Sep 11, 1945   Important Message Given:  Yes - Medicare IM     Bernadette Hoit 08/06/2023, 2:30 PM

## 2023-08-06 NOTE — Progress Notes (Signed)
 Primary nurse notified regarding HD cath removal order, nurse instructed to notify dialysis nurse for removal assistance.

## 2023-08-06 NOTE — Progress Notes (Signed)
 PROGRESS NOTE  Katherine [name]    DOB: Sep 16, 1945, 78 y.o.  ZOX:[id number]    Code Status: Limited: Do not attempt resuscitation (DNR) -DNR-LIMITED -Do Not Intubate/DNI    DOA: 07/28/2023   LOS: 9   Brief hospital course  Katherine [name] is a 78 year old female with a past medical history significant for diabetes mellitus on metformin and Ozempic, hypertension, hyperlipidemia, anemia, cancer who presents to [facility] ED 07/28/2023 due to complaints of abdominal pain, nausea, vomiting, diarrhea x1 week, worsening.    03/18: admitted with Acute Pancreatitis vs. Gastroenteritis, along with Acute Kidney Injury, Hyperkalemia, and severe Anion Gap Metabolic Acidosis requiring initiation of CRRT.  03/19: renal indices are improving on HD. Persistent abd pain. Initiated CLD. 3/25: repeat HD due to continually increasing creatinine. Also diagnosed with influenza and started on tamiflu. NG tube in place- post pylorus.   3/26-03/27/25 -tolerating modest amounts of PO intake. NG feeds changed to nightly continuous. KUB confirms enteric tube placement. Holding off on HD for now to monitor for renal recovery.   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   S/p CRRT 07/28/23-07/30/23. Last HD 3/25 Nephrology following Holding HD for now and monitoring renal recovery. Cr 2.55 today Monitor I&O Monitor BMP Hold nephrotoxic medications    Pancreatitis likely d/t GLP1 Rx- abdominal pain improving Sepsis has been RULED OUT  Poor po intake d/t abd pain S/p feeding tube placement 07/31/23- placement confirmed on KUB today Tube feeds per dietary recs, see orders  Advance po intake and taper tube feeds as able   Constipation- last BM three days ago. Only mild stool burden on KUB 3/27.  - adding bowel regimen   Influenza- minimally symptomatic Tamiflu Supportive care    Hyperkalemia d/t renal failure -  resolved Monitor BMP   Hypokalemia Replace as needed Monitor BMP   Acute anion gap metabolic acidosis - resolved Lactic Acidosis - resolved  Ozempic and metformin likely playing some role here.   Monitor BMP    Bradycardia, suspect due to Hyperkalemia - resolved Can d/c telemetry, resume metoprolol    DM2 Hypoglycemia d/t poor po intake / antihyperglycemic medications - resolved now hyperglycemic Hold home Metformin, Pioglitazone, Sitagliptin SSI    Hypothyroidism  Synthroid   A.fib  Metoprolol Hold xarelto d/t renal fxn, can use eliquis   HTN Metoprolol, amlodipine Hold lasix, ACE/ARB d/t AKI   HLD Hold statin for now     Class 1 obesity based on BMI: Body mass index is 30.56 kg/m.  Body mass index is 32.32 kg/m.  VTE ppx: SCDs Start: 07/28/23 1333 apixaban (ELIQUIS) tablet 5 mg   Diet:     Diet   Diet regular Room service appropriate? Yes; Fluid consistency: Thin   Consultants: Nephrology  CCM  Subjective 08/06/23    Pt reports feeling better. Abdominal pain less today. Tolerating more PO yesterday. Still no BM.    Objective   Vitals:   08/05/23 1424 08/05/23 2043 08/06/23 0403 08/06/23 0425  BP: (!) 129/57 123/63 126/65   Pulse: 85 94 86   Resp: 16 16 16    Temp: 98 F (36.7 C) 97.8 F (36.6 C) 97.8 F (36.6 C)   TempSrc: Oral Oral    SpO2: 100% 98% 98%   Weight:    93.6 kg  Height:        Intake/Output Summary (Last 24 hours) at 08/06/2023 [phone]  Last data filed at 08/05/2023 2046 Gross per 24 hour  Intake 4171.24 ml  Output 1550 ml  Net 2621.24 ml   Filed Weights   08/04/23 1220 08/04/23 1553 08/06/23 0425  Weight: 89.9 kg 89.9 kg 93.6 kg    Physical Exam:  General: awake, alert, NAD HEENT: atraumatic, clear conjunctiva, anicteric sclera, MMM, hearing grossly normal. NG tube in place Respiratory: normal respiratory effort. CTAB Cardiovascular: quick capillary refill, normal S1/S2, RRR, no JVD, murmurs Gastrointestinal: soft, ND,  mildly tender at epigastric area Nervous: A&O x3. no gross focal neurologic deficits, normal speech Extremities: moves all equally, no edema, normal tone Skin: dry, intact, normal temperature, normal color. No rashes, lesions or ulcers on exposed skin Psychiatry: normal mood, congruent affect  Labs   I have personally reviewed the following labs and imaging studies CBC    Component Value Date/Time   WBC 5.2 08/04/2023 1319   RBC 2.39 (L) 08/04/2023 1319   HGB 7.6 (L) 08/04/2023 1319   HGB 10.2 (L) 04/26/2019 1034   HCT 21.5 (L) 08/04/2023 1319   PLT 167 08/04/2023 1319   MCV 90.0 08/04/2023 1319   MCH 31.8 08/04/2023 1319   MCHC 35.3 08/04/2023 1319   RDW 15.6 (H) 08/04/2023 1319   LYMPHSABS 1.2 04/30/2021 1344   MONOABS 0.3 04/30/2021 1344   EOSABS 0.0 04/30/2021 1344   BASOSABS 0.1 04/30/2021 1344      Latest Ref Rng & Units 08/06/2023    4:52 AM 08/05/2023    5:02 AM 08/04/2023    5:08 AM  BMP  Glucose 70 - 99 mg/dL 161  096  045   BUN 8 - 23 mg/dL 41  31  50   Creatinine 0.44 - 1.00 mg/dL 4.09  8.11  9.14   Sodium 135 - 145 mmol/L 136  130  133   Potassium 3.5 - 5.1 mmol/L 4.5  4.4  4.3   Chloride 98 - 111 mmol/L 100  96  98   CO2 22 - 32 mmol/L 26  28  24    Calcium 8.9 - 10.3 mg/dL 8.2  7.6  7.6     DG Chest Port 1 View Result Date: 08/04/2023 CLINICAL DATA:  Cough. EXAM: PORTABLE CHEST 1 VIEW COMPARISON:  Chest radiograph dated 02/24/2019 and CT dated 04/30/2021. FINDINGS: Enteric tube extends below the diaphragm with tip beyond the inferior margin of the image. An area of increased density in the right mid lung field, likely related to skin fold or atelectasis. Developing infiltrate is less likely. No pleural effusion pneumothorax. The cardiac silhouette is within normal limits. Atherosclerotic calcification of the aorta. No acute osseous pathology. IMPRESSION: 1. Enteric tube extends below the diaphragm with tip beyond the inferior margin of the image. 2. Probable skin  fold or atelectasis in the right mid lung field. Electronically Signed   By: Elgie Collard M.D.   On: 08/04/2023 14:57    Disposition Plan & Communication  Patient status: Inpatient  Admitted From: Home Planned disposition location: Home Anticipated discharge date: 3/29 pending clinical improvement. Removal of NG tube  Family Communication: none at bedside    Author: Leeroy Bock, DO [facility] 08/06/2023, 7:29 AM   Available by Epic secure chat 7AM-7PM. If 7PM-7AM, please contact night-coverage.  [facility] contact information found on [email or web address].

## 2023-08-07 DIAGNOSIS — J111 Influenza due to unidentified influenza virus with other respiratory manifestations: Secondary | ICD-10-CM

## 2023-08-07 DIAGNOSIS — E872 Acidosis, unspecified: Secondary | ICD-10-CM | POA: Diagnosis not present

## 2023-08-07 DIAGNOSIS — K8500 Idiopathic acute pancreatitis without necrosis or infection: Secondary | ICD-10-CM

## 2023-08-07 DIAGNOSIS — E875 Hyperkalemia: Secondary | ICD-10-CM | POA: Diagnosis not present

## 2023-08-07 DIAGNOSIS — N179 Acute kidney failure, unspecified: Secondary | ICD-10-CM | POA: Diagnosis not present

## 2023-08-07 DIAGNOSIS — N170 Acute kidney failure with tubular necrosis: Secondary | ICD-10-CM

## 2023-08-07 LAB — GLUCOSE, CAPILLARY
Glucose-Capillary: 146 mg/dL — ABNORMAL HIGH (ref 70–99)
Glucose-Capillary: 148 mg/dL — ABNORMAL HIGH (ref 70–99)
Glucose-Capillary: 213 mg/dL — ABNORMAL HIGH (ref 70–99)
Glucose-Capillary: 26 mg/dL — CL (ref 70–99)
Glucose-Capillary: 294 mg/dL — ABNORMAL HIGH (ref 70–99)
Glucose-Capillary: 297 mg/dL — ABNORMAL HIGH (ref 70–99)
Glucose-Capillary: 67 mg/dL — ABNORMAL LOW (ref 70–99)
Glucose-Capillary: 84 mg/dL (ref 70–99)

## 2023-08-07 LAB — RENAL FUNCTION PANEL
Albumin: 2.6 g/dL — ABNORMAL LOW (ref 3.5–5.0)
Anion gap: 12 (ref 5–15)
BUN: 44 mg/dL — ABNORMAL HIGH (ref 8–23)
CO2: 24 mmol/L (ref 22–32)
Calcium: 8.8 mg/dL — ABNORMAL LOW (ref 8.9–10.3)
Chloride: 101 mmol/L (ref 98–111)
Creatinine, Ser: 2.91 mg/dL — ABNORMAL HIGH (ref 0.44–1.00)
GFR, Estimated: 16 mL/min — ABNORMAL LOW (ref >60)
Glucose, Bld: 312 mg/dL — ABNORMAL HIGH (ref 70–99)
Phosphorus: 5 mg/dL — ABNORMAL HIGH (ref 2.5–4.6)
Potassium: 5.4 mmol/L — ABNORMAL HIGH (ref 3.5–5.1)
Sodium: 137 mmol/L (ref 135–145)

## 2023-08-07 MED ORDER — SODIUM ZIRCONIUM CYCLOSILICATE 10 G PO PACK
10.0000 g | PACK | Freq: Once | ORAL | Status: AC
Start: 2023-08-07 — End: 2023-08-07
  Administered 2023-08-07: 10 g via ORAL
  Filled 2023-08-07: qty 1

## 2023-08-07 MED ORDER — METFORMIN HCL 500 MG PO TABS: 500.0000 mg | ORAL_TABLET | Freq: Two times a day (BID) | ORAL | 0 refills | Status: AC

## 2023-08-07 MED ORDER — OXYCODONE-ACETAMINOPHEN 5-325 MG PO TABS: 1.0000 | ORAL_TABLET | Freq: Two times a day (BID) | ORAL | 0 refills | Status: AC | PRN

## 2023-08-07 MED ORDER — POLYETHYLENE GLYCOL 3350 17 G PO PACK: 17.0000 g | PACK | Freq: Two times a day (BID) | ORAL | 0 refills | Status: AC

## 2023-08-07 MED ORDER — DEXTROSE 50 % IV SOLN
1.0000 | Freq: Once | INTRAVENOUS | Status: DC
  Filled 2023-08-07: qty 50

## 2023-08-07 MED ORDER — GLUCOSE 40 % PO GEL
1.0000 | Freq: Once | ORAL | Status: AC
  Administered 2023-08-07: 31 g via ORAL
  Filled 2023-08-07: qty 1.21

## 2023-08-07 NOTE — Discharge Summary (Signed)
 Physician Discharge Summary  Patient: NELDA LUCKEY WUJ:[id number] DOB: October 11, 1945   Code Status: Limited: Do not attempt resuscitation (DNR) -DNR-LIMITED -Do Not Intubate/DNI  Admit date: 07/28/2023 Discharge date: 08/07/2023 Disposition: Home, No home health services recommended PCP: Dorothey Baseman, MD  Recommendations for Outpatient Follow-up:  Follow up with PCP within 1-2 weeks Regarding general hospital follow up and preventative care Recommend titrating back on diabetic treatments as needed Follow up with nephrology   Discharge Diagnoses:  Principal Problem:   AKI (acute kidney injury) [facility])  Brief Hospital Course Summary: Magin Balbi is a 78 year old female with a past medical history significant for diabetes mellitus on metformin and Ozempic, hypertension, hyperlipidemia, anemia, cancer who presents to [facility] ED 07/28/2023 due to complaints of abdominal pain, nausea, vomiting, diarrhea x1 week, worsening.    03/18: admitted with Acute Pancreatitis vs. Gastroenteritis, along with Acute Kidney Injury, Hyperkalemia, and severe Anion Gap Metabolic Acidosis requiring initiation of CRRT. 03/19: renal indices are improving on HD. Persistent abd pain. Initiated CLD. 3/21: unable to tolerate any PO intake, postpyloric dobhoff placed and continue to trial PO intake with supportive care.  Nephrology followed and did not show renal recovery so required HD again 3/25: diagnosed with influenza and started on tamiflu.  Her PO intake gradually improved and she was able to have NG tube removed. She had multiple BMs.  Her temp HD cath was removed and she had good urine output. May still need to initiate HD but will be done on outpatient bases and have tunneled cath placed at that time if needed.   All other chronic conditions were treated with home medications.    Discharge Condition: Good, improved Recommended discharge diet: Regular healthy diet  Consultations: [facility] Nephrology    Procedures/Studies: HD NG tube  Allergies as of 08/07/2023       Reactions   Lipitor [atorvastatin]    Severe weakness, joint pain   Pravastatin    Severe weakness, joint pain   Codeine    Morphine And Codeine    Vytorin [ezetimibe-simvastatin]         Medication List     STOP taking these medications    glimepiride 2 MG tablet Commonly known as: AMARYL   pioglitazone 30 MG tablet Commonly known as: ACTOS       TAKE these medications    amLODipine 5 MG tablet Commonly known as: NORVASC Take 1 tablet (5 mg total) by mouth at bedtime.   cholecalciferol 1000 units tablet Commonly known as: VITAMIN D Take 1,000 Units by mouth daily.   Colesevelam HCl 3.75 g Pack Take 3.75 g by mouth.   diclofenac Sodium 1 % Gel Commonly known as: VOLTAREN Apply 2 g topically 4 (four) times daily as needed (joint pain).   furosemide 40 MG tablet Commonly known as: LASIX Take 40 mg by mouth daily.   liothyronine 50 MCG tablet Commonly known as: CYTOMEL Take 50 mcg by mouth every morning.   lisinopril 20 MG tablet Commonly known as: ZESTRIL Take 20 mg by mouth daily.   metFORMIN 500 MG tablet Commonly known as: GLUCOPHAGE Take 1 tablet (500 mg total) by mouth 2 (two) times daily with a meal. What changed:  medication strength how much to take   metoprolol succinate 25 MG 24 hr tablet Commonly known as: TOPROL-XL Take 25 mg by mouth daily.   Multi-Vitamins Tabs Take 1 tablet by mouth daily.   oxyCODONE-acetaminophen 5-325 MG tablet Commonly known as: PERCOCET/ROXICET Take 1 tablet by  mouth 2 (two) times daily as needed for up to 5 days for moderate pain (pain score 4-6) or severe pain (pain score 7-10).   pantoprazole 40 MG tablet Commonly known as: PROTONIX Take 40 mg by mouth 2 (two) times daily.   polyethylene glycol 17 g packet Commonly known as: MIRALAX / GLYCOLAX Take 17 g by mouth 2 (two) times daily.   rivaroxaban 20 MG Tabs tablet Commonly  known as: XARELTO Take 20 mg by mouth daily with supper.   Synthroid 150 MCG tablet Generic drug: levothyroxine Take 150 mcg by mouth daily.       Subjective   Pt reports no abdominal pain or nausea. Tolerating diet well. Has had multiple BMs.   All questions and concerns were addressed at time of discharge.  Objective  Blood pressure (!) 140/64, pulse 81, temperature 97.8 F (36.6 C), resp. rate 16, height 5\' 7"  (1.702 m), weight 90.4 kg, SpO2 100%.   General: Pt is alert, awake, not in acute distress Cardiovascular: RRR, S1/S2 +, no rubs, no gallops Respiratory: CTA bilaterally, no wheezing, no rhonchi Abdominal: Soft, NT, ND, bowel sounds + Extremities: no edema, no cyanosis  The results of significant diagnostics from this hospitalization (including imaging, microbiology, ancillary and laboratory) are listed below for reference.   Imaging studies: DG Abd 1 View Result Date: 08/06/2023 CLINICAL DATA:  [id number] Constipation [id number] EXAM: ABDOMEN - 1 VIEW COMPARISON:  07/28/2023 FINDINGS: Enteric tube terminates within the third portion of the duodenum. Right femoral central line terminates in the right common iliac region. Nonobstructive bowel gas pattern with air-filled loops of large and small bowel. Stool is seen throughout the colon and rectum, overall mild in volume. Height loss of the L2 and L3 vertebral bodies. IMPRESSION: 1. Nonobstructive bowel gas pattern. 2. Mild stool burden. 3. Height loss of the L3 vertebral body which was not seen on the previous CT. Recommend dedicated lumbar spine radiographs. Electronically Signed   By: Duanne Guess D.O.   On: 08/06/2023 11:47   DG Chest Port 1 View Result Date: 08/04/2023 CLINICAL DATA:  Cough. EXAM: PORTABLE CHEST 1 VIEW COMPARISON:  Chest radiograph dated 02/24/2019 and CT dated 04/30/2021. FINDINGS: Enteric tube extends below the diaphragm with tip beyond the inferior margin of the image. An area of increased density in the  right mid lung field, likely related to skin fold or atelectasis. Developing infiltrate is less likely. No pleural effusion pneumothorax. The cardiac silhouette is within normal limits. Atherosclerotic calcification of the aorta. No acute osseous pathology. IMPRESSION: 1. Enteric tube extends below the diaphragm with tip beyond the inferior margin of the image. 2. Probable skin fold or atelectasis in the right mid lung field. Electronically Signed   By: Elgie Collard M.D.   On: 08/04/2023 14:57   ECHOCARDIOGRAM COMPLETE Result Date: 08/01/2023    ECHOCARDIOGRAM REPORT   Patient Name:   HOLLI RENGEL Date of Exam: 08/01/2023 Medical Rec #:  [id number]         Height:       67.0 in Accession #:    [id number]        Weight:       194.7 lb Date of Birth:  1946-01-07         BSA:          1.999 m Patient Age:    77 years          BP:           151/79 mmHg  Patient Gender: F                 HR:           82 bpm. Exam Location:  [facility] Procedure: 2D Echo, Cardiac Doppler and Color Doppler (Both Spectral and Color            Flow Doppler were utilized during procedure). Indications:     Abnormal ECG R94.31  History:         Patient has no prior history of Echocardiogram examinations.  Sonographer:     Elwin Sleight RDCS Referring Phys:  [id number] Ezequiel Essex Diagnosing Phys: Adrian Blackwater  Sonographer Comments: Image acquisition challenging due to respiratory motion. IMPRESSIONS  1. Left ventricular ejection fraction, by estimation, is 60 to 65%. The left ventricle has normal function. The left ventricle has no regional wall motion abnormalities. There is mild concentric left ventricular hypertrophy. Left ventricular diastolic parameters were normal.  2. Right ventricular systolic function is normal. The right ventricular size is normal.  3. Left atrial size was mild to moderately dilated.  4. Right atrial size was mildly dilated.  5. The mitral valve is normal in structure. Mild to moderate mitral valve regurgitation. No  evidence of mitral stenosis.  6. The aortic valve is calcified. Aortic valve regurgitation is not visualized. Aortic valve sclerosis/calcification is present, without any evidence of aortic stenosis.  7. The inferior vena cava is normal in size with greater than 50% respiratory variability, suggesting right atrial pressure of 3 mmHg. FINDINGS  Left Ventricle: Left ventricular ejection fraction, by estimation, is 60 to 65%. The left ventricle has normal function. The left ventricle has no regional wall motion abnormalities. Strain was performed and the global longitudinal strain is indeterminate. The left ventricular internal cavity size was normal in size. There is mild concentric left ventricular hypertrophy. Left ventricular diastolic parameters were normal. Right Ventricle: The right ventricular size is normal. No increase in right ventricular wall thickness. Right ventricular systolic function is normal. Left Atrium: Left atrial size was mild to moderately dilated. Right Atrium: Right atrial size was mildly dilated. Pericardium: There is no evidence of pericardial effusion. Mitral Valve: The mitral valve is normal in structure. Mild to moderate mitral valve regurgitation. No evidence of mitral valve stenosis. Tricuspid Valve: The tricuspid valve is normal in structure. Tricuspid valve regurgitation is mild . No evidence of tricuspid stenosis. Aortic Valve: The aortic valve is calcified. Aortic valve regurgitation is not visualized. Aortic valve sclerosis/calcification is present, without any evidence of aortic stenosis. Aortic valve peak gradient measures 7.0 mmHg. Pulmonic Valve: The pulmonic valve was normal in structure. Pulmonic valve regurgitation is not visualized. No evidence of pulmonic stenosis. Aorta: The aortic root is normal in size and structure. Venous: The inferior vena cava is normal in size with greater than 50% respiratory variability, suggesting right atrial pressure of 3 mmHg. IAS/Shunts: No  atrial level shunt detected by color flow Doppler. Additional Comments: 3D was performed not requiring image post processing on an independent workstation and was indeterminate.  LEFT VENTRICLE PLAX 2D LVIDd:         4.00 cm   Diastology LVIDs:         2.90 cm   LV e' medial:  9.46 cm/s LV PW:         1.10 cm   LV e' lateral: 13.70 cm/s LV IVS:        1.10 cm LVOT diam:     2.00 cm LV  SV:         62 LV SV Index:   31 LVOT Area:     3.14 cm  RIGHT VENTRICLE RV Basal diam:  3.40 cm RV S prime:     13.60 cm/s TAPSE (M-mode): 1.9 cm LEFT ATRIUM             Index        RIGHT ATRIUM           Index LA diam:        4.70 cm 2.35 cm/m   RA Area:     16.70 cm LA Vol (A2C):   59.0 ml 29.51 ml/m  RA Volume:   36.70 ml  18.36 ml/m LA Vol (A4C):   45.3 ml 22.66 ml/m LA Biplane Vol: 53.4 ml 26.71 ml/m  AORTIC VALVE                 PULMONIC VALVE AV Area (Vmax): 2.56 cm     PV Vmax:        1.06 m/s AV Vmax:        132.50 cm/s  PV Peak grad:   4.5 mmHg AV Peak Grad:   7.0 mmHg     RVOT Peak grad: 2 mmHg LVOT Vmax:      108.00 cm/s LVOT Vmean:     71.600 cm/s LVOT VTI:       0.196 m  AORTA Ao Root diam: 3.00 cm Ao Asc diam:  3.25 cm  SHUNTS Systemic VTI:  0.20 m Systemic Diam: 2.00 cm Adrian Blackwater Electronically signed by Adrian Blackwater Signature Date/Time: 08/01/2023/4:20:41 PM    Final    DG Basil Dess Tube Plc W/Fl W/Rad Result Date: 07/31/2023 INDICATION: Patient admitted with acute pancreatitis with poor oral intake. Request for post pyloric Dobhoff placement for nutritional needs. EXAM: NASO G TUBE PLACEMENT WITH FL AND WITH RAD FLUOROSCOPY TIME:  Radiation Exposure Index (as provided by the fluoroscopic device): 69.70 mGy Kerma COMPLICATIONS: None immediate PROCEDURE: The Dobhoff tube was lubricated with viscous lidocaine inserted into the right nostril. Under intermittent fluoroscopic guidance, the Dobhoff tube was advanced through the stomach and into the second portion of the duodenum. Despite several catheter  manipulations the tube was unable to be advanced to the duodenal-jejunal junction. The patient requested to terminate the exam at this time and the tube was affixed to the patient's nose with tape at 39 cm. Spot fluoroscopic images were saved for documentation purposes. The patient tolerated the procedure well without immediate postprocedural complication. IMPRESSION: Fluoroscopic-guided placement of Dobhoff tube with tip terminating within the second portion of the duodenum. The tube is ready for immediate use. This exam was performed by Lynnette Caffey, PA-C, and was supervised and interpreted by Agustin Cree, MD. Electronically Signed   By: Agustin Cree M.D.   On: 07/31/2023 15:57   CT ABDOMEN PELVIS WO CONTRAST Result Date: 07/28/2023 CLINICAL DATA:  Vomiting for 1 week. EXAM: CT ABDOMEN AND PELVIS WITHOUT CONTRAST TECHNIQUE: Multidetector CT imaging of the abdomen and pelvis was performed following the standard protocol without IV contrast. RADIATION DOSE REDUCTION: This exam was performed according to the departmental dose-optimization program which includes automated exposure control, adjustment of the mA and/or kV according to patient size and/or use of iterative reconstruction technique. COMPARISON:  April 30, 2021. FINDINGS: Lower chest: No acute abnormality. Hepatobiliary: No focal liver abnormality is seen. Status post cholecystectomy. No biliary dilatation. Pancreas: Some degree of fatty replacement of the pancreas is noted. Mild inflammatory  changes are noted around the pancreas. No definite pseudocyst formation is noted. Spleen: Normal in size without focal abnormality. Adrenals/Urinary Tract: Adrenal glands are unremarkable. Kidneys are normal, without renal calculi, focal lesion, or hydronephrosis. Bladder is unremarkable. Stomach/Bowel: Stomach is unremarkable. The appendix appears normal. There is no evidence of bowel obstruction. Colon is unremarkable. However, there is interval development of  moderate wall thickening of the duodenum and proximal jejunum with surrounding inflammatory changes. It is uncertain if this represents primary inflammation of the bowel or peptic ulcer disease, or if this is secondary to pancreatitis. Vascular/Lymphatic: Aortic atherosclerosis. No enlarged abdominal or pelvic lymph nodes. Reproductive: Uterus and bilateral adnexa are unremarkable. Other: No ascites or hernia is noted. Musculoskeletal: Stable old L2 compression fracture. No acute osseous abnormality is noted. IMPRESSION: Moderate wall thickening of the duodenum and proximal jejunum is noted with surrounding inflammatory changes. This is concerning for severe enteritis or possibly peptic ulcer disease although the possibility of this being secondary to some degree of pancreatitis cannot be excluded. Clinical correlation is recommended. Aortic Atherosclerosis (ICD10-I70.0). Electronically Signed   By: Lupita Raider M.D.   On: 07/28/2023 15:18    Labs: Basic Metabolic Panel: Recent Labs  Lab 08/02/23 0609 08/03/23 0519 08/04/23 0508 08/05/23 0502 08/06/23 0452 08/07/23 0812  NA 133* 133* 133* 130* 136 137  K 4.2 4.1 4.3 4.4 4.5 5.4*  CL 99 97* 98 96* 100 101  CO2 23 24 24 28 26 24   GLUCOSE 321* 201* 125* 270* 155* 312*  BUN 35* 43* 50* 31* 41* 44*  CREATININE 3.55* 3.59* 3.46* 2.24* 2.55* 2.91*  CALCIUM 7.6* 7.5* 7.6* 7.6* 8.2* 8.8*  MG 2.0 1.8 1.8 1.7 1.8  --   PHOS 4.1 3.6 4.0 2.8 3.9 5.0*   CBC: Recent Labs  Lab 08/01/23 0519 08/04/23 1319  WBC 9.2 5.2  HGB 10.2* 7.6*  HCT 29.5* 21.5*  MCV 89.7 90.0  PLT 172 167   Microbiology: Results for orders placed or performed during the hospital encounter of 07/28/23  MRSA Next Gen by PCR, Nasal     Status: None   Collection Time: 07/28/23  2:30 PM   Specimen: Nasal Mucosa; Nasal Swab  Result Value Ref Range Status   MRSA by PCR Next Gen NOT DETECTED NOT DETECTED Final    Comment: (NOTE) The GeneXpert MRSA Assay (FDA approved for  NASAL specimens only), is one component of a comprehensive MRSA colonization surveillance program. It is not intended to diagnose MRSA infection nor to guide or monitor treatment for MRSA infections. Test performance is not FDA approved in patients less than 63 years old. Performed at [facility], [address] [place], [place] [address]   Culture, blood (Routine X 2) w Reflex to ID Panel     Status: None   Collection Time: 07/28/23  3:32 PM   Specimen: BLOOD  Result Value Ref Range Status   Specimen Description BLOOD BLOOD LEFT WRIST  Final   Special Requests   Final    BOTTLES DRAWN AEROBIC ONLY Blood Culture results may not be optimal due to an inadequate volume of blood received in culture bottles   Culture   Final    NO GROWTH 5 DAYS Performed at [facility], [address], [place], [place] [address]    Report Status 08/02/2023 FINAL  Final  Culture, blood (Routine X 2) w Reflex to ID Panel     Status: None   Collection Time: 07/28/23  3:42 PM  Specimen: BLOOD  Result Value Ref Range Status   Specimen Description BLOOD BLOOD RIGHT HAND  Final   Special Requests   Final    BOTTLES DRAWN AEROBIC AND ANAEROBIC Blood Culture results may not be optimal due to an inadequate volume of blood received in culture bottles   Culture   Final    NO GROWTH 5 DAYS Performed at [facility], [address] [place], [place] [address]    Report Status 08/02/2023 FINAL  Final  Gastrointestinal Panel by PCR , Stool     Status: None   Collection Time: 07/28/23 10:29 PM   Specimen: Stool  Result Value Ref Range Status   Campylobacter species NOT DETECTED NOT DETECTED Corrected    Comment: CORRECTED ON 03/19 AT 0238: PREVIOUSLY REPORTED AS NONE DETECTED   Plesimonas shigelloides NOT DETECTED NOT DETECTED Corrected    Comment: CORRECTED ON 03/19 AT 0238: PREVIOUSLY REPORTED AS NONE DETECTED   Salmonella species NOT DETECTED NOT DETECTED Corrected    Comment:  CORRECTED ON 03/19 AT 0238: PREVIOUSLY REPORTED AS NONE DETECTED   Yersinia enterocolitica NOT DETECTED NOT DETECTED Corrected    Comment: CORRECTED ON 03/19 AT 0238: PREVIOUSLY REPORTED AS NONE DETECTED   Vibrio species NOT DETECTED NOT DETECTED Corrected    Comment: CORRECTED ON 03/19 AT 0238: PREVIOUSLY REPORTED AS NONE DETECTED   Vibrio cholerae NOT DETECTED NOT DETECTED Corrected    Comment: CORRECTED ON 03/19 AT 0238: PREVIOUSLY REPORTED AS NONE DETECTED   Enteroaggregative E coli (EAEC) NOT DETECTED NOT DETECTED Corrected    Comment: CORRECTED ON 03/19 AT 0238: PREVIOUSLY REPORTED AS NONE DETECTED   Enteropathogenic E coli (EPEC) NOT DETECTED NOT DETECTED Corrected    Comment: CORRECTED ON 03/19 AT 0238: PREVIOUSLY REPORTED AS NONE DETECTED   Enterotoxigenic E coli (ETEC) NOT DETECTED NOT DETECTED Corrected    Comment: CORRECTED ON 03/19 AT 0238: PREVIOUSLY REPORTED AS NONE DETECTED   Shiga like toxin producing E coli (STEC) NOT DETECTED NOT DETECTED Corrected    Comment: CORRECTED ON 03/19 AT 0238: PREVIOUSLY REPORTED AS NONE DETECTED   E. coli O157 NOT DETECTED NOT DETECTED Corrected    Comment: CORRECTED ON 03/19 AT 0238: PREVIOUSLY REPORTED AS NONE DETECTED   Shigella/Enteroinvasive E coli (EIEC) NOT DETECTED NOT DETECTED Corrected    Comment: CORRECTED ON 03/19 AT 0238: PREVIOUSLY REPORTED AS NONE DETECTED   Cryptosporidium NOT DETECTED NOT DETECTED Corrected    Comment: CORRECTED ON 03/19 AT 0238: PREVIOUSLY REPORTED AS NONE DETECTED   Cyclospora cayetanensis NOT DETECTED NOT DETECTED Corrected    Comment: CORRECTED ON 03/19 AT 0238: PREVIOUSLY REPORTED AS NONE DETECTED   Entamoeba histolytica NOT DETECTED NOT DETECTED Corrected    Comment: CORRECTED ON 03/19 AT 0238: PREVIOUSLY REPORTED AS NONE DETECTED   Giardia lamblia NOT DETECTED NOT DETECTED Corrected    Comment: CORRECTED ON 03/19 AT 0238: PREVIOUSLY REPORTED AS NONE DETECTED   Adenovirus F40/41 NOT DETECTED NOT  DETECTED Corrected    Comment: CORRECTED ON 03/19 AT 0238: PREVIOUSLY REPORTED AS NONE DETECTED   Astrovirus NOT DETECTED NOT DETECTED Corrected    Comment: CORRECTED ON 03/19 AT 0238: PREVIOUSLY REPORTED AS NONE DETECTED   Norovirus GI/GII NOT DETECTED NOT DETECTED Corrected    Comment: CORRECTED ON 03/19 AT 0238: PREVIOUSLY REPORTED AS NONE DETECTED   Rotavirus A NOT DETECTED NOT DETECTED Corrected    Comment: CORRECTED ON 03/19 AT 0238: PREVIOUSLY REPORTED AS NONE DETECTED   Sapovirus (I, II, IV, and V) NOT DETECTED NOT  DETECTED Corrected    Comment: Performed at [facility], [address] [place] [place], [place] [address] CORRECTED ON 03/19 AT 5638: PREVIOUSLY REPORTED AS NONE DETECTED   C Difficile Quick Screen w PCR reflex     Status: None   Collection Time: 07/28/23 10:29 PM   Specimen: STOOL  Result Value Ref Range Status   C Diff antigen NEGATIVE NEGATIVE Final   C Diff toxin NEGATIVE NEGATIVE Final   C Diff interpretation No C. difficile detected.  Final    Comment: Performed at [facility], [address] [place], [place] [address]  Resp panel by RT-PCR (RSV, Flu A&B, Covid) Anterior Nasal Swab     Status: Abnormal   Collection Time: 08/04/23 10:14 AM   Specimen: Anterior Nasal Swab  Result Value Ref Range Status   SARS Coronavirus 2 by RT PCR NEGATIVE NEGATIVE Final    Comment: (NOTE) SARS-CoV-2 target nucleic acids are NOT DETECTED.  The SARS-CoV-2 RNA is generally detectable in upper respiratory specimens during the acute phase of infection. The lowest concentration of SARS-CoV-2 viral copies this assay can detect is 138 copies/mL. A negative result does not preclude SARS-Cov-2 infection and should not be used as the sole basis for treatment or other patient management decisions. A negative result may occur with  improper specimen collection/handling, submission of specimen other than nasopharyngeal swab, presence of viral mutation(s) within  the areas targeted by this assay, and inadequate number of viral copies(<138 copies/mL). A negative result must be combined with clinical observations, patient history, and epidemiological information. The expected result is Negative.  Fact Sheet for Patients:  BloggerCourse.com  Fact Sheet for Healthcare Providers:  SeriousBroker.it  This test is no t yet approved or cleared by the [place] FDA and  has been authorized for detection and/or diagnosis of SARS-CoV-2 by FDA under an Emergency Use Authorization (EUA). This EUA will remain  in effect (meaning this test can be used) for the duration of the COVID-19 declaration under Section 564(b)(1) of the Act, 21 U.S.C.section 360bbb-3(b)(1), unless the authorization is terminated  or revoked sooner.       Influenza A by PCR POSITIVE (A) NEGATIVE Final   Influenza B by PCR NEGATIVE NEGATIVE Final    Comment: (NOTE) The Xpert Xpress SARS-CoV-2/FLU/RSV plus assay is intended as an aid in the diagnosis of influenza from Nasopharyngeal swab specimens and should not be used as a sole basis for treatment. Nasal washings and aspirates are unacceptable for Xpert Xpress SARS-CoV-2/FLU/RSV testing.  Fact Sheet for Patients: BloggerCourse.com  Fact Sheet for Healthcare Providers: SeriousBroker.it  This test is not yet approved or cleared by the [place] FDA and has been authorized for detection and/or diagnosis of SARS-CoV-2 by FDA under an Emergency Use Authorization (EUA). This EUA will remain in effect (meaning this test can be used) for the duration of the COVID-19 declaration under Section 564(b)(1) of the Act, 21 U.S.C. section 360bbb-3(b)(1), unless the authorization is terminated or revoked.     Resp Syncytial Virus by PCR NEGATIVE NEGATIVE Final    Comment: (NOTE) Fact Sheet for  Patients: BloggerCourse.com  Fact Sheet for Healthcare Providers: SeriousBroker.it  This test is not yet approved or cleared by the [place] FDA and has been authorized for detection and/or diagnosis of SARS-CoV-2 by FDA under an Emergency Use Authorization (EUA). This EUA will remain in effect (meaning this test can be used) for the duration of the COVID-19 declaration under Section 564(b)(1) of the Act, 21 U.S.C.  section 360bbb-3(b)(1), unless the authorization is terminated or revoked.  Performed at [facility], [address] [place], [place] [address]     Time coordinating discharge: Over 30 minutes  Leeroy Bock, MD  [facility] 08/07/2023, 1:43 PM

## 2023-08-07 NOTE — Progress Notes (Addendum)
 [facility]  ROUNDING NOTE   Subjective:    Patient seen sitting up in bed Received tube feeds overnight Appetite appropriate, states she can eat more in the evenings and mornings Continues to void frequently  03/27 0701 - 03/28 0700 In: 120 [P.O.:120] Out: 300 [Urine:300] Lab Results  Component Value Date   CREATININE 2.91 (H) 08/07/2023   CREATININE 2.55 (H) 08/06/2023   CREATININE 2.24 (H) 08/05/2023     Objective:  Vital signs in last 24 hours:  Temp:  [97.7 F (36.5 C)-97.8 F (36.6 C)] 97.8 F (36.6 C) (03/28 0725) Pulse Rate:  [77-81] 81 (03/28 0725) Resp:  [16-20] 16 (03/28 0725) BP: (126-142)/(63-68) 140/64 (03/28 0725) SpO2:  [100 %] 100 % (03/28 0725) Weight:  [90.4 kg] 90.4 kg (03/28 0500)  Weight change: -3.2 kg Filed Weights   08/04/23 1553 08/06/23 0425 08/07/23 0500  Weight: 89.9 kg 93.6 kg 90.4 kg    Intake/Output: I/O last 3 completed shifts: In: 120 [P.O.:120] Out: 600 [Urine:600]   Intake/Output this shift:  Total I/O In: 240 [P.O.:240] Out: -   Physical Exam: General: No acute distress  Head: Normocephalic, atraumatic. Moist oral mucosal membranes  Lungs:  Clear to auscultation, normal effort  Heart: S1S2 no rubs  Abdomen:  Soft, nontender, bowel sounds present  Extremities: No peripheral edema.  Neurologic: Awake, alert, following commands  Skin: No acute rash  Access: Right femoral dialysis catheter    Basic Metabolic Panel: Recent Labs  Lab 08/02/23 0609 08/03/23 0519 08/04/23 0508 08/05/23 0502 08/06/23 0452 08/07/23 0812  NA 133* 133* 133* 130* 136 137  K 4.2 4.1 4.3 4.4 4.5 5.4*  CL 99 97* 98 96* 100 101  CO2 23 24 24 28 26 24   GLUCOSE 321* 201* 125* 270* 155* 312*  BUN 35* 43* 50* 31* 41* 44*  CREATININE 3.55* 3.59* 3.46* 2.24* 2.55* 2.91*  CALCIUM 7.6* 7.5* 7.6* 7.6* 8.2* 8.8*  MG 2.0 1.8 1.8 1.7 1.8  --   PHOS 4.1 3.6 4.0 2.8 3.9 5.0*    Liver Function Tests: Recent Labs  Lab  08/07/23 0812  ALBUMIN 2.6*    No results for input(s): "LIPASE", "AMYLASE" in the last 168 hours.  No results for input(s): "AMMONIA" in the last 168 hours.  CBC: Recent Labs  Lab 08/01/23 0519 08/04/23 1319  WBC 9.2 5.2  HGB 10.2* 7.6*  HCT 29.5* 21.5*  MCV 89.7 90.0  PLT 172 167    Cardiac Enzymes: No results for input(s): "CKTOTAL", "CKMB", "CKMBINDEX", "TROPONINI" in the last 168 hours.  BNP: Invalid input(s): "POCBNP"  CBG: Recent Labs  Lab 08/06/23 2019 08/07/23 0014 08/07/23 0502 08/07/23 0847 08/07/23 1140  GLUCAP 160* 146* 213* 297* 294*    Microbiology: Results for orders placed or performed during the hospital encounter of 07/28/23  MRSA Next Gen by PCR, Nasal     Status: None   Collection Time: 07/28/23  2:30 PM   Specimen: Nasal Mucosa; Nasal Swab  Result Value Ref Range Status   MRSA by PCR Next Gen NOT DETECTED NOT DETECTED Final    Comment: (NOTE) The GeneXpert MRSA Assay (FDA approved for NASAL specimens only), is one component of a comprehensive MRSA colonization surveillance program. It is not intended to diagnose MRSA infection nor to guide or monitor treatment for MRSA infections. Test performance is not FDA approved in patients less than 75 years old. Performed at [facility], [address] [place], [place] [address]   Culture, blood (  Routine X 2) w Reflex to ID Panel     Status: None   Collection Time: 07/28/23  3:32 PM   Specimen: BLOOD  Result Value Ref Range Status   Specimen Description BLOOD BLOOD LEFT WRIST  Final   Special Requests   Final    BOTTLES DRAWN AEROBIC ONLY Blood Culture results may not be optimal due to an inadequate volume of blood received in culture bottles   Culture   Final    NO GROWTH 5 DAYS Performed at [facility], [address] [place], [place] [address]    Report Status 08/02/2023 FINAL  Final  Culture, blood (Routine X 2) w Reflex to ID Panel     Status: None    Collection Time: 07/28/23  3:42 PM   Specimen: BLOOD  Result Value Ref Range Status   Specimen Description BLOOD BLOOD RIGHT HAND  Final   Special Requests   Final    BOTTLES DRAWN AEROBIC AND ANAEROBIC Blood Culture results may not be optimal due to an inadequate volume of blood received in culture bottles   Culture   Final    NO GROWTH 5 DAYS Performed at [facility], [address] [place], [place] [address]    Report Status 08/02/2023 FINAL  Final  Gastrointestinal Panel by PCR , Stool     Status: None   Collection Time: 07/28/23 10:29 PM   Specimen: Stool  Result Value Ref Range Status   Campylobacter species NOT DETECTED NOT DETECTED Corrected    Comment: CORRECTED ON 03/19 AT 0238: PREVIOUSLY REPORTED AS NONE DETECTED   Plesimonas shigelloides NOT DETECTED NOT DETECTED Corrected    Comment: CORRECTED ON 03/19 AT 0238: PREVIOUSLY REPORTED AS NONE DETECTED   Salmonella species NOT DETECTED NOT DETECTED Corrected    Comment: CORRECTED ON 03/19 AT 0238: PREVIOUSLY REPORTED AS NONE DETECTED   Yersinia enterocolitica NOT DETECTED NOT DETECTED Corrected    Comment: CORRECTED ON 03/19 AT 0238: PREVIOUSLY REPORTED AS NONE DETECTED   Vibrio species NOT DETECTED NOT DETECTED Corrected    Comment: CORRECTED ON 03/19 AT 0238: PREVIOUSLY REPORTED AS NONE DETECTED   Vibrio cholerae NOT DETECTED NOT DETECTED Corrected    Comment: CORRECTED ON 03/19 AT 0238: PREVIOUSLY REPORTED AS NONE DETECTED   Enteroaggregative E coli (EAEC) NOT DETECTED NOT DETECTED Corrected    Comment: CORRECTED ON 03/19 AT 0238: PREVIOUSLY REPORTED AS NONE DETECTED   Enteropathogenic E coli (EPEC) NOT DETECTED NOT DETECTED Corrected    Comment: CORRECTED ON 03/19 AT 0238: PREVIOUSLY REPORTED AS NONE DETECTED   Enterotoxigenic E coli (ETEC) NOT DETECTED NOT DETECTED Corrected    Comment: CORRECTED ON 03/19 AT 0238: PREVIOUSLY REPORTED AS NONE DETECTED   Shiga like toxin producing E coli (STEC) NOT DETECTED  NOT DETECTED Corrected    Comment: CORRECTED ON 03/19 AT 0238: PREVIOUSLY REPORTED AS NONE DETECTED   E. coli O157 NOT DETECTED NOT DETECTED Corrected    Comment: CORRECTED ON 03/19 AT 0238: PREVIOUSLY REPORTED AS NONE DETECTED   Shigella/Enteroinvasive E coli (EIEC) NOT DETECTED NOT DETECTED Corrected    Comment: CORRECTED ON 03/19 AT 0238: PREVIOUSLY REPORTED AS NONE DETECTED   Cryptosporidium NOT DETECTED NOT DETECTED Corrected    Comment: CORRECTED ON 03/19 AT 0238: PREVIOUSLY REPORTED AS NONE DETECTED   Cyclospora cayetanensis NOT DETECTED NOT DETECTED Corrected    Comment: CORRECTED ON 03/19 AT 0238: PREVIOUSLY REPORTED AS NONE DETECTED   Entamoeba histolytica NOT DETECTED NOT DETECTED Corrected    Comment:  CORRECTED ON 03/19 AT 0238: PREVIOUSLY REPORTED AS NONE DETECTED   Giardia lamblia NOT DETECTED NOT DETECTED Corrected    Comment: CORRECTED ON 03/19 AT 0238: PREVIOUSLY REPORTED AS NONE DETECTED   Adenovirus F40/41 NOT DETECTED NOT DETECTED Corrected    Comment: CORRECTED ON 03/19 AT 0238: PREVIOUSLY REPORTED AS NONE DETECTED   Astrovirus NOT DETECTED NOT DETECTED Corrected    Comment: CORRECTED ON 03/19 AT 4696: PREVIOUSLY REPORTED AS NONE DETECTED   Norovirus GI/GII NOT DETECTED NOT DETECTED Corrected    Comment: CORRECTED ON 03/19 AT 0238: PREVIOUSLY REPORTED AS NONE DETECTED   Rotavirus A NOT DETECTED NOT DETECTED Corrected    Comment: CORRECTED ON 03/19 AT 0238: PREVIOUSLY REPORTED AS NONE DETECTED   Sapovirus (I, II, IV, and V) NOT DETECTED NOT DETECTED Corrected    Comment: Performed at [facility], [address] [place], [place] [address] CORRECTED ON 03/19 AT 4132: PREVIOUSLY REPORTED AS NONE DETECTED   C Difficile Quick Screen w PCR reflex     Status: None   Collection Time: 07/28/23 10:29 PM   Specimen: STOOL  Result Value Ref Range Status   C Diff antigen NEGATIVE NEGATIVE Final   C Diff toxin NEGATIVE NEGATIVE Final   C Diff interpretation No C.  difficile detected.  Final    Comment: Performed at [facility], [address] [place], [place] [address]  Resp panel by RT-PCR (RSV, Flu A&B, Covid) Anterior Nasal Swab     Status: Abnormal   Collection Time: 08/04/23 10:14 AM   Specimen: Anterior Nasal Swab  Result Value Ref Range Status   SARS Coronavirus 2 by RT PCR NEGATIVE NEGATIVE Final    Comment: (NOTE) SARS-CoV-2 target nucleic acids are NOT DETECTED.  The SARS-CoV-2 RNA is generally detectable in upper respiratory specimens during the acute phase of infection. The lowest concentration of SARS-CoV-2 viral copies this assay can detect is 138 copies/mL. A negative result does not preclude SARS-Cov-2 infection and should not be used as the sole basis for treatment or other patient management decisions. A negative result may occur with  improper specimen collection/handling, submission of specimen other than nasopharyngeal swab, presence of viral mutation(s) within the areas targeted by this assay, and inadequate number of viral copies(<138 copies/mL). A negative result must be combined with clinical observations, patient history, and epidemiological information. The expected result is Negative.  Fact Sheet for Patients:  BloggerCourse.com  Fact Sheet for Healthcare Providers:  SeriousBroker.it  This test is no t yet approved or cleared by the [place] FDA and  has been authorized for detection and/or diagnosis of SARS-CoV-2 by FDA under an Emergency Use Authorization (EUA). This EUA will remain  in effect (meaning this test can be used) for the duration of the COVID-19 declaration under Section 564(b)(1) of the Act, 21 U.S.C.section 360bbb-3(b)(1), unless the authorization is terminated  or revoked sooner.       Influenza A by PCR POSITIVE (A) NEGATIVE Final   Influenza B by PCR NEGATIVE NEGATIVE Final    Comment: (NOTE) The Xpert Xpress  SARS-CoV-2/FLU/RSV plus assay is intended as an aid in the diagnosis of influenza from Nasopharyngeal swab specimens and should not be used as a sole basis for treatment. Nasal washings and aspirates are unacceptable for Xpert Xpress SARS-CoV-2/FLU/RSV testing.  Fact Sheet for Patients: BloggerCourse.com  Fact Sheet for Healthcare Providers: SeriousBroker.it  This test is not yet approved or cleared by the [place] FDA and has been authorized for detection and/or  diagnosis of SARS-CoV-2 by FDA under an Emergency Use Authorization (EUA). This EUA will remain in effect (meaning this test can be used) for the duration of the COVID-19 declaration under Section 564(b)(1) of the Act, 21 U.S.C. section 360bbb-3(b)(1), unless the authorization is terminated or revoked.     Resp Syncytial Virus by PCR NEGATIVE NEGATIVE Final    Comment: (NOTE) Fact Sheet for Patients: BloggerCourse.com  Fact Sheet for Healthcare Providers: SeriousBroker.it  This test is not yet approved or cleared by the [place] FDA and has been authorized for detection and/or diagnosis of SARS-CoV-2 by FDA under an Emergency Use Authorization (EUA). This EUA will remain in effect (meaning this test can be used) for the duration of the COVID-19 declaration under Section 564(b)(1) of the Act, 21 U.S.C. section 360bbb-3(b)(1), unless the authorization is terminated or revoked.  Performed at [facility], [address] [place], [place] [address]     Coagulation Studies: No results for input(s): "LABPROT", "INR" in the last 72 hours.  Urinalysis: No results for input(s): "COLORURINE", "LABSPEC", "PHURINE", "GLUCOSEU", "HGBUR", "BILIRUBINUR", "KETONESUR", "PROTEINUR", "UROBILINOGEN", "NITRITE", "LEUKOCYTESUR" in the last 72 hours.  Invalid input(s): "APPERANCEUR"     Imaging: DG Abd 1  View Result Date: 08/06/2023 CLINICAL DATA:  696295 Constipation [id number] EXAM: ABDOMEN - 1 VIEW COMPARISON:  07/28/2023 FINDINGS: Enteric tube terminates within the third portion of the duodenum. Right femoral central line terminates in the right common iliac region. Nonobstructive bowel gas pattern with air-filled loops of large and small bowel. Stool is seen throughout the colon and rectum, overall mild in volume. Height loss of the L2 and L3 vertebral bodies. IMPRESSION: 1. Nonobstructive bowel gas pattern. 2. Mild stool burden. 3. Height loss of the L3 vertebral body which was not seen on the previous CT. Recommend dedicated lumbar spine radiographs. Electronically Signed   By: Duanne Guess D.O.   On: 08/06/2023 11:47      Medications:       amLODipine  5 mg Oral QHS   apixaban  5 mg Oral BID   Chlorhexidine Gluconate Cloth  6 each Topical Daily   feeding supplement (VITAL 1.5 CAL)  1,000 mL Per Tube Q24H   free water  30 mL Per Tube Q4H   insulin aspart  0-15 Units Subcutaneous TID WC   insulin aspart  3 Units Subcutaneous Q4H   lactulose  10 g Per Tube Daily   levothyroxine  150 mcg Oral Daily   liothyronine  50 mcg Oral q morning   metoprolol succinate  25 mg Oral Daily   multivitamin  1 tablet Oral QHS   oseltamivir  30 mg Oral Q24H   pantoprazole  40 mg Oral BID   polyethylene glycol  17 g Oral BID   Ensure Max Protein  11 oz Oral BID   sodium chloride flush  10-40 mL Intracatheter Q12H   sodium zirconium cyclosilicate  10 g Oral Once   guaiFENesin-dextromethorphan, ondansetron (ZOFRAN) IV, mouth rinse, oxyCODONE-acetaminophen, sodium chloride flush  Assessment/ Plan:  78 y.o. female : Pt is a 78 y.o. female with a PMHx of hypothyroidism, diabetes mellitus type 2 treated with Ozempic and metformin, hypertension, hyperlipidemia, atrial fibrillation, basal cell carcinoma, cystocele, depression, glaucoma, urinary incontinence who was admitted to [facility] on 07/28/2023 for  evaluation of nausea, vomiting, loose stools, and abdominal pain.   1.  Acute kidney injury/chronic kidney disease stage IIIb baseline EGFR 31/diabetes mellitus type 2 with chronic kidney disease/hyperkalemia.  Patient admitted with severe acute  kidney injury.  Suspect development of ATN from prolonged nausea and vomiting.  CRRT stopped 07/30/2023.  Update: Last dialysis treatment received on 08/04/2023.  Creatinine continues to rise slowly, 2.9.  Patient states she continues to have adequate urine output.  Would encourage nursing to continue to record all output.  HD temp cath to be removed today.  If further dialysis is needed, we will request tunneled access.  Patient will require outpatient follow-up at discharge to continue to monitor.  2.  Acute metabolic acidosis.  Ozempic and metformin likely playing some role here.  Corrected   3.  Pancreatitis.  Initial lipase noted to be 810.  CT scan abdomen pelvis reviewed.  Lipase may have been elevated due to renal failure.  Denies abdominal pain.  Tolerating meals.  4. Hyponatremia, sodium 137.  Nocturnal tube feeds with oral intake during the day.  5.  Hyperkalemia, potassium 5.4 today.  Likely secondary to tube feedings.  Will order Lokelma 10 g.   LOS: 10 Taim Wurm 3/28/202512:33 PM

## 2023-08-07 NOTE — TOC Progression Note (Addendum)
 Transition of Care [facility]) - Progression Note    Patient Details  Name: Katherine [name] MRN: [id number] Date of Birth: [birth year]  Transition of [facility]) CM/SW Contact  Chapman Fitch, RN Phone Number: 08/07/2023, 11:27 AM  Clinical Narrative:     Per MD will not require tube feeds at discharge No plans for further HD at this time      Update  confirmed with MD no TOC needs for discharge  Expected Discharge Plan and Services                                               Social Determinants of Health (SDOH) Interventions SDOH Screenings   Food Insecurity: No Food Insecurity (07/28/2023)  Housing: Low Risk  (07/28/2023)  Transportation Needs: No Transportation Needs (07/28/2023)  Utilities: Not At Risk (07/28/2023)  Social Connections: Moderately Isolated (07/28/2023)  Tobacco Use: Medium Risk (07/28/2023)    Readmission Risk Interventions     No data to display

## 2023-08-07 NOTE — Inpatient Diabetes Management (Signed)
 Inpatient Diabetes Program Recommendations  AACE/ADA: New Consensus Statement on Inpatient Glycemic Control (2015)  Target Ranges:  Prepandial:   less than 140 mg/dL      Peak postprandial:   less than 180 mg/dL (1-2 hours)      Critically ill patients:  140 - 180 mg/dL    Latest Reference Range & Units 08/06/23 00:02 08/06/23 04:14 08/06/23 08:59 08/06/23 11:38 08/06/23 19:01 08/06/23 20:19  Glucose-Capillary 70 - 99 mg/dL 161 (H)  8 units Novolog  140 (H)  5 units Novolog  142 (H)  5 units Novolog  184 (H)  6 units Novolog  148 (H)  5 units Novolog  160 (H)  3 units Novolog   (H): Data is abnormally high  Latest Reference Range & Units 08/07/23 00:14 08/07/23 05:02 08/07/23 08:47  Glucose-Capillary 70 - 99 mg/dL 096 (H)  3 units Novolog  213 (H)  3 units Novolog  297 (H)  11 units Novolog   (H): Data is abnormally high     Home DM Meds:  Amaryl 2 mg BID       Metformin 1000 mg BID       Actos 30 mg daily       Ozempic 0.5 mg Qweek       Current Orders: Novolog Moderate Correction Scale/ SSI (0-15 units) TID AC      Novolog 3 units Q4 hours     MD- Note pt switched to Nocturnal Tube Feeds (6pm thru 8am)  May consider adjusting the Novolog Tube feed coverage to the following schedule: Novolog 3 units 8pm, 12am, 4am, 8am only    --Will follow patient during hospitalization--  Ambrose Finland RN, MSN, CDCES Diabetes Coordinator Inpatient Glycemic Control Team Team Pager: [phone] (8a-5p)

## 2023-08-07 NOTE — Discharge Instructions (Addendum)
 Please follow up with your PCP in 1-2 weeks to discuss your hospitalization and recheck blood work for your kidneys Follow up with nephrology as they instructed you to discuss the need for further dialysis in the future. Continue to slowly advance your diet as you can and avoid use of narcotic pain medications or alcohol if possible as these will negatively impact your digestion.  Also, please review your diabetes medications. I have decreased or stopped several for now while you are recovering. I recommend your PCP review this with you and restart as neede

## 2023-08-18 LAB — MISC LABCORP TEST (SEND OUT): Labcorp test code: 9985

## 2023-09-05 ENCOUNTER — Other Ambulatory Visit: Payer: Self-pay | Admitting: Student in an Organized Health Care Education/Training Program

## 2024-03-23 ENCOUNTER — Other Ambulatory Visit: Payer: Self-pay | Admitting: Family Medicine

## 2024-03-23 DIAGNOSIS — Z1231 Encounter for screening mammogram for malignant neoplasm of breast: Secondary | ICD-10-CM

## 2024-03-24 ENCOUNTER — Ambulatory Visit
Admission: RE | Admit: 2024-03-24 | Discharge: 2024-03-24 | Disposition: A | Discharge status: Home / Self Care | Source: Ambulatory Visit | Attending: Family Medicine | Admitting: Family Medicine

## 2024-03-24 DIAGNOSIS — Z1231 Encounter for screening mammogram for malignant neoplasm of breast: Secondary | ICD-10-CM | POA: Diagnosis present
